# Patient Record
Sex: Male | Born: 1950
Health system: Southern US, Community
[De-identification: ages and names within clinical notes are randomized; demographics above are authoritative.]

## PROBLEM LIST (undated history)

## (undated) DIAGNOSIS — R0600 Dyspnea, unspecified: Secondary | ICD-10-CM

## (undated) DIAGNOSIS — Z8489 Family history of other specified conditions: Secondary | ICD-10-CM

## (undated) DIAGNOSIS — R05 Cough: Secondary | ICD-10-CM

## (undated) DIAGNOSIS — I700 Atherosclerosis of aorta: Secondary | ICD-10-CM

## (undated) DIAGNOSIS — I10 Essential (primary) hypertension: Secondary | ICD-10-CM

## (undated) DIAGNOSIS — E079 Disorder of thyroid, unspecified: Secondary | ICD-10-CM

## (undated) DIAGNOSIS — Z8719 Personal history of other diseases of the digestive system: Secondary | ICD-10-CM

## (undated) DIAGNOSIS — T7840XA Allergy, unspecified, initial encounter: Secondary | ICD-10-CM

## (undated) DIAGNOSIS — E785 Hyperlipidemia, unspecified: Secondary | ICD-10-CM

## (undated) DIAGNOSIS — F32A Depression, unspecified: Secondary | ICD-10-CM

## (undated) DIAGNOSIS — F329 Major depressive disorder, single episode, unspecified: Secondary | ICD-10-CM

## (undated) DIAGNOSIS — R059 Cough, unspecified: Secondary | ICD-10-CM

## (undated) DIAGNOSIS — I209 Angina pectoris, unspecified: Secondary | ICD-10-CM

## (undated) DIAGNOSIS — I872 Venous insufficiency (chronic) (peripheral): Secondary | ICD-10-CM

## (undated) DIAGNOSIS — Z974 Presence of external hearing-aid: Secondary | ICD-10-CM

## (undated) DIAGNOSIS — I739 Peripheral vascular disease, unspecified: Secondary | ICD-10-CM

## (undated) DIAGNOSIS — K219 Gastro-esophageal reflux disease without esophagitis: Secondary | ICD-10-CM

## (undated) DIAGNOSIS — J3089 Other allergic rhinitis: Secondary | ICD-10-CM

## (undated) DIAGNOSIS — K432 Incisional hernia without obstruction or gangrene: Secondary | ICD-10-CM

## (undated) DIAGNOSIS — H269 Unspecified cataract: Secondary | ICD-10-CM

## (undated) DIAGNOSIS — M1990 Unspecified osteoarthritis, unspecified site: Secondary | ICD-10-CM

## (undated) DIAGNOSIS — E039 Hypothyroidism, unspecified: Secondary | ICD-10-CM

## (undated) DIAGNOSIS — F419 Anxiety disorder, unspecified: Secondary | ICD-10-CM

## (undated) DIAGNOSIS — J449 Chronic obstructive pulmonary disease, unspecified: Secondary | ICD-10-CM

## (undated) DIAGNOSIS — R42 Dizziness and giddiness: Secondary | ICD-10-CM

## (undated) HISTORY — DX: Unspecified cataract: H26.9

## (undated) HISTORY — PX: HERNIA REPAIR: SHX51

## (undated) HISTORY — DX: Venous insufficiency (chronic) (peripheral): I87.2

## (undated) HISTORY — DX: Gastro-esophageal reflux disease without esophagitis: K21.9

## (undated) HISTORY — PX: CHOLECYSTECTOMY: SHX55

## (undated) HISTORY — DX: Anxiety disorder, unspecified: F41.9

## (undated) HISTORY — DX: Allergy, unspecified, initial encounter: T78.40XA

## (undated) HISTORY — DX: Hyperlipidemia, unspecified: E78.5

## (undated) HISTORY — DX: Essential (primary) hypertension: I10

## (undated) HISTORY — DX: Incisional hernia without obstruction or gangrene: K43.2

## (undated) HISTORY — DX: Atherosclerosis of aorta: I70.0

## (undated) HISTORY — DX: Depression, unspecified: F32.A

## (undated) HISTORY — PX: VEIN SURGERY: SHX48

## (undated) HISTORY — DX: Peripheral vascular disease, unspecified: I73.9

## (undated) HISTORY — DX: Disorder of thyroid, unspecified: E07.9

## (undated) HISTORY — DX: Major depressive disorder, single episode, unspecified: F32.9

## (undated) SURGERY — Surgical Case
Anesthesia: *Unknown

---

## 2004-01-28 ENCOUNTER — Ambulatory Visit: Payer: Self-pay | Admitting: Specialist

## 2004-04-08 ENCOUNTER — Ambulatory Visit: Payer: Self-pay | Admitting: *Deleted

## 2006-01-15 ENCOUNTER — Ambulatory Visit: Payer: Self-pay | Admitting: Family Medicine

## 2006-04-27 DIAGNOSIS — I2510 Atherosclerotic heart disease of native coronary artery without angina pectoris: Secondary | ICD-10-CM

## 2006-04-27 HISTORY — DX: Atherosclerotic heart disease of native coronary artery without angina pectoris: I25.10

## 2007-03-21 ENCOUNTER — Ambulatory Visit: Payer: Self-pay | Admitting: Vascular Surgery

## 2007-06-30 ENCOUNTER — Ambulatory Visit: Payer: Self-pay | Admitting: Gastroenterology

## 2008-06-19 ENCOUNTER — Ambulatory Visit: Payer: Self-pay | Admitting: Family Medicine

## 2008-09-05 ENCOUNTER — Ambulatory Visit: Payer: Self-pay | Admitting: Family Medicine

## 2008-10-30 ENCOUNTER — Ambulatory Visit: Payer: Self-pay | Admitting: Family Medicine

## 2008-11-14 ENCOUNTER — Ambulatory Visit: Payer: Self-pay | Admitting: Gastroenterology

## 2010-01-24 ENCOUNTER — Emergency Department: Payer: Self-pay | Admitting: Emergency Medicine

## 2010-07-24 ENCOUNTER — Ambulatory Visit: Payer: Self-pay | Admitting: Family Medicine

## 2012-02-28 ENCOUNTER — Emergency Department: Payer: Self-pay | Admitting: Internal Medicine

## 2013-04-27 HISTORY — PX: COLONOSCOPY: SHX5424

## 2013-08-12 ENCOUNTER — Ambulatory Visit: Payer: Self-pay | Admitting: Family Medicine

## 2015-01-16 ENCOUNTER — Ambulatory Visit (INDEPENDENT_AMBULATORY_CARE_PROVIDER_SITE_OTHER): Payer: BLUE CROSS/BLUE SHIELD | Admitting: Family Medicine

## 2015-01-16 ENCOUNTER — Encounter: Payer: Self-pay | Admitting: Family Medicine

## 2015-01-16 VITALS — BP 100/80 | HR 70 | Ht 68.0 in | Wt 153.0 lb

## 2015-01-16 DIAGNOSIS — J0100 Acute maxillary sinusitis, unspecified: Secondary | ICD-10-CM

## 2015-01-16 MED ORDER — AMOXICILLIN-POT CLAVULANATE 875-125 MG PO TABS: 1.0000 | ORAL_TABLET | Freq: Two times a day (BID) | ORAL | Status: DC

## 2015-01-16 NOTE — Progress Notes (Signed)
Name: Joel Flores   MRN: [id number]    DOB: 04-May-1950   Date:01/16/2015       Progress Note  Subjective  Chief Complaint  Chief Complaint  Patient presents with  . Sinusitis    drainage- cough productive- yellow    Sinusitis This is a chronic problem. The current episode started in the past 7 days. The problem has been gradually worsening since onset. There has been no fever. His pain is at a severity of 3/10. The pain is mild. Associated symptoms include congestion, coughing, ear pain, headaches, sinus pressure and swollen glands. Pertinent negatives include no chills, diaphoresis, neck pain, shortness of breath, sneezing or sore throat. Past treatments include acetaminophen. The treatment provided no relief.  Dizziness This is a new problem. The current episode started in the past 7 days. The problem occurs intermittently. Associated symptoms include congestion, coughing, headaches, swollen glands and vertigo. Pertinent negatives include no abdominal pain, chest pain, chills, diaphoresis, fever, myalgias, nausea, neck pain, rash or sore throat.    No problem-specific assessment & plan notes found for this encounter.   Past Medical History  Diagnosis Date  . Anxiety   . Depression   . Hyperlipidemia   . Hypertension   . Venous insufficiency   . GERD (gastroesophageal reflux disease)   . Thyroid disease     Past Surgical History  Procedure Laterality Date  . Vein surgery      2 stents in R) leg  . Colonoscopy  2015    Dr Allen Norris- cleared for 5 years     Family History  Problem Relation Age of Onset  . Diabetes Mother   . Cancer Father   . Heart disease Father   . Stroke Father     Social History   Social History  . Marital Status: Married    Spouse Name: N/A  . Number of Children: N/A  . Years of Education: N/A   Occupational History  . Not on file.   Social History Main Topics  . Smoking status: Current Every Day Smoker  . Smokeless tobacco: Not on file  .  Alcohol Use: 0.0 oz/week    0 Standard drinks or equivalent per week  . Drug Use: No  . Sexual Activity: No   Other Topics Concern  . Not on file   Social History Narrative  . No narrative on file    Allergies  Allergen Reactions  . Sulfa Antibiotics      Review of Systems  Constitutional: Negative for fever, chills, weight loss, malaise/fatigue and diaphoresis.  HENT: Positive for congestion, ear pain and sinus pressure. Negative for ear discharge, sneezing and sore throat.   Eyes: Negative for blurred vision.  Respiratory: Positive for cough. Negative for sputum production, shortness of breath and wheezing.   Cardiovascular: Negative for chest pain, palpitations and leg swelling.  Gastrointestinal: Negative for heartburn, nausea, abdominal pain, diarrhea, constipation, blood in stool and melena.  Genitourinary: Negative for dysuria, urgency, frequency and hematuria.  Musculoskeletal: Negative for myalgias, back pain, joint pain and neck pain.  Skin: Negative for rash.  Neurological: Positive for dizziness, vertigo and headaches. Negative for tingling, sensory change and focal weakness.  Endo/Heme/Allergies: Negative for environmental allergies and polydipsia. Does not bruise/bleed easily.  Psychiatric/Behavioral: Negative for depression and suicidal ideas. The patient is not nervous/anxious and does not have insomnia.      Objective  Filed Vitals:   01/16/15 1029  BP: 100/80  Pulse: 70  Height: 5\' 8"  (  1.727 m)  Weight: 153 lb (69.4 kg)    Physical Exam  Constitutional: He is oriented to person, place, and time and well-developed, well-nourished, and in no distress.  HENT:  Head: Normocephalic.  Right Ear: External ear normal.  Left Ear: External ear normal.  Nose: Nose normal.  Mouth/Throat: Oropharynx is clear and moist.  Eyes: Conjunctivae and EOM are normal. Pupils are equal, round, and reactive to light. Right eye exhibits no discharge. Left eye exhibits no  discharge. No scleral icterus.  Neck: Normal range of motion. Neck supple. No JVD present. No tracheal deviation present. No thyromegaly present.  Cardiovascular: Normal rate, regular rhythm, normal heart sounds and intact distal pulses.  Exam reveals no gallop and no friction rub.   No murmur heard. Pulmonary/Chest: Breath sounds normal. No respiratory distress. He has no wheezes. He has no rales.  Abdominal: Soft. Bowel sounds are normal. He exhibits no mass. There is no hepatosplenomegaly. There is no tenderness. There is no rebound, no guarding and no CVA tenderness.  Musculoskeletal: Normal range of motion. He exhibits no edema or tenderness.  Lymphadenopathy:    He has no cervical adenopathy.  Neurological: He is alert and oriented to person, place, and time. He has normal sensation, normal strength, normal reflexes and intact cranial nerves. No cranial nerve deficit.  Skin: Skin is warm. No rash noted.  Psychiatric: Mood and affect normal.  Nursing note and vitals reviewed.     Assessment & Plan  Problem List Items Addressed This Visit    None    Visit Diagnoses    Acute maxillary sinusitis, recurrence not specified    -  Primary    Relevant Medications    loratadine (CLARITIN) 10 MG tablet    amoxicillin-clavulanate (AUGMENTIN) 875-125 MG per tablet         Dr. [facility]  01/16/2015

## 2015-01-31 ENCOUNTER — Other Ambulatory Visit: Payer: Self-pay

## 2015-01-31 DIAGNOSIS — J40 Bronchitis, not specified as acute or chronic: Secondary | ICD-10-CM

## 2015-01-31 MED ORDER — LEVOFLOXACIN 500 MG PO TABS: 500.0000 mg | ORAL_TABLET | Freq: Every day | ORAL | Status: DC

## 2015-02-12 ENCOUNTER — Encounter: Payer: Self-pay | Admitting: Family Medicine

## 2015-02-12 ENCOUNTER — Ambulatory Visit (INDEPENDENT_AMBULATORY_CARE_PROVIDER_SITE_OTHER): Payer: BLUE CROSS/BLUE SHIELD | Admitting: Family Medicine

## 2015-02-12 VITALS — BP 120/84 | HR 86 | Ht 68.0 in | Wt 174.0 lb

## 2015-02-12 DIAGNOSIS — I10 Essential (primary) hypertension: Secondary | ICD-10-CM | POA: Diagnosis not present

## 2015-02-12 DIAGNOSIS — I700 Atherosclerosis of aorta: Secondary | ICD-10-CM

## 2015-02-12 DIAGNOSIS — K219 Gastro-esophageal reflux disease without esophagitis: Secondary | ICD-10-CM | POA: Diagnosis not present

## 2015-02-12 DIAGNOSIS — F329 Major depressive disorder, single episode, unspecified: Secondary | ICD-10-CM

## 2015-02-12 DIAGNOSIS — E785 Hyperlipidemia, unspecified: Secondary | ICD-10-CM

## 2015-02-12 DIAGNOSIS — E039 Hypothyroidism, unspecified: Secondary | ICD-10-CM | POA: Diagnosis not present

## 2015-02-12 DIAGNOSIS — F32A Depression, unspecified: Secondary | ICD-10-CM | POA: Insufficient documentation

## 2015-02-12 DIAGNOSIS — F419 Anxiety disorder, unspecified: Secondary | ICD-10-CM | POA: Insufficient documentation

## 2015-02-12 HISTORY — DX: Atherosclerosis of aorta: I70.0

## 2015-02-12 HISTORY — DX: Essential (primary) hypertension: I10

## 2015-02-12 MED ORDER — LEVOTHYROXINE SODIUM 100 MCG PO TABS: 100.0000 ug | ORAL_TABLET | Freq: Every day | ORAL | Status: DC

## 2015-02-12 MED ORDER — LOSARTAN POTASSIUM 50 MG PO TABS: 50.0000 mg | ORAL_TABLET | Freq: Every day | ORAL | Status: DC

## 2015-02-12 MED ORDER — FENOFIBRIC ACID 135 MG PO CPDR: 1.0000 | DELAYED_RELEASE_CAPSULE | Freq: Every day | ORAL | Status: DC

## 2015-02-12 MED ORDER — CLOPIDOGREL BISULFATE 75 MG PO TABS: 75.0000 mg | ORAL_TABLET | Freq: Every day | ORAL | Status: DC

## 2015-02-12 MED ORDER — SERTRALINE HCL 100 MG PO TABS: 100.0000 mg | ORAL_TABLET | Freq: Every day | ORAL | Status: DC

## 2015-02-12 MED ORDER — SIMVASTATIN 80 MG PO TABS: 80.0000 mg | ORAL_TABLET | Freq: Every day | ORAL | Status: DC

## 2015-02-12 MED ORDER — RABEPRAZOLE SODIUM 20 MG PO TBEC: 20.0000 mg | DELAYED_RELEASE_TABLET | Freq: Every day | ORAL | Status: DC

## 2015-02-12 MED ORDER — CLONAZEPAM 0.5 MG PO TABS: 0.5000 mg | ORAL_TABLET | Freq: Every day | ORAL | Status: DC

## 2015-02-12 NOTE — Progress Notes (Signed)
Name: Joel Flores   MRN: [id number]    DOB: 09-07-1950   Date:02/12/2015       Progress Note  Subjective  Chief Complaint  Chief Complaint  Patient presents with  . Depression  . Anxiety  . Hypertension  . Hyperlipidemia  . Gastroesophageal Reflux  . Allergic Rhinitis   . Hypothyroidism    Depression      The patient presents with depression.  This is a chronic problem.  The current episode started more than 1 year ago.   The onset quality is gradual.   The problem occurs daily.  The problem has been gradually improving since onset.  Associated symptoms include no decreased concentration, no fatigue, no helplessness, no hopelessness, does not have insomnia, not irritable, no restlessness, no decreased interest, no appetite change, no body aches, no myalgias, no headaches, no indigestion, not sad and no suicidal ideas.     The symptoms are aggravated by nothing.  Past treatments include SSRIs - Selective serotonin reuptake inhibitors.  Compliance with treatment is good.  Past medical history includes thyroid problem, anxiety and depression.     Pertinent negatives include no chronic fatigue syndrome and no suicide attempts. Anxiety Presents for follow-up visit. Symptoms include nervous/anxious behavior. Patient reports no chest pain, decreased concentration, depressed mood, dizziness, excessive worry, hyperventilation, insomnia, nausea, palpitations, restlessness, shortness of breath or suicidal ideas. Symptoms occur occasionally. The severity of symptoms is mild. Nothing aggravates the symptoms.   His past medical history is significant for anxiety/panic attacks and depression. There is no history of anemia, arrhythmia, asthma, bipolar disorder, CAD, CHF, chronic lung disease, fibromyalgia, hyperthyroidism or suicide attempts. Past treatments include benzodiazephines and SSRIs.  Hypertension This is a recurrent problem. The current episode started more than 1 year ago. The problem has been  waxing and waning since onset. The problem is controlled. Associated symptoms include anxiety. Pertinent negatives include no blurred vision, chest pain, headaches, malaise/fatigue, neck pain, orthopnea, palpitations, peripheral edema, PND, shortness of breath or sweats. There are no associated agents to hypertension. Past treatments include angiotensin blockers. The current treatment provides no improvement. There are no compliance problems.  Hypertensive end-organ damage includes a thyroid problem. There is no history of angina, kidney disease, CAD/MI, CVA, heart failure, left ventricular hypertrophy, PVD, renovascular disease or retinopathy. There is no history of chronic renal disease.  Hyperlipidemia This is a recurrent problem. The current episode started more than 1 month ago. The problem is controlled. Recent lipid tests were reviewed and are normal. He has no history of chronic renal disease or diabetes. Pertinent negatives include no chest pain, focal sensory loss, focal weakness, leg pain, myalgias or shortness of breath. Current antihyperlipidemic treatment includes statins. The current treatment provides mild improvement of lipids. There are no compliance problems.   Gastroesophageal Reflux He reports no abdominal pain, no belching, no chest pain, no choking, no coughing, no dysphagia, no heartburn, no hoarse voice, no nausea, no sore throat or no wheezing. This is a recurrent problem. The current episode started more than 1 year ago. The problem occurs frequently. The problem has been gradually improving. The symptoms are aggravated by certain foods. Pertinent negatives include no anemia, fatigue, melena, muscle weakness, orthopnea or weight loss. He has tried a PPI for the symptoms. The treatment provided moderate relief.  Thyroid Problem Presents for follow-up visit. Symptoms include anxiety. Patient reports no cold intolerance, constipation, depressed mood, diaphoresis, diarrhea, dry skin,  fatigue, hair loss, heat intolerance, hoarse voice, leg swelling,  menstrual problem, nail problem, palpitations, tremors, visual change, weight gain or weight loss. The symptoms have been stable. Past treatments include levothyroxine. The treatment provided mild relief. His past medical history is significant for hyperlipidemia and obesity. There is no history of atrial fibrillation, dementia, diabetes, heart failure or neuropathy.    No problem-specific assessment & plan notes found for this encounter.   Past Medical History  Diagnosis Date  . Anxiety   . Depression   . Hyperlipidemia   . Hypertension   . Venous insufficiency   . GERD (gastroesophageal reflux disease)   . Thyroid disease     Past Surgical History  Procedure Laterality Date  . Vein surgery      2 stents in R) leg  . Colonoscopy  2015    Dr Allen Norris- cleared for 5 years     Family History  Problem Relation Age of Onset  . Diabetes Mother   . Cancer Father   . Heart disease Father   . Stroke Father     Social History   Social History  . Marital Status: Married    Spouse Name: N/A  . Number of Children: N/A  . Years of Education: N/A   Occupational History  . Not on file.   Social History Main Topics  . Smoking status: Current Every Day Smoker  . Smokeless tobacco: Not on file  . Alcohol Use: 0.0 oz/week    0 Standard drinks or equivalent per week  . Drug Use: No  . Sexual Activity: No   Other Topics Concern  . Not on file   Social History Narrative    Allergies  Allergen Reactions  . Sulfa Antibiotics      Review of Systems  Constitutional: Negative for fever, chills, weight loss, weight gain, malaise/fatigue, diaphoresis, appetite change and fatigue.  HENT: Negative for ear discharge, ear pain, hoarse voice and sore throat.   Eyes: Negative for blurred vision.  Respiratory: Negative for cough, sputum production, choking, shortness of breath and wheezing.   Cardiovascular: Negative for  chest pain, palpitations, orthopnea, leg swelling and PND.  Gastrointestinal: Negative for heartburn, dysphagia, nausea, abdominal pain, diarrhea, constipation, blood in stool and melena.  Genitourinary: Negative for dysuria, urgency, frequency, hematuria and menstrual problem.  Musculoskeletal: Negative for myalgias, back pain, joint pain, muscle weakness and neck pain.  Skin: Negative for rash.  Neurological: Negative for dizziness, tingling, tremors, sensory change, focal weakness and headaches.  Endo/Heme/Allergies: Negative for environmental allergies, cold intolerance, heat intolerance and polydipsia. Does not bruise/bleed easily.  Psychiatric/Behavioral: Positive for depression. Negative for suicidal ideas and decreased concentration. The patient is nervous/anxious. The patient does not have insomnia.      Objective  Filed Vitals:   02/12/15 1346  BP: 120/84  Pulse: 86  Height: 5\' 8"  (1.727 m)  Weight: 174 lb (78.926 kg)    Physical Exam  Constitutional: He is oriented to person, place, and time and well-developed, well-nourished, and in no distress. He is not irritable.  HENT:  Head: Normocephalic.  Right Ear: External ear normal.  Left Ear: External ear normal.  Nose: Nose normal.  Mouth/Throat: Oropharynx is clear and moist.  Eyes: Conjunctivae and EOM are normal. Pupils are equal, round, and reactive to light. Right eye exhibits no discharge. Left eye exhibits no discharge. No scleral icterus.  Neck: Normal range of motion. Neck supple. No JVD present. No tracheal deviation present. No thyromegaly present.  Cardiovascular: Normal rate, regular rhythm, normal heart sounds and intact distal  pulses.  Exam reveals no gallop and no friction rub.   No murmur heard. Pulmonary/Chest: Breath sounds normal. No respiratory distress. He has no wheezes. He has no rales.  Abdominal: Soft. Bowel sounds are normal. He exhibits no mass. There is no hepatosplenomegaly. There is no  tenderness. There is no rebound, no guarding and no CVA tenderness.  Musculoskeletal: Normal range of motion. He exhibits no edema or tenderness.  Lymphadenopathy:    He has no cervical adenopathy.  Neurological: He is alert and oriented to person, place, and time. He has normal sensation, normal strength, normal reflexes and intact cranial nerves. No cranial nerve deficit.  Skin: Skin is warm. No rash noted.  Psychiatric: Mood and affect normal.      Assessment & Plan  Problem List Items Addressed This Visit      Cardiovascular and Mediastinum   Atherosclerosis of aorta (HCC)   Relevant Medications   clopidogrel (PLAVIX) 75 MG tablet   losartan (COZAAR) 50 MG tablet   simvastatin (ZOCOR) 80 MG tablet   Essential hypertension - Primary   Relevant Medications   losartan (COZAAR) 50 MG tablet   simvastatin (ZOCOR) 80 MG tablet   Other Relevant Orders   Renal Function Panel     Digestive   Esophageal reflux   Relevant Medications   RABEprazole (ACIPHEX) 20 MG tablet     Endocrine   Thyroid activity decreased   Relevant Medications   levothyroxine (SYNTHROID, LEVOTHROID) 100 MCG tablet   Other Relevant Orders   TSH     Other   Depression   Relevant Medications   sertraline (ZOLOFT) 100 MG tablet   Chronic anxiety   Relevant Medications   clonazePAM (KLONOPIN) 0.5 MG tablet   sertraline (ZOLOFT) 100 MG tablet   Hyperlipemia   Relevant Medications   losartan (COZAAR) 50 MG tablet   simvastatin (ZOCOR) 80 MG tablet   Other Relevant Orders   Lipid Profile   TSH        Dr. Deanna Jones [facility]  02/12/2015

## 2015-02-13 LAB — RENAL FUNCTION PANEL
ALBUMIN: 4.3 g/dL (ref 3.6–4.8)
BUN/Creatinine Ratio: 14 (ref 10–22)
BUN: 13 mg/dL (ref 8–27)
CALCIUM: 9.8 mg/dL (ref 8.6–10.2)
CHLORIDE: 103 mmol/L (ref 97–106)
CO2: 24 mmol/L (ref 18–29)
Creatinine, Ser: 0.96 mg/dL (ref 0.76–1.27)
GFR calc Af Amer: 96 mL/min/{1.73_m2} (ref >59)
GFR calc non Af Amer: 83 mL/min/{1.73_m2} (ref >59)
GLUCOSE: 78 mg/dL (ref 65–99)
PHOSPHORUS: 2.9 mg/dL (ref 2.5–4.5)
POTASSIUM: 4.9 mmol/L (ref 3.5–5.2)
Sodium: 141 mmol/L (ref 136–144)

## 2015-02-13 LAB — LIPID PANEL
CHOLESTEROL TOTAL: 160 mg/dL (ref 100–199)
Chol/HDL Ratio: 4.1 ratio units (ref 0.0–5.0)
HDL: 39 mg/dL — AB (ref >39)
LDL Calculated: 101 mg/dL — ABNORMAL HIGH (ref 0–99)
TRIGLYCERIDES: 98 mg/dL (ref 0–149)
VLDL CHOLESTEROL CAL: 20 mg/dL (ref 5–40)

## 2015-02-13 LAB — TSH: TSH: 3 u[IU]/mL (ref 0.450–4.500)

## 2015-02-19 ENCOUNTER — Other Ambulatory Visit: Payer: Self-pay

## 2015-02-19 DIAGNOSIS — F32A Depression, unspecified: Secondary | ICD-10-CM

## 2015-02-19 DIAGNOSIS — F329 Major depressive disorder, single episode, unspecified: Secondary | ICD-10-CM

## 2015-02-19 MED ORDER — SERTRALINE HCL 100 MG PO TABS: 100.0000 mg | ORAL_TABLET | Freq: Every day | ORAL | Status: DC

## 2015-05-16 ENCOUNTER — Other Ambulatory Visit: Payer: Self-pay

## 2015-05-16 DIAGNOSIS — F329 Major depressive disorder, single episode, unspecified: Secondary | ICD-10-CM

## 2015-05-16 DIAGNOSIS — K219 Gastro-esophageal reflux disease without esophagitis: Secondary | ICD-10-CM

## 2015-05-16 DIAGNOSIS — F32A Depression, unspecified: Secondary | ICD-10-CM

## 2015-05-16 MED ORDER — RABEPRAZOLE SODIUM 20 MG PO TBEC: 20.0000 mg | DELAYED_RELEASE_TABLET | Freq: Every day | ORAL | Status: DC

## 2015-05-16 MED ORDER — SERTRALINE HCL 100 MG PO TABS: 100.0000 mg | ORAL_TABLET | Freq: Every day | ORAL | Status: DC

## 2015-05-20 ENCOUNTER — Other Ambulatory Visit: Payer: Self-pay

## 2015-06-06 ENCOUNTER — Other Ambulatory Visit: Payer: Self-pay

## 2015-06-06 DIAGNOSIS — K219 Gastro-esophageal reflux disease without esophagitis: Secondary | ICD-10-CM

## 2015-06-06 MED ORDER — RABEPRAZOLE SODIUM 20 MG PO TBEC
20.0000 mg | DELAYED_RELEASE_TABLET | Freq: Every day | ORAL | Status: DC
Start: 2015-06-06 — End: 2015-06-10

## 2015-06-10 ENCOUNTER — Other Ambulatory Visit: Payer: Self-pay

## 2015-06-10 DIAGNOSIS — K219 Gastro-esophageal reflux disease without esophagitis: Secondary | ICD-10-CM

## 2015-06-10 MED ORDER — DEXLANSOPRAZOLE 60 MG PO CPDR: 60.0000 mg | DELAYED_RELEASE_CAPSULE | Freq: Every day | ORAL | Status: DC

## 2015-07-05 ENCOUNTER — Other Ambulatory Visit: Payer: Self-pay | Admitting: Family Medicine

## 2015-07-09 ENCOUNTER — Other Ambulatory Visit: Payer: Self-pay

## 2015-07-09 DIAGNOSIS — K219 Gastro-esophageal reflux disease without esophagitis: Secondary | ICD-10-CM

## 2015-07-09 MED ORDER — DEXLANSOPRAZOLE 60 MG PO CPDR: 60.0000 mg | DELAYED_RELEASE_CAPSULE | Freq: Every day | ORAL | Status: DC

## 2015-07-10 ENCOUNTER — Other Ambulatory Visit: Payer: Self-pay

## 2015-07-11 ENCOUNTER — Other Ambulatory Visit: Payer: Self-pay

## 2015-07-11 ENCOUNTER — Other Ambulatory Visit: Payer: Self-pay | Admitting: Family Medicine

## 2015-08-06 ENCOUNTER — Other Ambulatory Visit: Payer: Self-pay | Admitting: Family Medicine

## 2015-08-12 ENCOUNTER — Other Ambulatory Visit: Payer: Self-pay

## 2015-08-13 ENCOUNTER — Ambulatory Visit (INDEPENDENT_AMBULATORY_CARE_PROVIDER_SITE_OTHER): Payer: Medicare Other | Admitting: Family Medicine

## 2015-08-13 ENCOUNTER — Encounter: Payer: Self-pay | Admitting: Family Medicine

## 2015-08-13 VITALS — BP 120/80 | HR 82 | Ht 68.0 in | Wt 176.0 lb

## 2015-08-13 DIAGNOSIS — I10 Essential (primary) hypertension: Secondary | ICD-10-CM | POA: Diagnosis not present

## 2015-08-13 DIAGNOSIS — R351 Nocturia: Secondary | ICD-10-CM

## 2015-08-13 DIAGNOSIS — K219 Gastro-esophageal reflux disease without esophagitis: Secondary | ICD-10-CM

## 2015-08-13 DIAGNOSIS — Z1211 Encounter for screening for malignant neoplasm of colon: Secondary | ICD-10-CM | POA: Diagnosis not present

## 2015-08-13 DIAGNOSIS — F419 Anxiety disorder, unspecified: Secondary | ICD-10-CM

## 2015-08-13 DIAGNOSIS — E039 Hypothyroidism, unspecified: Secondary | ICD-10-CM | POA: Diagnosis not present

## 2015-08-13 DIAGNOSIS — F329 Major depressive disorder, single episode, unspecified: Secondary | ICD-10-CM

## 2015-08-13 DIAGNOSIS — I700 Atherosclerosis of aorta: Secondary | ICD-10-CM

## 2015-08-13 DIAGNOSIS — E785 Hyperlipidemia, unspecified: Secondary | ICD-10-CM

## 2015-08-13 DIAGNOSIS — J301 Allergic rhinitis due to pollen: Secondary | ICD-10-CM | POA: Insufficient documentation

## 2015-08-13 DIAGNOSIS — F32A Depression, unspecified: Secondary | ICD-10-CM

## 2015-08-13 LAB — HEMOCCULT GUIAC POC 1CARD (OFFICE): Fecal Occult Blood, POC: NEGATIVE

## 2015-08-13 MED ORDER — CLONAZEPAM 0.5 MG PO TABS: 0.5000 mg | ORAL_TABLET | Freq: Every day | ORAL | Status: DC

## 2015-08-13 MED ORDER — LEVOTHYROXINE SODIUM 100 MCG PO TABS: ORAL_TABLET | ORAL | Status: DC

## 2015-08-13 MED ORDER — DEXLANSOPRAZOLE 60 MG PO CPDR: DELAYED_RELEASE_CAPSULE | ORAL | Status: DC

## 2015-08-13 MED ORDER — SERTRALINE HCL 100 MG PO TABS
ORAL_TABLET | ORAL | Status: DC
Start: 2015-08-13 — End: 2016-01-07

## 2015-08-13 MED ORDER — SIMVASTATIN 80 MG PO TABS: ORAL_TABLET | ORAL | Status: DC

## 2015-08-13 MED ORDER — CLOPIDOGREL BISULFATE 75 MG PO TABS: ORAL_TABLET | ORAL | Status: DC

## 2015-08-13 MED ORDER — LOSARTAN POTASSIUM 50 MG PO TABS: ORAL_TABLET | ORAL | Status: DC

## 2015-08-13 MED ORDER — FENOFIBRIC ACID 135 MG PO CPDR: DELAYED_RELEASE_CAPSULE | ORAL | Status: DC

## 2015-08-13 MED ORDER — LORATADINE 10 MG PO TABS: 10.0000 mg | ORAL_TABLET | Freq: Every day | ORAL | Status: DC

## 2015-08-13 NOTE — Progress Notes (Signed)
Name: Joel Flores   MRN: [id number]    DOB: 12-10-1950   Date:08/13/2015       Progress Note  Subjective  Chief Complaint  Chief Complaint  Patient presents with  . Gastroesophageal Reflux  . Hyperlipidemia  . Anxiety  . Hypothyroidism  . Allergic Rhinitis   . Hypertension  . Depression    Gastroesophageal Reflux He reports no abdominal pain, no belching, no chest pain, no choking, no coughing, no dysphagia, no early satiety, no globus sensation, no heartburn, no hoarse voice, no nausea, no sore throat, no stridor, no tooth decay, no water brash or no wheezing. This is a new problem. The current episode started more than 1 year ago. The problem occurs frequently. The problem has been unchanged. The symptoms are aggravated by certain foods. Pertinent negatives include no anemia, fatigue, melena, muscle weakness, orthopnea or weight loss. There are no known risk factors. He has tried a PPI for the symptoms. The treatment provided mild relief.  Hyperlipidemia This is a chronic problem. The current episode started more than 1 year ago. The problem is controlled. Recent lipid tests were reviewed and are normal. He has no history of chronic renal disease, diabetes, hypothyroidism, liver disease, obesity or nephrotic syndrome. There are no known factors aggravating his hyperlipidemia. Pertinent negatives include no chest pain, focal sensory loss, focal weakness, leg pain, myalgias or shortness of breath. Current antihyperlipidemic treatment includes statins and fibric acid derivatives. The current treatment provides mild improvement of lipids. There are no compliance problems.  Risk factors for coronary artery disease include dyslipidemia, male sex and hypertension.  Anxiety Presents for follow-up visit. The problem has been gradually improving. Symptoms include panic. Patient reports no chest pain, compulsions, confusion, decreased concentration, depressed mood, dizziness, dry mouth, excessive worry,  feeling of choking, hyperventilation, impotence, insomnia, irritability, malaise, muscle tension, nausea, nervous/anxious behavior, obsessions, palpitations, restlessness, shortness of breath or suicidal ideas. Symptoms occur most days. The severity of symptoms is mild. Exacerbated by: people doing stupid things. The quality of sleep is good.   His past medical history is significant for anxiety/panic attacks. Past treatments include SSRIs and benzodiazephines. The treatment provided mild relief.  Hypertension This is a chronic problem. The problem has been gradually improving since onset. The problem is controlled. Associated symptoms include anxiety. Pertinent negatives include no blurred vision, chest pain, headaches, malaise/fatigue, neck pain, orthopnea, palpitations, peripheral edema, PND, shortness of breath or sweats. Past treatments include angiotensin blockers. The current treatment provides mild improvement. There are no compliance problems.  There is no history of angina, kidney disease, CAD/MI, CVA, heart failure, left ventricular hypertrophy, PVD, renovascular disease or retinopathy. There is no history of chronic renal disease or a hypertension causing med.  Depression        This is a chronic problem.  The current episode started more than 1 year ago.   The onset quality is gradual.   The problem has been gradually improving since onset.  Associated symptoms include irritable.  Associated symptoms include no decreased concentration, no fatigue, no helplessness, no hopelessness, does not have insomnia, no restlessness, no decreased interest, no appetite change, no body aches, no myalgias, no headaches, no indigestion, not sad and no suicidal ideas.  Past treatments include SSRIs - Selective serotonin reuptake inhibitors.  Past medical history includes anxiety.     Pertinent negatives include no hypothyroidism.   No problem-specific assessment & plan notes found for this encounter.   Past  Medical History  Diagnosis Date  .  Anxiety   . Depression   . Hyperlipidemia   . Hypertension   . Venous insufficiency   . GERD (gastroesophageal reflux disease)   . Thyroid disease     Past Surgical History  Procedure Laterality Date  . Vein surgery      2 stents in R) leg  . Colonoscopy  2015    Dr Allen Norris- cleared for 5 years     Family History  Problem Relation Age of Onset  . Diabetes Mother   . Cancer Father   . Heart disease Father   . Stroke Father     Social History   Social History  . Marital Status: Married    Spouse Name: N/A  . Number of Children: N/A  . Years of Education: N/A   Occupational History  . Not on file.   Social History Main Topics  . Smoking status: Current Every Day Smoker  . Smokeless tobacco: Not on file  . Alcohol Use: 0.0 oz/week    0 Standard drinks or equivalent per week  . Drug Use: No  . Sexual Activity: No   Other Topics Concern  . Not on file   Social History Narrative    Allergies  Allergen Reactions  . Sulfa Antibiotics      Review of Systems  Constitutional: Negative for fever, chills, weight loss, malaise/fatigue, appetite change, irritability and fatigue.  HENT: Negative for ear discharge, ear pain, hoarse voice and sore throat.   Eyes: Negative for blurred vision.  Respiratory: Negative for cough, sputum production, choking, shortness of breath and wheezing.   Cardiovascular: Negative for chest pain, palpitations, orthopnea, leg swelling and PND.  Gastrointestinal: Negative for heartburn, dysphagia, nausea, abdominal pain, diarrhea, constipation, blood in stool and melena.  Genitourinary: Negative for dysuria, urgency, frequency, hematuria and impotence.  Musculoskeletal: Negative for myalgias, back pain, joint pain, muscle weakness and neck pain.  Skin: Negative for rash.  Neurological: Negative for dizziness, tingling, sensory change, focal weakness and headaches.  Endo/Heme/Allergies: Negative for  environmental allergies and polydipsia. Does not bruise/bleed easily.  Psychiatric/Behavioral: Positive for depression. Negative for suicidal ideas, confusion and decreased concentration. The patient is not nervous/anxious and does not have insomnia.      Objective  Filed Vitals:   08/13/15 0843  BP: 120/80  Pulse: 82  Height: 5\' 8"  (1.727 m)  Weight: 176 lb (79.833 kg)    Physical Exam  Constitutional: He is oriented to person, place, and time and well-developed, well-nourished, and in no distress. He is irritable.  HENT:  Head: Normocephalic.  Right Ear: External ear normal.  Left Ear: External ear normal.  Nose: Nose normal.  Mouth/Throat: Oropharynx is clear and moist.  Eyes: Conjunctivae and EOM are normal. Pupils are equal, round, and reactive to light. Right eye exhibits no discharge. Left eye exhibits no discharge. No scleral icterus.  Neck: Normal range of motion. Neck supple. No JVD present. No tracheal deviation present. No thyromegaly present.  Cardiovascular: Normal rate, regular rhythm, normal heart sounds and intact distal pulses.  Exam reveals no gallop and no friction rub.   No murmur heard. Pulmonary/Chest: Breath sounds normal. No respiratory distress. He has no wheezes. He has no rales.  Abdominal: Soft. Bowel sounds are normal. He exhibits no mass. There is no hepatosplenomegaly. There is no tenderness. There is no rebound, no guarding and no CVA tenderness.  Genitourinary: Rectum normal and prostate normal.  Musculoskeletal: Normal range of motion. He exhibits no edema or tenderness.  Lymphadenopathy:  He has no cervical adenopathy.  Neurological: He is alert and oriented to person, place, and time. He has normal sensation, normal strength, normal reflexes and intact cranial nerves. No cranial nerve deficit.  Skin: Skin is warm. No rash noted.  Psychiatric: Mood and affect normal.  Nursing note and vitals reviewed.     Assessment & Plan  Problem List  Items Addressed This Visit      Cardiovascular and Mediastinum   Atherosclerosis of aorta (HCC)   Relevant Medications   Choline Fenofibrate (FENOFIBRIC ACID) 135 MG CPDR   clopidogrel (PLAVIX) 75 MG tablet   losartan (COZAAR) 50 MG tablet   simvastatin (ZOCOR) 80 MG tablet   Essential hypertension - Primary   Relevant Medications   Choline Fenofibrate (FENOFIBRIC ACID) 135 MG CPDR   losartan (COZAAR) 50 MG tablet   simvastatin (ZOCOR) 80 MG tablet   Other Relevant Orders   Renal function panel     Respiratory   Allergic rhinitis due to pollen   Relevant Medications   loratadine (CLARITIN) 10 MG tablet     Digestive   Esophageal reflux   Relevant Medications   dexlansoprazole (DEXILANT) 60 MG capsule     Endocrine   Thyroid activity decreased   Relevant Medications   levothyroxine (SYNTHROID, LEVOTHROID) 100 MCG tablet     Other   Depression   Relevant Medications   sertraline (ZOLOFT) 100 MG tablet   Chronic anxiety   Relevant Medications   sertraline (ZOLOFT) 100 MG tablet   clonazePAM (KLONOPIN) 0.5 MG tablet   Hyperlipemia   Relevant Medications   Choline Fenofibrate (FENOFIBRIC ACID) 135 MG CPDR   losartan (COZAAR) 50 MG tablet   simvastatin (ZOCOR) 80 MG tablet   Other Relevant Orders   Lipid Profile   Nocturia   Relevant Orders   PSA    Other Visit Diagnoses    Colon cancer screening        Relevant Orders    POCT Occult Blood Stool (Completed)         Dr. Sharleen Szczesny [facility]  08/13/2015

## 2015-08-14 LAB — RENAL FUNCTION PANEL
Albumin: 4.3 g/dL (ref 3.6–4.8)
BUN / CREAT RATIO: 23 (ref 10–24)
BUN: 22 mg/dL (ref 8–27)
CALCIUM: 9.2 mg/dL (ref 8.6–10.2)
CO2: 22 mmol/L (ref 18–29)
CREATININE: 0.97 mg/dL (ref 0.76–1.27)
Chloride: 102 mmol/L (ref 96–106)
GFR calc non Af Amer: 82 mL/min/{1.73_m2} (ref >59)
GFR, EST AFRICAN AMERICAN: 94 mL/min/{1.73_m2} (ref >59)
Glucose: 81 mg/dL (ref 65–99)
Phosphorus: 2.5 mg/dL (ref 2.5–4.5)
Potassium: 4.6 mmol/L (ref 3.5–5.2)
Sodium: 139 mmol/L (ref 134–144)

## 2015-08-14 LAB — LIPID PANEL
Chol/HDL Ratio: 3.9 ratio units (ref 0.0–5.0)
Cholesterol, Total: 145 mg/dL (ref 100–199)
HDL: 37 mg/dL — AB (ref >39)
LDL Calculated: 88 mg/dL (ref 0–99)
Triglycerides: 99 mg/dL (ref 0–149)
VLDL CHOLESTEROL CAL: 20 mg/dL (ref 5–40)

## 2015-08-14 LAB — PSA: Prostate Specific Ag, Serum: 0.6 ng/mL (ref 0.0–4.0)

## 2015-09-10 ENCOUNTER — Other Ambulatory Visit: Payer: Self-pay

## 2015-12-11 ENCOUNTER — Ambulatory Visit
Admission: RE | Admit: 2015-12-11 | Discharge: 2015-12-11 | Disposition: A | Discharge status: Home / Self Care | Payer: Medicare Other | Source: Ambulatory Visit | Attending: Family Medicine | Admitting: Family Medicine

## 2015-12-11 ENCOUNTER — Encounter: Payer: Self-pay | Admitting: Family Medicine

## 2015-12-11 ENCOUNTER — Ambulatory Visit (INDEPENDENT_AMBULATORY_CARE_PROVIDER_SITE_OTHER): Payer: Medicare Other | Admitting: Family Medicine

## 2015-12-11 VITALS — BP 120/64 | HR 82 | Ht 68.0 in | Wt 170.0 lb

## 2015-12-11 DIAGNOSIS — J4520 Mild intermittent asthma, uncomplicated: Secondary | ICD-10-CM

## 2015-12-11 DIAGNOSIS — J440 Chronic obstructive pulmonary disease with acute lower respiratory infection: Secondary | ICD-10-CM | POA: Insufficient documentation

## 2015-12-11 DIAGNOSIS — J4530 Mild persistent asthma, uncomplicated: Secondary | ICD-10-CM | POA: Diagnosis present

## 2015-12-11 DIAGNOSIS — J219 Acute bronchiolitis, unspecified: Secondary | ICD-10-CM

## 2015-12-11 MED ORDER — ALBUTEROL SULFATE HFA 108 (90 BASE) MCG/ACT IN AERS: 2.0000 | INHALATION_SPRAY | Freq: Four times a day (QID) | RESPIRATORY_TRACT | 0 refills | Status: DC | PRN

## 2015-12-11 MED ORDER — AMOXICILLIN-POT CLAVULANATE 875-125 MG PO TABS: 1.0000 | ORAL_TABLET | Freq: Two times a day (BID) | ORAL | 0 refills | Status: DC

## 2015-12-11 NOTE — Progress Notes (Signed)
**Note Joel-Identified via Obfuscation** Name: Joel Flores   MRN: [id number]    DOB: 1951/02/08   Date:12/11/2015       Progress Note  Subjective  Chief Complaint  Chief Complaint  Patient presents with  . Cough    x 3 weeks off and on- wakes up with head cong and will vomit from the thickness of the phlegm    Cough  This is a recurrent problem. The current episode started in the past 7 days. The problem has been waxing and waning. The cough is productive of sputum. Associated symptoms include shortness of breath and wheezing. Pertinent negatives include no chest pain, chills, ear congestion, ear pain, fever, headaches, heartburn, hemoptysis, myalgias, nasal congestion, postnasal drip, rash, rhinorrhea, sore throat, sweats or weight loss. Nothing ("change in weather") aggravates the symptoms. He has tried nothing for the symptoms. The treatment provided mild relief. His past medical history is significant for asthma and COPD. There is no history of bronchiectasis, bronchitis, emphysema, environmental allergies or pneumonia.    No problem-specific Assessment & Plan notes found for this encounter.   Past Medical History:  Diagnosis Date  . Anxiety   . Depression   . GERD (gastroesophageal reflux disease)   . Hyperlipidemia   . Hypertension   . Thyroid disease   . Venous insufficiency     Past Surgical History:  Procedure Laterality Date  . COLONOSCOPY  2015   Dr Allen Norris- cleared for 5 years   . VEIN SURGERY     2 stents in R) leg    Family History  Problem Relation Age of Onset  . Diabetes Mother   . Cancer Father   . Heart disease Father   . Stroke Father     Social History   Social History  . Marital status: Married    Spouse name: N/A  . Number of children: N/A  . Years of education: N/A   Occupational History  . Not on file.   Social History Main Topics  . Smoking status: Current Every Day Smoker  . Smokeless tobacco: Never Used  . Alcohol use 0.0 oz/week  . Drug use: No  . Sexual activity: No    Other Topics Concern  . Not on file   Social History Narrative  . No narrative on file    Allergies  Allergen Reactions  . Sulfa Antibiotics      Review of Systems  Constitutional: Negative for chills, fever, malaise/fatigue and weight loss.  HENT: Negative for ear discharge, ear pain, postnasal drip, rhinorrhea and sore throat.   Eyes: Negative for blurred vision.  Respiratory: Positive for cough, shortness of breath and wheezing. Negative for hemoptysis and sputum production.   Cardiovascular: Negative for chest pain, palpitations and leg swelling.  Gastrointestinal: Negative for abdominal pain, blood in stool, constipation, diarrhea, heartburn, melena and nausea.  Genitourinary: Negative for dysuria, frequency, hematuria and urgency.  Musculoskeletal: Negative for back pain, joint pain, myalgias and neck pain.  Skin: Negative for rash.  Neurological: Negative for dizziness, tingling, sensory change, focal weakness and headaches.  Endo/Heme/Allergies: Negative for environmental allergies and polydipsia. Does not bruise/bleed easily.  Psychiatric/Behavioral: Negative for depression and suicidal ideas. The patient is not nervous/anxious and does not have insomnia.      Objective  Vitals:   12/11/15 0808  BP: 120/64  Pulse: 82  Weight: 170 lb (77.1 kg)  Height: 5\' 8"  (1.727 m)    Physical Exam  Constitutional: He is oriented to person, place, and time and well-developed,  well-nourished, and in no distress.  HENT:  Head: Normocephalic.  Right Ear: External ear normal.  Left Ear: External ear normal.  Nose: Nose normal.  Mouth/Throat: Oropharynx is clear and moist.  Eyes: Conjunctivae and EOM are normal. Pupils are equal, round, and reactive to light. Right eye exhibits no discharge. Left eye exhibits no discharge. No scleral icterus.  Neck: Normal range of motion. Neck supple. No JVD present. No tracheal deviation present. No thyromegaly present.  Cardiovascular:  Normal rate, regular rhythm, normal heart sounds and intact distal pulses.  Exam reveals no gallop and no friction rub.   No murmur heard. Pulmonary/Chest: Breath sounds normal. No respiratory distress. He has no wheezes. He has no rales.  Abdominal: Soft. Bowel sounds are normal. He exhibits no mass. There is no hepatosplenomegaly. There is no tenderness. There is no rebound, no guarding and no CVA tenderness.  Musculoskeletal: Normal range of motion. He exhibits no edema or tenderness.  Lymphadenopathy:    He has no cervical adenopathy.  Neurological: He is alert and oriented to person, place, and time. He has normal sensation, normal strength, normal reflexes and intact cranial nerves. No cranial nerve deficit.  Skin: Skin is warm. No rash noted.  Psychiatric: Mood and affect normal.  Nursing note and vitals reviewed.     Assessment & Plan  Problem List Items Addressed This Visit    None    Visit Diagnoses    Bronchiolitis    -  Primary   Relevant Medications   amoxicillin-clavulanate (AUGMENTIN) 875-125 MG tablet   Other Relevant Orders   DG Chest 2 View (Completed)   Reactive airway disease, mild intermittent, uncomplicated       Relevant Medications   amoxicillin-clavulanate (AUGMENTIN) 875-125 MG tablet   albuterol (PROVENTIL HFA;VENTOLIN HFA) 108 (90 Base) MCG/ACT inhaler   Other Relevant Orders   DG Chest 2 View (Completed)        Dr. Deanna Jones [facility]  12/11/15

## 2016-01-01 ENCOUNTER — Other Ambulatory Visit: Payer: Self-pay

## 2016-01-07 ENCOUNTER — Other Ambulatory Visit: Payer: Self-pay | Admitting: Family Medicine

## 2016-01-07 DIAGNOSIS — K219 Gastro-esophageal reflux disease without esophagitis: Secondary | ICD-10-CM

## 2016-01-07 DIAGNOSIS — F329 Major depressive disorder, single episode, unspecified: Secondary | ICD-10-CM

## 2016-01-07 DIAGNOSIS — E039 Hypothyroidism, unspecified: Secondary | ICD-10-CM

## 2016-01-07 DIAGNOSIS — E785 Hyperlipidemia, unspecified: Secondary | ICD-10-CM

## 2016-01-07 DIAGNOSIS — F32A Depression, unspecified: Secondary | ICD-10-CM

## 2016-01-07 DIAGNOSIS — I10 Essential (primary) hypertension: Secondary | ICD-10-CM

## 2016-01-07 DIAGNOSIS — I700 Atherosclerosis of aorta: Secondary | ICD-10-CM

## 2016-01-21 ENCOUNTER — Other Ambulatory Visit: Payer: Self-pay

## 2016-01-21 DIAGNOSIS — J4520 Mild intermittent asthma, uncomplicated: Secondary | ICD-10-CM

## 2016-01-21 DIAGNOSIS — J219 Acute bronchiolitis, unspecified: Secondary | ICD-10-CM

## 2016-01-21 MED ORDER — AMOXICILLIN-POT CLAVULANATE 875-125 MG PO TABS: 1.0000 | ORAL_TABLET | Freq: Two times a day (BID) | ORAL | 0 refills | Status: DC

## 2016-02-04 ENCOUNTER — Other Ambulatory Visit: Payer: Self-pay

## 2016-02-04 MED ORDER — SERTRALINE HCL 100 MG PO TABS
100.0000 mg | ORAL_TABLET | Freq: Every day | ORAL | 0 refills | Status: DC
Start: 2016-02-04 — End: 2016-02-19

## 2016-02-19 ENCOUNTER — Ambulatory Visit (INDEPENDENT_AMBULATORY_CARE_PROVIDER_SITE_OTHER): Payer: Medicare Other | Admitting: Family Medicine

## 2016-02-19 ENCOUNTER — Encounter: Payer: Self-pay | Admitting: Family Medicine

## 2016-02-19 VITALS — BP 124/70 | HR 76 | Ht 68.0 in | Wt 172.0 lb

## 2016-02-19 DIAGNOSIS — R69 Illness, unspecified: Secondary | ICD-10-CM

## 2016-02-19 DIAGNOSIS — E063 Autoimmune thyroiditis: Secondary | ICD-10-CM | POA: Diagnosis not present

## 2016-02-19 DIAGNOSIS — E038 Other specified hypothyroidism: Secondary | ICD-10-CM

## 2016-02-19 DIAGNOSIS — I10 Essential (primary) hypertension: Secondary | ICD-10-CM | POA: Diagnosis not present

## 2016-02-19 DIAGNOSIS — F419 Anxiety disorder, unspecified: Secondary | ICD-10-CM | POA: Diagnosis not present

## 2016-02-19 DIAGNOSIS — J4520 Mild intermittent asthma, uncomplicated: Secondary | ICD-10-CM | POA: Diagnosis not present

## 2016-02-19 DIAGNOSIS — E782 Mixed hyperlipidemia: Secondary | ICD-10-CM | POA: Diagnosis not present

## 2016-02-19 DIAGNOSIS — K219 Gastro-esophageal reflux disease without esophagitis: Secondary | ICD-10-CM | POA: Diagnosis not present

## 2016-02-19 DIAGNOSIS — I700 Atherosclerosis of aorta: Secondary | ICD-10-CM

## 2016-02-19 MED ORDER — ALBUTEROL SULFATE HFA 108 (90 BASE) MCG/ACT IN AERS: 2.0000 | INHALATION_SPRAY | Freq: Four times a day (QID) | RESPIRATORY_TRACT | 1 refills | Status: DC | PRN

## 2016-02-19 MED ORDER — SIMVASTATIN 80 MG PO TABS: 80.0000 mg | ORAL_TABLET | Freq: Every day | ORAL | 1 refills | Status: DC

## 2016-02-19 MED ORDER — LOSARTAN POTASSIUM 50 MG PO TABS: 50.0000 mg | ORAL_TABLET | Freq: Every day | ORAL | 1 refills | Status: DC

## 2016-02-19 MED ORDER — SERTRALINE HCL 100 MG PO TABS: 100.0000 mg | ORAL_TABLET | Freq: Every day | ORAL | 1 refills | Status: DC

## 2016-02-19 MED ORDER — CLOPIDOGREL BISULFATE 75 MG PO TABS: 75.0000 mg | ORAL_TABLET | Freq: Every day | ORAL | 1 refills | Status: DC

## 2016-02-19 MED ORDER — CLONAZEPAM 0.5 MG PO TABS: 0.5000 mg | ORAL_TABLET | Freq: Every day | ORAL | 1 refills | Status: DC

## 2016-02-19 MED ORDER — FENOFIBRIC ACID 135 MG PO CPDR: 1.0000 | DELAYED_RELEASE_CAPSULE | Freq: Every day | ORAL | 1 refills | Status: DC

## 2016-02-19 MED ORDER — DEXLANSOPRAZOLE 60 MG PO CPDR: 1.0000 | DELAYED_RELEASE_CAPSULE | Freq: Every day | ORAL | 1 refills | Status: DC

## 2016-02-19 NOTE — Progress Notes (Signed)
Name: Joel Flores   MRN: [id number]    DOB: 08-12-1950   Date:02/19/2016       Progress Note  Subjective  Chief Complaint  Chief Complaint  Patient presents with  . COPD  . Anxiety  . Gastroesophageal Reflux  . Hypothyroidism  . Hypertension  . Hyperlipidemia  . Depression  . Allergic Rhinitis     Anxiety  Presents for follow-up visit. Symptoms include depressed mood, excessive worry, insomnia and nervous/anxious behavior. Patient reports no chest pain, compulsions, confusion, decreased concentration, dizziness, dry mouth, feeling of choking, hyperventilation, irritability, malaise, muscle tension, nausea, palpitations, panic, restlessness, shortness of breath or suicidal ideas. Symptoms occur most days. The severity of symptoms is mild. The quality of sleep is good. Nighttime awakenings: none.    Gastroesophageal Reflux  He reports no abdominal pain, no belching, no chest pain, no choking, no coughing, no dysphagia, no early satiety, no globus sensation, no heartburn, no hoarse voice, no nausea, no sore throat, no stridor, no tooth decay, no water brash or no wheezing. This is a recurrent problem. The current episode started more than 1 year ago. The problem occurs frequently. The problem has been waxing and waning. The symptoms are aggravated by certain foods. Pertinent negatives include no anemia, fatigue, melena, muscle weakness, orthopnea or weight loss. He has tried a PPI for the symptoms. The treatment provided moderate relief.  Hypertension  This is a recurrent problem. The current episode started more than 1 year ago. The problem has been gradually worsening since onset. The problem is controlled. Associated symptoms include anxiety. Pertinent negatives include no blurred vision, chest pain, headaches, malaise/fatigue, neck pain, orthopnea, palpitations or shortness of breath. There are no known risk factors for coronary artery disease. Past treatments include angiotensin blockers.  The current treatment provides moderate improvement. There are no compliance problems.  There is no history of angina, kidney disease, CAD/MI, CVA, heart failure, left ventricular hypertrophy, PVD, renovascular disease or retinopathy. There is no history of chronic renal disease or a hypertension causing med.  Hyperlipidemia  This is a chronic problem. The current episode started more than 1 year ago. The problem is controlled. Recent lipid tests were reviewed and are normal. He has no history of chronic renal disease, diabetes or liver disease. Pertinent negatives include no chest pain, focal sensory loss, focal weakness, leg pain, myalgias or shortness of breath. Current antihyperlipidemic treatment includes statins. The current treatment provides moderate improvement of lipids. There are no compliance problems.  Risk factors for coronary artery disease include hypertension, male sex and stress.  Depression         This is a chronic problem.  The onset quality is gradual.   The problem occurs daily.  Associated symptoms include insomnia.  Associated symptoms include no decreased concentration, no fatigue, no helplessness, no hopelessness, not irritable, no restlessness, no decreased interest, no appetite change, no body aches, no myalgias, no headaches, no indigestion, not sad and no suicidal ideas.  Past treatments include SSRIs - Selective serotonin reuptake inhibitors.  Past medical history includes anxiety.     No problem-specific Assessment & Plan notes found for this encounter.   Past Medical History:  Diagnosis Date  . Anxiety   . Depression   . GERD (gastroesophageal reflux disease)   . Hyperlipidemia   . Hypertension   . Thyroid disease   . Venous insufficiency     Past Surgical History:  Procedure Laterality Date  . COLONOSCOPY  2015   Dr Allen Norris-  cleared for 5 years   . VEIN SURGERY     2 stents in R) leg    Family History  Problem Relation Age of Onset  . Diabetes Mother    . Cancer Father   . Heart disease Father   . Stroke Father     Social History   Social History  . Marital status: Married    Spouse name: N/A  . Number of children: N/A  . Years of education: N/A   Occupational History  . Not on file.   Social History Main Topics  . Smoking status: Current Every Day Smoker  . Smokeless tobacco: Never Used  . Alcohol use 0.0 oz/week  . Drug use: No  . Sexual activity: No   Other Topics Concern  . Not on file   Social History Narrative  . No narrative on file    Allergies  Allergen Reactions  . Sulfa Antibiotics      Review of Systems  Constitutional: Negative for appetite change, chills, fatigue, fever, irritability, malaise/fatigue and weight loss.  HENT: Negative for ear discharge, ear pain, hoarse voice and sore throat.   Eyes: Negative for blurred vision.  Respiratory: Negative for cough, sputum production, choking, shortness of breath and wheezing.   Cardiovascular: Negative for chest pain, palpitations, orthopnea and leg swelling.  Gastrointestinal: Negative for abdominal pain, blood in stool, constipation, diarrhea, dysphagia, heartburn, melena and nausea.  Genitourinary: Negative for dysuria, frequency, hematuria and urgency.  Musculoskeletal: Negative for back pain, joint pain, myalgias, muscle weakness and neck pain.  Skin: Negative for rash.  Neurological: Negative for dizziness, tingling, sensory change, focal weakness and headaches.  Endo/Heme/Allergies: Negative for environmental allergies and polydipsia. Does not bruise/bleed easily.  Psychiatric/Behavioral: Positive for depression. Negative for confusion, decreased concentration and suicidal ideas. The patient is nervous/anxious and has insomnia.      Objective  Vitals:   02/19/16 0843  BP: 124/70  Pulse: 76  Weight: 172 lb (78 kg)  Height: 5\' 8"  (1.727 m)    Physical Exam  Constitutional: He is oriented to person, place, and time and well-developed,  well-nourished, and in no distress. He is not irritable.  HENT:  Head: Normocephalic.  Right Ear: Tympanic membrane, external ear and ear canal normal.  Left Ear: Tympanic membrane, external ear and ear canal normal.  Nose: Nose normal.  Mouth/Throat: Oropharynx is clear and moist.  Eyes: Conjunctivae and EOM are normal. Pupils are equal, round, and reactive to light. Right eye exhibits no discharge. Left eye exhibits no discharge. No scleral icterus.  Neck: Normal range of motion. Neck supple. No JVD present. No tracheal deviation present. No thyromegaly present.  Cardiovascular: Normal rate, regular rhythm, normal heart sounds and intact distal pulses.  Exam reveals no gallop and no friction rub.   No murmur heard. Pulmonary/Chest: Breath sounds normal. No respiratory distress. He has no wheezes. He has no rales.  Abdominal: Soft. Bowel sounds are normal. He exhibits no mass. There is no hepatosplenomegaly. There is no tenderness. There is no rebound, no guarding and no CVA tenderness.  Musculoskeletal: Normal range of motion. He exhibits no edema or tenderness.  Lymphadenopathy:    He has no cervical adenopathy.  Neurological: He is alert and oriented to person, place, and time. He has normal sensation, normal strength, normal reflexes and intact cranial nerves. No cranial nerve deficit.  Skin: Skin is warm. No rash noted.  Psychiatric: Mood and affect normal.  Nursing note and vitals reviewed.  Assessment & Plan  Problem List Items Addressed This Visit      Cardiovascular and Mediastinum   Atherosclerosis of aorta (HCC)   Relevant Medications   simvastatin (ZOCOR) 80 MG tablet   clopidogrel (PLAVIX) 75 MG tablet   losartan (COZAAR) 50 MG tablet   Choline Fenofibrate (FENOFIBRIC ACID) 135 MG CPDR   Other Relevant Orders   Lipid panel   Essential hypertension   Relevant Medications   simvastatin (ZOCOR) 80 MG tablet   losartan (COZAAR) 50 MG tablet   Choline Fenofibrate  (FENOFIBRIC ACID) 135 MG CPDR   Other Relevant Orders   Renal function panel     Digestive   Esophageal reflux   Relevant Medications   dexlansoprazole (DEXILANT) 60 MG capsule     Endocrine   Thyroid activity decreased   Relevant Orders   Thyroid Panel With TSH     Other   Chronic anxiety   Relevant Medications   clonazePAM (KLONOPIN) 0.5 MG tablet   sertraline (ZOLOFT) 100 MG tablet   Hyperlipemia   Relevant Medications   simvastatin (ZOCOR) 80 MG tablet   losartan (COZAAR) 50 MG tablet   Choline Fenofibrate (FENOFIBRIC ACID) 135 MG CPDR   Other Relevant Orders   Lipid panel    Other Visit Diagnoses    Taking medication for chronic disease    -  Primary   Relevant Orders   Hepatic function panel   Reactive airway disease, mild intermittent, uncomplicated       Relevant Medications   albuterol (PROVENTIL HFA;VENTOLIN HFA) 108 (90 Base) MCG/ACT inhaler        Dr. [facility]  02/19/16

## 2016-02-20 LAB — RENAL FUNCTION PANEL
Albumin: 4.4 g/dL (ref 3.6–4.8)
BUN / CREAT RATIO: 21 (ref 10–24)
BUN: 21 mg/dL (ref 8–27)
CALCIUM: 9.7 mg/dL (ref 8.6–10.2)
CHLORIDE: 104 mmol/L (ref 96–106)
CO2: 21 mmol/L (ref 18–29)
Creatinine, Ser: 1.01 mg/dL (ref 0.76–1.27)
GFR calc Af Amer: 90 mL/min/{1.73_m2} (ref >59)
GFR calc non Af Amer: 78 mL/min/{1.73_m2} (ref >59)
Glucose: 97 mg/dL (ref 65–99)
PHOSPHORUS: 2.7 mg/dL (ref 2.5–4.5)
Potassium: 4.9 mmol/L (ref 3.5–5.2)
SODIUM: 143 mmol/L (ref 134–144)

## 2016-02-20 LAB — THYROID PANEL WITH TSH
Free Thyroxine Index: 2.4 (ref 1.2–4.9)
T3 Uptake Ratio: 30 % (ref 24–39)
T4, Total: 7.9 ug/dL (ref 4.5–12.0)
TSH: 3.48 u[IU]/mL (ref 0.450–4.500)

## 2016-02-20 LAB — LIPID PANEL
CHOLESTEROL TOTAL: 159 mg/dL (ref 100–199)
Chol/HDL Ratio: 4.1 ratio units (ref 0.0–5.0)
HDL: 39 mg/dL — ABNORMAL LOW (ref >39)
LDL Calculated: 98 mg/dL (ref 0–99)
TRIGLYCERIDES: 109 mg/dL (ref 0–149)
VLDL Cholesterol Cal: 22 mg/dL (ref 5–40)

## 2016-02-20 LAB — HEPATIC FUNCTION PANEL
ALT: 14 IU/L (ref 0–44)
AST: 20 IU/L (ref 0–40)
Alkaline Phosphatase: 63 IU/L (ref 39–117)
BILIRUBIN TOTAL: 0.3 mg/dL (ref 0.0–1.2)
BILIRUBIN, DIRECT: 0.11 mg/dL (ref 0.00–0.40)
Total Protein: 6.7 g/dL (ref 6.0–8.5)

## 2016-02-21 ENCOUNTER — Other Ambulatory Visit: Payer: Self-pay

## 2016-02-21 MED ORDER — LEVOTHYROXINE SODIUM 100 MCG PO TABS: 100.0000 ug | ORAL_TABLET | Freq: Every day | ORAL | 1 refills | Status: DC

## 2016-08-12 ENCOUNTER — Ambulatory Visit (INDEPENDENT_AMBULATORY_CARE_PROVIDER_SITE_OTHER): Payer: Medicare Other | Admitting: Family Medicine

## 2016-08-12 ENCOUNTER — Encounter: Payer: Self-pay | Admitting: Family Medicine

## 2016-08-12 VITALS — BP 120/80 | HR 80 | Ht 68.0 in | Wt 177.0 lb

## 2016-08-12 DIAGNOSIS — D2239 Melanocytic nevi of other parts of face: Secondary | ICD-10-CM

## 2016-08-12 DIAGNOSIS — E782 Mixed hyperlipidemia: Secondary | ICD-10-CM

## 2016-08-12 DIAGNOSIS — I10 Essential (primary) hypertension: Secondary | ICD-10-CM

## 2016-08-12 DIAGNOSIS — F419 Anxiety disorder, unspecified: Secondary | ICD-10-CM | POA: Diagnosis not present

## 2016-08-12 DIAGNOSIS — J4520 Mild intermittent asthma, uncomplicated: Secondary | ICD-10-CM | POA: Diagnosis not present

## 2016-08-12 DIAGNOSIS — F3341 Major depressive disorder, recurrent, in partial remission: Secondary | ICD-10-CM | POA: Diagnosis not present

## 2016-08-12 DIAGNOSIS — E039 Hypothyroidism, unspecified: Secondary | ICD-10-CM | POA: Diagnosis not present

## 2016-08-12 DIAGNOSIS — I700 Atherosclerosis of aorta: Secondary | ICD-10-CM | POA: Diagnosis not present

## 2016-08-12 DIAGNOSIS — K219 Gastro-esophageal reflux disease without esophagitis: Secondary | ICD-10-CM | POA: Diagnosis not present

## 2016-08-12 DIAGNOSIS — R140 Abdominal distension (gaseous): Secondary | ICD-10-CM

## 2016-08-12 MED ORDER — LOSARTAN POTASSIUM 50 MG PO TABS: 50.0000 mg | ORAL_TABLET | Freq: Every day | ORAL | 1 refills | Status: DC

## 2016-08-12 MED ORDER — LEVOTHYROXINE SODIUM 100 MCG PO TABS: 100.0000 ug | ORAL_TABLET | Freq: Every day | ORAL | 1 refills | Status: DC

## 2016-08-12 MED ORDER — CLOPIDOGREL BISULFATE 75 MG PO TABS: 75.0000 mg | ORAL_TABLET | Freq: Every day | ORAL | 1 refills | Status: DC

## 2016-08-12 MED ORDER — DEXLANSOPRAZOLE 60 MG PO CPDR: 1.0000 | DELAYED_RELEASE_CAPSULE | Freq: Every day | ORAL | 1 refills | Status: DC

## 2016-08-12 MED ORDER — SIMVASTATIN 80 MG PO TABS: 80.0000 mg | ORAL_TABLET | Freq: Every day | ORAL | 1 refills | Status: DC

## 2016-08-12 MED ORDER — SERTRALINE HCL 100 MG PO TABS
100.0000 mg | ORAL_TABLET | Freq: Every day | ORAL | 1 refills | Status: DC
Start: 2016-08-12 — End: 2017-01-29

## 2016-08-12 MED ORDER — ALBUTEROL SULFATE HFA 108 (90 BASE) MCG/ACT IN AERS: 2.0000 | INHALATION_SPRAY | Freq: Four times a day (QID) | RESPIRATORY_TRACT | 1 refills | Status: DC | PRN

## 2016-08-12 MED ORDER — FENOFIBRIC ACID 135 MG PO CPDR: 1.0000 | DELAYED_RELEASE_CAPSULE | Freq: Every day | ORAL | 1 refills | Status: DC

## 2016-08-12 MED ORDER — CLONAZEPAM 0.5 MG PO TABS: 0.5000 mg | ORAL_TABLET | Freq: Every day | ORAL | 1 refills | Status: DC

## 2016-08-12 NOTE — Progress Notes (Signed)
Name: Joel Flores   MRN: [id number]    DOB: 10-06-1950   Date:08/12/2016       Progress Note  Subjective  Chief Complaint  Chief Complaint  Patient presents with  . Allergic Rhinitis   . Anxiety  . Hypothyroidism  . Gastroesophageal Reflux  . Hyperlipidemia  . Hypertension  . Depression    Anxiety  Presents for follow-up visit. Symptoms include excessive worry, irritability and nervous/anxious behavior. Patient reports no chest pain, compulsions, confusion, decreased concentration, depressed mood, dizziness, dry mouth, feeling of choking, hyperventilation, impotence, insomnia, malaise, muscle tension, nausea, obsessions, palpitations, panic, restlessness, shortness of breath or suicidal ideas. Symptoms occur occasionally. The quality of sleep is good. Nighttime awakenings: occasional.   His past medical history is significant for depression.  Gastroesophageal Reflux  He reports no abdominal pain, no belching, no chest pain, no choking, no coughing, no dysphagia, no early satiety, no globus sensation, no heartburn, no nausea, no sore throat, no stridor, no tooth decay or no wheezing. This is a chronic problem. The current episode started more than 1 year ago. The problem occurs frequently. The symptoms are aggravated by certain foods. Associated symptoms include fatigue. Pertinent negatives include no melena or weight loss. There are no known risk factors. The treatment provided mild relief.  Hyperlipidemia  This is a chronic problem. The current episode started more than 1 year ago. The problem is controlled. Recent lipid tests were reviewed and are normal. He has no history of chronic renal disease, diabetes, hypothyroidism, liver disease, obesity or nephrotic syndrome. There are no known factors aggravating his hyperlipidemia. Pertinent negatives include no chest pain, focal weakness, myalgias or shortness of breath. Current antihyperlipidemic treatment includes statins. The current  treatment provides moderate improvement of lipids. There are no compliance problems.  Risk factors for coronary artery disease include hypertension, male sex, dyslipidemia and obesity.  Hypertension  This is a chronic problem. The current episode started more than 1 year ago. The problem has been waxing and waning since onset. The problem is controlled. Associated symptoms include anxiety. Pertinent negatives include no blurred vision, chest pain, headaches, malaise/fatigue, neck pain, orthopnea, palpitations, peripheral edema, PND, shortness of breath or sweats. There are no associated agents to hypertension. Risk factors for coronary artery disease include obesity, dyslipidemia and post-menopausal state. Past treatments include angiotensin blockers. Identifiable causes of hypertension include a thyroid problem. There is no history of chronic renal disease.  Depression       The patient presents with depression.  This is a new problem.  The onset quality is sudden.   Associated symptoms include fatigue and decreased interest.  Associated symptoms include no decreased concentration, no helplessness, no hopelessness, does not have insomnia, not irritable, no restlessness, no appetite change, no body aches, no myalgias, no headaches, no indigestion, not sad and no suicidal ideas.  Past treatments include SSRIs - Selective serotonin reuptake inhibitors.  Compliance with treatment is good.  Previous treatment provided mild relief.  Past medical history includes thyroid problem, anxiety and depression.     Pertinent negatives include no chronic pain and no hypothyroidism. Thyroid Problem  Presents for follow-up visit. Symptoms include anxiety, dry skin and fatigue. Patient reports no cold intolerance, constipation, depressed mood, diarrhea, hair loss, heat intolerance, palpitations, weight gain or weight loss. The symptoms have been stable. His past medical history is significant for hyperlipidemia. There is no  history of diabetes.    No problem-specific Assessment & Plan notes found for this encounter.  Past Medical History:  Diagnosis Date  . Anxiety   . Depression   . GERD (gastroesophageal reflux disease)   . Hyperlipidemia   . Hypertension   . Thyroid disease   . Venous insufficiency     Past Surgical History:  Procedure Laterality Date  . COLONOSCOPY  2015   Dr Allen Norris- cleared for 5 years   . VEIN SURGERY     2 stents in R) leg    Family History  Problem Relation Age of Onset  . Diabetes Mother   . Cancer Father   . Heart disease Father   . Stroke Father     Social History   Social History  . Marital status: Married    Spouse name: N/A  . Number of children: N/A  . Years of education: N/A   Occupational History  . Not on file.   Social History Main Topics  . Smoking status: Current Every Day Smoker  . Smokeless tobacco: Never Used  . Alcohol use 0.0 oz/week  . Drug use: No  . Sexual activity: No   Other Topics Concern  . Not on file   Social History Narrative  . No narrative on file    Allergies  Allergen Reactions  . Sulfa Antibiotics     Outpatient Medications Prior to Visit  Medication Sig Dispense Refill  . cetirizine (ZYRTEC) 10 MG tablet Take 10 mg by mouth daily.    . Choline Fenofibrate (FENOFIBRIC ACID) 135 MG CPDR Take 1 capsule by mouth daily. 90 capsule 1  . clonazePAM (KLONOPIN) 0.5 MG tablet Take 1 tablet (0.5 mg total) by mouth daily. Take one half bid 90 tablet 1  . clopidogrel (PLAVIX) 75 MG tablet Take 1 tablet (75 mg total) by mouth daily. 90 tablet 1  . dexlansoprazole (DEXILANT) 60 MG capsule Take 1 capsule (60 mg total) by mouth daily. 90 capsule 1  . levothyroxine (SYNTHROID, LEVOTHROID) 100 MCG tablet Take 1 tablet (100 mcg total) by mouth daily before breakfast. 90 tablet 1  . losartan (COZAAR) 50 MG tablet Take 1 tablet (50 mg total) by mouth daily. 90 tablet 1  . sertraline (ZOLOFT) 100 MG tablet Take 1 tablet (100 mg  total) by mouth daily. 90 tablet 1  . simvastatin (ZOCOR) 80 MG tablet Take 1 tablet (80 mg total) by mouth daily. 90 tablet 1  . albuterol (PROVENTIL HFA;VENTOLIN HFA) 108 (90 Base) MCG/ACT inhaler Inhale 2 puffs into the lungs every 6 (six) hours as needed for wheezing or shortness of breath. (Patient not taking: Reported on 08/12/2016) 3 Inhaler 1   No facility-administered medications prior to visit.     Review of Systems  Constitutional: Positive for fatigue and irritability. Negative for appetite change, chills, fever, malaise/fatigue, weight gain and weight loss.  HENT: Negative for ear discharge, ear pain and sore throat.   Eyes: Negative for blurred vision.  Respiratory: Negative for cough, sputum production, choking, shortness of breath and wheezing.   Cardiovascular: Negative for chest pain, palpitations, orthopnea, leg swelling and PND.  Gastrointestinal: Negative for abdominal pain, blood in stool, constipation, diarrhea, dysphagia, heartburn, melena and nausea.  Genitourinary: Negative for dysuria, frequency, hematuria, impotence and urgency.  Musculoskeletal: Negative for back pain, joint pain, myalgias and neck pain.  Skin: Negative for rash.  Neurological: Negative for dizziness, tingling, sensory change, focal weakness and headaches.  Endo/Heme/Allergies: Negative for environmental allergies, cold intolerance, heat intolerance and polydipsia. Does not bruise/bleed easily.  Psychiatric/Behavioral: Positive for depression. Negative for confusion,  decreased concentration and suicidal ideas. The patient is nervous/anxious. The patient does not have insomnia.      Objective  Vitals:   08/12/16 0937  BP: 120/80  Pulse: 80  Weight: 184 lb (83.5 kg)  Height: 5\' 8"  (1.727 m)    Physical Exam  Constitutional: He is oriented to person, place, and time and well-developed, well-nourished, and in no distress. He is not irritable.  HENT:  Head: Normocephalic.  Right Ear:  External ear normal.  Left Ear: External ear normal.  Nose: Nose normal.  Mouth/Throat: Oropharynx is clear and moist.  Eyes: Conjunctivae and EOM are normal. Pupils are equal, round, and reactive to light. Right eye exhibits no discharge. Left eye exhibits no discharge. No scleral icterus.  Neck: Normal range of motion. Neck supple. No JVD present. No tracheal deviation present. No thyromegaly present.  Cardiovascular: Normal rate, regular rhythm, normal heart sounds and intact distal pulses.  Exam reveals no gallop and no friction rub.   No murmur heard. Pulmonary/Chest: Breath sounds normal. No respiratory distress. He has no wheezes. He has no rales.  Abdominal: Soft. Bowel sounds are normal. He exhibits distension. He exhibits no mass. There is no hepatosplenomegaly. There is no tenderness. There is no rebound, no guarding and no CVA tenderness.  Musculoskeletal: Normal range of motion. He exhibits no edema or tenderness.  Lymphadenopathy:    He has no cervical adenopathy.  Neurological: He is alert and oriented to person, place, and time. He has normal sensation, normal strength, normal reflexes and intact cranial nerves. No cranial nerve deficit.  Skin: Skin is warm. Rash noted. Rash is macular.     Pigmented nevus  Psychiatric: Memory, affect and judgment normal. His mood appears anxious. He exhibits a depressed mood.  Nursing note and vitals reviewed.     Assessment & Plan  Problem List Items Addressed This Visit      Cardiovascular and Mediastinum   Atherosclerosis of aorta (HCC)   Relevant Medications   losartan (COZAAR) 50 MG tablet   simvastatin (ZOCOR) 80 MG tablet   clopidogrel (PLAVIX) 75 MG tablet   Choline Fenofibrate (FENOFIBRIC ACID) 135 MG CPDR   Essential hypertension - Primary   Relevant Medications   losartan (COZAAR) 50 MG tablet   simvastatin (ZOCOR) 80 MG tablet   Choline Fenofibrate (FENOFIBRIC ACID) 135 MG CPDR   Other Relevant Orders   Renal  Function Panel     Respiratory   Reactive airway disease, mild intermittent, uncomplicated   Relevant Medications   albuterol (PROVENTIL HFA;VENTOLIN HFA) 108 (90 Base) MCG/ACT inhaler     Digestive   Esophageal reflux   Relevant Medications   dexlansoprazole (DEXILANT) 60 MG capsule     Endocrine   Adult hypothyroidism   Relevant Medications   levothyroxine (SYNTHROID, LEVOTHROID) 100 MCG tablet   Other Relevant Orders   TSH     Other   Depression   Relevant Medications   sertraline (ZOLOFT) 100 MG tablet   Chronic anxiety   Relevant Medications   clonazePAM (KLONOPIN) 0.5 MG tablet   sertraline (ZOLOFT) 100 MG tablet   Hyperlipemia   Relevant Medications   losartan (COZAAR) 50 MG tablet   simvastatin (ZOCOR) 80 MG tablet   Choline Fenofibrate (FENOFIBRIC ACID) 135 MG CPDR   Other Relevant Orders   Lipid Profile   Abdominal distension   Relevant Orders   US Abdomen Complete   Hepatic Function Panel (6)    Other Visit Diagnoses    Melanocytic  nevus of face, other location       Relevant Orders   Ambulatory referral to Dermatology      Meds ordered this encounter  Medications  . losartan (COZAAR) 50 MG tablet    Sig: Take 1 tablet (50 mg total) by mouth daily.    Dispense:  90 tablet    Refill:  1  . dexlansoprazole (DEXILANT) 60 MG capsule    Sig: Take 1 capsule (60 mg total) by mouth daily.    Dispense:  90 capsule    Refill:  1  . simvastatin (ZOCOR) 80 MG tablet    Sig: Take 1 tablet (80 mg total) by mouth daily.    Dispense:  90 tablet    Refill:  1  . albuterol (PROVENTIL HFA;VENTOLIN HFA) 108 (90 Base) MCG/ACT inhaler    Sig: Inhale 2 puffs into the lungs every 6 (six) hours as needed for wheezing or shortness of breath.    Dispense:  3 Inhaler    Refill:  1  . clonazePAM (KLONOPIN) 0.5 MG tablet    Sig: Take 1 tablet (0.5 mg total) by mouth daily. Take one half bid    Dispense:  90 tablet    Refill:  1  . clopidogrel (PLAVIX) 75 MG tablet     Sig: Take 1 tablet (75 mg total) by mouth daily.    Dispense:  90 tablet    Refill:  1  . sertraline (ZOLOFT) 100 MG tablet    Sig: Take 1 tablet (100 mg total) by mouth daily.    Dispense:  90 tablet    Refill:  1  . levothyroxine (SYNTHROID, LEVOTHROID) 100 MCG tablet    Sig: Take 1 tablet (100 mcg total) by mouth daily before breakfast.    Dispense:  90 tablet    Refill:  1  . Choline Fenofibrate (FENOFIBRIC ACID) 135 MG CPDR    Sig: Take 1 capsule by mouth daily.    Dispense:  90 capsule    Refill:  1      Dr. Otilio Mi[facility]  08/12/16

## 2016-08-13 ENCOUNTER — Other Ambulatory Visit: Payer: Self-pay | Admitting: Family Medicine

## 2016-08-13 DIAGNOSIS — I10 Essential (primary) hypertension: Secondary | ICD-10-CM

## 2016-08-13 DIAGNOSIS — E782 Mixed hyperlipidemia: Secondary | ICD-10-CM

## 2016-08-13 DIAGNOSIS — K219 Gastro-esophageal reflux disease without esophagitis: Secondary | ICD-10-CM

## 2016-08-13 LAB — HEPATIC FUNCTION PANEL (6)
ALK PHOS: 53 IU/L (ref 39–117)
ALT: 16 IU/L (ref 0–44)
AST: 21 IU/L (ref 0–40)
BILIRUBIN TOTAL: 0.3 mg/dL (ref 0.0–1.2)
BILIRUBIN, DIRECT: 0.11 mg/dL (ref 0.00–0.40)

## 2016-08-13 LAB — RENAL FUNCTION PANEL
Albumin: 4.3 g/dL (ref 3.6–4.8)
BUN / CREAT RATIO: 19 (ref 10–24)
BUN: 19 mg/dL (ref 8–27)
CALCIUM: 9.6 mg/dL (ref 8.6–10.2)
CHLORIDE: 104 mmol/L (ref 96–106)
CO2: 23 mmol/L (ref 18–29)
Creatinine, Ser: 0.98 mg/dL (ref 0.76–1.27)
GFR calc non Af Amer: 80 mL/min/{1.73_m2} (ref >59)
GFR, EST AFRICAN AMERICAN: 92 mL/min/{1.73_m2} (ref >59)
GLUCOSE: 76 mg/dL (ref 65–99)
POTASSIUM: 4.7 mmol/L (ref 3.5–5.2)
Phosphorus: 2.7 mg/dL (ref 2.5–4.5)
SODIUM: 140 mmol/L (ref 134–144)

## 2016-08-13 LAB — TSH: TSH: 2.25 u[IU]/mL (ref 0.450–4.500)

## 2016-08-13 LAB — LIPID PANEL
CHOLESTEROL TOTAL: 143 mg/dL (ref 100–199)
Chol/HDL Ratio: 4.1 ratio (ref 0.0–5.0)
HDL: 35 mg/dL — ABNORMAL LOW (ref >39)
LDL Calculated: 85 mg/dL (ref 0–99)
Triglycerides: 117 mg/dL (ref 0–149)
VLDL Cholesterol Cal: 23 mg/dL (ref 5–40)

## 2016-08-17 ENCOUNTER — Ambulatory Visit
Admission: RE | Admit: 2016-08-17 | Discharge: 2016-08-17 | Disposition: A | Discharge status: Home / Self Care | Payer: Medicare Other | Source: Ambulatory Visit | Attending: Family Medicine | Admitting: Family Medicine

## 2016-08-17 DIAGNOSIS — K8020 Calculus of gallbladder without cholecystitis without obstruction: Secondary | ICD-10-CM | POA: Insufficient documentation

## 2016-08-17 DIAGNOSIS — R140 Abdominal distension (gaseous): Secondary | ICD-10-CM

## 2016-08-17 DIAGNOSIS — R932 Abnormal findings on diagnostic imaging of liver and biliary tract: Secondary | ICD-10-CM | POA: Insufficient documentation

## 2016-10-30 ENCOUNTER — Encounter: Payer: Self-pay | Admitting: Emergency Medicine

## 2016-10-30 ENCOUNTER — Other Ambulatory Visit: Payer: Self-pay

## 2016-10-30 ENCOUNTER — Telehealth: Payer: Self-pay

## 2016-10-30 ENCOUNTER — Emergency Department
Admission: EM | Admit: 2016-10-30 | Discharge: 2016-10-30 | Disposition: A | Discharge status: Home / Self Care | Payer: Medicare Other | Attending: Emergency Medicine | Admitting: Emergency Medicine

## 2016-10-30 ENCOUNTER — Emergency Department: Payer: Medicare Other

## 2016-10-30 DIAGNOSIS — E039 Hypothyroidism, unspecified: Secondary | ICD-10-CM | POA: Diagnosis not present

## 2016-10-30 DIAGNOSIS — I10 Essential (primary) hypertension: Secondary | ICD-10-CM | POA: Diagnosis not present

## 2016-10-30 DIAGNOSIS — Z7902 Long term (current) use of antithrombotics/antiplatelets: Secondary | ICD-10-CM | POA: Insufficient documentation

## 2016-10-30 DIAGNOSIS — F17200 Nicotine dependence, unspecified, uncomplicated: Secondary | ICD-10-CM | POA: Insufficient documentation

## 2016-10-30 DIAGNOSIS — K2900 Acute gastritis without bleeding: Secondary | ICD-10-CM | POA: Insufficient documentation

## 2016-10-30 DIAGNOSIS — R1012 Left upper quadrant pain: Secondary | ICD-10-CM | POA: Insufficient documentation

## 2016-10-30 DIAGNOSIS — Z79899 Other long term (current) drug therapy: Secondary | ICD-10-CM | POA: Diagnosis not present

## 2016-10-30 LAB — CBC
HCT: 42 % (ref 40.0–52.0)
Hemoglobin: 14.4 g/dL (ref 13.0–18.0)
MCH: 31 pg (ref 26.0–34.0)
MCHC: 34.4 g/dL (ref 32.0–36.0)
MCV: 90 fL (ref 80.0–100.0)
PLATELETS: 349 10*3/uL (ref 150–440)
RBC: 4.66 MIL/uL (ref 4.40–5.90)
RDW: 14.2 % (ref 11.5–14.5)
WBC: 18.2 10*3/uL — ABNORMAL HIGH (ref 3.8–10.6)

## 2016-10-30 LAB — COMPREHENSIVE METABOLIC PANEL
ALT: 19 U/L (ref 17–63)
AST: 31 U/L (ref 15–41)
Albumin: 4.2 g/dL (ref 3.5–5.0)
Alkaline Phosphatase: 47 U/L (ref 38–126)
Anion gap: 8 (ref 5–15)
BUN: 20 mg/dL (ref 6–20)
CHLORIDE: 104 mmol/L (ref 101–111)
CO2: 25 mmol/L (ref 22–32)
CREATININE: 1.04 mg/dL (ref 0.61–1.24)
Calcium: 9.7 mg/dL (ref 8.9–10.3)
GFR calc Af Amer: >60 mL/min (ref >60)
Glucose, Bld: 130 mg/dL — ABNORMAL HIGH (ref 65–99)
Potassium: 3.7 mmol/L (ref 3.5–5.1)
SODIUM: 137 mmol/L (ref 135–145)
Total Bilirubin: 0.8 mg/dL (ref 0.3–1.2)
Total Protein: 7.6 g/dL (ref 6.5–8.1)

## 2016-10-30 LAB — LIPASE, BLOOD: LIPASE: 23 U/L (ref 11–51)

## 2016-10-30 MED ORDER — METOCLOPRAMIDE HCL 5 MG/ML IJ SOLN
10.0000 mg | Freq: Once | INTRAMUSCULAR | Status: AC
  Administered 2016-10-30: 10 mg via INTRAVENOUS
  Filled 2016-10-30: qty 2

## 2016-10-30 MED ORDER — FAMOTIDINE 20 MG PO TABS
40.0000 mg | ORAL_TABLET | Freq: Once | ORAL | Status: AC
  Administered 2016-10-30: 40 mg via ORAL
  Filled 2016-10-30: qty 2

## 2016-10-30 MED ORDER — GI COCKTAIL ~~LOC~~
30.0000 mL | ORAL | Status: AC
  Administered 2016-10-30: 30 mL via ORAL
  Filled 2016-10-30: qty 30

## 2016-10-30 MED ORDER — SUCRALFATE 1 G PO TABS: 1.0000 g | ORAL_TABLET | Freq: Four times a day (QID) | ORAL | 1 refills | Status: DC

## 2016-10-30 MED ORDER — FAMOTIDINE 20 MG PO TABS: 20.0000 mg | ORAL_TABLET | Freq: Two times a day (BID) | ORAL | 0 refills | Status: DC

## 2016-10-30 MED ORDER — METOCLOPRAMIDE HCL 10 MG PO TABS: 10.0000 mg | ORAL_TABLET | Freq: Four times a day (QID) | ORAL | 0 refills | Status: DC | PRN

## 2016-10-30 NOTE — ED Triage Notes (Signed)
Pt reports centralized epigastric pain since yesterday morning, reports having a BM or self induced vomiting temporarily relieves the pain. Pt reports improvement with pepto bismol. Pt denies nausea, vomiting, diarrhea or dark stools. Pt reports pain is worse when standing, relieved some when lying down. Pt reports some heavy lifting the night before it started.

## 2016-10-30 NOTE — ED Notes (Signed)
Patient transported to x-ray at this time.   

## 2016-10-30 NOTE — Discharge Instructions (Signed)
Results for orders placed or performed during the hospital encounter of 10/30/16  Lipase, blood  Result Value Ref Range   Lipase 23 11 - 51 U/L  Comprehensive metabolic panel  Result Value Ref Range   Sodium 137 135 - 145 mmol/L   Potassium 3.7 3.5 - 5.1 mmol/L   Chloride 104 101 - 111 mmol/L   CO2 25 22 - 32 mmol/L   Glucose, Bld 130 (H) 65 - 99 mg/dL   BUN 20 6 - 20 mg/dL   Creatinine, Ser 1.04 0.61 - 1.24 mg/dL   Calcium 9.7 8.9 - 10.3 mg/dL   Total Protein 7.6 6.5 - 8.1 g/dL   Albumin 4.2 3.5 - 5.0 g/dL   AST 31 15 - 41 U/L   ALT 19 17 - 63 U/L   Alkaline Phosphatase 47 38 - 126 U/L   Total Bilirubin 0.8 0.3 - 1.2 mg/dL   GFR calc non Af Amer >60 >60 mL/min   GFR calc Af Amer >60 >60 mL/min   Anion gap 8 5 - 15  CBC  Result Value Ref Range   WBC 18.2 (H) 3.8 - 10.6 K/uL   RBC 4.66 4.40 - 5.90 MIL/uL   Hemoglobin 14.4 13.0 - 18.0 g/dL   HCT 42.0 40.0 - 52.0 %   MCV 90.0 80.0 - 100.0 fL   MCH 31.0 26.0 - 34.0 pg   MCHC 34.4 32.0 - 36.0 g/dL   RDW 14.2 11.5 - 14.5 %   Platelets 349 150 - 440 K/uL   Dg Abdomen Acute W/chest  Result Date: 10/30/2016 CLINICAL DATA:  Upper abdomen and epigastric pain since Thursday. EXAM: DG ABDOMEN ACUTE W/ 1V CHEST COMPARISON:  None. FINDINGS: There is no evidence of dilated bowel loops or free intraperitoneal air. Gallstones are identified. There is suggestion of gastric bowel wall thickening. Heart size and mediastinal contours are within normal limits. Both lungs are clear. IMPRESSION: No bowel obstruction or free air. Suggestion of gastric bowel wall thickening. Gallstones are noted. No acute cardiopulmonary disease. Electronically Signed   By: Abelardo Diesel M.D.   On: 10/30/2016 10:14

## 2016-10-30 NOTE — ED Provider Notes (Signed)
[facility] Emergency Department Provider Note  ____________________________________________  Time seen: Approximately 10:09 AM  I have reviewed the triage vital signs and the nursing notes.   HISTORY  Chief Complaint Abdominal Pain    HPI Stevenson Windmiller Aument is a 65 y.o. male who complains of epigastric pain since about 3 AM yesterday morning. Pain was improved a little bit by having bowel movements, improved a lot by vomiting which she induced himself. He then took Pepto-Bismol and felt much much better. Pain was initially 10 out of 10, now about 2 out of 10. Nonradiating, sharp and burning. No aggravating or alleviating factors other than the Pepto-Bismol and vomiting. Compliant with his medications. No trauma. No fevers or chills chest pain or shortness of breath. Patient also reports an associated nonproductive cough.  Had an ultrasound of the abdomen done 1 month ago due to abdominal distention over the last 3 years. It did show gallstones and increased liver echogenicity. No ascites. No AAA.     Past Medical History:  Diagnosis Date  . Anxiety   . Depression   . GERD (gastroesophageal reflux disease)   . Hyperlipidemia   . Hypertension   . Thyroid disease   . Venous insufficiency      Patient Active Problem List   Diagnosis Date Noted  . Reactive airway disease, mild intermittent, uncomplicated 40/11/6759  . Abdominal distension 08/12/2016  . Allergic rhinitis due to pollen 08/13/2015  . Nocturia 08/13/2015  . Atherosclerosis of aorta ([place]) 02/12/2015  . Essential hypertension 02/12/2015  . Esophageal reflux 02/12/2015  . Depression 02/12/2015  . Adult hypothyroidism 02/12/2015  . Chronic anxiety 02/12/2015  . Hyperlipemia 02/12/2015     Past Surgical History:  Procedure Laterality Date  . COLONOSCOPY  2015   Dr Allen Norris- cleared for 5 years   . VEIN SURGERY     2 stents in R) leg     Prior to Admission medications   Medication Sig  Start Date End Date Taking? Authorizing Provider  cetirizine (ZYRTEC) 10 MG tablet Take 10 mg by mouth daily.   Yes [provider]  Choline Fenofibrate (FENOFIBRIC ACID) 135 MG CPDR TAKE 1 CAPSULE BY MOUTH DAILY. 08/13/16  Yes Juline Patch, MD  clonazePAM (KLONOPIN) 0.5 MG tablet Take 1 tablet (0.5 mg total) by mouth daily. Take one half bid 08/12/16  Yes Juline Patch, MD  clopidogrel (PLAVIX) 75 MG tablet Take 1 tablet (75 mg total) by mouth daily. 08/12/16  Yes Juline Patch, MD  dexlansoprazole (DEXILANT) 60 MG capsule Take 1 capsule (60 mg total) by mouth daily. 08/12/16  Yes Juline Patch, MD  levothyroxine (SYNTHROID, LEVOTHROID) 100 MCG tablet Take 1 tablet (100 mcg total) by mouth daily before breakfast. 08/12/16  Yes Juline Patch, MD  losartan (COZAAR) 50 MG tablet TAKE 1 TABLET (50 MG TOTAL) BY MOUTH DAILY. 08/13/16  Yes Juline Patch, MD  sertraline (ZOLOFT) 100 MG tablet Take 1 tablet (100 mg total) by mouth daily. 08/12/16  Yes Juline Patch, MD  simvastatin (ZOCOR) 80 MG tablet Take 1 tablet (80 mg total) by mouth daily. 08/12/16  Yes Juline Patch, MD  albuterol (PROVENTIL HFA;VENTOLIN HFA) 108 (90 Base) MCG/ACT inhaler Inhale 2 puffs into the lungs every 6 (six) hours as needed for wheezing or shortness of breath. 08/12/16   Juline Patch, MD  famotidine (PEPCID) 20 MG tablet Take 1 tablet (20 mg total) by mouth 2 (two) times daily. 10/30/16  Carrie Mew, MD  metoCLOPramide (REGLAN) 10 MG tablet Take 1 tablet (10 mg total) by mouth every 6 (six) hours as needed. 10/30/16   Carrie Mew, MD  sucralfate (CARAFATE) 1 g tablet Take 1 tablet (1 g total) by mouth 4 (four) times daily. 10/30/16   Carrie Mew, MD     Allergies Sulfa antibiotics   Family History  Problem Relation Age of Onset  . Diabetes Mother   . Cancer Father   . Heart disease Father   . Stroke Father     Social History Social History  Substance Use Topics  . Smoking status:  Current Every Day Smoker  . Smokeless tobacco: Never Used  . Alcohol use 0.0 oz/week    Review of Systems  Constitutional:   No fever or chills.  ENT:   Positive sore throat. No rhinorrhea. Cardiovascular:   No chest pain or syncope. Respiratory:   No dyspnea , positive cough. Gastrointestinal:   Positive epigastric pain as above without spontaneous vomiting or diarrhea.  Musculoskeletal:   Negative for focal pain or swelling All other systems reviewed and are negative except as documented above in ROS and HPI.  ____________________________________________   PHYSICAL EXAM:  VITAL SIGNS: ED Triage Vitals  Enc Vitals Group     BP 10/30/16 0930 125/75     Pulse Rate 10/30/16 0930 81     Resp 10/30/16 0930 18     Temp --      Temp src --      SpO2 10/30/16 0930 96 %     Weight 10/30/16 0852 170 lb (77.1 kg)     Height 10/30/16 0852 5\' 8"  (1.727 m)     Head Circumference --      Peak Flow --      Pain Score 10/30/16 0852 4     Pain Loc --      Pain Edu? --      Excl. in [place]? --     Vital signs reviewed, nursing assessments reviewed.   Constitutional:   Alert and oriented. Well appearing and in no distress. Eyes:   No scleral icterus.  EOMI. No nystagmus. No conjunctival pallor. PERRL.Arcus senilis present ENT   Head:   Normocephalic and atraumatic.   Nose:   No congestion/rhinnorhea.    Mouth/Throat:   MMM, no pharyngeal erythema. No peritonsillar mass.    Neck:   No meningismus. Full ROM Hematological/Lymphatic/Immunilogical:   No cervical lymphadenopathy. Cardiovascular:   RRR. Symmetric bilateral radial and DP pulses.  No murmurs.  Respiratory:   Normal respiratory effort without tachypnea/retractions. Breath sounds are clear and equal bilaterally. No wheezes/rales/rhonchi. Gastrointestinal:   Soft with mild epigastric tenderness. Diastasis recti present.. Moderately distended, unchanged from chronic baseline per patient. There is no CVA tenderness.  No  rebound, rigidity, or guarding. Genitourinary:   deferred Musculoskeletal:   Normal range of motion in all extremities. No joint effusions.  No lower extremity tenderness.  No edema. Neurologic:   Normal speech and language.  Motor grossly intact. No gross focal neurologic deficits are appreciated.  Skin:    Skin is warm, dry and intact. No rash noted.  No petechiae, purpura, or bullae.  ____________________________________________    LABS (pertinent positives/negatives) (all labs ordered are listed, but only abnormal results are displayed) Labs Reviewed  COMPREHENSIVE METABOLIC PANEL - Abnormal; Notable for the following:       Result Value   Glucose, Bld 130 (*)    All other components within normal limits  CBC - Abnormal; Notable for the following:    WBC 18.2 (*)    All other components within normal limits  LIPASE, BLOOD  URINALYSIS, COMPLETE (UACMP) WITH MICROSCOPIC   ____________________________________________   EKG  Interpreted by me Sinus tachycardia rate 102, normal axis and intervals. Normal QRS ST segments and T waves.  ____________________________________________    [id number]  Dg Abdomen Acute W/chest  Result Date: 10/30/2016 CLINICAL DATA:  Upper abdomen and epigastric pain since Thursday. EXAM: DG ABDOMEN ACUTE W/ 1V CHEST COMPARISON:  None. FINDINGS: There is no evidence of dilated bowel loops or free intraperitoneal air. Gallstones are identified. There is suggestion of gastric bowel wall thickening. Heart size and mediastinal contours are within normal limits. Both lungs are clear. IMPRESSION: No bowel obstruction or free air. Suggestion of gastric bowel wall thickening. Gallstones are noted. No acute cardiopulmonary disease. Electronically Signed   By: Abelardo Diesel M.D.   On: 10/30/2016 10:14    ____________________________________________   PROCEDURES Procedures  ____________________________________________   INITIAL IMPRESSION / ASSESSMENT AND  PLAN / ED COURSE  Pertinent labs & imaging results that were available during my care of the patient were reviewed by me and considered in my medical decision making (see chart for details).    Clinical Course as of Oct 31 1138  Fri Oct 30, 2016  0950 P/w epigastric pain x 2 days, much better now than initially, improved by Pepto. Low susp of AAA, mes. Ischemia, biliary disease, perf, GIB, ascites, or bowel obstruction. Check AXR series. Further antacids  [PS]    Clinical Course User Index [PS] Carrie Mew, MD     ----------------------------------------- 11:40 AM on 10/30/2016 -----------------------------------------  Abdominal x-ray reveals gastric wall thickening. X-rays otherwise unremarkable, labs unremarkable. Patient tolerating oral intake, feeling better after further antacids. I'll continue him on aggressive antiacid therapy, follow-up with primary care, counseled on diet recommendations for gastritis. Exam is completely inconsistent with biliary disease. No signs or symptoms of significant GI bleeding at this time.  ____________________________________________   FINAL CLINICAL IMPRESSION(S) / ED DIAGNOSES  Final diagnoses:  Left upper quadrant pain  Acute gastritis without hemorrhage, unspecified gastritis type      New Prescriptions   FAMOTIDINE (PEPCID) 20 MG TABLET    Take 1 tablet (20 mg total) by mouth 2 (two) times daily.   METOCLOPRAMIDE (REGLAN) 10 MG TABLET    Take 1 tablet (10 mg total) by mouth every 6 (six) hours as needed.   SUCRALFATE (CARAFATE) 1 G TABLET    Take 1 tablet (1 g total) by mouth 4 (four) times daily.     Portions of this note were generated with dragon dictation software. Dictation errors may occur despite best attempts at proofreading.    Carrie Mew, MD 10/30/16 1141

## 2016-10-30 NOTE — Telephone Encounter (Signed)
Pt came in this morning with upper mid abdominal pain that woke him up Thursday. Described as a sharp pain- he took 2 Tylenol and is on Dexilant 60mg  qday with no relief. Pt had an ultrasound for abdominal distention in April- found to have multiple gallstones and fatty liver. Liver enzymes at that time were within normal limits. Abdomen appears to be very bloated and patient's face is pale in comparison to what his color is normally. Spoke with Dr Ronnald Ramp on phone, as she is out of office today, she said to send to hospital for further eval. Possible GI Bleed/ question of liver disease. Pt states that the "only thing that relieves the pressure for a short period of time is making myself throw up"

## 2016-11-02 ENCOUNTER — Other Ambulatory Visit
Admission: RE | Admit: 2016-11-02 | Discharge: 2016-11-02 | Disposition: A | Discharge status: Home / Self Care | Payer: Medicare Other | Source: Ambulatory Visit | Attending: Family Medicine | Admitting: Family Medicine

## 2016-11-02 ENCOUNTER — Encounter: Payer: Self-pay | Admitting: Family Medicine

## 2016-11-02 ENCOUNTER — Telehealth: Payer: Self-pay

## 2016-11-02 ENCOUNTER — Ambulatory Visit (INDEPENDENT_AMBULATORY_CARE_PROVIDER_SITE_OTHER): Payer: Medicare Other | Admitting: Family Medicine

## 2016-11-02 VITALS — BP 120/64 | HR 100 | Temp 98.0°F | Ht 68.0 in | Wt 171.0 lb

## 2016-11-02 DIAGNOSIS — K8020 Calculus of gallbladder without cholecystitis without obstruction: Secondary | ICD-10-CM | POA: Diagnosis not present

## 2016-11-02 DIAGNOSIS — R634 Abnormal weight loss: Secondary | ICD-10-CM | POA: Insufficient documentation

## 2016-11-02 DIAGNOSIS — R6883 Chills (without fever): Secondary | ICD-10-CM | POA: Diagnosis not present

## 2016-11-02 DIAGNOSIS — R112 Nausea with vomiting, unspecified: Secondary | ICD-10-CM | POA: Diagnosis present

## 2016-11-02 DIAGNOSIS — Z23 Encounter for immunization: Secondary | ICD-10-CM | POA: Diagnosis not present

## 2016-11-02 DIAGNOSIS — R1013 Epigastric pain: Secondary | ICD-10-CM

## 2016-11-02 DIAGNOSIS — T148XXA Other injury of unspecified body region, initial encounter: Secondary | ICD-10-CM

## 2016-11-02 DIAGNOSIS — K760 Fatty (change of) liver, not elsewhere classified: Secondary | ICD-10-CM | POA: Diagnosis not present

## 2016-11-02 DIAGNOSIS — R109 Unspecified abdominal pain: Secondary | ICD-10-CM | POA: Diagnosis present

## 2016-11-02 DIAGNOSIS — K219 Gastro-esophageal reflux disease without esophagitis: Secondary | ICD-10-CM

## 2016-11-02 LAB — HEPATIC FUNCTION PANEL
ALT: 32 U/L (ref 17–63)
AST: 53 U/L — ABNORMAL HIGH (ref 15–41)
Albumin: 3.2 g/dL — ABNORMAL LOW (ref 3.5–5.0)
Alkaline Phosphatase: 54 U/L (ref 38–126)
BILIRUBIN DIRECT: 0.3 mg/dL (ref 0.1–0.5)
BILIRUBIN INDIRECT: 0.5 mg/dL (ref 0.3–0.9)
TOTAL PROTEIN: 7 g/dL (ref 6.5–8.1)
Total Bilirubin: 0.8 mg/dL (ref 0.3–1.2)

## 2016-11-02 LAB — POCT URINALYSIS DIPSTICK
BILIRUBIN UA: NEGATIVE
Glucose, UA: NEGATIVE
KETONES UA: NEGATIVE
Leukocytes, UA: NEGATIVE
NITRITE UA: NEGATIVE
PH UA: 5 (ref 5.0–8.0)
PROTEIN UA: NEGATIVE
Spec Grav, UA: 1.015 (ref 1.010–1.025)
Urobilinogen, UA: 0.2 E.U./dL

## 2016-11-02 LAB — CBC WITH DIFFERENTIAL/PLATELET
Basophils Absolute: 0.1 10*3/uL (ref 0–0.1)
Basophils Relative: 1 %
EOS ABS: 0.1 10*3/uL (ref 0–0.7)
EOS PCT: 1 %
HCT: 38.6 % — ABNORMAL LOW (ref 40.0–52.0)
Hemoglobin: 13.3 g/dL (ref 13.0–18.0)
LYMPHS PCT: 9 %
Lymphs Abs: 1.2 10*3/uL (ref 1.0–3.6)
MCH: 31.2 pg (ref 26.0–34.0)
MCHC: 34.4 g/dL (ref 32.0–36.0)
MCV: 90.5 fL (ref 80.0–100.0)
MONO ABS: 2.4 10*3/uL — AB (ref 0.2–1.0)
MONOS PCT: 17 %
Neutro Abs: 10.2 10*3/uL — ABNORMAL HIGH (ref 1.4–6.5)
Neutrophils Relative %: 72 %
PLATELETS: 326 10*3/uL (ref 150–440)
RBC: 4.26 MIL/uL — AB (ref 4.40–5.90)
RDW: 13.9 % (ref 11.5–14.5)
WBC: 14 10*3/uL — AB (ref 3.8–10.6)

## 2016-11-02 MED ORDER — METRONIDAZOLE 500 MG PO TABS: 500.0000 mg | ORAL_TABLET | Freq: Three times a day (TID) | ORAL | 0 refills | Status: DC

## 2016-11-02 MED ORDER — CEPHALEXIN 500 MG PO CAPS: 500.0000 mg | ORAL_CAPSULE | Freq: Four times a day (QID) | ORAL | 0 refills | Status: DC

## 2016-11-02 MED ORDER — DEXLANSOPRAZOLE 60 MG PO CPDR: 1.0000 | DELAYED_RELEASE_CAPSULE | Freq: Every day | ORAL | 1 refills | Status: DC

## 2016-11-02 NOTE — Progress Notes (Signed)
Name: Joel Flores   MRN: [id number]    DOB: 04-23-1951   Date:11/02/2016       Progress Note  Subjective  Chief Complaint  Chief Complaint  Patient presents with  . Follow-up    hospital visit- Dx gastritis- changed Dexilant to Reglan and Pepcid- wants to go back on Dexilant. Still having pain in upper mid section of abdomen "comes and goes"- last episode of vomitting was Thursday. Pressure is relieved by vomitting.    Abdominal Pain  This is a new problem. The current episode started in the past 7 days (thursday am). The onset quality is sudden. The problem occurs intermittently. The problem has been waxing and waning. The pain is located in the epigastric region. The pain is at a severity of 8/10. The pain is moderate. The quality of the pain is colicky and sharp. The abdominal pain does not radiate. Associated symptoms include weight loss. Pertinent negatives include no belching, constipation, diarrhea, dysuria, fever, flatus, frequency, headaches, hematochezia, hematuria, melena, myalgias or nausea. Associated symptoms comments: chills. The pain is relieved by nothing. He has tried antacids, proton pump inhibitors and H2 blockers (reglan) for the symptoms. The treatment provided mild relief. Prior diagnostic workup includes ultrasound. His past medical history is significant for gallstones and GERD.    No problem-specific Assessment & Plan notes found for this encounter.   Past Medical History:  Diagnosis Date  . Anxiety   . Depression   . GERD (gastroesophageal reflux disease)   . Hyperlipidemia   . Hypertension   . Thyroid disease   . Venous insufficiency     Past Surgical History:  Procedure Laterality Date  . COLONOSCOPY  2015   Dr Allen Norris- cleared for 5 years   . VEIN SURGERY     2 stents in R) leg    Family History  Problem Relation Age of Onset  . Diabetes Mother   . Cancer Father   . Heart disease Father   . Stroke Father     Social History   Social History  .  Marital status: Married    Spouse name: N/A  . Number of children: N/A  . Years of education: N/A   Occupational History  . Not on file.   Social History Main Topics  . Smoking status: Current Every Day Smoker  . Smokeless tobacco: Never Used  . Alcohol use 0.0 oz/week  . Drug use: No  . Sexual activity: No   Other Topics Concern  . Not on file   Social History Narrative  . No narrative on file    Allergies  Allergen Reactions  . Sulfa Antibiotics Rash    Outpatient Medications Prior to Visit  Medication Sig Dispense Refill  . albuterol (PROVENTIL HFA;VENTOLIN HFA) 108 (90 Base) MCG/ACT inhaler Inhale 2 puffs into the lungs every 6 (six) hours as needed for wheezing or shortness of breath. 3 Inhaler 1  . cetirizine (ZYRTEC) 10 MG tablet Take 10 mg by mouth daily.    . Choline Fenofibrate (FENOFIBRIC ACID) 135 MG CPDR TAKE 1 CAPSULE BY MOUTH DAILY. 90 capsule 1  . clonazePAM (KLONOPIN) 0.5 MG tablet Take 1 tablet (0.5 mg total) by mouth daily. Take one half bid 90 tablet 1  . clopidogrel (PLAVIX) 75 MG tablet Take 1 tablet (75 mg total) by mouth daily. 90 tablet 1  . famotidine (PEPCID) 20 MG tablet Take 1 tablet (20 mg total) by mouth 2 (two) times daily. 60 tablet 0  . levothyroxine (  SYNTHROID, LEVOTHROID) 100 MCG tablet Take 1 tablet (100 mcg total) by mouth daily before breakfast. 90 tablet 1  . losartan (COZAAR) 50 MG tablet TAKE 1 TABLET (50 MG TOTAL) BY MOUTH DAILY. 90 tablet 1  . metoCLOPramide (REGLAN) 10 MG tablet Take 1 tablet (10 mg total) by mouth every 6 (six) hours as needed. 30 tablet 0  . sertraline (ZOLOFT) 100 MG tablet Take 1 tablet (100 mg total) by mouth daily. 90 tablet 1  . simvastatin (ZOCOR) 80 MG tablet Take 1 tablet (80 mg total) by mouth daily. 90 tablet 1  . sucralfate (CARAFATE) 1 g tablet Take 1 tablet (1 g total) by mouth 4 (four) times daily. 120 tablet 1  . dexlansoprazole (DEXILANT) 60 MG capsule Take 1 capsule (60 mg total) by mouth daily.  (Patient not taking: Reported on 11/02/2016) 90 capsule 1   No facility-administered medications prior to visit.     Review of Systems  Constitutional: Positive for weight loss. Negative for chills, fever and malaise/fatigue.  HENT: Negative for ear discharge, ear pain and sore throat.   Eyes: Negative for blurred vision.  Respiratory: Negative for cough, sputum production, shortness of breath and wheezing.   Cardiovascular: Negative for chest pain, palpitations and leg swelling.  Gastrointestinal: Positive for abdominal pain. Negative for blood in stool, constipation, diarrhea, flatus, heartburn, hematochezia, melena and nausea.  Genitourinary: Negative for dysuria, frequency, hematuria and urgency.  Musculoskeletal: Negative for back pain, joint pain, myalgias and neck pain.  Skin: Negative for rash.  Neurological: Negative for dizziness, tingling, sensory change, focal weakness and headaches.  Endo/Heme/Allergies: Negative for environmental allergies and polydipsia. Does not bruise/bleed easily.  Psychiatric/Behavioral: Negative for depression and suicidal ideas. The patient is not nervous/anxious and does not have insomnia.      Objective  Vitals:   11/02/16 0842  BP: 120/64  Pulse: 100  Temp: 98 F (36.7 C)  TempSrc: Oral  Weight: 171 lb (77.6 kg)  Height: 5\' 8"  (1.727 m)    Physical Exam  Constitutional: He is oriented to person, place, and time and well-developed, well-nourished, and in no distress.  HENT:  Head: Normocephalic.  Right Ear: External ear normal.  Left Ear: External ear normal.  Nose: Nose normal.  Mouth/Throat: Oropharynx is clear and moist.  Eyes: Conjunctivae and EOM are normal. Pupils are equal, round, and reactive to light. Right eye exhibits no discharge. Left eye exhibits no discharge. No scleral icterus.  Neck: Normal range of motion. Neck supple. No JVD present. No tracheal deviation present. No thyromegaly present.  Cardiovascular: Normal rate,  regular rhythm, normal heart sounds and intact distal pulses.  Exam reveals no gallop and no friction rub.   No murmur heard. Pulmonary/Chest: Breath sounds normal. No respiratory distress. He has no wheezes. He has no rales.  Abdominal: Soft. Bowel sounds are normal. He exhibits distension. He exhibits no mass. There is no hepatosplenomegaly. There is tenderness in the epigastric area. There is no rebound, no guarding and no CVA tenderness.  Musculoskeletal: Normal range of motion. He exhibits no edema or tenderness.  Lymphadenopathy:    He has no cervical adenopathy.  Neurological: He is alert and oriented to person, place, and time. He has normal sensation, normal strength, normal reflexes and intact cranial nerves. No cranial nerve deficit.  Skin: Skin is warm. Bruising noted. No rash noted.  abrasion  Psychiatric: Mood and affect normal.  Nursing note and vitals reviewed.     Assessment & Plan  Problem List  Items Addressed This Visit      Digestive   Esophageal reflux   Relevant Medications   dexlansoprazole (DEXILANT) 60 MG capsule    Other Visit Diagnoses    Epigastric pain    -  Primary   cbc/hepatic panel   Relevant Medications   cephALEXin (KEFLEX) 500 MG capsule   metroNIDAZOLE (FLAGYL) 500 MG tablet   Other Relevant Orders   CT Abdomen Pelvis W Contrast   POCT urinalysis dipstick (Completed)   Abnormal weight loss       Relevant Orders   CT Abdomen Pelvis W Contrast   POCT urinalysis dipstick (Completed)   Steatosis of liver       Gall stones       Abrasion       left hand   Relevant Orders   Tdap vaccine greater than or equal to 7yo IM (Completed)   Need for diphtheria-tetanus-pertussis (Tdap) vaccine       Relevant Orders   Tdap vaccine greater than or equal to 7yo IM (Completed)   Need for vaccination against Streptococcus pneumoniae using pneumococcal conjugate vaccine 13       Relevant Orders   Pneumococcal conjugate vaccine 13-valent IM (Completed)       Meds ordered this encounter  Medications  . dexlansoprazole (DEXILANT) 60 MG capsule    Sig: Take 1 capsule (60 mg total) by mouth daily.    Dispense:  90 capsule    Refill:  1  . cephALEXin (KEFLEX) 500 MG capsule    Sig: Take 1 capsule (500 mg total) by mouth 4 (four) times daily.    Dispense:  40 capsule    Refill:  0  . metroNIDAZOLE (FLAGYL) 500 MG tablet    Sig: Take 1 tablet (500 mg total) by mouth 3 (three) times daily.    Dispense:  30 tablet    Refill:  0      Dr. Mirian Casco [facility]  11/02/16

## 2016-11-02 NOTE — Telephone Encounter (Signed)
Called and LM stating temp of 102. I called him back to advise ER per Ronnald Ramp and he had started his Abx and temp was lower to 100.3. I advised him to call us tomorrow to check in but if temp goes up again to go to ER due to WBC issues per Jones. He agreed.

## 2016-11-02 NOTE — Telephone Encounter (Deleted)
Left message- has 100.2 temp. He got a call from [facility] left no message and wants to know if you called in any meds. Also wants to know about labs.

## 2016-11-05 ENCOUNTER — Ambulatory Visit: Admission: RE | Admit: 2016-11-05 | Payer: Medicare Other | Source: Ambulatory Visit

## 2016-11-09 ENCOUNTER — Ambulatory Visit: Payer: Medicare Other

## 2016-11-13 ENCOUNTER — Encounter (INDEPENDENT_AMBULATORY_CARE_PROVIDER_SITE_OTHER): Payer: Self-pay

## 2016-11-13 ENCOUNTER — Ambulatory Visit (INDEPENDENT_AMBULATORY_CARE_PROVIDER_SITE_OTHER): Payer: Self-pay | Admitting: Vascular Surgery

## 2016-11-16 ENCOUNTER — Other Ambulatory Visit (INDEPENDENT_AMBULATORY_CARE_PROVIDER_SITE_OTHER): Payer: Self-pay | Admitting: Vascular Surgery

## 2016-11-16 DIAGNOSIS — I700 Atherosclerosis of aorta: Secondary | ICD-10-CM

## 2016-11-16 DIAGNOSIS — I739 Peripheral vascular disease, unspecified: Secondary | ICD-10-CM

## 2016-11-16 DIAGNOSIS — I708 Atherosclerosis of other arteries: Principal | ICD-10-CM

## 2016-11-17 ENCOUNTER — Ambulatory Visit (INDEPENDENT_AMBULATORY_CARE_PROVIDER_SITE_OTHER): Payer: Medicare Other

## 2016-11-17 ENCOUNTER — Encounter (INDEPENDENT_AMBULATORY_CARE_PROVIDER_SITE_OTHER): Payer: Self-pay

## 2016-11-17 ENCOUNTER — Encounter (INDEPENDENT_AMBULATORY_CARE_PROVIDER_SITE_OTHER): Payer: Self-pay | Admitting: Vascular Surgery

## 2016-11-17 ENCOUNTER — Ambulatory Visit (INDEPENDENT_AMBULATORY_CARE_PROVIDER_SITE_OTHER): Payer: Medicare Other | Admitting: Vascular Surgery

## 2016-11-17 VITALS — BP 109/75 | HR 100 | Resp 16 | Ht 68.0 in | Wt 162.0 lb

## 2016-11-17 DIAGNOSIS — I10 Essential (primary) hypertension: Secondary | ICD-10-CM | POA: Diagnosis not present

## 2016-11-17 DIAGNOSIS — I739 Peripheral vascular disease, unspecified: Secondary | ICD-10-CM

## 2016-11-17 DIAGNOSIS — F17210 Nicotine dependence, cigarettes, uncomplicated: Secondary | ICD-10-CM | POA: Diagnosis not present

## 2016-11-17 DIAGNOSIS — I700 Atherosclerosis of aorta: Secondary | ICD-10-CM

## 2016-11-17 DIAGNOSIS — F17200 Nicotine dependence, unspecified, uncomplicated: Secondary | ICD-10-CM

## 2016-11-17 HISTORY — DX: Peripheral vascular disease, unspecified: I73.9

## 2016-11-17 NOTE — Patient Instructions (Signed)

## 2016-11-17 NOTE — Progress Notes (Signed)
MRN : [id number]  Joel Flores is a 65 y.o. ([birth year]) male who presents with chief complaint of  Chief Complaint  Patient presents with  . Follow-up    ultrasound  .  History of Present Illness: Patient returns today in follow up of Iliac stenosis. He remains well and has no complaints today. He is about 10 years status post iliac intervention for claudication with no recurrent symptoms. He denies rotational infection. He continues to smoke. His noninvasive studies today demonstrate normal ABIs of approximately 1 bilaterally with good wave forms consistent efficiency.  Current Outpatient Prescriptions  Medication Sig Dispense Refill  . Choline Fenofibrate (FENOFIBRIC ACID) 135 MG CPDR TAKE 1 CAPSULE BY MOUTH DAILY. 90 capsule 1  . clonazePAM (KLONOPIN) 0.5 MG tablet Take 1 tablet (0.5 mg total) by mouth daily. Take one half bid 90 tablet 1  . clopidogrel (PLAVIX) 75 MG tablet Take 1 tablet (75 mg total) by mouth daily. 90 tablet 1  . dexlansoprazole (DEXILANT) 60 MG capsule Take 1 capsule (60 mg total) by mouth daily. 90 capsule 1  . levothyroxine (SYNTHROID, LEVOTHROID) 100 MCG tablet Take 1 tablet (100 mcg total) by mouth daily before breakfast. 90 tablet 1  . losartan (COZAAR) 50 MG tablet TAKE 1 TABLET (50 MG TOTAL) BY MOUTH DAILY. 90 tablet 1  . sertraline (ZOLOFT) 100 MG tablet Take 1 tablet (100 mg total) by mouth daily. 90 tablet 1  . simvastatin (ZOCOR) 80 MG tablet Take 1 tablet (80 mg total) by mouth daily. 90 tablet 1  . albuterol (PROVENTIL HFA;VENTOLIN HFA) 108 (90 Base) MCG/ACT inhaler Inhale 2 puffs into the lungs every 6 (six) hours as needed for wheezing or shortness of breath. (Patient not taking: Reported on 11/17/2016) 3 Inhaler 1  . cephALEXin (KEFLEX) 500 MG capsule Take 1 capsule (500 mg total) by mouth 4 (four) times daily. (Patient not taking: Reported on 11/17/2016) 40 capsule 0  . cetirizine (ZYRTEC) 10 MG tablet Take 10 mg by mouth daily.    . famotidine  (PEPCID) 20 MG tablet Take 1 tablet (20 mg total) by mouth 2 (two) times daily. (Patient not taking: Reported on 11/17/2016) 60 tablet 0  . metoCLOPramide (REGLAN) 10 MG tablet Take 1 tablet (10 mg total) by mouth every 6 (six) hours as needed. (Patient not taking: Reported on 11/17/2016) 30 tablet 0  . metroNIDAZOLE (FLAGYL) 500 MG tablet Take 1 tablet (500 mg total) by mouth 3 (three) times daily. (Patient not taking: Reported on 11/17/2016) 30 tablet 0  . sucralfate (CARAFATE) 1 g tablet Take 1 tablet (1 g total) by mouth 4 (four) times daily. (Patient not taking: Reported on 11/17/2016) 120 tablet 1   No current facility-administered medications for this visit.     Past Medical History:  Diagnosis Date  . Anxiety   . Depression   . GERD (gastroesophageal reflux disease)   . Hyperlipidemia   . Hypertension   . Thyroid disease   . Venous insufficiency     Past Surgical History:  Procedure Laterality Date  . COLONOSCOPY  2015   Dr Allen Norris- cleared for 5 years   . VEIN SURGERY     2 stents in R) leg    Social History Social History  Substance Use Topics  . Smoking status: Current Every Day Smoker  . Smokeless tobacco: Never Used  . Alcohol use 0.0 oz/week      Family History Family History  Problem Relation Age of Onset  . Diabetes  Mother   . Cancer Father   . Heart disease Father   . Stroke Father      Allergies  Allergen Reactions  . Sulfa Antibiotics Rash     REVIEW OF SYSTEMS (Negative unless checked)  Constitutional: '[]' Weight loss  '[]' Fever  '[]' Chills Cardiac: '[]' Chest pain   '[]' Chest pressure   '[]' Palpitations   '[]' Shortness of breath when laying flat   '[]' Shortness of breath at rest   '[]' Shortness of breath with exertion. Vascular:  '[]' Pain in legs with walking   '[]' Pain in legs at rest   '[]' Pain in legs when laying flat   '[]' Claudication   '[]' Pain in feet when walking  '[]' Pain in feet at rest  '[]' Pain in feet when laying flat   '[]' History of DVT   '[]' Phlebitis   '[]' Swelling in  legs   '[]' Varicose veins   '[]' Non-healing ulcers Pulmonary:   '[]' Uses home oxygen   '[]' Productive cough   '[]' Hemoptysis   '[]' Wheeze  '[]' COPD   '[]' Asthma Neurologic:  '[]' Dizziness  '[]' Blackouts   '[]' Seizures   '[]' History of stroke   '[]' History of TIA  '[]' Aphasia   '[]' Temporary blindness   '[]' Dysphagia   '[]' Weakness or numbness in arms   '[]' Weakness or numbness in legs Musculoskeletal:  '[]' Arthritis   '[]' Joint swelling   '[]' Joint pain   '[]' Low back pain Hematologic:  '[]' Easy bruising  '[]' Easy bleeding   '[]' Hypercoagulable state   '[]' Anemic   Gastrointestinal:  '[]' Blood in stool   '[]' Vomiting blood  '[]' Gastroesophageal reflux/heartburn   '[x]' Abdominal pain Genitourinary:  '[]' Chronic kidney disease   '[]' Difficult urination  '[]' Frequent urination  '[]' Burning with urination   '[]' Hematuria Skin:  '[]' Rashes   '[]' Ulcers   '[]' Wounds Psychological:  '[]' History of anxiety   '[]'  History of major depression.  Physical Examination  BP 109/75   Pulse 100   Resp 16   Ht '5\' 8"'  (1.727 m)   Wt 162 lb (73.5 kg)   BMI 24.63 kg/m  Gen:  WD/WN, NAD Head: [place]/AT, No temporalis wasting. Ear/Nose/Throat: Hearing grossly intact, nares w/o erythema or drainage, trachea midline Eyes: Conjunctiva clear. Sclera non-icteric Neck: Supple.  No JVD.  Pulmonary:  Good air movement, no use of accessory muscles.  Cardiac: RRR, normal S1, S2 Vascular:  Vessel Right Left  Radial Palpable Palpable                          PT Palpable Palpable  DP Palpable Palpable    Musculoskeletal: M/S 5/5 throughout.  No deformity or atrophy.  Neurologic: Sensation grossly intact in extremities.  Symmetrical.  Speech is fluent.  Psychiatric: Judgment intact, Mood & affect appropriate for pt's clinical situation. Dermatologic: No rashes or ulcers noted.  No cellulitis or open wounds.       Labs Recent Results (from the past 2160 hour(s))  Lipase, blood     Status: None   Collection Time: 10/30/16  9:04 AM  Result Value Ref Range   Lipase 23 11 - 51 U/L    Comprehensive metabolic panel     Status: Abnormal   Collection Time: 10/30/16  9:04 AM  Result Value Ref Range   Sodium 137 135 - 145 mmol/L   Potassium 3.7 3.5 - 5.1 mmol/L   Chloride 104 101 - 111 mmol/L   CO2 25 22 - 32 mmol/L   Glucose, Bld 130 (H) 65 - 99 mg/dL   BUN 20 6 - 20 mg/dL   Creatinine, Ser 1.04 0.61 - 1.24 mg/dL   Calcium  9.7 8.9 - 10.3 mg/dL   Total Protein 7.6 6.5 - 8.1 g/dL   Albumin 4.2 3.5 - 5.0 g/dL   AST 31 15 - 41 U/L   ALT 19 17 - 63 U/L   Alkaline Phosphatase 47 38 - 126 U/L   Total Bilirubin 0.8 0.3 - 1.2 mg/dL   GFR calc non Af Amer >60 >60 mL/min   GFR calc Af Amer >60 >60 mL/min    Comment: (NOTE) The eGFR has been calculated using the CKD EPI equation. This calculation has not been validated in all clinical situations. eGFR's persistently <60 mL/min signify possible Chronic Kidney Disease.    Anion gap 8 5 - 15  CBC     Status: Abnormal   Collection Time: 10/30/16  9:04 AM  Result Value Ref Range   WBC 18.2 (H) 3.8 - 10.6 K/uL   RBC 4.66 4.40 - 5.90 MIL/uL   Hemoglobin 14.4 13.0 - 18.0 g/dL   HCT 42.0 40.0 - 52.0 %   MCV 90.0 80.0 - 100.0 fL   MCH 31.0 26.0 - 34.0 pg   MCHC 34.4 32.0 - 36.0 g/dL   RDW 14.2 11.5 - 14.5 %   Platelets 349 150 - 440 K/uL  CBC with Differential/Platelet     Status: Abnormal   Collection Time: 11/02/16  9:50 AM  Result Value Ref Range   WBC 14.0 (H) 3.8 - 10.6 K/uL   RBC 4.26 (L) 4.40 - 5.90 MIL/uL   Hemoglobin 13.3 13.0 - 18.0 g/dL   HCT 38.6 (L) 40.0 - 52.0 %   MCV 90.5 80.0 - 100.0 fL   MCH 31.2 26.0 - 34.0 pg   MCHC 34.4 32.0 - 36.0 g/dL   RDW 13.9 11.5 - 14.5 %   Platelets 326 150 - 440 K/uL   Neutrophils Relative % 72 %   Neutro Abs 10.2 (H) 1.4 - 6.5 K/uL   Lymphocytes Relative 9 %   Lymphs Abs 1.2 1.0 - 3.6 K/uL   Monocytes Relative 17 %   Monocytes Absolute 2.4 (H) 0.2 - 1.0 K/uL   Eosinophils Relative 1 %   Eosinophils Absolute 0.1 0 - 0.7 K/uL   Basophils Relative 1 %   Basophils  Absolute 0.1 0 - 0.1 K/uL  Hepatic function panel     Status: Abnormal   Collection Time: 11/02/16  9:50 AM  Result Value Ref Range   Total Protein 7.0 6.5 - 8.1 g/dL   Albumin 3.2 (L) 3.5 - 5.0 g/dL   AST 53 (H) 15 - 41 U/L   ALT 32 17 - 63 U/L   Alkaline Phosphatase 54 38 - 126 U/L   Total Bilirubin 0.8 0.3 - 1.2 mg/dL   Bilirubin, Direct 0.3 0.1 - 0.5 mg/dL   Indirect Bilirubin 0.5 0.3 - 0.9 mg/dL  POCT urinalysis dipstick     Status: None   Collection Time: 11/02/16 11:13 AM  Result Value Ref Range   Color, UA yellow    Clarity, UA clear    Glucose, UA negative    Bilirubin, UA negative    Ketones, UA negative    Spec Grav, UA 1.015 1.010 - 1.025   Blood, UA hem trace    pH, UA 5.0 5.0 - 8.0   Protein, UA negative    Urobilinogen, UA 0.2 0.2 or 1.0 E.U./dL   Nitrite, UA negative    Leukocytes, UA Negative Negative    Radiology Dg Abdomen Acute W/chest  Result Date: 10/30/2016 CLINICAL DATA:  Upper abdomen and epigastric pain since Thursday. EXAM: DG ABDOMEN ACUTE W/ 1V CHEST COMPARISON:  None. FINDINGS: There is no evidence of dilated bowel loops or free intraperitoneal air. Gallstones are identified. There is suggestion of gastric bowel wall thickening. Heart size and mediastinal contours are within normal limits. Both lungs are clear. IMPRESSION: No bowel obstruction or free air. Suggestion of gastric bowel wall thickening. Gallstones are noted. No acute cardiopulmonary disease. Electronically Signed   By: Abelardo Diesel M.D.   On: 10/30/2016 10:14     Assessment/Plan  Essential hypertension blood pressure control important in reducing the progression of atherosclerotic disease. On appropriate oral medications.   Atherosclerosis of aorta (HCC) His noninvasive studies today demonstrate normal ABIs of approximately 1 bilaterally with good wave forms consistent efficiency.  PVD (peripheral vascular disease) ([place]) He is about 10 years status post right iliac stent  placement. His noninvasive studies today demonstrate normal ABIs of approximately 1 bilaterally with good wave forms consistent efficiency. Smoking cessation recommended. Recheck in 1 year  Tobacco use disorder We had a discussion for approximately 4 minutes regarding the absolute need for smoking cessation due to the deleterious nature of tobacco on the vascular system. We discussed the tobacco use would diminish patency of any intervention, and likely significantly worsen progressio of disease. We discussed multiple agents for quitting including replacement therapy or medications to reduce cravings such as Chantix. The patient voices their understanding of the importance of smoking cessation.     Leotis Pain, MD  11/17/2016 11:54 AM    This note was created with Dragon medical transcription system.  Any errors from dictation are purely unintentional

## 2016-11-17 NOTE — Assessment & Plan Note (Signed)
He is about 10 years status post right iliac stent placement. His noninvasive studies today demonstrate normal ABIs of approximately 1 bilaterally with good wave forms consistent efficiency. Smoking cessation recommended. Recheck in 1 year

## 2016-11-17 NOTE — Assessment & Plan Note (Signed)
His noninvasive studies today demonstrate normal ABIs of approximately 1 bilaterally with good wave forms consistent efficiency.

## 2016-11-17 NOTE — Assessment & Plan Note (Signed)
blood pressure control important in reducing the progression of atherosclerotic disease. On appropriate oral medications.  

## 2016-11-17 NOTE — Assessment & Plan Note (Signed)
We had a discussion for approximately 4 minutes regarding the absolute need for smoking cessation due to the deleterious nature of tobacco on the vascular system. We discussed the tobacco use would diminish patency of any intervention, and likely significantly worsen progressio of disease. We discussed multiple agents for quitting including replacement therapy or medications to reduce cravings such as Chantix. The patient voices their understanding of the importance of smoking cessation.  

## 2017-01-29 ENCOUNTER — Encounter: Payer: Self-pay | Admitting: Family Medicine

## 2017-01-29 ENCOUNTER — Ambulatory Visit (INDEPENDENT_AMBULATORY_CARE_PROVIDER_SITE_OTHER): Payer: Medicare Other | Admitting: Family Medicine

## 2017-01-29 VITALS — BP 120/70 | HR 84 | Ht 68.0 in | Wt 165.0 lb

## 2017-01-29 DIAGNOSIS — F419 Anxiety disorder, unspecified: Secondary | ICD-10-CM

## 2017-01-29 DIAGNOSIS — I10 Essential (primary) hypertension: Secondary | ICD-10-CM

## 2017-01-29 DIAGNOSIS — E782 Mixed hyperlipidemia: Secondary | ICD-10-CM

## 2017-01-29 DIAGNOSIS — Z23 Encounter for immunization: Secondary | ICD-10-CM

## 2017-01-29 DIAGNOSIS — I700 Atherosclerosis of aorta: Secondary | ICD-10-CM | POA: Diagnosis not present

## 2017-01-29 DIAGNOSIS — E039 Hypothyroidism, unspecified: Secondary | ICD-10-CM | POA: Diagnosis not present

## 2017-01-29 DIAGNOSIS — K219 Gastro-esophageal reflux disease without esophagitis: Secondary | ICD-10-CM | POA: Diagnosis not present

## 2017-01-29 DIAGNOSIS — F3341 Major depressive disorder, recurrent, in partial remission: Secondary | ICD-10-CM

## 2017-01-29 MED ORDER — LOSARTAN POTASSIUM 50 MG PO TABS: 50.0000 mg | ORAL_TABLET | Freq: Every day | ORAL | 1 refills | Status: DC

## 2017-01-29 MED ORDER — SIMVASTATIN 80 MG PO TABS: 80.0000 mg | ORAL_TABLET | Freq: Every day | ORAL | 1 refills | Status: DC

## 2017-01-29 MED ORDER — SERTRALINE HCL 100 MG PO TABS: 100.0000 mg | ORAL_TABLET | Freq: Every day | ORAL | 1 refills | Status: DC

## 2017-01-29 MED ORDER — LEVOTHYROXINE SODIUM 100 MCG PO TABS: 100.0000 ug | ORAL_TABLET | Freq: Every day | ORAL | 1 refills | Status: DC

## 2017-01-29 MED ORDER — CLONAZEPAM 0.5 MG PO TABS: 0.5000 mg | ORAL_TABLET | Freq: Every day | ORAL | 1 refills | Status: DC

## 2017-01-29 MED ORDER — CETIRIZINE HCL 10 MG PO TABS: 10.0000 mg | ORAL_TABLET | Freq: Every day | ORAL | 11 refills | Status: DC

## 2017-01-29 MED ORDER — FENOFIBRIC ACID 135 MG PO CPDR: 1.0000 | DELAYED_RELEASE_CAPSULE | Freq: Every day | ORAL | 1 refills | Status: DC

## 2017-01-29 MED ORDER — DEXLANSOPRAZOLE 60 MG PO CPDR: 1.0000 | DELAYED_RELEASE_CAPSULE | Freq: Every day | ORAL | 1 refills | Status: DC

## 2017-01-29 MED ORDER — CLOPIDOGREL BISULFATE 75 MG PO TABS: 75.0000 mg | ORAL_TABLET | Freq: Every day | ORAL | 1 refills | Status: DC

## 2017-01-29 NOTE — Progress Notes (Signed)
Name: Joel Flores   MRN: [id number]    DOB: 03/21/1951   Date:01/29/2017       Progress Note  Subjective  Chief Complaint  Chief Complaint  Patient presents with  . Hypertension  . Hyperlipidemia  . Anxiety  . Depression  . Hypothyroidism  . Gastroesophageal Reflux  . Venous Insufficiency    takes plavix    Hypertension  This is a chronic problem. The current episode started more than 1 year ago. The problem has been gradually worsening since onset. The problem is controlled. Associated symptoms include anxiety. Pertinent negatives include no blurred vision, chest pain, headaches, malaise/fatigue, neck pain, orthopnea, palpitations, peripheral edema, PND, shortness of breath or sweats. There are no associated agents to hypertension. There are no known risk factors for coronary artery disease. Past treatments include angiotensin blockers. The current treatment provides moderate improvement. There are no compliance problems.  There is no history of angina, kidney disease, CAD/MI, CVA, heart failure, left ventricular hypertrophy, PVD or retinopathy. Identifiable causes of hypertension include a thyroid problem. There is no history of chronic renal disease, a hypertension causing med or renovascular disease.  Hyperlipidemia  This is a chronic problem. The problem is controlled. Recent lipid tests were reviewed and are normal. Exacerbating diseases include obesity. He has no history of chronic renal disease. Factors aggravating his hyperlipidemia include thiazides. Pertinent negatives include no chest pain, focal weakness, myalgias or shortness of breath. Current antihyperlipidemic treatment includes statins and fibric acid derivatives. The current treatment provides mild improvement of lipids. There are no compliance problems.  Risk factors for coronary artery disease include dyslipidemia and hypertension.  Anxiety  Presents for follow-up visit. Symptoms include irritability and nervous/anxious  behavior. Patient reports no chest pain, confusion, decreased concentration, depressed mood, dizziness, excessive worry, insomnia, nausea, palpitations, panic, restlessness, shortness of breath or suicidal ideas. The severity of symptoms is mild. The quality of sleep is good.   There is no history of depression.  Depression         The patient presents with no depression.  This is a chronic problem.  The current episode started more than 1 year ago.   The onset quality is gradual.   The problem has been gradually improving since onset.  Associated symptoms include irritable.  Associated symptoms include no decreased concentration, no fatigue, no helplessness, no hopelessness, does not have insomnia, no restlessness, no decreased interest, no body aches, no myalgias, no headaches, not sad and no suicidal ideas.  Past treatments include SSRIs - Selective serotonin reuptake inhibitors (klonapin).  Compliance with treatment is good.  Previous treatment provided mild relief.  Past medical history includes thyroid problem and anxiety.     Pertinent negatives include no chronic fatigue syndrome and no depression. Gastroesophageal Reflux  He reports no abdominal pain, no belching, no chest pain, no choking, no coughing, no heartburn, no nausea, no sore throat, no stridor or no wheezing. This is a recurrent problem. The problem has been unchanged. The symptoms are aggravated by certain foods. Pertinent negatives include no fatigue, melena or weight loss. He has tried a PPI for the symptoms.  Thyroid Problem  Presents for follow-up visit. Symptoms include anxiety. Patient reports no cold intolerance, constipation, depressed mood, diaphoresis, diarrhea, dry skin, fatigue, hair loss, heat intolerance, palpitations, weight gain or weight loss. The symptoms have been stable. His past medical history is significant for hyperlipidemia. There is no history of heart failure.    No problem-specific Assessment & Plan notes  found for this encounter.   Past Medical History:  Diagnosis Date  . Anxiety   . Depression   . GERD (gastroesophageal reflux disease)   . Hyperlipidemia   . Hypertension   . Thyroid disease   . Venous insufficiency     Past Surgical History:  Procedure Laterality Date  . COLONOSCOPY  2015   Dr Allen Norris- cleared for 5 years   . VEIN SURGERY     2 stents in R) leg    Family History  Problem Relation Age of Onset  . Diabetes Mother   . Cancer Father   . Heart disease Father   . Stroke Father     Social History   Social History  . Marital status: Married    Spouse name: N/A  . Number of children: N/A  . Years of education: N/A   Occupational History  . Not on file.   Social History Main Topics  . Smoking status: Current Every Day Smoker  . Smokeless tobacco: Never Used  . Alcohol use 0.0 oz/week  . Drug use: No  . Sexual activity: No   Other Topics Concern  . Not on file   Social History Narrative  . No narrative on file    Allergies  Allergen Reactions  . Sulfa Antibiotics Rash    Outpatient Medications Prior to Visit  Medication Sig Dispense Refill  . cetirizine (ZYRTEC) 10 MG tablet Take 10 mg by mouth daily.    . Choline Fenofibrate (FENOFIBRIC ACID) 135 MG CPDR TAKE 1 CAPSULE BY MOUTH DAILY. 90 capsule 1  . clonazePAM (KLONOPIN) 0.5 MG tablet Take 1 tablet (0.5 mg total) by mouth daily. Take one half bid (Patient taking differently: Take 0.5 mg by mouth daily. ) 90 tablet 1  . clopidogrel (PLAVIX) 75 MG tablet Take 1 tablet (75 mg total) by mouth daily. 90 tablet 1  . dexlansoprazole (DEXILANT) 60 MG capsule Take 1 capsule (60 mg total) by mouth daily. 90 capsule 1  . levothyroxine (SYNTHROID, LEVOTHROID) 100 MCG tablet Take 1 tablet (100 mcg total) by mouth daily before breakfast. 90 tablet 1  . losartan (COZAAR) 50 MG tablet TAKE 1 TABLET (50 MG TOTAL) BY MOUTH DAILY. 90 tablet 1  . sertraline (ZOLOFT) 100 MG tablet Take 1 tablet (100 mg total) by  mouth daily. 90 tablet 1  . simvastatin (ZOCOR) 80 MG tablet Take 1 tablet (80 mg total) by mouth daily. 90 tablet 1  . albuterol (PROVENTIL HFA;VENTOLIN HFA) 108 (90 Base) MCG/ACT inhaler Inhale 2 puffs into the lungs every 6 (six) hours as needed for wheezing or shortness of breath. (Patient not taking: Reported on 11/17/2016) 3 Inhaler 1  . cephALEXin (KEFLEX) 500 MG capsule Take 1 capsule (500 mg total) by mouth 4 (four) times daily. (Patient not taking: Reported on 11/17/2016) 40 capsule 0  . famotidine (PEPCID) 20 MG tablet Take 1 tablet (20 mg total) by mouth 2 (two) times daily. (Patient not taking: Reported on 11/17/2016) 60 tablet 0  . metoCLOPramide (REGLAN) 10 MG tablet Take 1 tablet (10 mg total) by mouth every 6 (six) hours as needed. (Patient not taking: Reported on 11/17/2016) 30 tablet 0  . metroNIDAZOLE (FLAGYL) 500 MG tablet Take 1 tablet (500 mg total) by mouth 3 (three) times daily. (Patient not taking: Reported on 11/17/2016) 30 tablet 0  . sucralfate (CARAFATE) 1 g tablet Take 1 tablet (1 g total) by mouth 4 (four) times daily. (Patient not taking: Reported on 11/17/2016) 120 tablet  1   No facility-administered medications prior to visit.     Review of Systems  Constitutional: Positive for irritability. Negative for chills, diaphoresis, fatigue, fever, malaise/fatigue, weight gain and weight loss.  HENT: Negative for ear discharge, ear pain and sore throat.   Eyes: Negative for blurred vision.  Respiratory: Negative for cough, sputum production, choking, shortness of breath and wheezing.   Cardiovascular: Negative for chest pain, palpitations, orthopnea, leg swelling and PND.  Gastrointestinal: Negative for abdominal pain, blood in stool, constipation, diarrhea, heartburn, melena and nausea.  Genitourinary: Negative for dysuria, frequency, hematuria and urgency.  Musculoskeletal: Negative for back pain, joint pain, myalgias and neck pain.  Skin: Negative for rash.   Neurological: Negative for dizziness, tingling, sensory change, focal weakness and headaches.  Endo/Heme/Allergies: Negative for environmental allergies, cold intolerance, heat intolerance and polydipsia. Does not bruise/bleed easily.  Psychiatric/Behavioral: Positive for depression. Negative for confusion, decreased concentration and suicidal ideas. The patient is nervous/anxious. The patient does not have insomnia.      Objective  Vitals:   01/29/17 0832  BP: 120/70  Pulse: 84  Weight: 165 lb (74.8 kg)  Height: 5\' 8"  (1.727 m)    Physical Exam  Constitutional: He is oriented to person, place, and time and well-developed, well-nourished, and in no distress. He is irritable.  HENT:  Head: Normocephalic.  Right Ear: External ear normal.  Left Ear: External ear normal.  Nose: Nose normal.  Mouth/Throat: Oropharynx is clear and moist.  Eyes: Pupils are equal, round, and reactive to light. Conjunctivae and EOM are normal. Right eye exhibits no discharge. Left eye exhibits no discharge. No scleral icterus.  Neck: Normal range of motion. Neck supple. No JVD present. No tracheal deviation present. No thyromegaly present.  Cardiovascular: Normal rate, regular rhythm, normal heart sounds and intact distal pulses.  Exam reveals no gallop and no friction rub.   No murmur heard. Pulmonary/Chest: Breath sounds normal. No respiratory distress. He has no wheezes. He has no rales.  Abdominal: Soft. Bowel sounds are normal. He exhibits no mass. There is no hepatosplenomegaly. There is no tenderness. There is no rebound, no guarding and no CVA tenderness.  Musculoskeletal: Normal range of motion. He exhibits no edema or tenderness.  Lymphadenopathy:    He has no cervical adenopathy.  Neurological: He is alert and oriented to person, place, and time. He has normal sensation, normal strength and intact cranial nerves. No cranial nerve deficit.  Skin: Skin is warm. No rash noted.  Psychiatric: Mood  and affect normal.  Nursing note and vitals reviewed.     Assessment & Plan  Problem List Items Addressed This Visit      Cardiovascular and Mediastinum   Atherosclerosis of aorta (HCC)   Relevant Medications   clopidogrel (PLAVIX) 75 MG tablet   losartan (COZAAR) 50 MG tablet   simvastatin (ZOCOR) 80 MG tablet   Choline Fenofibrate (FENOFIBRIC ACID) 135 MG CPDR   Essential hypertension - Primary   Relevant Medications   losartan (COZAAR) 50 MG tablet   simvastatin (ZOCOR) 80 MG tablet   Choline Fenofibrate (FENOFIBRIC ACID) 135 MG CPDR   Other Relevant Orders   Renal Function Panel     Digestive   Esophageal reflux   Relevant Medications   dexlansoprazole (DEXILANT) 60 MG capsule     Endocrine   Adult hypothyroidism   Relevant Medications   levothyroxine (SYNTHROID, LEVOTHROID) 100 MCG tablet   Other Relevant Orders   TSH     Other  Depression   Relevant Medications   sertraline (ZOLOFT) 100 MG tablet   Chronic anxiety   Relevant Medications   clonazePAM (KLONOPIN) 0.5 MG tablet   sertraline (ZOLOFT) 100 MG tablet   Hyperlipemia   Relevant Medications   losartan (COZAAR) 50 MG tablet   simvastatin (ZOCOR) 80 MG tablet   Choline Fenofibrate (FENOFIBRIC ACID) 135 MG CPDR   Other Relevant Orders   Lipid Profile    Other Visit Diagnoses    Influenza vaccine needed       Relevant Orders   Flu vaccine HIGH DOSE PF (Completed)      Meds ordered this encounter  Medications  . levothyroxine (SYNTHROID, LEVOTHROID) 100 MCG tablet    Sig: Take 1 tablet (100 mcg total) by mouth daily before breakfast.    Dispense:  90 tablet    Refill:  1  . clopidogrel (PLAVIX) 75 MG tablet    Sig: Take 1 tablet (75 mg total) by mouth daily.    Dispense:  90 tablet    Refill:  1  . clonazePAM (KLONOPIN) 0.5 MG tablet    Sig: Take 1 tablet (0.5 mg total) by mouth daily. Take one half bid    Dispense:  90 tablet    Refill:  1  . sertraline (ZOLOFT) 100 MG tablet    Sig:  Take 1 tablet (100 mg total) by mouth daily.    Dispense:  90 tablet    Refill:  1  . losartan (COZAAR) 50 MG tablet    Sig: Take 1 tablet (50 mg total) by mouth daily.    Dispense:  90 tablet    Refill:  1  . dexlansoprazole (DEXILANT) 60 MG capsule    Sig: Take 1 capsule (60 mg total) by mouth daily.    Dispense:  90 capsule    Refill:  1  . simvastatin (ZOCOR) 80 MG tablet    Sig: Take 1 tablet (80 mg total) by mouth daily.    Dispense:  90 tablet    Refill:  1  . Choline Fenofibrate (FENOFIBRIC ACID) 135 MG CPDR    Sig: Take 1 capsule by mouth daily.    Dispense:  90 capsule    Refill:  1  . cetirizine (ZYRTEC) 10 MG tablet    Sig: Take 1 tablet (10 mg total) by mouth daily.    Dispense:  30 tablet    Refill:  11      Dr. Macon [facility]  01/29/17

## 2017-01-30 LAB — RENAL FUNCTION PANEL
Albumin: 4.3 g/dL (ref 3.6–4.8)
BUN / CREAT RATIO: 18 (ref 10–24)
BUN: 16 mg/dL (ref 8–27)
CHLORIDE: 101 mmol/L (ref 96–106)
CO2: 23 mmol/L (ref 20–29)
Calcium: 9.7 mg/dL (ref 8.6–10.2)
Creatinine, Ser: 0.88 mg/dL (ref 0.76–1.27)
GFR calc non Af Amer: 90 mL/min/{1.73_m2} (ref >59)
GFR, EST AFRICAN AMERICAN: 103 mL/min/{1.73_m2} (ref >59)
GLUCOSE: 92 mg/dL (ref 65–99)
POTASSIUM: 4.5 mmol/L (ref 3.5–5.2)
Phosphorus: 3.2 mg/dL (ref 2.5–4.5)
SODIUM: 140 mmol/L (ref 134–144)

## 2017-01-30 LAB — LIPID PANEL
CHOLESTEROL TOTAL: 132 mg/dL (ref 100–199)
Chol/HDL Ratio: 4.4 ratio (ref 0.0–5.0)
HDL: 30 mg/dL — ABNORMAL LOW (ref >39)
LDL Calculated: 79 mg/dL (ref 0–99)
TRIGLYCERIDES: 117 mg/dL (ref 0–149)
VLDL Cholesterol Cal: 23 mg/dL (ref 5–40)

## 2017-01-30 LAB — TSH: TSH: 3.21 u[IU]/mL (ref 0.450–4.500)

## 2017-02-04 ENCOUNTER — Other Ambulatory Visit: Payer: Self-pay | Admitting: Family Medicine

## 2017-02-04 DIAGNOSIS — E039 Hypothyroidism, unspecified: Secondary | ICD-10-CM

## 2017-07-21 ENCOUNTER — Ambulatory Visit: Payer: Medicare Other | Admitting: Family Medicine

## 2017-07-21 ENCOUNTER — Encounter: Payer: Self-pay | Admitting: Family Medicine

## 2017-07-21 VITALS — BP 130/80 | HR 88 | Ht 68.0 in | Wt 169.0 lb

## 2017-07-21 DIAGNOSIS — R079 Chest pain, unspecified: Secondary | ICD-10-CM

## 2017-07-21 DIAGNOSIS — R0789 Other chest pain: Secondary | ICD-10-CM

## 2017-07-21 DIAGNOSIS — K429 Umbilical hernia without obstruction or gangrene: Secondary | ICD-10-CM | POA: Diagnosis not present

## 2017-07-21 DIAGNOSIS — K219 Gastro-esophageal reflux disease without esophagitis: Secondary | ICD-10-CM | POA: Diagnosis not present

## 2017-07-21 DIAGNOSIS — K439 Ventral hernia without obstruction or gangrene: Secondary | ICD-10-CM | POA: Diagnosis not present

## 2017-07-21 NOTE — Progress Notes (Signed)
Name: Joel Flores   MRN: [id number]    DOB: 11-08-1950   Date:07/21/2017       Progress Note  Subjective  Chief Complaint  Chief Complaint  Patient presents with  . Gastroesophageal Reflux    had a pain in mid epigastric region yesterday- Rolaids helped. Is taking Dexilant daily  . Hernia    it isn't hurting him, but it is "bulging"     Hernia umbillical/laproscopic surgical area/present before surgery/ no change insize/ no pain during day  Gastroesophageal Reflux  He reports no abdominal pain, no belching, no chest pain, no choking, no coughing, no dysphagia, no early satiety, no globus sensation, no heartburn, no hoarse voice, no nausea, no sore throat, no stridor, no tooth decay, no water brash or no wheezing. This is a new problem. The current episode started yesterday. The problem occurs frequently. The problem has been waxing and waning. Exacerbated by: "I can eat anything. Pertinent negatives include no anemia, fatigue, melena, muscle weakness, orthopnea or weight loss. He has tried a PPI for the symptoms. The treatment provided moderate relief.  Abdominal Pain  This is a new problem. The current episode started yesterday. The onset quality is sudden. The problem occurs daily (several episodes). The pain is at a severity of 3/10. The pain is mild. Quality: pressure. The abdominal pain does not radiate. Pertinent negatives include no belching, constipation, diarrhea, dysuria, fever, frequency, headaches, hematuria, melena, myalgias, nausea, vomiting or weight loss. The pain is aggravated by movement (touching over epigastric). He has tried proton pump inhibitors for the symptoms. The treatment provided moderate relief. His past medical history is significant for GERD.  Chest Pain   This is a new problem. The current episode started yesterday. The onset quality is gradual. The problem occurs intermittently (every hour yesterday). The problem has been waxing and waning. The pain is present in  the epigastric region. The pain is at a severity of 3/10. The pain is moderate. The pain does not radiate. Pertinent negatives include no abdominal pain, back pain, claudication, cough, diaphoresis, dizziness, exertional chest pressure, fever, headaches, hemoptysis, irregular heartbeat, leg pain, lower extremity edema, malaise/fatigue, nausea, near-syncope, orthopnea, palpitations, PND, shortness of breath, sputum production, syncope or vomiting.  Pertinent negatives for past medical history include no muscle weakness.    No problem-specific Assessment & Plan notes found for this encounter.   Past Medical History:  Diagnosis Date  . Anxiety   . Depression   . GERD (gastroesophageal reflux disease)   . Hyperlipidemia   . Hypertension   . Thyroid disease   . Venous insufficiency     Past Surgical History:  Procedure Laterality Date  . COLONOSCOPY  2015   Dr Allen Norris- cleared for 5 years   . VEIN SURGERY     2 stents in R) leg    Family History  Problem Relation Age of Onset  . Diabetes Mother   . Cancer Father   . Heart disease Father   . Stroke Father     Social History   Socioeconomic History  . Marital status: Married    Spouse name: Not on file  . Number of children: Not on file  . Years of education: Not on file  . Highest education level: Not on file  Occupational History  . Not on file  Social Needs  . Financial resource strain: Not on file  . Food insecurity:    Worry: Not on file    Inability: Not on file  .  Transportation needs:    Medical: Not on file    Non-medical: Not on file  Tobacco Use  . Smoking status: Current Every Day Smoker  . Smokeless tobacco: Never Used  Substance and Sexual Activity  . Alcohol use: Yes    Alcohol/week: 0.0 oz  . Drug use: No  . Sexual activity: Never  Lifestyle  . Physical activity:    Days per week: Not on file    Minutes per session: Not on file  . Stress: Not on file  Relationships  . Social connections:     Talks on phone: Not on file    Gets together: Not on file    Attends religious service: Not on file    Active member of club or organization: Not on file    Attends meetings of clubs or organizations: Not on file    Relationship status: Not on file  . Intimate partner violence:    Fear of current or ex partner: Not on file    Emotionally abused: Not on file    Physically abused: Not on file    Forced sexual activity: Not on file  Other Topics Concern  . Not on file  Social History Narrative  . Not on file    Allergies  Allergen Reactions  . Sulfa Antibiotics Rash    Outpatient Medications Prior to Visit  Medication Sig Dispense Refill  . cetirizine (ZYRTEC) 10 MG tablet Take 1 tablet (10 mg total) by mouth daily. 30 tablet 11  . Choline Fenofibrate (FENOFIBRIC ACID) 135 MG CPDR Take 1 capsule by mouth daily. 90 capsule 1  . clonazePAM (KLONOPIN) 0.5 MG tablet Take 1 tablet (0.5 mg total) by mouth daily. Take one half bid (Patient taking differently: Take 0.25 mg by mouth daily. ) 90 tablet 1  . clopidogrel (PLAVIX) 75 MG tablet Take 1 tablet (75 mg total) by mouth daily. 90 tablet 1  . dexlansoprazole (DEXILANT) 60 MG capsule Take 1 capsule (60 mg total) by mouth daily. 90 capsule 1  . levothyroxine (SYNTHROID, LEVOTHROID) 100 MCG tablet Take 1 tablet (100 mcg total) by mouth daily before breakfast. 90 tablet 1  . losartan (COZAAR) 50 MG tablet Take 1 tablet (50 mg total) by mouth daily. 90 tablet 1  . sertraline (ZOLOFT) 100 MG tablet Take 1 tablet (100 mg total) by mouth daily. 90 tablet 1  . simvastatin (ZOCOR) 80 MG tablet Take 1 tablet (80 mg total) by mouth daily. 90 tablet 1  . levothyroxine (SYNTHROID, LEVOTHROID) 100 MCG tablet TAKE 1 TABLET (100 MCG TOTAL) BY MOUTH DAILY BEFORE BREAKFAST. 90 tablet 1   No facility-administered medications prior to visit.     Review of Systems  Constitutional: Negative for chills, diaphoresis, fatigue, fever, malaise/fatigue and weight  loss.  HENT: Negative for ear discharge, ear pain, hoarse voice and sore throat.   Eyes: Negative for blurred vision.  Respiratory: Negative for cough, hemoptysis, sputum production, choking, shortness of breath and wheezing.   Cardiovascular: Negative for chest pain, palpitations, orthopnea, claudication, leg swelling, syncope, PND and near-syncope.  Gastrointestinal: Negative for abdominal pain, blood in stool, constipation, diarrhea, dysphagia, heartburn, melena, nausea and vomiting.  Genitourinary: Negative for dysuria, frequency, hematuria and urgency.  Musculoskeletal: Negative for back pain, joint pain, myalgias, muscle weakness and neck pain.  Skin: Negative for rash.  Neurological: Negative for dizziness, tingling, sensory change, focal weakness and headaches.  Endo/Heme/Allergies: Negative for environmental allergies and polydipsia. Does not bruise/bleed easily.  Psychiatric/Behavioral: Negative for depression  and suicidal ideas. The patient is not nervous/anxious and does not have insomnia.      Objective  Vitals:   07/21/17 1045  BP: 130/80  Pulse: 88  Weight: 169 lb (76.7 kg)  Height: 5\' 8"  (1.727 m)    Physical Exam  Constitutional: He is oriented to person, place, and time and well-developed, well-nourished, and in no distress.  HENT:  Head: Normocephalic.  Right Ear: External ear normal.  Left Ear: External ear normal.  Nose: Nose normal.  Mouth/Throat: Oropharynx is clear and moist.  Eyes: Pupils are equal, round, and reactive to light. Conjunctivae and EOM are normal. Right eye exhibits no discharge. Left eye exhibits no discharge. No scleral icterus.  Neck: Normal range of motion. Neck supple. No JVD present. No tracheal deviation present. No thyromegaly present.  Cardiovascular: Normal rate, regular rhythm, normal heart sounds and intact distal pulses. Exam reveals no gallop and no friction rub.  No murmur heard. Pulmonary/Chest: Breath sounds normal. No  respiratory distress. He has no wheezes. He has no rales.  Abdominal: Soft. Bowel sounds are normal. He exhibits no mass. There is no hepatosplenomegaly. There is no tenderness. There is no rebound, no guarding and no CVA tenderness.  Musculoskeletal: Normal range of motion. He exhibits no edema or tenderness.  Lymphadenopathy:    He has no cervical adenopathy.  Neurological: He is alert and oriented to person, place, and time. He has normal sensation, normal strength, normal reflexes and intact cranial nerves. No cranial nerve deficit.  Skin: Skin is warm. No rash noted.  Psychiatric: Mood and affect normal.  Nursing note and vitals reviewed.     Assessment & Plan  Problem List Items Addressed This Visit      Digestive   Esophageal reflux    Other Visit Diagnoses    Chest pain, unspecified type    -  Primary   Relevant Orders   EKG 12-Lead (Completed)   Xiphoid pain       advil   Umbilical hernia without obstruction and without gangrene       Relevant Orders   Ambulatory referral to General Surgery   Ventral hernia without obstruction or gangrene       Relevant Orders   Ambulatory referral to General Surgery      No orders of the defined types were placed in this encounter.     Dr. [facility]  07/21/17

## 2017-07-21 NOTE — Patient Instructions (Signed)
Costochondritis Costochondritis is swelling and irritation (inflammation) of the tissue (cartilage) that connects your ribs to your breastbone (sternum). This causes pain in the front of your chest. The pain usually starts gradually and involves more than one rib. What are the causes? The exact cause of this condition is not always known. It results from stress on the cartilage where your ribs attach to your sternum. The cause of this stress could be:  Chest injury (trauma).  Exercise or activity, such as lifting.  Severe coughing.  What increases the risk? You may be at higher risk for this condition if you:  Are male.  Are 30?67 years old.  Recently started a new exercise or work activity.  Have low levels of vitamin D.  Have a condition that makes you cough frequently.  What are the signs or symptoms? The main symptom of this condition is chest pain. The pain:  Usually starts gradually and can be sharp or dull.  Gets worse with deep breathing, coughing, or exercise.  Gets better with rest.  May be worse when you press on the sternum-rib connection (tenderness).  How is this diagnosed? This condition is diagnosed based on your symptoms, medical history, and a physical exam. Your health care provider will check for tenderness when pressing on your sternum. This is the most important finding. You may also have tests to rule out other causes of chest pain. These may include:  A chest X-ray to check for lung problems.  An electrocardiogram (ECG) to see if you have a heart problem that could be causing the pain.  An imaging scan to rule out a chest or rib fracture.  How is this treated? This condition usually goes away on its own over time. Your health care provider may prescribe an NSAID to reduce pain and inflammation. Your health care provider may also suggest that you:  Rest and avoid activities that make pain worse.  Apply heat or cold to the area to reduce pain  and inflammation.  Do exercises to stretch your chest muscles.  If these treatments do not help, your health care provider may inject a numbing medicine at the sternum-rib connection to help relieve the pain. Follow these instructions at home:  Avoid activities that make pain worse. This includes any activities that use chest, abdominal, and side muscles.  If directed, put ice on the painful area: ? Put ice in a plastic bag. ? Place a towel between your skin and the bag. ? Leave the ice on for 20 minutes, 2-3 times a day.  If directed, apply heat to the affected area as often as told by your health care provider. Use the heat source that your health care provider recommends, such as a moist heat pack or a heating pad. ? Place a towel between your skin and the heat source. ? Leave the heat on for 20-30 minutes. ? Remove the heat if your skin turns bright red. This is especially important if you are unable to feel pain, heat, or cold. You may have a greater risk of getting burned.  Take over-the-counter and prescription medicines only as told by your health care provider.  Return to your normal activities as told by your health care provider. Ask your health care provider what activities are safe for you.  Keep all follow-up visits as told by your health care provider. This is important. Contact a health care provider if:  You have chills or a fever.  Your pain does not go   away or it gets worse.  You have a cough that does not go away (is persistent). Get help right away if:  You have shortness of breath. This information is not intended to replace advice given to you by your health care provider. Make sure you discuss any questions you have with your health care provider. Document Released: 01/21/2005 Document Revised: 11/01/2015 Document Reviewed: 08/07/2015 Elsevier Interactive Patient Education  2018 [employer]   

## 2017-07-22 ENCOUNTER — Other Ambulatory Visit: Payer: Self-pay

## 2017-07-26 ENCOUNTER — Encounter: Payer: Self-pay | Admitting: Surgery

## 2017-07-26 ENCOUNTER — Ambulatory Visit: Payer: Medicare Other | Admitting: Surgery

## 2017-07-26 ENCOUNTER — Telehealth: Payer: Self-pay

## 2017-07-26 VITALS — BP 130/81 | HR 84 | Temp 97.8°F | Ht 68.0 in | Wt 171.8 lb

## 2017-07-26 DIAGNOSIS — K429 Umbilical hernia without obstruction or gangrene: Secondary | ICD-10-CM | POA: Diagnosis not present

## 2017-07-26 NOTE — Progress Notes (Signed)
Surgical Clinic History and Physical  Referring provider:  Juline Patch, MD [address] [place], [place] [address]  HISTORY OF PRESENT ILLNESS (HPI):  67 y.o. male presents for evaluation of epigastric and umbilical "bulges". Patient reports he first noticed a small asymptomatic umbilical hernia 6 months prior to having underwent laparoscopic cholecystectomy at [facility] (10/2016), at which time patient says he was told the surgeon placed the camera through his umbilical hernia, but was not able to be repair the hernia at time of cholecystectomy. Since then, he says the hernia has become larger and more symptomatic. He also says he's noticed a bulge along his mid-upper abdomen. The this latter does not cause him pain, he reports it causes him anxiety. Patient otherwise denies constipation, frequent cough (no hemoptysis), obstructive symptoms, or urinary straining.  Of note, patient still smokes, but he reports that he is able to ascend/descend a flight of steps without stopping for CP or SOB. He, however, does not walk far, due to Right hip pain without Right calf pain with/without walking, now 10 years ago s/p placement of Right iliac artery stent for which he has more recently been seeing Dr. Lucky Cowboy for follow-up (not who placed stent). He has also since placement of Right iliac artery stent been taking once daily Plavix without aspirin. ABI's recently checked by Dr. Lucky Cowboy were WNL, and patient is scheduled for annual follow-up with Dr. Lucky Cowboy this coming July.  PAST MEDICAL HISTORY (PMH):  Past Medical History:  Diagnosis Date  . Anxiety   . Atherosclerosis of aorta ([place]) 02/12/2015  . Depression   . Essential hypertension 02/12/2015  . GERD (gastroesophageal reflux disease)   . Hyperlipidemia   . Hypertension   . PVD (peripheral vascular disease) ([place]) 11/17/2016  . Thyroid disease   . Venous insufficiency      PAST SURGICAL HISTORY [facility]):  Past Surgical History:  Procedure  Laterality Date  . CHOLECYSTECTOMY    . COLONOSCOPY  2015   Dr Allen Norris- cleared for 5 years   . VEIN SURGERY     2 stents in R) leg     MEDICATIONS:  Prior to Admission medications   Medication Sig Start Date End Date Taking? Authorizing Provider  cetirizine (ZYRTEC) 10 MG tablet Take 1 tablet (10 mg total) by mouth daily. 01/29/17  Yes Juline Patch, MD  Choline Fenofibrate (FENOFIBRIC ACID) 135 MG CPDR Take 1 capsule by mouth daily. 01/29/17  Yes Juline Patch, MD  clonazePAM (KLONOPIN) 0.5 MG tablet Take 1 tablet (0.5 mg total) by mouth daily. Take one half bid Patient taking differently: Take 0.25 mg by mouth daily.  01/29/17  Yes Juline Patch, MD  clopidogrel (PLAVIX) 75 MG tablet Take 1 tablet (75 mg total) by mouth daily. 01/29/17  Yes Juline Patch, MD  dexlansoprazole (DEXILANT) 60 MG capsule Take 1 capsule (60 mg total) by mouth daily. 01/29/17  Yes Juline Patch, MD  levothyroxine (SYNTHROID, LEVOTHROID) 100 MCG tablet Take 1 tablet (100 mcg total) by mouth daily before breakfast. 01/29/17  Yes Juline Patch, MD  losartan (COZAAR) 50 MG tablet Take 1 tablet (50 mg total) by mouth daily. 01/29/17  Yes Juline Patch, MD  sertraline (ZOLOFT) 100 MG tablet Take 1 tablet (100 mg total) by mouth daily. 01/29/17  Yes Juline Patch, MD  simvastatin (ZOCOR) 80 MG tablet Take 1 tablet (80 mg total) by mouth daily. 01/29/17  Yes Juline Patch, MD  ALLERGIES:  Allergies  Allergen Reactions  . Sulfa Antibiotics Rash     SOCIAL HISTORY:  Social History   Socioeconomic History  . Marital status: Married    Spouse name: Not on file  . Number of children: Not on file  . Years of education: Not on file  . Highest education level: Not on file  Occupational History  . Not on file  Social Needs  . Financial resource strain: Not on file  . Food insecurity:    Worry: Not on file    Inability: Not on file  . Transportation needs:    Medical: Not on file    Non-medical: Not  on file  Tobacco Use  . Smoking status: Current Every Day Smoker  . Smokeless tobacco: Never Used  Substance and Sexual Activity  . Alcohol use: Yes    Alcohol/week: 0.0 oz  . Drug use: No  . Sexual activity: Never  Lifestyle  . Physical activity:    Days per week: Not on file    Minutes per session: Not on file  . Stress: Not on file  Relationships  . Social connections:    Talks on phone: Not on file    Gets together: Not on file    Attends religious service: Not on file    Active member of club or organization: Not on file    Attends meetings of clubs or organizations: Not on file    Relationship status: Not on file  . Intimate partner violence:    Fear of current or ex partner: Not on file    Emotionally abused: Not on file    Physically abused: Not on file    Forced sexual activity: Not on file  Other Topics Concern  . Not on file  Social History Narrative  . Not on file    The patient currently resides (home / rehab facility / nursing home): Home The patient normally is (ambulatory / bedbound): Ambulatory  FAMILY HISTORY:  Family History  Problem Relation Age of Onset  . Diabetes Mother   . Cancer Father   . Heart disease Father   . Stroke Father     Otherwise negative/non-contributory.  REVIEW OF SYSTEMS:  Constitutional: denies any other weight loss, fever, chills, or sweats  Eyes: denies any other vision changes, history of eye injury  ENT: denies sore throat, hearing problems  Respiratory: denies shortness of breath, wheezing  Cardiovascular: denies chest pain, palpitations  Gastrointestinal: abdominal pain, N/V, and bowel function as per HPI Musculoskeletal: denies any other joint pains or cramps  Skin: Denies any other rashes or skin discolorations Neurological: denies any other headache, dizziness, weakness  Psychiatric: Denies any other depression, anxiety   All other review of systems were otherwise negative   VITAL SIGNS:  BP 130/81   Pulse  84   Temp 97.8 F (36.6 C) (Oral)   Ht 5\' 8"  (1.727 m)   Wt 171 lb 12.8 oz (77.9 kg)   BMI 26.12 kg/m   PHYSICAL EXAM:  Constitutional:  -- Normal body habitus  -- Awake, alert, and oriented x3  Eyes:  -- Pupils equally round and reactive to light  -- No scleral icterus  Ear, nose, throat:  -- No jugular venous distension -- No nasal drainage, bleeding Pulmonary:  -- No crackles  -- Equal breath sounds bilaterally -- Breathing non-labored at rest Cardiovascular:  -- S1, S2 present  -- No pericardial rubs  Gastrointestinal:  -- Abdomen soft, nontender, non-distended, no guarding/rebound --  Easily reducible and mildly tender to palpation umbilical hernia -- No abdominal masses appreciated, pulsatile or otherwise  Musculoskeletal and Integumentary:  -- Wounds or skin discoloration: None appreciated except well-healed lap chole port site incisions -- Extremities: B/L UE and LE FROM, hands and feet warm, no edema  Neurologic:  -- Motor function: Intact and symmetric -- Sensation: Intact and symmetric  Labs:  CBC Latest Ref Rng & Units 11/02/2016 10/30/2016  WBC 3.8 - 10.6 K/uL 14.0(H) 18.2(H)  Hemoglobin 13.0 - 18.0 g/dL 13.3 14.4  Hematocrit 40.0 - 52.0 % 38.6(L) 42.0  Platelets 150 - 440 K/uL 326 349   CMP Latest Ref Rng & Units 01/29/2017 11/02/2016 10/30/2016  Glucose 65 - 99 mg/dL 92 - 130(H)  BUN 8 - 27 mg/dL 16 - 20  Creatinine 0.76 - 1.27 mg/dL 0.88 - 1.04  Sodium 134 - 144 mmol/L 140 - 137  Potassium 3.5 - 5.2 mmol/L 4.5 - 3.7  Chloride 96 - 106 mmol/L 101 - 104  CO2 20 - 29 mmol/L 23 - 25  Calcium 8.6 - 10.2 mg/dL 9.7 - 9.7  Total Protein 6.5 - 8.1 g/dL - 7.0 7.6  Total Bilirubin 0.3 - 1.2 mg/dL - 0.8 0.8  Alkaline Phos 38 - 126 U/L - 54 47  AST 15 - 41 U/L - 53(H) 31  ALT 17 - 63 U/L - 32 19    Imaging studies: No new pertinent imaging studies available for review   Assessment/Plan:  67 y.o. male with an increasingly symptomatic anxiety-provoking reducible  umbilical hernia, complicated by chronic single-agent Plavix antiplatelet therapy 10 years s/p placement of Right iliac arterial disease for what sounds like lifestyle-limiting claudication and by co-morbidities including HTN, HLD, PAD, chronic ongoing tobacco (smoking) abuse, GERD, thyroid disease (not otherwise specified), generalized anxiety disorder, and major depression disorder.   - signs/symptoms of hernia incarceration and bowel obstruction discussed  - minimize heavy lifting and strenuous activities, abdominal binder for symptomatic relief  - all risks, benefits, and alternatives to elective repair of umbilical hernia with mesh were discussed with the patient, specifically including but not limited to placement of mesh, all of his questions were answered to his expressed satisfaction, patient expresses he wishes to proceed, and informed consent was obtained.  - considering patient's history of PAD with ongoing tobacco abuse, will require medical risk stratification and optimization prior to scheduling elective surgery  - considering Right iliac arterial stent placement 10 years ago, it's unclear why patient remains on Plavix and has not been prescribed low-dose aspirin for overall cardiovascular risk reduction  - will need to confirm with Dr. Lucky Cowboy that it is okay (as would be expected) to hold Plavix for surgery (though would feel comfortable in continuing low dose aspirin throughout patient's peri-operative period)  - long-term and peri-operative risks of ongoing tobacco abuse reviewed with patient, and smoking cessation was strongly encouraged  - patient has said he will provide list of dates on which his wife is available to drive to/from surgery  - will schedule for elective open repair of umbilical hernia with mesh pending the above   - anticipate return to clinic 2 weeks following above procedure  - instructed to call if any questions or concerns  All of the above recommendations were  discussed with the patient, and all of patient's questions were answered to his expressed satisfaction.  Thank you for the opportunity to participate in this patient's care.  -- Marilynne Drivers Rosana Hoes, MD, [place]: [facility]  General Surgery - Partnering for exceptional care. Office: [phone]

## 2017-07-26 NOTE — Telephone Encounter (Signed)
Medical Clearance faxed to Dr.Deanna Jones at this time.  Anti-coagulant clearance faxed to Dr.Dew at this time.

## 2017-07-26 NOTE — Patient Instructions (Addendum)
We will get medical and cardiac clearance prior to scheduling your surgery.  We will call you once we obtain it so we could schedule your surgery.  Open Hernia Repair, Adult, Care After These instructions give you information about caring for yourself after your procedure. Your doctor may also give you more specific instructions. If you have problems or questions, contact your doctor. Follow these instructions at home: Surgical cut (incision) care   Follow instructions from your doctor about how to take care of your surgical cut area. Make sure you: ? Wash your hands with soap and water before you change your bandage (dressing). If you cannot use soap and water, use hand sanitizer. ? Change your bandage as told by your doctor. ? Leave stitches (sutures), skin glue, or skin tape (adhesive) strips in place. They may need to stay in place for 2 weeks or longer. If tape strips get loose and curl up, you may trim the loose edges. Do not remove tape strips completely unless your doctor says it is okay.  Check your surgical cut every day for signs of infection. Check for: ? More redness, swelling, or pain. ? More fluid or blood. ? Warmth. ? Pus or a bad smell. Activity  Do not drive or use heavy machinery while taking prescription pain medicine. Do not drive until your doctor says it is okay.  Until your doctor says it is okay: ? Do not lift anything that is heavier than 10 lb (4.5 kg). ? Do not play contact sports.  Return to your normal activities as told by your doctor. Ask your doctor what activities are safe. General instructions  To prevent or treat having a hard time pooping (constipation) while you are taking prescription pain medicine, your doctor may recommend that you: ? Drink enough fluid to keep your pee (urine) clear or pale yellow. ? Take over-the-counter or prescription medicines. ? Eat foods that are high in fiber, such as fresh fruits and vegetables, whole grains, and  beans. ? Limit foods that are high in fat and processed sugars, such as fried and sweet foods.  Take over-the-counter and prescription medicines only as told by your doctor.  Do not take baths, swim, or use a hot tub until your doctor says it is okay.  Keep all follow-up visits as told by your doctor. This is important. Contact a doctor if:  You develop a rash.  You have more redness, swelling, or pain around your surgical cut.  You have more fluid or blood coming from your surgical cut.  Your surgical cut feels warm to the touch.  You have pus or a bad smell coming from your surgical cut.  You have a fever or chills.  You have blood in your poop (stool).  You have not pooped in 2-3 days.  Medicine does not help your pain. Get help right away if:  You have chest pain or you are short of breath.  You feel light-headed.  You feel weak and dizzy (feel faint).  You have very bad pain.  You throw up (vomit) and your pain is worse. This information is not intended to replace advice given to you by your health care provider. Make sure you discuss any questions you have with your health care provider. Document Released: 05/04/2014 Document Revised: 11/01/2015 Document Reviewed: 09/25/2015 Elsevier Interactive Patient Education  [employer].

## 2017-07-27 ENCOUNTER — Encounter: Payer: Self-pay | Admitting: Family Medicine

## 2017-07-27 ENCOUNTER — Telehealth: Payer: Self-pay

## 2017-07-27 ENCOUNTER — Ambulatory Visit: Payer: Medicare Other | Admitting: Family Medicine

## 2017-07-27 VITALS — BP 120/82 | HR 88 | Ht 68.0 in | Wt 172.0 lb

## 2017-07-27 DIAGNOSIS — Z1159 Encounter for screening for other viral diseases: Secondary | ICD-10-CM | POA: Diagnosis not present

## 2017-07-27 DIAGNOSIS — Z01818 Encounter for other preprocedural examination: Secondary | ICD-10-CM

## 2017-07-27 DIAGNOSIS — E782 Mixed hyperlipidemia: Secondary | ICD-10-CM

## 2017-07-27 DIAGNOSIS — I10 Essential (primary) hypertension: Secondary | ICD-10-CM

## 2017-07-27 DIAGNOSIS — I700 Atherosclerosis of aorta: Secondary | ICD-10-CM | POA: Diagnosis not present

## 2017-07-27 DIAGNOSIS — R351 Nocturia: Secondary | ICD-10-CM

## 2017-07-27 DIAGNOSIS — F3341 Major depressive disorder, recurrent, in partial remission: Secondary | ICD-10-CM | POA: Diagnosis not present

## 2017-07-27 DIAGNOSIS — K219 Gastro-esophageal reflux disease without esophagitis: Secondary | ICD-10-CM

## 2017-07-27 DIAGNOSIS — F419 Anxiety disorder, unspecified: Secondary | ICD-10-CM | POA: Diagnosis not present

## 2017-07-27 DIAGNOSIS — E039 Hypothyroidism, unspecified: Secondary | ICD-10-CM

## 2017-07-27 MED ORDER — FENOFIBRIC ACID 135 MG PO CPDR: 1.0000 | DELAYED_RELEASE_CAPSULE | Freq: Every day | ORAL | 1 refills | Status: DC

## 2017-07-27 MED ORDER — SIMVASTATIN 80 MG PO TABS: 80.0000 mg | ORAL_TABLET | Freq: Every day | ORAL | 1 refills | Status: DC

## 2017-07-27 MED ORDER — LOSARTAN POTASSIUM 50 MG PO TABS: 50.0000 mg | ORAL_TABLET | Freq: Every day | ORAL | 1 refills | Status: DC

## 2017-07-27 MED ORDER — LEVOTHYROXINE SODIUM 100 MCG PO TABS: 100.0000 ug | ORAL_TABLET | Freq: Every day | ORAL | 1 refills | Status: DC

## 2017-07-27 MED ORDER — DEXLANSOPRAZOLE 60 MG PO CPDR: 1.0000 | DELAYED_RELEASE_CAPSULE | Freq: Every day | ORAL | 1 refills | Status: DC

## 2017-07-27 MED ORDER — CLONAZEPAM 0.5 MG PO TABS: 0.5000 mg | ORAL_TABLET | Freq: Every day | ORAL | 1 refills | Status: DC

## 2017-07-27 MED ORDER — SERTRALINE HCL 100 MG PO TABS: 100.0000 mg | ORAL_TABLET | Freq: Every day | ORAL | 1 refills | Status: DC

## 2017-07-27 MED ORDER — CLOPIDOGREL BISULFATE 75 MG PO TABS: 75.0000 mg | ORAL_TABLET | Freq: Every day | ORAL | 1 refills | Status: DC

## 2017-07-27 NOTE — Progress Notes (Signed)
Name: Joel Flores   MRN: [id number]    DOB: Sep 04, 1950   Date:07/27/2017       Progress Note  Subjective  Chief Complaint  Chief Complaint  Patient presents with  . Hyperlipidemia  . Hypertension  . Allergic Rhinitis   . Anxiety  . Gastroesophageal Reflux  . Depression  . Hypothyroidism    Hyperlipidemia  This is a chronic problem. The current episode started more than 1 year ago. The problem is controlled. Recent lipid tests were reviewed and are normal. He has no history of chronic renal disease, diabetes, hypothyroidism, liver disease, obesity or nephrotic syndrome. Factors aggravating his hyperlipidemia include thiazides. Pertinent negatives include no chest pain, focal sensory loss, focal weakness, leg pain, myalgias or shortness of breath. Current antihyperlipidemic treatment includes statins and fibric acid derivatives. The current treatment provides moderate improvement of lipids. There are no compliance problems.  Risk factors for coronary artery disease include dyslipidemia, hypertension and male sex.  Hypertension  This is a chronic problem. The current episode started more than 1 year ago. The problem is unchanged. The problem is controlled. Associated symptoms include anxiety. Pertinent negatives include no blurred vision, chest pain, headaches, malaise/fatigue, neck pain, orthopnea, palpitations, peripheral edema, PND, shortness of breath or sweats. There are no associated agents to hypertension. There are no known risk factors for coronary artery disease. Past treatments include angiotensin blockers. The current treatment provides moderate improvement. There are no compliance problems.  Hypertensive end-organ damage includes PVD. There is no history of angina, kidney disease, CAD/MI, CVA, heart failure, left ventricular hypertrophy or retinopathy. There is no history of chronic renal disease, a hypertension causing med, renovascular disease or a thyroid problem.  Anxiety  Presents  for follow-up visit. Symptoms include excessive worry, irritability and nervous/anxious behavior. Patient reports no chest pain, compulsions, confusion, decreased concentration, depressed mood, dizziness, dry mouth, feeling of choking, hyperventilation, impotence, insomnia, malaise, muscle tension, nausea, obsessions, palpitations, panic, restlessness, shortness of breath or suicidal ideas. Symptoms occur occasionally. The severity of symptoms is mild.   There is no history of depression.  Gastroesophageal Reflux  He reports no abdominal pain, no belching, no chest pain, no choking, no coughing, no dysphagia, no early satiety, no globus sensation, no heartburn, no hoarse voice, no nausea, no sore throat, no stridor, no tooth decay or no wheezing. This is a chronic problem. The current episode started more than 1 year ago. The problem has been waxing and waning. The symptoms are aggravated by certain foods. Pertinent negatives include no anemia, fatigue, melena, muscle weakness, orthopnea or weight loss. The treatment provided moderate relief.  Depression         The patient presents with no depression.  This is a chronic problem.  The current episode started more than 1 year ago.   The problem occurs intermittently.  The problem has been gradually improving since onset.  Associated symptoms include irritable.  Associated symptoms include no decreased concentration, no fatigue, no helplessness, no hopelessness, does not have insomnia, no restlessness, no decreased interest, no appetite change, no body aches, no myalgias, no headaches, no indigestion, not sad and no suicidal ideas.  Past treatments include SSRIs - Selective serotonin reuptake inhibitors.  Compliance with treatment is good.  Previous treatment provided moderate relief.  Past medical history includes anxiety.     Pertinent negatives include no fibromyalgia, no hypothyroidism, no thyroid problem and no depression. Thyroid Problem  Presents for  follow-up visit. Symptoms include anxiety. Patient reports no cold  intolerance, constipation, depressed mood, diaphoresis, diarrhea, dry skin, fatigue, hair loss, heat intolerance, hoarse voice, leg swelling, nail problem, palpitations, tremors, visual change, weight gain or weight loss. The symptoms have been stable. His past medical history is significant for hyperlipidemia. There is no history of diabetes or heart failure.    No problem-specific Assessment & Plan notes found for this encounter.   Past Medical History:  Diagnosis Date  . Anxiety   . Atherosclerosis of aorta ([place]) 02/12/2015  . Depression   . Essential hypertension 02/12/2015  . GERD (gastroesophageal reflux disease)   . Hyperlipidemia   . Hypertension   . PVD (peripheral vascular disease) ([place]) 11/17/2016  . Thyroid disease   . Venous insufficiency     Past Surgical History:  Procedure Laterality Date  . CHOLECYSTECTOMY    . COLONOSCOPY  2015   Dr Allen Norris- cleared for 5 years   . VEIN SURGERY     2 stents in R) leg    Family History  Problem Relation Age of Onset  . Diabetes Mother   . Cancer Father   . Heart disease Father   . Stroke Father     Social History   Socioeconomic History  . Marital status: Married    Spouse name: Not on file  . Number of children: Not on file  . Years of education: Not on file  . Highest education level: Not on file  Occupational History  . Not on file  Social Needs  . Financial resource strain: Not on file  . Food insecurity:    Worry: Not on file    Inability: Not on file  . Transportation needs:    Medical: Not on file    Non-medical: Not on file  Tobacco Use  . Smoking status: Current Every Day Smoker  . Smokeless tobacco: Never Used  Substance and Sexual Activity  . Alcohol use: Yes    Alcohol/week: 0.0 oz  . Drug use: No  . Sexual activity: Never  Lifestyle  . Physical activity:    Days per week: Not on file    Minutes per session: Not on file  .  Stress: Not on file  Relationships  . Social connections:    Talks on phone: Not on file    Gets together: Not on file    Attends religious service: Not on file    Active member of club or organization: Not on file    Attends meetings of clubs or organizations: Not on file    Relationship status: Not on file  . Intimate partner violence:    Fear of current or ex partner: Not on file    Emotionally abused: Not on file    Physically abused: Not on file    Forced sexual activity: Not on file  Other Topics Concern  . Not on file  Social History Narrative  . Not on file    Allergies  Allergen Reactions  . Sulfa Antibiotics Rash    Outpatient Medications Prior to Visit  Medication Sig Dispense Refill  . cetirizine (ZYRTEC) 10 MG tablet Take 1 tablet (10 mg total) by mouth daily. 30 tablet 11  . Choline Fenofibrate (FENOFIBRIC ACID) 135 MG CPDR Take 1 capsule by mouth daily. 90 capsule 1  . clonazePAM (KLONOPIN) 0.5 MG tablet Take 1 tablet (0.5 mg total) by mouth daily. Take one half bid (Patient taking differently: Take 0.25 mg by mouth daily. ) 90 tablet 1  . clopidogrel (PLAVIX) 75 MG tablet Take  1 tablet (75 mg total) by mouth daily. 90 tablet 1  . dexlansoprazole (DEXILANT) 60 MG capsule Take 1 capsule (60 mg total) by mouth daily. 90 capsule 1  . levothyroxine (SYNTHROID, LEVOTHROID) 100 MCG tablet Take 1 tablet (100 mcg total) by mouth daily before breakfast. 90 tablet 1  . losartan (COZAAR) 50 MG tablet Take 1 tablet (50 mg total) by mouth daily. 90 tablet 1  . sertraline (ZOLOFT) 100 MG tablet Take 1 tablet (100 mg total) by mouth daily. 90 tablet 1  . simvastatin (ZOCOR) 80 MG tablet Take 1 tablet (80 mg total) by mouth daily. 90 tablet 1   No facility-administered medications prior to visit.     Review of Systems  Constitutional: Positive for irritability. Negative for appetite change, chills, diaphoresis, fatigue, fever, malaise/fatigue, weight gain and weight loss.   HENT: Negative for ear discharge, ear pain, hoarse voice and sore throat.   Eyes: Negative for blurred vision.  Respiratory: Negative for cough, sputum production, choking, shortness of breath and wheezing.   Cardiovascular: Negative for chest pain, palpitations, orthopnea, leg swelling and PND.  Gastrointestinal: Negative for abdominal pain, blood in stool, constipation, diarrhea, dysphagia, heartburn, melena and nausea.  Genitourinary: Negative for dysuria, frequency, hematuria, impotence and urgency.  Musculoskeletal: Negative for back pain, joint pain, myalgias, muscle weakness and neck pain.  Skin: Negative for rash.  Neurological: Negative for dizziness, tingling, tremors, sensory change, focal weakness and headaches.  Endo/Heme/Allergies: Negative for environmental allergies, cold intolerance, heat intolerance and polydipsia. Does not bruise/bleed easily.  Psychiatric/Behavioral: Positive for depression. Negative for confusion, decreased concentration and suicidal ideas. The patient is nervous/anxious. The patient does not have insomnia.      Objective  Vitals:   07/27/17 0846  BP: 120/82  Pulse: 88  Weight: 172 lb (78 kg)  Height: 5\' 8"  (1.727 m)    Physical Exam  Constitutional: He is oriented to person, place, and time and well-developed, well-nourished, and in no distress. He is irritable.  HENT:  Head: Normocephalic.  Right Ear: External ear normal.  Left Ear: External ear normal.  Nose: Nose normal.  Mouth/Throat: Oropharynx is clear and moist.  Eyes: Pupils are equal, round, and reactive to light. Conjunctivae and EOM are normal. Right eye exhibits no discharge. Left eye exhibits no discharge. No scleral icterus.  Neck: Normal range of motion. Neck supple. No JVD present. No tracheal deviation present. No thyromegaly present.  Cardiovascular: Normal rate, regular rhythm, normal heart sounds and intact distal pulses. Exam reveals no gallop and no friction rub.  No  murmur heard. Pulmonary/Chest: Breath sounds normal. No respiratory distress. He has no wheezes. He has no rales. He exhibits no tenderness.  Abdominal: Soft. Bowel sounds are normal. He exhibits no mass. There is no hepatosplenomegaly. There is no tenderness. There is no rebound, no guarding and no CVA tenderness.  Genitourinary: Rectum normal and prostate normal. Rectal exam shows guaiac negative stool.  Musculoskeletal: Normal range of motion. He exhibits no edema or tenderness.  Lymphadenopathy:    He has no cervical adenopathy.  Neurological: He is alert and oriented to person, place, and time. He has normal sensation, normal strength, normal reflexes and intact cranial nerves. No cranial nerve deficit.  Skin: Skin is warm. No rash noted.  Psychiatric: Mood and affect normal.  Nursing note and vitals reviewed.     Assessment & Plan  Problem List Items Addressed This Visit      Cardiovascular and Mediastinum   Atherosclerosis of aorta (  [place])   Relevant Medications   clopidogrel (PLAVIX) 75 MG tablet   losartan (COZAAR) 50 MG tablet   simvastatin (ZOCOR) 80 MG tablet   Choline Fenofibrate (FENOFIBRIC ACID) 135 MG CPDR   Essential hypertension   Relevant Medications   losartan (COZAAR) 50 MG tablet   simvastatin (ZOCOR) 80 MG tablet   Choline Fenofibrate (FENOFIBRIC ACID) 135 MG CPDR   Other Relevant Orders   Comprehensive metabolic panel     Digestive   Esophageal reflux   Relevant Medications   dexlansoprazole (DEXILANT) 60 MG capsule     Endocrine   Adult hypothyroidism   Relevant Medications   levothyroxine (SYNTHROID, LEVOTHROID) 100 MCG tablet   Other Relevant Orders   TSH     Other   Depression   Relevant Medications   sertraline (ZOLOFT) 100 MG tablet   Chronic anxiety   Relevant Medications   clonazePAM (KLONOPIN) 0.5 MG tablet   sertraline (ZOLOFT) 100 MG tablet   Hyperlipemia   Relevant Medications   losartan (COZAAR) 50 MG tablet   simvastatin  (ZOCOR) 80 MG tablet   Choline Fenofibrate (FENOFIBRIC ACID) 135 MG CPDR   Other Relevant Orders   Lipid Panel With LDL/HDL Ratio   Nocturia   Relevant Orders   PSA    Other Visit Diagnoses    Need for hepatitis C screening test    -  Primary   Relevant Orders   Hepatitis C antibody   Preop examination       Relevant Orders   Comprehensive metabolic panel   CBC with Differential/Platelet      Meds ordered this encounter  Medications  . levothyroxine (SYNTHROID, LEVOTHROID) 100 MCG tablet    Sig: Take 1 tablet (100 mcg total) by mouth daily before breakfast.    Dispense:  90 tablet    Refill:  1  . clopidogrel (PLAVIX) 75 MG tablet    Sig: Take 1 tablet (75 mg total) by mouth daily.    Dispense:  90 tablet    Refill:  1  . clonazePAM (KLONOPIN) 0.5 MG tablet    Sig: Take 1 tablet (0.5 mg total) by mouth daily. Take one half bid    Dispense:  90 tablet    Refill:  1  . sertraline (ZOLOFT) 100 MG tablet    Sig: Take 1 tablet (100 mg total) by mouth daily.    Dispense:  90 tablet    Refill:  1  . losartan (COZAAR) 50 MG tablet    Sig: Take 1 tablet (50 mg total) by mouth daily.    Dispense:  90 tablet    Refill:  1  . dexlansoprazole (DEXILANT) 60 MG capsule    Sig: Take 1 capsule (60 mg total) by mouth daily.    Dispense:  90 capsule    Refill:  1  . simvastatin (ZOCOR) 80 MG tablet    Sig: Take 1 tablet (80 mg total) by mouth daily.    Dispense:  90 tablet    Refill:  1  . Choline Fenofibrate (FENOFIBRIC ACID) 135 MG CPDR    Sig: Take 1 capsule by mouth daily.    Dispense:  90 capsule    Refill:  1  The 10-year ASCVD risk score Mikey Bussing DC Jr., et al., 2013) is: 20.1%   Values used to calculate the score:     Age: 67 years     Sex: Male     Is Non-Hispanic African American: No  Diabetic: No     Tobacco smoker: Yes     Systolic Blood Pressure: 509 mmHg     Is BP treated: Yes     HDL Cholesterol: 30 mg/dL     Total Cholesterol: 132 mg/dL Patient has  clearance for upcoming cardiac history.  Joel Flores is a 67 y.o. male who presents today for his Complete Annual Exam. He feels well. He reports exercising . He reports he is sleeping well.  Immunizations are reviewed and recommendations provided.   Age appropriate screening tests are discussed. Counseling given for risk factor reduction interventions.Health risks of being over weight were discussed and patient was counseled on weight loss options and exercise.Patient has been advised of the health risks of smoking and counseled concerning cessation of tobacco products. I spent over 3 minutes for discussion and to answer questions.although risk elevated all measures areas that are treatable are stable. Dr. [facility]  07/27/17

## 2017-07-27 NOTE — Telephone Encounter (Signed)
No phone message needed

## 2017-07-28 ENCOUNTER — Telehealth: Payer: Self-pay | Admitting: Surgery

## 2017-07-28 ENCOUNTER — Other Ambulatory Visit: Payer: Self-pay

## 2017-07-28 DIAGNOSIS — Z01818 Encounter for other preprocedural examination: Secondary | ICD-10-CM

## 2017-07-28 LAB — CBC WITH DIFFERENTIAL/PLATELET
BASOS ABS: 0.1 10*3/uL (ref 0.0–0.2)
Basos: 1 %
EOS (ABSOLUTE): 0.4 10*3/uL (ref 0.0–0.4)
Eos: 5 %
HEMOGLOBIN: 13.7 g/dL (ref 13.0–17.7)
Hematocrit: 41.1 % (ref 37.5–51.0)
Immature Grans (Abs): 0 10*3/uL (ref 0.0–0.1)
Immature Granulocytes: 0 %
LYMPHS ABS: 2.1 10*3/uL (ref 0.7–3.1)
Lymphs: 25 %
MCH: 30 pg (ref 26.6–33.0)
MCHC: 33.3 g/dL (ref 31.5–35.7)
MCV: 90 fL (ref 79–97)
Monocytes Absolute: 0.9 10*3/uL (ref 0.1–0.9)
Monocytes: 11 %
NEUTROS ABS: 4.8 10*3/uL (ref 1.4–7.0)
Neutrophils: 58 %
PLATELETS: 413 10*3/uL — AB (ref 150–379)
RBC: 4.57 x10E6/uL (ref 4.14–5.80)
RDW: 14.6 % (ref 12.3–15.4)
WBC: 8.3 10*3/uL (ref 3.4–10.8)

## 2017-07-28 LAB — HEPATITIS C ANTIBODY

## 2017-07-28 LAB — COMPREHENSIVE METABOLIC PANEL
A/G RATIO: 1.4 (ref 1.2–2.2)
ALBUMIN: 4.1 g/dL (ref 3.6–4.8)
ALT: 13 IU/L (ref 0–44)
AST: 28 IU/L (ref 0–40)
Alkaline Phosphatase: 62 IU/L (ref 39–117)
BUN / CREAT RATIO: 17 (ref 10–24)
BUN: 16 mg/dL (ref 8–27)
Bilirubin Total: 0.3 mg/dL (ref 0.0–1.2)
CHLORIDE: 104 mmol/L (ref 96–106)
CO2: 19 mmol/L — ABNORMAL LOW (ref 20–29)
Calcium: 9.7 mg/dL (ref 8.6–10.2)
Creatinine, Ser: 0.93 mg/dL (ref 0.76–1.27)
GFR calc non Af Amer: 85 mL/min/{1.73_m2} (ref >59)
GFR, EST AFRICAN AMERICAN: 98 mL/min/{1.73_m2} (ref >59)
GLOBULIN, TOTAL: 3 g/dL (ref 1.5–4.5)
Glucose: 92 mg/dL (ref 65–99)
POTASSIUM: 4.9 mmol/L (ref 3.5–5.2)
Sodium: 142 mmol/L (ref 134–144)
TOTAL PROTEIN: 7.1 g/dL (ref 6.0–8.5)

## 2017-07-28 LAB — LIPID PANEL WITH LDL/HDL RATIO
CHOLESTEROL TOTAL: 133 mg/dL (ref 100–199)
HDL: 34 mg/dL — ABNORMAL LOW (ref >39)
LDL Calculated: 81 mg/dL (ref 0–99)
LDl/HDL Ratio: 2.4 ratio (ref 0.0–3.6)
Triglycerides: 91 mg/dL (ref 0–149)
VLDL Cholesterol Cal: 18 mg/dL (ref 5–40)

## 2017-07-28 LAB — TSH: TSH: 3 u[IU]/mL (ref 0.450–4.500)

## 2017-07-28 LAB — PSA: PROSTATE SPECIFIC AG, SERUM: 0.6 ng/mL (ref 0.0–4.0)

## 2017-07-28 NOTE — Telephone Encounter (Signed)
We have received patients medical clearance, medical clearance has been placed in patients chart. °

## 2017-07-30 DIAGNOSIS — R0602 Shortness of breath: Secondary | ICD-10-CM | POA: Insufficient documentation

## 2017-08-11 ENCOUNTER — Telehealth: Payer: Self-pay | Admitting: Surgery

## 2017-08-11 NOTE — Telephone Encounter (Signed)
Pt advised of pre op date/time and sx date. Sx: 08/31/17 with Dr Marcy Siren umbilical hernia repair with  Mesh.  Pre op: 08/24/17 at 10:00pm--office interview.   Patient made aware to call [phone], between 1-3:00pm the day before surgery, to find out what time to arrive.

## 2017-08-12 ENCOUNTER — Inpatient Hospital Stay: Admission: RE | Admit: 2017-08-12 | Payer: Medicare Other | Source: Ambulatory Visit

## 2017-08-24 ENCOUNTER — Telehealth: Payer: Self-pay

## 2017-08-24 ENCOUNTER — Other Ambulatory Visit: Payer: Self-pay

## 2017-08-24 ENCOUNTER — Encounter
Admission: RE | Admit: 2017-08-24 | Discharge: 2017-08-24 | Disposition: A | Discharge status: Home / Self Care | Payer: Medicare Other | Source: Ambulatory Visit | Attending: Surgery | Admitting: Surgery

## 2017-08-24 DIAGNOSIS — Z01818 Encounter for other preprocedural examination: Secondary | ICD-10-CM | POA: Diagnosis not present

## 2017-08-24 HISTORY — DX: Hypothyroidism, unspecified: E03.9

## 2017-08-24 HISTORY — DX: Other allergic rhinitis: J30.89

## 2017-08-24 HISTORY — DX: Cough, unspecified: R05.9

## 2017-08-24 HISTORY — DX: Cough: R05

## 2017-08-24 MED ORDER — FLUTICASONE PROPIONATE 50 MCG/ACT NA SUSP: 2.0000 | Freq: Every day | NASAL | 6 refills | Status: DC

## 2017-08-24 MED ORDER — CHLORHEXIDINE GLUCONATE CLOTH 2 % EX PADS
6.0000 | MEDICATED_PAD | Freq: Once | CUTANEOUS | Status: DC
  Filled 2017-08-24: qty 6

## 2017-08-24 NOTE — Patient Instructions (Signed)
Your procedure is scheduled on: 08/31/17 Tues Report to Same Day Surgery 2nd floor medical mall [facility] Entrance-take elevator on left to 2nd floor.  Check in with surgery information desk.) To find out your arrival time please call [phone] between 1PM - 3PM on 08/30/17 Mon  Remember: Instructions that are not followed completely may result in serious medical risk, up to and including death, or upon the discretion of your surgeon and anesthesiologist your surgery may need to be rescheduled.    _x___ 1. Do not eat food after midnight the night before your procedure. You may drink clear liquids up to 2 hours before you are scheduled to arrive at the hospital for your procedure.  Do not drink clear liquids within 2 hours of your scheduled arrival to the hospital.  Clear liquids include  --Water or Apple juice without pulp  --Clear carbohydrate beverage such as ClearFast or Gatorade  --Black Coffee or Clear Tea (No milk, no creamers, do not add anything to                  the coffee or Tea Type 1 and type 2 diabetics should only drink water.  No gum chewing or hard candies.     __x__ 2. No Alcohol for 24 hours before or after surgery.   __x__3. No Smoking or e-cigarettes for 24 prior to surgery.  Do not use any chewable tobacco products for at least 6 hour prior to surgery   ____  4. Bring all medications with you on the day of surgery if instructed.    __x__ 5. Notify your doctor if there is any change in your medical condition     (cold, fever, infections).    x___6. On the morning of surgery brush your teeth with toothpaste and water.  You may rinse your mouth with mouth wash if you wish.  Do not swallow any toothpaste or mouthwash.   Do not wear jewelry, make-up, hairpins, clips or nail polish.  Do not wear lotions, powders, or perfumes. You may wear deodorant.  Do not shave 48 hours prior to surgery. Men may shave face and neck.  Do not bring valuables to the hospital.     [facility] is not responsible for any belongings or valuables.               Contacts, dentures or bridgework may not be worn into surgery.  Leave your suitcase in the car. After surgery it may be brought to your room.  For patients admitted to the hospital, discharge time is determined by your                       treatment team.  _  Patients discharged the day of surgery will not be allowed to drive home.  You will need someone to drive you home and stay with you the night of your procedure.    Please read over the following fact sheets that you were given:   [facility] Preparing for Surgery and or MRSA Information   _x___ Take anti-hypertensive listed below, cardiac, seizure, asthma,     anti-reflux and psychiatric medicines. These include:  1. clonazePAM (KLONOPIN) 0.5 MG tablet  2.dexlansoprazole (DEXILANT) 60 MG capsule  3.levothyroxine (SYNTHROID, LEVOTHROID) 100 MCG tablet  4.Oxymetazoline HCl (MUCINEX NASAL SPRAY MOISTURE NA  5.rosuvastatin (CRESTOR) 20 MG tablet  6.  ____Fleets enema or Magnesium Citrate as directed.   _x___ Use CHG Soap or sage wipes  as directed on instruction sheet   ____ Use inhalers on the day of surgery and bring to hospital day of surgery  ____ Stop Metformin and Janumet 2 days prior to surgery.    ____ Take 1/2 of usual insulin dose the night before surgery and none on the morning     surgery.   _x___ Follow recommendations from Cardiologist, Pulmonologist or PCP regarding          stopping Aspirin, Coumadin, Plavix ,Eliquis, Effient, or Pradaxa, and Pletal.  Stopped Plavix yesterday. 08/23/17  X____Stop Anti-inflammatories such as Advil, Aleve, Ibuprofen, Motrin, Naproxen, Naprosyn, Goodies powders or aspirin products. OK to take Tylenol and                          Celebrex.   _x___ Stop supplements until after surgery.  But may continue Vitamin D, Vitamin B,       and multivitamin.   ____ Bring C-Pap to the hospital.

## 2017-08-24 NOTE — Telephone Encounter (Signed)
Something for drainage/ cough- sent in flonase nasal spray to [facility]

## 2017-08-24 NOTE — Pre-Procedure Instructions (Signed)
Congested cough, nasal congestion. " Clear to yellowish production." To call Dr Coralie Common regarding above mentioned.

## 2017-08-24 NOTE — Pre-Procedure Instructions (Addendum)
Cardiac Clearance Dr Nehemiah Massed  Plan   -Proceed to surgery and/or invasive procedure without restriction to pre or post operative and/or procedural care. The patient is at lowest risk possible for cardiovascular complications with surgical intervention and/or invasive procedure. Currently has no evidence active and/or significant angina and/or congestive heart failure. The patient may discontinue plavix 7 days prior to procedure and restart at a safe period thereafter -Patient has had council today on the significance of all forms of tobacco use and the related hazards. We have expressed the need to abstain from the use of tobacco products for prevention of cardiovascular disease, peripheral vascular disease, and of cardiovascular events. -There has been a discussion of the current guidelines for hypertension control. We will continue current medical regimen for hypertension control which will also help in risk factor modification of cardiovascular disease. The patient understands and agrees with the current plan. We will be watching for possible future side effects of these medications. Additional home blood pressure monitoring is recommended if able. -We have discussed risk reduction in the cardiovascular disease process by a continuation of lipid management with the current medication management for lipid reduction. The goals continue to be 30-50% lowering of LDL cholesterol in addition to lifestyle measures. This will include diet and improved activity level on a regular basis.The patient has an understanding of this discussion at this time and we will continue the appropriate strategy. Would recommend changing simvastatin to Crestor for better high intensity cholesterol therapy -Discontinuation of fenofibrate due to no evidence of high triglycerides and no evidence of risk reduction with the use of this medication -No restrictions to rehabilitation    Echo complete  Result Value Ref Range  LV  Ejection Fraction (%) 55  Aortic Valve Regurgitation Grade none  Aortic Valve Stenosis Grade none  Aortic Valve Max Velocity (m/s) 1.1 m/sec  Aortic Valve Stenosis Mean Gradient (mmHg) 2.7 mmHg  Mitral Valve Regurgitation Grade mild  Mitral Valve Stenosis Grade none  Tricuspid Valve Regurgitation Grade trivial  Tricuspid Valve Regurgitation Max Velocity (m/s) 1.8 m/sec  Right Ventricle Systolic Pressure (mmHg) 18.2 mmHg  LV End Diastolic Diameter (cm) 5 cm  LV End Systolic Diameter (cm) 3.5 cm  LV Septum Wall Thickness (cm) 1.1 cm  LV Posterior Wall Thickness (cm) 0.86 cm  Left Atrium Diameter (cm) 3.3 cm  Narrative  CARDIOLOGY DEPARTMENT [facility] [id number] [facility] Acct #: [id number] [address] [place], [place], [place] [address] Date: 08/06/2017 10: 90 AM Adult Male Age: 67 yrs ECHOCARDIOGRAM REPORT Outpatient KC^^KCWC STUDY:CHEST WALL TAPE:0000: 00: 0: 00: 00 MD1: Nehemiah Massed, MD Bruce ECHO:Yes DOPPLER:Yes FILE:0000-000-000 BP: 100/80 mmHg COLOR:Yes CONTRAST:No MACHINE:Philips RV BIOPSY:No 3D:No SOUND QLTY:Moderate Height: 68 in MEDIUM:None Weight: 170 lb BSA: 1.9 m2 _________________________________________________________________________________________ HISTORY: DOE REASON: Assess, LV function INDICATION: R06.02 Shortness of breath _________________________________________________________________________________________ ECHOCARDIOGRAPHIC MEASUREMENTS 2D DIMENSIONS AORTA Values Normal Range MAIN PA Values Normal Range Annulus: nm* [2.3-2.9] PA Main: nm* [1.5-2.1] Aorta Sin: nm* [3.1-3.7] RIGHT VENTRICLE ST Junction: nm* [2.6-3.2] RV Base: 3.3 cm [<4.2] Asc.Aorta: nm* [2.6-3.4] RV Mid: nm* [<3.5] LEFT VENTRICLE RV Length: nm* [<8.6] LVIDd: 5.0 cm [4.2-5.9] INFERIOR VENA CAVA LVIDs: 3.5 cm Max. IVC: nm* [<=2.1] FS: 29.8 % [>25] Min. IVC: nm* SWT: 1.1 cm [0.6-1.0] ------------------ PWT: 0.86 cm [0.6-1.0] nm* - not measured LEFT ATRIUM LA  Diam: 3.3 cm [3.0-4.0] LA A4C Area: nm* [<20] LA Volume: nm* [18-58] _________________________________________________________________________________________ ECHOCARDIOGRAPHIC DESCRIPTIONS AORTIC ROOT Size: Normal Dissection: INDETERM FOR DISSECTION AORTIC VALVE Leaflets:  Tricuspid Morphology: Normal Mobility: Fully mobile LEFT VENTRICLE Size: Normal Anterior: Normal Contraction: Normal Lateral: Normal Closest EF: >55% (Estimated) Septal: Normal LV Masses: No Masses Apical: Normal LVH: None Inferior: Normal Posterior: Normal Dias.FxClass: (Grade 1) relaxation abnormal, E/A reversal MITRAL VALVE Leaflets: Normal Mobility: Fully mobile Morphology: Normal LEFT ATRIUM Size: Normal LA Masses: No masses IA Septum: Normal IAS MAIN PA Size: Normal PULMONIC VALVE Morphology: Normal Mobility: Fully mobile RIGHT VENTRICLE RV Masses: No Masses Size: Normal Free Wall: Normal Contraction: Normal TRICUSPID VALVE Leaflets: Normal Mobility: Fully mobile Morphology: Normal RIGHT ATRIUM Size: Normal RA Other: None RA Mass: No masses PERICARDIUM Fluid: No effusion INFERIOR VENACAVA Size: Normal Normal respiratory collapse _________________________________________________________________________________________  DOPPLER ECHO and OTHER SPECIAL PROCEDURES Aortic: No AR No AS 114.7 cm/sec peak vel 5.3 mmHg peak grad 2.7 mmHg mean grad 3.1 cm^2 by DOPPLER Mitral: MILD MR No MS MV Inflow E Vel = 55.1 cm/sec MV Annulus E'Vel = 7.1 cm/sec E/E'Ratio = 7.8 Tricuspid: TRIVIAL TR No TS 182.4 cm/sec peak TR vel 18.3 mmHg peak RV pressure Pulmonary: TRIVIAL PR No PS _________________________________________________________________________________________ INTERPRETATION NORMAL LEFT VENTRICULAR SYSTOLIC FUNCTION WITH AN ESTIAMTED EF = 55 % NORMAL RIGHT VENTRICULAR SYSTOLIC FUNCTION MILD MITRAL VALVE INSUFFICIENCY TRACE TRICUSPID VALVE INSUFFICIENCY NO VALVULAR  STENOSIS _________________________________________________________________________________________ Electronically signed by MD Serafina Royals on 08/09/2017 11: 23 AM Performed By: Cephus Shelling, RVT Ordering Physician: Nehemiah Massed, MD Bruce _________________________________________________________________________________________

## 2017-08-30 MED ORDER — CEFAZOLIN SODIUM-DEXTROSE 2-4 GM/100ML-% IV SOLN
2.0000 g | INTRAVENOUS | Status: AC
  Administered 2017-08-31: 2 g via INTRAVENOUS

## 2017-08-31 ENCOUNTER — Ambulatory Visit: Payer: Medicare Other | Admitting: Certified Registered"

## 2017-08-31 ENCOUNTER — Ambulatory Visit
Admission: RE | Admit: 2017-08-31 | Discharge: 2017-08-31 | Disposition: A | Discharge status: Home / Self Care | Payer: Medicare Other | Source: Ambulatory Visit | Attending: Surgery | Admitting: Surgery

## 2017-08-31 ENCOUNTER — Encounter
Admission: RE | Disposition: A | Discharge status: Home / Self Care | Payer: Self-pay | Source: Ambulatory Visit | Attending: Surgery

## 2017-08-31 ENCOUNTER — Other Ambulatory Visit: Payer: Self-pay

## 2017-08-31 ENCOUNTER — Encounter: Payer: Self-pay | Admitting: *Deleted

## 2017-08-31 DIAGNOSIS — Z79899 Other long term (current) drug therapy: Secondary | ICD-10-CM | POA: Insufficient documentation

## 2017-08-31 DIAGNOSIS — Z882 Allergy status to sulfonamides status: Secondary | ICD-10-CM | POA: Diagnosis not present

## 2017-08-31 DIAGNOSIS — K432 Incisional hernia without obstruction or gangrene: Secondary | ICD-10-CM | POA: Diagnosis present

## 2017-08-31 DIAGNOSIS — Z809 Family history of malignant neoplasm, unspecified: Secondary | ICD-10-CM | POA: Diagnosis not present

## 2017-08-31 DIAGNOSIS — F418 Other specified anxiety disorders: Secondary | ICD-10-CM | POA: Diagnosis not present

## 2017-08-31 DIAGNOSIS — F329 Major depressive disorder, single episode, unspecified: Secondary | ICD-10-CM | POA: Diagnosis not present

## 2017-08-31 DIAGNOSIS — F17200 Nicotine dependence, unspecified, uncomplicated: Secondary | ICD-10-CM | POA: Diagnosis not present

## 2017-08-31 DIAGNOSIS — E039 Hypothyroidism, unspecified: Secondary | ICD-10-CM | POA: Insufficient documentation

## 2017-08-31 DIAGNOSIS — I700 Atherosclerosis of aorta: Secondary | ICD-10-CM | POA: Insufficient documentation

## 2017-08-31 DIAGNOSIS — Z833 Family history of diabetes mellitus: Secondary | ICD-10-CM | POA: Diagnosis not present

## 2017-08-31 DIAGNOSIS — I872 Venous insufficiency (chronic) (peripheral): Secondary | ICD-10-CM | POA: Diagnosis not present

## 2017-08-31 DIAGNOSIS — J45909 Unspecified asthma, uncomplicated: Secondary | ICD-10-CM | POA: Diagnosis not present

## 2017-08-31 DIAGNOSIS — Z8249 Family history of ischemic heart disease and other diseases of the circulatory system: Secondary | ICD-10-CM | POA: Insufficient documentation

## 2017-08-31 DIAGNOSIS — I739 Peripheral vascular disease, unspecified: Secondary | ICD-10-CM | POA: Diagnosis not present

## 2017-08-31 DIAGNOSIS — K219 Gastro-esophageal reflux disease without esophagitis: Secondary | ICD-10-CM | POA: Diagnosis not present

## 2017-08-31 DIAGNOSIS — E785 Hyperlipidemia, unspecified: Secondary | ICD-10-CM | POA: Diagnosis not present

## 2017-08-31 DIAGNOSIS — Z823 Family history of stroke: Secondary | ICD-10-CM | POA: Insufficient documentation

## 2017-08-31 DIAGNOSIS — Z9049 Acquired absence of other specified parts of digestive tract: Secondary | ICD-10-CM | POA: Diagnosis not present

## 2017-08-31 DIAGNOSIS — I10 Essential (primary) hypertension: Secondary | ICD-10-CM | POA: Diagnosis not present

## 2017-08-31 HISTORY — PX: INSERTION OF MESH: SHX5868

## 2017-08-31 HISTORY — PX: UMBILICAL HERNIA REPAIR: SHX196

## 2017-08-31 SURGERY — REPAIR, HERNIA, UMBILICAL, ADULT
Anesthesia: General | Wound class: Clean

## 2017-08-31 MED ORDER — FENTANYL CITRATE (PF) 100 MCG/2ML IJ SOLN: 25.0000 ug | INTRAMUSCULAR | Status: DC | PRN

## 2017-08-31 MED ORDER — LIDOCAINE HCL (PF) 1 % IJ SOLN
INTRAMUSCULAR | Status: AC
  Filled 2017-08-31: qty 30

## 2017-08-31 MED ORDER — EPHEDRINE SULFATE 50 MG/ML IJ SOLN
INTRAMUSCULAR | Status: DC | PRN
  Administered 2017-08-31 (×2): 10 mg via INTRAVENOUS

## 2017-08-31 MED ORDER — GABAPENTIN 300 MG PO CAPS
300.0000 mg | ORAL_CAPSULE | ORAL | Status: AC
  Administered 2017-08-31: 300 mg via ORAL

## 2017-08-31 MED ORDER — PROPOFOL 10 MG/ML IV BOLUS
INTRAVENOUS | Status: DC | PRN
  Administered 2017-08-31: 150 mg via INTRAVENOUS

## 2017-08-31 MED ORDER — SUGAMMADEX SODIUM 200 MG/2ML IV SOLN
INTRAVENOUS | Status: DC | PRN
  Administered 2017-08-31: 150 mg via INTRAVENOUS

## 2017-08-31 MED ORDER — LACTATED RINGERS IV SOLN
INTRAVENOUS | Status: DC
  Administered 2017-08-31 (×2): via INTRAVENOUS

## 2017-08-31 MED ORDER — FENTANYL CITRATE (PF) 100 MCG/2ML IJ SOLN
INTRAMUSCULAR | Status: DC | PRN
  Administered 2017-08-31: 50 ug via INTRAVENOUS

## 2017-08-31 MED ORDER — SEVOFLURANE IN SOLN
RESPIRATORY_TRACT | Status: AC
  Filled 2017-08-31: qty 250

## 2017-08-31 MED ORDER — OXYCODONE-ACETAMINOPHEN 5-325 MG PO TABS: 1.0000 | ORAL_TABLET | ORAL | 0 refills | Status: DC | PRN

## 2017-08-31 MED ORDER — ONDANSETRON HCL 4 MG/2ML IJ SOLN: 4.0000 mg | Freq: Once | INTRAMUSCULAR | Status: DC | PRN

## 2017-08-31 MED ORDER — LIDOCAINE HCL (CARDIAC) PF 100 MG/5ML IV SOSY
PREFILLED_SYRINGE | INTRAVENOUS | Status: DC | PRN
  Administered 2017-08-31: 100 mg via INTRAVENOUS

## 2017-08-31 MED ORDER — MIDAZOLAM HCL 2 MG/2ML IJ SOLN
INTRAMUSCULAR | Status: DC | PRN
  Administered 2017-08-31: 2 mg via INTRAVENOUS

## 2017-08-31 MED ORDER — GABAPENTIN 300 MG PO CAPS
ORAL_CAPSULE | ORAL | Status: AC
  Filled 2017-08-31: qty 1

## 2017-08-31 MED ORDER — SUCCINYLCHOLINE CHLORIDE 20 MG/ML IJ SOLN
INTRAMUSCULAR | Status: DC | PRN
  Administered 2017-08-31: 100 mg via INTRAVENOUS

## 2017-08-31 MED ORDER — PHENYLEPHRINE HCL 10 MG/ML IJ SOLN
INTRAMUSCULAR | Status: DC | PRN
  Administered 2017-08-31 (×3): 200 ug via INTRAVENOUS

## 2017-08-31 MED ORDER — ONDANSETRON HCL 4 MG/2ML IJ SOLN
INTRAMUSCULAR | Status: DC | PRN
  Administered 2017-08-31: 4 mg via INTRAVENOUS

## 2017-08-31 MED ORDER — FENTANYL CITRATE (PF) 100 MCG/2ML IJ SOLN
INTRAMUSCULAR | Status: AC
  Filled 2017-08-31: qty 2

## 2017-08-31 MED ORDER — ACETAMINOPHEN 500 MG PO TABS
1000.0000 mg | ORAL_TABLET | ORAL | Status: AC
  Administered 2017-08-31: 1000 mg via ORAL

## 2017-08-31 MED ORDER — ROCURONIUM BROMIDE 100 MG/10ML IV SOLN
INTRAVENOUS | Status: DC | PRN
  Administered 2017-08-31: 20 mg via INTRAVENOUS

## 2017-08-31 MED ORDER — DEXAMETHASONE SODIUM PHOSPHATE 10 MG/ML IJ SOLN
INTRAMUSCULAR | Status: DC | PRN
  Administered 2017-08-31: 6 mg via INTRAVENOUS

## 2017-08-31 MED ORDER — ACETAMINOPHEN 500 MG PO TABS
ORAL_TABLET | ORAL | Status: AC
  Filled 2017-08-31: qty 2

## 2017-08-31 MED ORDER — BUPIVACAINE HCL (PF) 0.5 % IJ SOLN
INTRAMUSCULAR | Status: AC
  Filled 2017-08-31: qty 30

## 2017-08-31 MED ORDER — MIDAZOLAM HCL 2 MG/2ML IJ SOLN
INTRAMUSCULAR | Status: AC
  Filled 2017-08-31: qty 2

## 2017-08-31 MED ORDER — PROPOFOL 10 MG/ML IV BOLUS
INTRAVENOUS | Status: AC
  Filled 2017-08-31: qty 20

## 2017-08-31 SURGICAL SUPPLY — 22 items
CHLORAPREP W/TINT 26ML (MISCELLANEOUS) ×3 IMPLANT
DECANTER SPIKE VIAL GLASS SM (MISCELLANEOUS) ×6 IMPLANT
DERMABOND ADVANCED (GAUZE/BANDAGES/DRESSINGS) ×2
DERMABOND ADVANCED .7 DNX12 (GAUZE/BANDAGES/DRESSINGS) ×1 IMPLANT
DRAPE LAPAROTOMY 77X122 PED (DRAPES) ×3 IMPLANT
ELECT REM PT RETURN 9FT ADLT (ELECTROSURGICAL) ×3
ELECTRODE REM PT RTRN 9FT ADLT (ELECTROSURGICAL) ×1 IMPLANT
GLOVE BIO SURGEON STRL SZ7 (GLOVE) ×3 IMPLANT
GLOVE BIOGEL PI IND STRL 7.5 (GLOVE) ×1 IMPLANT
GLOVE BIOGEL PI INDICATOR 7.5 (GLOVE) ×2
GOWN STRL REUS W/ TWL LRG LVL3 (GOWN DISPOSABLE) ×1 IMPLANT
GOWN STRL REUS W/TWL LRG LVL3 (GOWN DISPOSABLE) ×2
KIT TURNOVER KIT A (KITS) ×3 IMPLANT
MESH VENTRALEX ST 2.5 CRC MED (Mesh General) ×3 IMPLANT
NEEDLE HYPO 22GX1.5 SAFETY (NEEDLE) ×3 IMPLANT
NS IRRIG 1000ML POUR BTL (IV SOLUTION) ×3 IMPLANT
PACK BASIN MINOR ARMC (MISCELLANEOUS) ×3 IMPLANT
SUT ETHIBOND 0 MO6 C/R (SUTURE) ×3 IMPLANT
SUT MNCRL AB 4-0 PS2 18 (SUTURE) ×3 IMPLANT
SUT VIC AB 3-0 SH 27 (SUTURE) ×2
SUT VIC AB 3-0 SH 27X BRD (SUTURE) ×1 IMPLANT
SYR 10ML LL (SYRINGE) ×3 IMPLANT

## 2017-08-31 NOTE — Anesthesia Postprocedure Evaluation (Signed)
Anesthesia Post Note  Patient: Joel Flores  Procedure(s) Performed: HERNIA REPAIR UMBILICAL ADULT (N/A ) INSERTION OF MESH (N/A )  Patient location during evaluation: PACU Anesthesia Type: General Level of consciousness: awake and alert Pain management: pain level controlled Vital Signs Assessment: post-procedure vital signs reviewed and stable Respiratory status: spontaneous breathing, nonlabored ventilation, respiratory function stable and patient connected to nasal cannula oxygen Cardiovascular status: blood pressure returned to baseline and stable Postop Assessment: no apparent nausea or vomiting Anesthetic complications: no     Last Vitals:  Vitals:   08/31/17 1550 08/31/17 1613  BP:  123/71  Pulse: 78 67  Resp: (!) 21 18  Temp: 36.7 C (!) 36.2 C  SpO2: 93% 94%    Last Pain:  Vitals:   08/31/17 1613  TempSrc: Temporal  PainSc: 4                  Precious Haws Frankie Scipio

## 2017-08-31 NOTE — Anesthesia Procedure Notes (Signed)
Procedure Name: Intubation Date/Time: 08/31/2017 1:42 PM Performed by: Philbert Riser, CRNA Pre-anesthesia Checklist: Patient identified, Emergency Drugs available, Suction available, Patient being monitored and Timeout performed Patient Re-evaluated:Patient Re-evaluated prior to induction Oxygen Delivery Method: Circle system utilized and Simple face mask Preoxygenation: Pre-oxygenation with 100% oxygen Induction Type: IV induction Ventilation: Two handed mask ventilation required and Mask ventilation with difficulty Laryngoscope Size: Mac and 3 Grade View: Grade II Tube size: 7.5 mm Number of attempts: 1 Airway Equipment and Method: Stylet Placement Confirmation: ETT inserted through vocal cords under direct vision,  positive ETCO2 and breath sounds checked- equal and bilateral Secured at: 22 cm Tube secured with: Tape Dental Injury: Teeth and Oropharynx as per pre-operative assessment

## 2017-08-31 NOTE — Transfer of Care (Signed)
Immediate Anesthesia Transfer of Care Note  Patient: Joel Flores  Procedure(s) Performed: HERNIA REPAIR UMBILICAL ADULT (N/A ) INSERTION OF MESH (N/A )  Patient Location: PACU  Anesthesia Type:General  Level of Consciousness: awake, alert  and oriented  Airway & Oxygen Therapy: Patient Spontanous Breathing and Patient connected to face mask oxygen  Post-op Assessment: Report given to RN and Post -op Vital signs reviewed and stable  Post vital signs: Reviewed and stable  Last Vitals:  Vitals Value Taken Time  BP 117/77 08/31/2017  3:14 PM  Temp 36.8 C 08/31/2017  3:14 PM  Pulse 86 08/31/2017  3:14 PM  Resp 21 08/31/2017  3:14 PM  SpO2 99 % 08/31/2017  3:14 PM  Vitals shown include unvalidated device data.  Last Pain:  Vitals:   08/31/17 1217  TempSrc: Tympanic  PainSc: 0-No pain         Complications: No apparent anesthesia complications

## 2017-08-31 NOTE — Anesthesia Preprocedure Evaluation (Signed)
Anesthesia Evaluation  Patient identified by MRN, date of birth, ID band Patient awake    Reviewed: Allergy & Precautions, NPO status , Patient's Chart, lab work & pertinent test results  Airway Mallampati: II  TM Distance: >3 FB     Dental  (+) Teeth Intact   Pulmonary asthma , Current Smoker,    Pulmonary exam normal        Cardiovascular hypertension, Pt. on medications + Peripheral Vascular Disease  Normal cardiovascular exam     Neuro/Psych PSYCHIATRIC DISORDERS Anxiety Depression    GI/Hepatic Neg liver ROS, GERD  ,  Endo/Other  Hypothyroidism   Renal/GU negative Renal ROS  negative genitourinary   Musculoskeletal   Abdominal Normal abdominal exam  (+)   Peds negative pediatric ROS (+)  Hematology negative hematology ROS (+)   Anesthesia Other Findings   Reproductive/Obstetrics                             Anesthesia Physical Anesthesia Plan  ASA: III  Anesthesia Plan: General   Post-op Pain Management:    Induction: Intravenous  PONV Risk Score and Plan:   Airway Management Planned: Oral ETT  Additional Equipment:   Intra-op Plan:   Post-operative Plan: Extubation in OR  Informed Consent: I have reviewed the patients History and Physical, chart, labs and discussed the procedure including the risks, benefits and alternatives for the proposed anesthesia with the patient or authorized representative who has indicated his/her understanding and acceptance.   Dental advisory given  Plan Discussed with: CRNA and Surgeon  Anesthesia Plan Comments:         Anesthesia Quick Evaluation

## 2017-08-31 NOTE — Op Note (Signed)
SURGICAL OPERATIVE REPORT  DATE OF PROCEDURE: 08/31/2017  ATTENDING Surgeon(s): Vickie Epley, MD  ANESTHESIA: GETA  PRE-OPERATIVE DIAGNOSIS: Symptomatic (painful) reducible infraumbilical incisional hernia (icd-10: K43.2)  POST-OPERATIVE DIAGNOSIS: Symptomatic (painful) reducible infraumbilical incisional hernia (icd-10: K43.2)  PROCEDURE(S):  1.) Open repair of symptomatic (painful) peri-umbilical incisional hernia with mesh (cpt: 75102) 2.) Implantation of mesh for open repair of incisional abdominal wall hernia (cpt: 58527)  INTRAOPERATIVE FINDINGS: 2 cm infraumbilical hernia with adherent loop of small intestine and hernia sac  INTRAVENOUS FLUIDS: 1200 mL crystalloid   ESTIMATED BLOOD LOSS: Minimal (<20 mL)   URINE OUTPUT: No Foley catheter  SPECIMENS: None  IMPLANTS: 6.4 cm Bard Ventralex ST ventral hernia repair mesh  DRAINS: None  COMPLICATIONS: None apparent  CONDITION AT END OF PROCEDURE: Hemodynamically stable and extubated  DISPOSITION OF PATIENT: PACU  INDICATIONS FOR PROCEDURE:  Patient is a 67 y.o. male who presented upon referral from his PMD for evaluation of patient's umbilical hernia. Patient reports he first noticed a small asymptomatic umbilical hernia 6 months prior to having underwent laparoscopic cholecystectomy at [facility] (10/2016), at which time patient says he was told the surgeon placed the camera through his umbilical hernia, but was not able to be repair the hernia at time of cholecystectomy. Since then, he says the hernia has become larger and more symptomatic. He denies abdominal distention, change in bowel function, or N/V and likewise deniesconstipation, straining with urination, or coughing and expressed that he'd like to have it repaired. All risks, benefits, and alternatives to above elective procedure were discussed with the patient, all of patient's questions were answered to his expressed satisfaction, and informed consent was obtained and  documented accordingly.  DETAILS OF PROCEDURE: Patient was brought to the operating suite and appropriately identified. General anesthesia was administered along with appropriate pre-operative antibiotics, and endotracheal intubation was performed by anesthetist. In supine position, operative site was prepped and draped in the usual sterile fashion, and following a brief time out, local anesthetic was injected inferior to the umbilicus, and a 3 cm transverse linear incision was made using a #15 blade scalpel over patient's prior infraumbilical incision and extended deep through subcutaneous tissues using blunt dissection and electrocautery. The hernia sac was then dissected from the undersurface of the umbilical skin and stalk, and the fascial defect was carefully cleared of a single loop of adherent small intestine, which was then closely re-inspected. Though minimal trauma to 1 - 2 cm of small bowel serosa was appreciated, 3-0 Vicryl suture was used to approximate serosa and peri-intestinal fat over the minimal superficial defect, which was felt to be appropriately intact for mesh repair of patient's infra-umbilical port site hernia. A 6.4 cm Bard Ventralex ST mesh patch was selected, inserted, confirmed to approximate well to fascial edges without intervening viscera or omentum, and patch was secured without tension using Ethibond braided non-absorbable suture. The umbilicus was then invaginated using 2-0 Vicryl suture from the dermis to fascia, and the wound was irrigated using sterile saline. Overlying tissues were re-approximated in layers using buried interrupted 3-0 Vicryl suture for dermis and running subcuticular 4-0 Monocryl suture to re-approximate skin, which was cleaned and dried and sterile Dermabond skin glue was applied and allowed to dry.  Patient was then safely able to be extubated, awakened, and transferred to PACU for post-operative monitoring and care.  I was present for all aspects of  the above procedure, and no operative complications were apparent.

## 2017-08-31 NOTE — OR Nursing (Signed)
Some abd distentetion noted

## 2017-08-31 NOTE — H&P (Addendum)
Surgical Clinic History and Physical  Referring provider:  Juline Patch, MD  [address]  [place], [place] [address]   HISTORY OF PRESENT ILLNESS (HPI):  67 y.o. male presents for evaluation of epigastric and umbilical "bulges". Patient reports he first noticed a small asymptomatic umbilical hernia 6 months prior to having underwent laparoscopic cholecystectomy at [facility] (10/2016), at which time patient says he was told the surgeon placed the camera through his umbilical hernia, but was not able to be repair the hernia at time of cholecystectomy. Since then, he says the hernia has become larger and more symptomatic. He also says he's noticed a bulge along his mid-upper abdomen. The this latter does not cause him pain, he reports it causes him anxiety. Patient otherwise denies constipation, frequent cough (no hemoptysis), obstructive symptoms, or urinary straining.   Of note, patient still smokes, but he reports that he is able to ascend/descend a flight of steps without stopping for CP or SOB. He, however, does not walk far, due to Right hip pain without Right calf pain with/without walking, now 10 years ago s/p placement of Right iliac artery stent for which he has more recently been seeing Dr. Lucky Cowboy for follow-up (not who placed stent). He has also since placement of Right iliac artery stent been taking once daily Plavix without aspirin. ABI's recently checked by Dr. Lucky Cowboy were WNL, and patient is scheduled for annual follow-up with Dr. Lucky Cowboy this coming July.  PAST MEDICAL HISTORY (PMH):      Past Medical History:  Diagnosis Date  . Anxiety   . Atherosclerosis of aorta ([place]) 02/12/2015  . Depression   . Essential hypertension 02/12/2015  . GERD (gastroesophageal reflux disease)   . Hyperlipidemia   . Hypertension   . PVD (peripheral vascular disease) ([place]) 11/17/2016  . Thyroid disease   . Venous insufficiency    PAST SURGICAL HISTORY [facility]):       Past Surgical History:   Procedure Laterality Date  . CHOLECYSTECTOMY    . COLONOSCOPY  2015   Dr Allen Norris- cleared for 5 years   . VEIN SURGERY     2 stents in R) leg   MEDICATIONS:         Prior to Admission medications   Medication Sig Start Date End Date Taking? Authorizing Provider  cetirizine (ZYRTEC) 10 MG tablet Take 1 tablet (10 mg total) by mouth daily. 01/29/17  Yes Juline Patch, MD  Choline Fenofibrate (FENOFIBRIC ACID) 135 MG CPDR Take 1 capsule by mouth daily. 01/29/17  Yes Juline Patch, MD  clonazePAM (KLONOPIN) 0.5 MG tablet Take 1 tablet (0.5 mg total) by mouth daily. Take one half bid  Patient taking differently: Take 0.25 mg by mouth daily.  01/29/17  Yes Juline Patch, MD  clopidogrel (PLAVIX) 75 MG tablet Take 1 tablet (75 mg total) by mouth daily. 01/29/17  Yes Juline Patch, MD  dexlansoprazole (DEXILANT) 60 MG capsule Take 1 capsule (60 mg total) by mouth daily. 01/29/17  Yes Juline Patch, MD  levothyroxine (SYNTHROID, LEVOTHROID) 100 MCG tablet Take 1 tablet (100 mcg total) by mouth daily before breakfast. 01/29/17  Yes Juline Patch, MD  losartan (COZAAR) 50 MG tablet Take 1 tablet (50 mg total) by mouth daily. 01/29/17  Yes Juline Patch, MD  sertraline (ZOLOFT) 100 MG tablet Take 1 tablet (100 mg total) by mouth daily. 01/29/17  Yes Juline Patch, MD  simvastatin (ZOCOR) 80 MG tablet Take 1  tablet (80 mg total) by mouth daily. 01/29/17  Yes Juline Patch, MD  ALLERGIES:      Allergies  Allergen Reactions  . Sulfa Antibiotics Rash   SOCIAL HISTORY:  Social History        Socioeconomic History  . Marital status: Married    Spouse name: Not on file  . Number of children: Not on file  . Years of education: Not on file  . Highest education level: Not on file  Occupational History  . Not on file  Social Needs  . Financial resource strain: Not on file  . Food insecurity:    Worry: Not on file    Inability: Not on file  . Transportation needs:    Medical: Not on file     Non-medical: Not on file  Tobacco Use  . Smoking status: Current Every Day Smoker  . Smokeless tobacco: Never Used  Substance and Sexual Activity  . Alcohol use: Yes    Alcohol/week: 0.0 oz  . Drug use: No  . Sexual activity: Never  Lifestyle  . Physical activity:    Days per week: Not on file    Minutes per session: Not on file  . Stress: Not on file  Relationships  . Social connections:    Talks on phone: Not on file    Gets together: Not on file    Attends religious service: Not on file    Active member of club or organization: Not on file    Attends meetings of clubs or organizations: Not on file    Relationship status: Not on file  . Intimate partner violence:    Fear of current or ex partner: Not on file    Emotionally abused: Not on file    Physically abused: Not on file    Forced sexual activity: Not on file  Other Topics Concern  . Not on file  Social History Narrative  . Not on file   The patient currently resides (home / rehab facility / nursing home): Home  The patient normally is (ambulatory / bedbound): Ambulatory  FAMILY HISTORY:       Family History  Problem Relation Age of Onset  . Diabetes Mother   . Cancer Father   . Heart disease Father   . Stroke Father    Otherwise negative/non-contributory.   REVIEW OF SYSTEMS:  Constitutional: denies any other weight loss, fever, chills, or sweats  Eyes: denies any other vision changes, history of eye injury  ENT: denies sore throat, hearing problems  Respiratory: denies shortness of breath, wheezing  Cardiovascular: denies chest pain, palpitations  Gastrointestinal: abdominal pain, N/V, and bowel function as per HPI  Musculoskeletal: denies any other joint pains or cramps  Skin: Denies any other rashes or skin discolorations  Neurological: denies any other headache, dizziness, weakness  Psychiatric: Denies any other depression, anxiety  All other review of systems were otherwise negative   VITAL  SIGNS:  BP 130/81   Pulse 88   Temp 98 F (36.7 C) (Tympanic)   Resp 18   Ht 5\' 8"  (1.727 m)   Wt 167 lb (75.8 kg)   SpO2 97%   BMI 25.39 kg/m   PHYSICAL EXAM:  Constitutional:  -- Normal body habitus  -- Awake, alert, and oriented x3  Eyes:  -- Pupils equally round and reactive to light  -- No scleral icterus  Ear, nose, throat:  -- No jugular venous distension  -- No nasal drainage, bleeding  Pulmonary:  --  No crackles  -- Equal breath sounds bilaterally  -- Breathing non-labored at rest  Cardiovascular:  -- S1, S2 present  -- No pericardial rubs  Gastrointestinal:  -- Abdomen soft, nontender, non-distended, no guarding/rebound  -- Easily reducible and mildly tender to palpation umbilical hernia  -- No abdominal masses appreciated, pulsatile or otherwise  Musculoskeletal and Integumentary:  -- Wounds or skin discoloration: None appreciated except well-healed lap chole port site incisions  -- Extremities: B/L UE and LE FROM, hands and feet warm, no edema  Neurologic:  -- Motor function: Intact and symmetric  -- Sensation: Intact and symmetric   Labs:  CBC Latest Ref Rng & Units 11/02/2016 10/30/2016  WBC 3.8 - 10.6 K/uL 14.0(H) 18.2(H)  Hemoglobin 13.0 - 18.0 g/dL 13.3 14.4  Hematocrit 40.0 - 52.0 % 38.6(L) 42.0  Platelets 150 - 440 K/uL 326 349   CMP Latest Ref Rng & Units 01/29/2017 11/02/2016 10/30/2016  Glucose 65 - 99 mg/dL 92 - 130(H)  BUN 8 - 27 mg/dL 16 - 20  Creatinine 0.76 - 1.27 mg/dL 0.88 - 1.04  Sodium 134 - 144 mmol/L 140 - 137  Potassium 3.5 - 5.2 mmol/L 4.5 - 3.7  Chloride 96 - 106 mmol/L 101 - 104  CO2 20 - 29 mmol/L 23 - 25  Calcium 8.6 - 10.2 mg/dL 9.7 - 9.7  Total Protein 6.5 - 8.1 g/dL - 7.0 7.6  Total Bilirubin 0.3 - 1.2 mg/dL - 0.8 0.8  Alkaline Phos 38 - 126 U/L - 54 47  AST 15 - 41 U/L - 53(H) 31  ALT 17 - 63 U/L - 32 19   Imaging studies: No new pertinent imaging studies available for review   Assessment/Plan:  67 y.o. male with an  increasingly symptomatic anxiety-provoking reducible umbilical hernia, complicated by chronic single-agent Plavix antiplatelet therapy 10 years s/p placement of Right iliac arterial disease for what sounds like lifestyle-limiting claudication and by co-morbidities including HTN, HLD, PAD, chronic ongoing tobacco (smoking) abuse, GERD, thyroid disease (not otherwise specified), generalized anxiety disorder, and major depression disorder.    - patient confirms Plavix held since last Monday, 4/29  - all risks, benefits, and alternatives to elective repair of umbilical hernia with mesh were discussed with the patient, specifically including but not limited to placement of mesh, all of his questions were answered to his expressed satisfaction, patient expresses he wishes to proceed, and informed consent was obtained.   - long-term and peri-operative risks of ongoing tobacco abuse reviewed with patient, and smoking cessation was strongly encouraged   - will proceed with elective open repair of umbilical hernia with mesh as scheduled today  - anticipate return to clinic 2 weeks following above procedure   All of the above recommendations were discussed with the patient, and all of patient's questions were answered to his expressed satisfaction.   --  Marilynne Drivers Rosana Hoes, MD, [place]: [facility] - Partnering for exceptional care.  Office: [phone]

## 2017-08-31 NOTE — Anesthesia Post-op Follow-up Note (Signed)
Anesthesia QCDR form completed.        

## 2017-08-31 NOTE — Discharge Instructions (Addendum)
In addition to included general post-operative instructions for Open Repair of Umbilical Hernia with mesh,  Diet: Resume home heart healthy diet.   Activity: No heavy lifting >15 pounds (children, pets, laundry, garbage) or strenuous activity until follow-up, but light activity and walking are encouraged. Do not drive or drink alcohol if taking narcotic pain medications.  Wound care: 2 days after surgery (Thursday, 5/9), you may shower/get incision wet with soapy water and pat dry (do not rub incisions), but no baths or submerging incision underwater until follow-up.   Medications: Resume all home medications. For mild to moderate pain: acetaminophen (Tylenol) or ibuprofen/naproxen (if no kidney disease). Combining Tylenol with alcohol can substantially increase your risk of causing liver disease. Narcotic pain medications, if prescribed, can be used for severe pain, though may cause nausea, constipation, and drowsiness. Do not combine Tylenol and Percocet (or similar) within a 6 hour period as Percocet (and similar) contain(s) Tylenol. If you do not need the narcotic pain medication, you do not need to fill the prescription.  Call office [phone]) at any time if any questions, worsening pain, fevers/chills, bleeding, drainage from incision site, or other concerns.    AMBULATORY SURGERY  DISCHARGE INSTRUCTIONS   1) The drugs that you were given will stay in your system until tomorrow so for the next 24 hours you should not:  A) Drive an automobile B) Make any legal decisions C) Drink any alcoholic beverage   2) You may resume regular meals tomorrow.  Today it is better to start with liquids and gradually work up to solid foods.  You may eat anything you prefer, but it is better to start with liquids, then soup and crackers, and gradually work up to solid foods.   3) Please notify your doctor immediately if you have any unusual bleeding, trouble breathing, redness and pain at the  surgery site, drainage, fever, or pain not relieved by medication.    4) Additional Instructions:        Please contact your physician with any problems or Same Day Surgery at [phone], Monday through Friday 6 am to 4 pm, or [place] at [facility] number at [phone].

## 2017-09-01 ENCOUNTER — Encounter: Payer: Self-pay | Admitting: Surgery

## 2017-09-02 ENCOUNTER — Other Ambulatory Visit: Payer: Self-pay

## 2017-09-07 ENCOUNTER — Telehealth: Payer: Self-pay | Admitting: Surgery

## 2017-09-07 NOTE — Telephone Encounter (Signed)
Patient is calling asking if he is able to go mow his lawn after having his surgery done on 08/31/17. Please call patient and advise.

## 2017-09-07 NOTE — Telephone Encounter (Signed)
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 Patient was not too happy with my response but I told him that because of him having surgery a week ago. I told him to hold on one more week before he could restart his regular activities. Patient stated that he understood but he just felt bored of doing nothing. I told him that I understood but we want him to make sure that he has enough time to heal properly and not cause another hernia. Patient then had no further questions.

## 2017-09-14 DIAGNOSIS — K432 Incisional hernia without obstruction or gangrene: Secondary | ICD-10-CM

## 2017-09-21 ENCOUNTER — Ambulatory Visit (INDEPENDENT_AMBULATORY_CARE_PROVIDER_SITE_OTHER): Payer: Medicare Other | Admitting: Surgery

## 2017-09-21 ENCOUNTER — Encounter: Payer: Self-pay | Admitting: Surgery

## 2017-09-21 VITALS — BP 130/87 | HR 100 | Temp 97.8°F | Wt 169.0 lb

## 2017-09-21 DIAGNOSIS — K432 Incisional hernia without obstruction or gangrene: Secondary | ICD-10-CM

## 2017-09-21 DIAGNOSIS — Z4889 Encounter for other specified surgical aftercare: Secondary | ICD-10-CM

## 2017-09-21 NOTE — Progress Notes (Signed)
Surgical Clinic Progress/Follow-up Note   HPI:  67 y.o. Male presents to clinic for post-op follow-up just under 3 weeks s/p open repair of peri-umbilical incisional hernia with mesh Rosana Hoes, 08/31/2017). Patient reports complete resolution of pre-operative pain and has been tolerating regular diet with +flatus and normal BM's, denies N/V, fever/chills, CP, or SOB.  Review of Systems:  Constitutional: denies fever/chills  Respiratory: denies shortness of breath, wheezing  Cardiovascular: denies chest pain, palpitations  Gastrointestinal: abdominal pain, N/V, and bowel function as per interval history Skin: Denies any other rashes or skin discolorations except post-surgical wounds  Vital Signs:  BP 130/87   Pulse 100   Temp 97.8 F (36.6 C) (Oral)   Wt 169 lb (76.7 kg)   BMI 25.70 kg/m    Physical Exam:  Constitutional:  -- Normal body habitus  -- Awake, alert, and oriented x3  Pulmonary:  -- No crackles -- Equal breath sounds bilaterally -- Breathing non-labored at rest Cardiovascular:  -- S1, S2 present  -- No pericardial rubs  Gastrointestinal:  -- Soft and non-distended, non-tender, no guarding/rebound tenderness -- Post-surgical incisions all well-approximated without any peri-incisional erythema or drainage -- No abdominal masses appreciated, pulsatile or otherwise  Musculoskeletal / Integumentary:  -- Wounds or skin discoloration: None appreciated except post-surgical incisions as described above (GI) -- Extremities: B/L UE and LE FROM, hands and feet warm, no edema   Assessment:  67 y.o. yo Male with a problem list including...  Patient Active Problem List   Diagnosis Date Noted  . Incisional hernia, without obstruction or gangrene   . SOBOE (shortness of breath on exertion) 07/30/2017  . PVD (peripheral vascular disease) ([place]) 11/17/2016  . Tobacco use disorder 11/17/2016  . Reactive airway disease, mild intermittent, uncomplicated 62/70/3500  . Abdominal  distension 08/12/2016  . Allergic rhinitis due to pollen 08/13/2015  . Nocturia 08/13/2015  . Atherosclerosis of aorta ([place]) 02/12/2015  . Essential hypertension 02/12/2015  . Esophageal reflux 02/12/2015  . Depression 02/12/2015  . Adult hypothyroidism 02/12/2015  . Chronic anxiety 02/12/2015  . Hyperlipemia 02/12/2015    presents to clinic for post-op follow-up evaluation, doing well just under 3 weeks s/p open repair of peri-umbilical incisional hernia with mesh Rosana Hoes, 08/31/2017).  Plan:              - advance diet as tolerated              - okay to submerge incisions under water (baths, swimming) prn             - gradually resume all activities except no heavy lifting >40 lbs x 1 month more             - apply sunblock particularly to incisions with sun exposure to reduce pigmentation of scars             - return to clinic as needed, instructed to call office if any questions or concerns  All of the above recommendations were discussed with the patient, and all of patient's questions were answered to his expressed satisfaction.  -- Marilynne Drivers Rosana Hoes, MD, [place]: [facility] - Partnering for exceptional care. Office: [phone]

## 2017-09-21 NOTE — Patient Instructions (Addendum)

## 2017-09-22 ENCOUNTER — Encounter: Payer: Medicare Other | Admitting: Surgery

## 2017-10-26 IMAGING — CR DG CHEST 2V
2 series · 2 of 2 positions shown · non-contrast
Comparison: PA and lateral chest x-ray January 24, 2010

CLINICAL DATA: Two weeks of productive cough with nasal congestion
and drainage ; history of COPD, bronchiolitis, reactive airway
disease.

EXAM:
CHEST  2 VIEW

[chest pa]
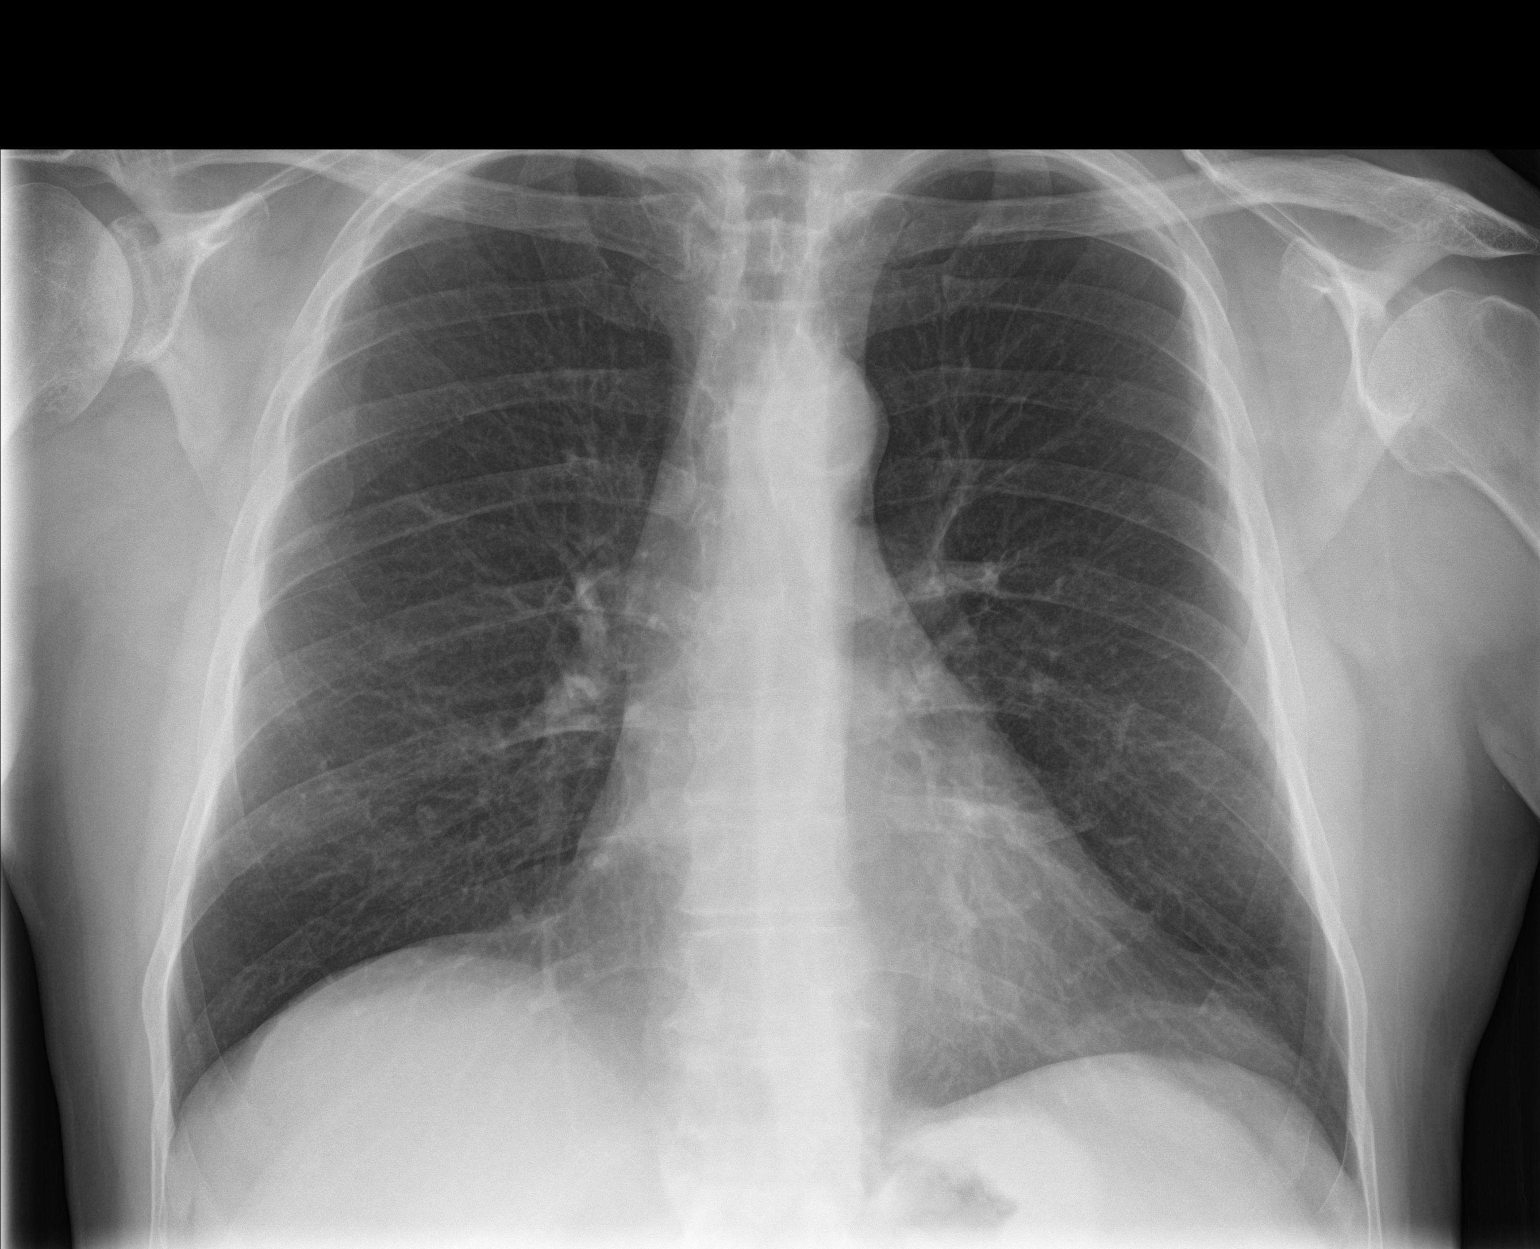

[chest lat]
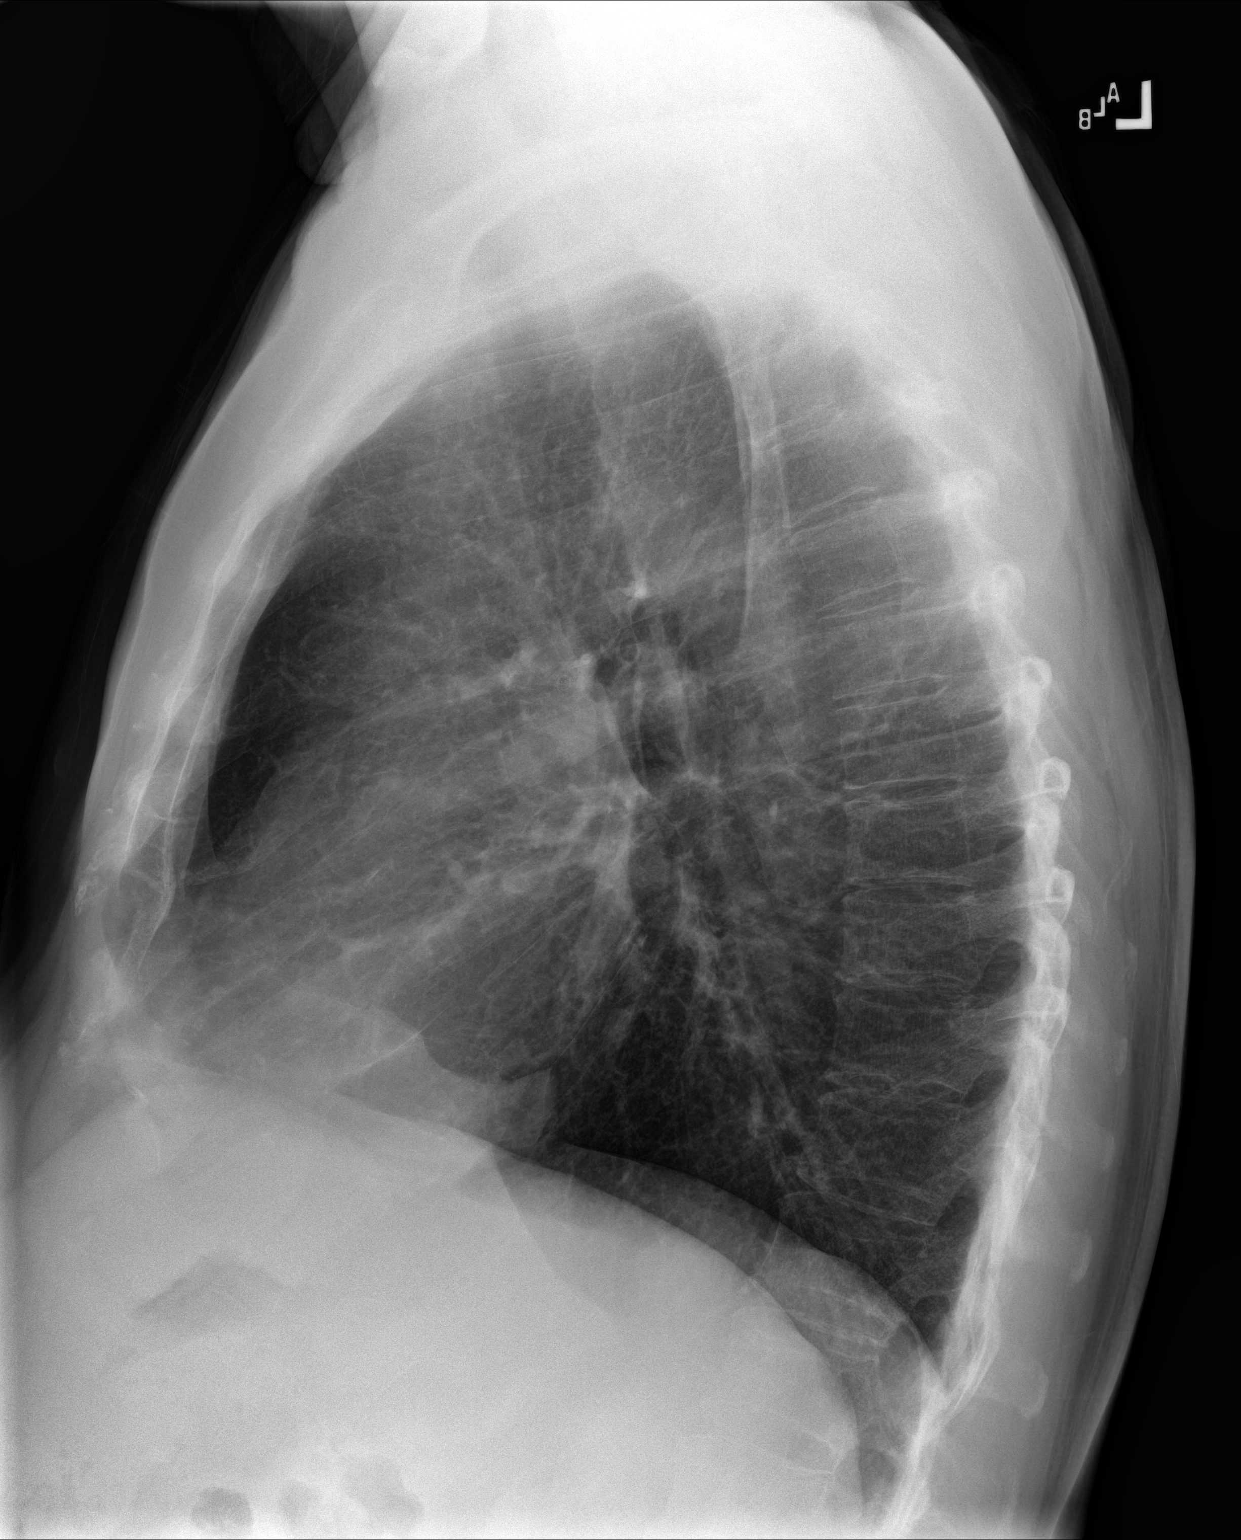

[2 of 2 positions shown; findings below may reference images not displayed]

FINDINGS: The lungs are mildly hyperinflated with hemidiaphragm flattening.
There is no focal infiltrate. There is no pleural effusion. The
heart and pulmonary vascularity are normal. The mediastinum is
normal in width. There is calcification in the wall of the aortic
arch. The bony thorax is unremarkable.
IMPRESSION: COPD-reactive airway disease.  There is no pneumonia nor CHF.  New

## 2017-11-19 ENCOUNTER — Ambulatory Visit (INDEPENDENT_AMBULATORY_CARE_PROVIDER_SITE_OTHER): Payer: Medicare Other

## 2017-11-19 ENCOUNTER — Ambulatory Visit (INDEPENDENT_AMBULATORY_CARE_PROVIDER_SITE_OTHER): Payer: Medicare Other | Admitting: Vascular Surgery

## 2017-11-19 ENCOUNTER — Encounter (INDEPENDENT_AMBULATORY_CARE_PROVIDER_SITE_OTHER): Payer: Self-pay | Admitting: Vascular Surgery

## 2017-11-19 VITALS — BP 126/86 | HR 89 | Resp 13 | Ht 67.0 in | Wt 167.0 lb

## 2017-11-19 DIAGNOSIS — I739 Peripheral vascular disease, unspecified: Secondary | ICD-10-CM | POA: Diagnosis not present

## 2017-11-19 DIAGNOSIS — I10 Essential (primary) hypertension: Secondary | ICD-10-CM | POA: Diagnosis not present

## 2017-11-19 DIAGNOSIS — I700 Atherosclerosis of aorta: Secondary | ICD-10-CM

## 2017-11-19 DIAGNOSIS — E785 Hyperlipidemia, unspecified: Secondary | ICD-10-CM | POA: Diagnosis not present

## 2017-11-19 NOTE — Assessment & Plan Note (Signed)
lipid control important in reducing the progression of atherosclerotic disease. Continue statin therapy  

## 2017-11-19 NOTE — Progress Notes (Signed)
MRN : [id number]  Joel Flores is a 67 y.o. (01-27-1951) male who presents with chief complaint of  Chief Complaint  Patient presents with  . Follow-up    1 year Aortic Iliac f/u  .  History of Present Illness: Patient returns today in follow up of PAD.  He is a little over a decade status post right iliac stent placement for short distance claudication.  He is doing well with no current claudication, ischemic rest pain, or tissue loss.  His ABIs are normal at 1.1 bilaterally with normal waveforms and digital pressures.  He has been having some memory issues and dizziness.  He has not had his carotids checked recently that he is aware of.  Current Outpatient Medications  Medication Sig Dispense Refill  . acetaminophen (TYLENOL) 325 MG tablet Take 325 mg by mouth daily as needed for moderate pain or headache.    . cetirizine (ZYRTEC) 10 MG tablet Take 1 tablet (10 mg total) by mouth daily. 30 tablet 11  . clonazePAM (KLONOPIN) 0.5 MG tablet Take 1 tablet (0.5 mg total) by mouth daily. Take one half bid (Patient taking differently: Take 0.25 mg by mouth daily. ) 90 tablet 1  . clopidogrel (PLAVIX) 75 MG tablet Take 1 tablet (75 mg total) by mouth daily. 90 tablet 1  . dexlansoprazole (DEXILANT) 60 MG capsule Take 1 capsule (60 mg total) by mouth daily. 90 capsule 1  . fluticasone (FLONASE) 50 MCG/ACT nasal spray Place 2 sprays into both nostrils daily. 16 g 6  . ibuprofen (ADVIL,MOTRIN) 100 MG tablet Take 100 mg by mouth every 6 (six) hours as needed for fever.    . levothyroxine (SYNTHROID, LEVOTHROID) 100 MCG tablet Take 1 tablet (100 mcg total) by mouth daily before breakfast. 90 tablet 1  . losartan (COZAAR) 50 MG tablet Take 1 tablet (50 mg total) by mouth daily. 90 tablet 1  . Oxymetazoline HCl (MUCINEX NASAL SPRAY MOISTURE NA) Place 1 spray into the nose daily as needed (congestion).    . rosuvastatin (CRESTOR) 20 MG tablet Take 20 mg by mouth daily.    . sertraline (ZOLOFT) 100 MG  tablet Take 100 mg by mouth daily.     No current facility-administered medications for this visit.     Past Medical History:  Diagnosis Date  . Anxiety   . Atherosclerosis of aorta ([place]) 02/12/2015  . Cough   . Depression   . Environmental and seasonal allergies   . Essential hypertension 02/12/2015  . GERD (gastroesophageal reflux disease)   . Hyperlipidemia   . Hypertension   . Hypothyroidism   . Incisional hernia, without obstruction or gangrene   . PVD (peripheral vascular disease) ([place]) 11/17/2016  . Thyroid disease   . Venous insufficiency     Past Surgical History:  Procedure Laterality Date  . CHOLECYSTECTOMY    . COLONOSCOPY  2015   Dr Allen Norris- cleared for 5 years   . INSERTION OF MESH N/A 08/31/2017   Procedure: INSERTION OF MESH;  Surgeon: Vickie Epley, MD;  Location: [facility] ORS;  Service: General;  Laterality: N/A;  . UMBILICAL HERNIA REPAIR N/A 08/31/2017   Procedure: HERNIA REPAIR UMBILICAL ADULT;  Surgeon: Vickie Epley, MD;  Location: [facility] ORS;  Service: General;  Laterality: N/A;  . VEIN SURGERY     2 stents in R) leg    Social History  Substance Use Topics  . Smoking status: Current Every Day Smoker  . Smokeless tobacco: Never Used  .  Alcohol use 0.0 oz/week      Family History      Family History  Problem Relation Age of Onset  . Diabetes Mother   . Cancer Father   . Heart disease Father   . Stroke Father          Allergies  Allergen Reactions  . Sulfa Antibiotics Rash     REVIEW OF SYSTEMS (Negative unless checked)  Constitutional: [] Weight loss  [] Fever  [] Chills Cardiac: [] Chest pain   [] Chest pressure   [] Palpitations   [] Shortness of breath when laying flat   [] Shortness of breath at rest   [] Shortness of breath with exertion. Vascular:  [] Pain in legs with walking   [] Pain in legs at rest   [] Pain in legs when laying flat   [] Claudication   [] Pain in feet when walking  [] Pain in feet at rest  [] Pain in feet when  laying flat   [] History of DVT   [] Phlebitis   [] Swelling in legs   [] Varicose veins   [] Non-healing ulcers Pulmonary:   [] Uses home oxygen   [] Productive cough   [] Hemoptysis   [] Wheeze  [] COPD   [] Asthma Neurologic:  [] Dizziness  [] Blackouts   [] Seizures   [] History of stroke   [] History of TIA  [] Aphasia   [] Temporary blindness   [] Dysphagia   [] Weakness or numbness in arms   [] Weakness or numbness in legs Musculoskeletal:  [] Arthritis   [] Joint swelling   [] Joint pain   [] Low back pain Hematologic:  [] Easy bruising  [] Easy bleeding   [] Hypercoagulable state   [] Anemic   Gastrointestinal:  [] Blood in stool   [] Vomiting blood  [] Gastroesophageal reflux/heartburn   [x] Abdominal pain Genitourinary:  [] Chronic kidney disease   [] Difficult urination  [] Frequent urination  [] Burning with urination   [] Hematuria Skin:  [] Rashes   [] Ulcers   [] Wounds Psychological:  [] History of anxiety   []  History of major depression.      Physical Examination  BP 126/86 (BP Location: Right Arm, Patient Position: Sitting)   Pulse 89   Resp 13   Ht 5\' 7"  (1.702 m)   Wt 167 lb (75.8 kg)   BMI 26.16 kg/m  Gen:  WD/WN, NAD Head: [place]/AT, No temporalis wasting. Ear/Nose/Throat: Hearing grossly intact, nares w/o erythema or drainage Eyes: Conjunctiva clear. Sclera non-icteric Neck: Supple.  Trachea midline Pulmonary:  Good air movement, no use of accessory muscles.  Cardiac: RRR, no JVD Vascular:  Vessel Right Left  Radial Palpable Palpable                          PT Palpable Palpable  DP Palpable Palpable    Musculoskeletal: M/S 5/5 throughout.  No deformity or atrophy. No edema. Neurologic: Sensation grossly intact in extremities.  Symmetrical.  Speech is fluent.  Psychiatric: Judgment intact, Mood & affect appropriate for pt's clinical situation. Dermatologic: No rashes or ulcers noted.  No cellulitis or open wounds.       Labs No results found for this or any previous visit (from the  past 2160 hour(s)).  Radiology No results found.  Assessment/Plan Essential hypertension blood pressure control important in reducing the progression of atherosclerotic disease. On appropriate oral medications.   Atherosclerosis of aorta (HCC) His noninvasive studies today demonstrate normal ABIs of approximately 1.1 bilaterally with good wave forms consistent efficiency.   Hyperlipemia lipid control important in reducing the progression of atherosclerotic disease. Continue statin therapy   PVD (peripheral vascular disease) ([place])  His ABIs are normal at 1.1 bilaterally with normal waveforms and digital pressures.  I am going to stretch out his follow-up to every other year at this point.  Given some cognitive issues and his multiple atherosclerotic risk factors, when he returns we are also going to check a carotid duplex.    Leotis Pain, MD  11/19/2017 9:46 AM    This note was created with Dragon medical transcription system.  Any errors from dictation are purely unintentional

## 2017-11-19 NOTE — Assessment & Plan Note (Signed)
His ABIs are normal at 1.1 bilaterally with normal waveforms and digital pressures.  I am going to stretch out his follow-up to every other year at this point.  Given some cognitive issues and his multiple atherosclerotic risk factors, when he returns we are also going to check a carotid duplex.

## 2017-12-04 ENCOUNTER — Other Ambulatory Visit: Payer: Self-pay

## 2017-12-04 ENCOUNTER — Encounter: Payer: Self-pay | Admitting: Emergency Medicine

## 2017-12-04 ENCOUNTER — Emergency Department
Admission: EM | Admit: 2017-12-04 | Discharge: 2017-12-04 | Disposition: A | Discharge status: Home / Self Care | Payer: Medicare Other | Attending: Emergency Medicine | Admitting: Emergency Medicine

## 2017-12-04 DIAGNOSIS — Z79899 Other long term (current) drug therapy: Secondary | ICD-10-CM | POA: Insufficient documentation

## 2017-12-04 DIAGNOSIS — I10 Essential (primary) hypertension: Secondary | ICD-10-CM | POA: Diagnosis not present

## 2017-12-04 DIAGNOSIS — L720 Epidermal cyst: Secondary | ICD-10-CM | POA: Diagnosis not present

## 2017-12-04 DIAGNOSIS — E039 Hypothyroidism, unspecified: Secondary | ICD-10-CM | POA: Diagnosis not present

## 2017-12-04 DIAGNOSIS — F17210 Nicotine dependence, cigarettes, uncomplicated: Secondary | ICD-10-CM | POA: Diagnosis not present

## 2017-12-04 DIAGNOSIS — J45909 Unspecified asthma, uncomplicated: Secondary | ICD-10-CM | POA: Insufficient documentation

## 2017-12-04 DIAGNOSIS — R220 Localized swelling, mass and lump, head: Secondary | ICD-10-CM | POA: Diagnosis present

## 2017-12-04 MED ORDER — LIDOCAINE HCL (PF) 1 % IJ SOLN
5.0000 mL | Freq: Once | INTRAMUSCULAR | Status: AC
  Administered 2017-12-04: 5 mL via INTRADERMAL

## 2017-12-04 MED ORDER — BACITRACIN-NEOMYCIN-POLYMYXIN 400-5-5000 EX OINT: 1.0000 "application " | TOPICAL_OINTMENT | Freq: Two times a day (BID) | CUTANEOUS | 0 refills | Status: DC

## 2017-12-04 MED ORDER — LIDOCAINE HCL (PF) 1 % IJ SOLN
INTRAMUSCULAR | Status: AC
  Administered 2017-12-04: 5 mL via INTRADERMAL
  Filled 2017-12-04: qty 5

## 2017-12-04 NOTE — ED Provider Notes (Signed)
[name] Emergency Department Provider Note  ____________________________________________  Time seen: Approximately 8:39 AM  I have reviewed the triage vital signs and the nursing notes.   HISTORY  Chief Complaint Abscess    HPI Joel Flores is a 67 y.o. male that presents to the emergency department for evaluation of increasingly painful mass to left side of face.  Patient went to dermatology when it started a couple of weeks ago and they told him that as long as it was not painful, it was not a problem.  He cannot remember what they said it was.  Lump has grown in size significantly in the last week.  It is mildly tender to touch.  He tried to get into see dermatology again but they were not able to schedule him an appointment until September.  He called his primary care provider and was told that they do not work on the face.  He would like area drained.  No fever, chills, difficulty opening or closing mouth.  Past Medical History:  Diagnosis Date  . Anxiety   . Atherosclerosis of aorta ([place]) 02/12/2015  . Cough   . Depression   . Environmental and seasonal allergies   . Essential hypertension 02/12/2015  . GERD (gastroesophageal reflux disease)   . Hyperlipidemia   . Hypertension   . Hypothyroidism   . Incisional hernia, without obstruction or gangrene   . PVD (peripheral vascular disease) ([place]) 11/17/2016  . Thyroid disease   . Venous insufficiency     Patient Active Problem List   Diagnosis Date Noted  . SOBOE (shortness of breath on exertion) 07/30/2017  . PVD (peripheral vascular disease) ([place]) 11/17/2016  . Tobacco use disorder 11/17/2016  . Reactive airway disease, mild intermittent, uncomplicated 33/82/5053  . Allergic rhinitis due to pollen 08/13/2015  . Nocturia 08/13/2015  . Atherosclerosis of aorta ([place]) 02/12/2015  . Essential hypertension 02/12/2015  . Esophageal reflux 02/12/2015  . Depression 02/12/2015  . Adult  hypothyroidism 02/12/2015  . Chronic anxiety 02/12/2015  . Hyperlipemia 02/12/2015    Past Surgical History:  Procedure Laterality Date  . CHOLECYSTECTOMY    . COLONOSCOPY  2015   Dr Allen Norris- cleared for 5 years   . INSERTION OF MESH N/A 08/31/2017   Procedure: INSERTION OF MESH;  Surgeon: Vickie Epley, MD;  Location: [facility] ORS;  Service: General;  Laterality: N/A;  . UMBILICAL HERNIA REPAIR N/A 08/31/2017   Procedure: HERNIA REPAIR UMBILICAL ADULT;  Surgeon: Vickie Epley, MD;  Location: [facility] ORS;  Service: General;  Laterality: N/A;  . VEIN SURGERY     2 stents in R) leg    Prior to Admission medications   Medication Sig Start Date End Date Taking? Authorizing Provider  acetaminophen (TYLENOL) 325 MG tablet Take 325 mg by mouth daily as needed for moderate pain or headache.    [provider]  cetirizine (ZYRTEC) 10 MG tablet Take 1 tablet (10 mg total) by mouth daily. 01/29/17   Juline Patch, MD  clonazePAM (KLONOPIN) 0.5 MG tablet Take 1 tablet (0.5 mg total) by mouth daily. Take one half bid Patient taking differently: Take 0.25 mg by mouth daily.  07/27/17   Juline Patch, MD  clopidogrel (PLAVIX) 75 MG tablet Take 1 tablet (75 mg total) by mouth daily. 07/27/17   Juline Patch, MD  dexlansoprazole (DEXILANT) 60 MG capsule Take 1 capsule (60 mg total) by mouth daily. 07/27/17   Juline Patch, MD  fluticasone Asencion Islam)  50 MCG/ACT nasal spray Place 2 sprays into both nostrils daily. 08/24/17   Juline Patch, MD  ibuprofen (ADVIL,MOTRIN) 100 MG tablet Take 100 mg by mouth every 6 (six) hours as needed for fever.    [provider]  levothyroxine (SYNTHROID, LEVOTHROID) 100 MCG tablet Take 1 tablet (100 mcg total) by mouth daily before breakfast. 07/27/17   Juline Patch, MD  losartan (COZAAR) 50 MG tablet Take 1 tablet (50 mg total) by mouth daily. 07/27/17   Juline Patch, MD  neomycin-bacitracin-polymyxin (NEOSPORIN) ointment Apply 1 application topically  every 12 (twelve) hours. 12/04/17   Laban Emperor, PA-C  Oxymetazoline HCl (MUCINEX NASAL SPRAY MOISTURE NA) Place 1 spray into the nose daily as needed (congestion).    [provider]  rosuvastatin (CRESTOR) 20 MG tablet Take 20 mg by mouth daily.    [provider]  sertraline (ZOLOFT) 100 MG tablet Take 100 mg by mouth daily.    [provider]    Allergies Sulfa antibiotics  Family History  Problem Relation Age of Onset  . Diabetes Mother   . Cancer Father   . Heart disease Father   . Stroke Father     Social History Social History   Tobacco Use  . Smoking status: Current Every Day Smoker    Packs/day: 1.50    Years: 50.00    Pack years: 75.00  . Smokeless tobacco: Never Used  Substance Use Topics  . Alcohol use: Yes    Alcohol/week: 1.0 standard drinks    Types: 1 Cans of beer per week  . Drug use: No     Review of Systems  Constitutional: No fever/chills Gastrointestinal: No nausea, no vomiting.  Musculoskeletal: Negative for musculoskeletal pain. Skin: Negative for rash, abrasions, lacerations, ecchymosis. Neurological: Negative for headaches   ____________________________________________   PHYSICAL EXAM:  VITAL SIGNS: ED Triage Vitals  Enc Vitals Group     BP 12/04/17 0812 128/79     Pulse Rate 12/04/17 0812 94     Resp 12/04/17 0812 16     Temp 12/04/17 0812 98.2 F (36.8 C)     Temp Source 12/04/17 0812 Oral     SpO2 12/04/17 0812 97 %     Weight 12/04/17 0813 172 lb (78 kg)     Height 12/04/17 0813 5\' 7"  (1.702 m)     Head Circumference --      Peak Flow --      Pain Score 12/04/17 0813 3     Pain Loc --      Pain Edu? --      Excl. in [place]? --      Constitutional: Alert and oriented. Well appearing and in no acute distress. Eyes: Conjunctivae are normal. PERRL. EOMI. Head: Atraumatic. ENT:      Ears:      Nose: No congestion/rhinnorhea.      Mouth/Throat: Mucous membranes are moist.  Neck: No stridor.   Cardiovascular: Normal rate, regular rhythm.  Good peripheral circulation. Respiratory: Normal respiratory effort without tachypnea or retractions. Lungs CTAB. Good air entry to the bases with no decreased or absent breath sounds. Musculoskeletal: Full range of motion to all extremities. No gross deformities appreciated. Neurologic:  Normal speech and language. No gross focal neurologic deficits are appreciated.  Skin:  Skin is warm, dry and intact. No rash noted.  1 cm x 1 cm fluctuant mass to left cheek.  Minimally tender to palpation. Psychiatric: Mood and affect are normal. Speech and  behavior are normal. Patient exhibits appropriate insight and judgement.   ____________________________________________   LABS (all labs ordered are listed, but only abnormal results are displayed)  Labs Reviewed - No data to display ____________________________________________  EKG   ____________________________________________  RADIOLOGY  No results found.  ____________________________________________    PROCEDURES  Procedure(s) performed:    Procedures  INCISION AND DRAINAGE Performed by: Laban Emperor Consent: Verbal consent obtained. Risks and benefits: risks, benefits and alternatives were discussed Type: cyst  Body area: left cheek  Anesthesia: local infiltration  Incision was made with a scalpel.  Local anesthetic: lidocaine 1 % without epinephrine  Anesthetic total: 1 ml  Drainage: thick white  Drainage amount: 1cc  Packing material: none  Patient tolerance: Patient tolerated the procedure well with no immediate complications.    Medications  lidocaine (PF) (XYLOCAINE) 1 % injection 5 mL (5 mLs Intradermal Given by Other 12/04/17 0902)     ____________________________________________   INITIAL IMPRESSION / ASSESSMENT AND PLAN / ED COURSE  Pertinent labs & imaging results that were available during my care of the patient were reviewed by me and  considered in my medical decision making (see chart for details).  Review of the [place] CSRS was performed in accordance of the [place] prior to dispensing any controlled drugs.   Patient's diagnosis is consistent with epidermoid cyst.  Vital signs and exam are reassuring.  We discussed that this does not look infected and I am willing to drain the cyst but it will possibly return.  Risks and benefits of procedure with patient were discussed.  Patient states that he wants it drained and that is why he came.  He will make an appointment on Monday to see dermatology in case cyst returns.  Patient is to follow up with dermatology as directed. Patient is given ED precautions to return to the ED for any worsening or new symptoms.     ____________________________________________  FINAL CLINICAL IMPRESSION(S) / ED DIAGNOSES  Final diagnoses:  Epidermal cyst      NEW MEDICATIONS STARTED DURING THIS VISIT:  ED Discharge Orders         Ordered    neomycin-bacitracin-polymyxin (NEOSPORIN) ointment  Every 12 hours     12/04/17 0852              This chart was dictated using voice recognition software/Dragon. Despite best efforts to proofread, errors can occur which can change the meaning. Any change was purely unintentional.    Laban Emperor, PA-C 12/04/17 1531    Nena Polio, MD 12/04/17 1630

## 2017-12-04 NOTE — ED Triage Notes (Signed)
Presents with possible abscess area to left side of face  States area is more tender and having pain into left ear

## 2017-12-20 ENCOUNTER — Ambulatory Visit: Payer: Medicare Other | Admitting: Family Medicine

## 2017-12-20 ENCOUNTER — Encounter: Payer: Self-pay | Admitting: Family Medicine

## 2017-12-20 VITALS — BP 128/64 | HR 76 | Ht 67.0 in | Wt 167.0 lb

## 2017-12-20 DIAGNOSIS — R1011 Right upper quadrant pain: Secondary | ICD-10-CM | POA: Diagnosis not present

## 2017-12-20 DIAGNOSIS — R1031 Right lower quadrant pain: Secondary | ICD-10-CM

## 2017-12-20 DIAGNOSIS — Z23 Encounter for immunization: Secondary | ICD-10-CM | POA: Diagnosis not present

## 2017-12-20 DIAGNOSIS — K760 Fatty (change of) liver, not elsewhere classified: Secondary | ICD-10-CM

## 2017-12-20 DIAGNOSIS — R109 Unspecified abdominal pain: Secondary | ICD-10-CM

## 2017-12-20 DIAGNOSIS — F17200 Nicotine dependence, unspecified, uncomplicated: Secondary | ICD-10-CM

## 2017-12-20 NOTE — Patient Instructions (Signed)
Fatty Liver Fatty liver, also called hepatic steatosis or steatohepatitis, is a condition in which too much fat has built up in your liver cells. The liver removes harmful substances from your bloodstream. It produces fluids your body needs. It also helps your body use and store energy from the food you eat. In many cases, fatty liver does not cause symptoms or problems. It is often diagnosed when tests are being done for other reasons. However, over time, fatty liver can cause inflammation that may lead to more serious liver problems, such as scarring of the liver (cirrhosis). What are the causes? Causes of fatty liver may include:  Drinking too much alcohol.  Poor nutrition.  Obesity.  Cushing syndrome.  Diabetes.  Hyperlipidemia.  Pregnancy.  Certain drugs.  Poisons.  Some viral infections.  What increases the risk? You may be more likely to develop fatty liver if you:  Abuse alcohol.  Are pregnant.  Are overweight.  Have diabetes.  Have hepatitis.  Have a high triglyceride level.  What are the signs or symptoms? Fatty liver often does not cause any symptoms. In cases where symptoms develop, they can include:  Fatigue.  Weakness.  Weight loss.  Confusion.  Abdominal pain.  Yellowing of your skin and the white parts of your eyes (jaundice).  Nausea and vomiting.  How is this diagnosed? Fatty liver may be diagnosed by:  Physical exam and medical history.  Blood tests.  Imaging tests, such as an ultrasound, CT scan, or MRI.  Liver biopsy. A small sample of liver tissue is removed using a needle. The sample is then looked at under a microscope.  How is this treated? Fatty liver is often caused by other health conditions. Treatment for fatty liver may involve medicines and lifestyle changes to manage conditions such as:  Alcoholism.  High cholesterol.  Diabetes.  Being overweight or obese.  Follow these instructions at home:  Eat a  healthy diet as directed by your health care provider.  Exercise regularly. This can help you lose weight and control your cholesterol and diabetes. Talk to your health care provider about an exercise plan and which activities are best for you.  Do not drink alcohol.  Take medicines only as directed by your health care provider. Contact a health care provider if: You have difficulty controlling your:  Blood sugar.  Cholesterol.  Alcohol consumption.  Get help right away if:  You have abdominal pain.  You have jaundice.  You have nausea and vomiting. This information is not intended to replace advice given to you by your health care provider. Make sure you discuss any questions you have with your health care provider. Document Released: 05/29/2005 Document Revised: 09/19/2015 Document Reviewed: 08/23/2013 Elsevier Interactive Patient Education  2018 Elsevier Inc.  

## 2017-12-20 NOTE — Progress Notes (Signed)
Name: Joel Flores   MRN: [id number]    DOB: 10/06/1950   Date:12/20/2017       Progress Note  Subjective  Chief Complaint  Chief Complaint  Patient presents with  . Consult    discuss liver?    Abdominal Pain  This is a recurrent problem. The current episode started 1 to 4 weeks ago (off and on less thab a minute). The onset quality is gradual. The problem occurs daily. The problem has been unchanged. The pain is located in the RUQ (and right flank). The pain is at a severity of 3/10. The pain is mild. The quality of the pain is aching and a sensation of fullness. The abdominal pain radiates to the right flank and back. Pertinent negatives include no belching, constipation, diarrhea, dysuria, fever, frequency, hematochezia, hematuria, melena, nausea, vomiting or weight loss. The pain is aggravated by certain positions (lying supine and on right side.). The pain is relieved by nothing. He has tried proton pump inhibitors (dexilant) for the symptoms. The treatment provided moderate relief. Prior diagnostic workup includes surgery. His past medical history is significant for gallstones.    No problem-specific Assessment & Plan notes found for this encounter.   Past Medical History:  Diagnosis Date  . Anxiety   . Atherosclerosis of aorta ([place]) 02/12/2015  . Cough   . Depression   . Environmental and seasonal allergies   . Essential hypertension 02/12/2015  . GERD (gastroesophageal reflux disease)   . Hyperlipidemia   . Hypertension   . Hypothyroidism   . Incisional hernia, without obstruction or gangrene   . PVD (peripheral vascular disease) ([place]) 11/17/2016  . Thyroid disease   . Venous insufficiency     Past Surgical History:  Procedure Laterality Date  . CHOLECYSTECTOMY    . COLONOSCOPY  2015   Dr Allen Norris- cleared for 5 years   . INSERTION OF MESH N/A 08/31/2017   Procedure: INSERTION OF MESH;  Surgeon: Vickie Epley, MD;  Location: [facility] ORS;  Service: General;  Laterality:  N/A;  . UMBILICAL HERNIA REPAIR N/A 08/31/2017   Procedure: HERNIA REPAIR UMBILICAL ADULT;  Surgeon: Vickie Epley, MD;  Location: [facility] ORS;  Service: General;  Laterality: N/A;  . VEIN SURGERY     2 stents in R) leg    Family History  Problem Relation Age of Onset  . Diabetes Mother   . Cancer Father   . Heart disease Father   . Stroke Father     Social History   Socioeconomic History  . Marital status: Married    Spouse name: Not on file  . Number of children: Not on file  . Years of education: Not on file  . Highest education level: Not on file  Occupational History  . Not on file  Social Needs  . Financial resource strain: Not on file  . Food insecurity:    Worry: Not on file    Inability: Not on file  . Transportation needs:    Medical: Not on file    Non-medical: Not on file  Tobacco Use  . Smoking status: Current Every Day Smoker    Packs/day: 1.50    Years: 50.00    Pack years: 75.00  . Smokeless tobacco: Never Used  Substance and Sexual Activity  . Alcohol use: Yes    Alcohol/week: 1.0 standard drinks    Types: 1 Cans of beer per week  . Drug use: No  . Sexual activity: Never  Lifestyle  .  Physical activity:    Days per week: Not on file    Minutes per session: Not on file  . Stress: Not on file  Relationships  . Social connections:    Talks on phone: Not on file    Gets together: Not on file    Attends religious service: Not on file    Active member of club or organization: Not on file    Attends meetings of clubs or organizations: Not on file    Relationship status: Not on file  . Intimate partner violence:    Fear of current or ex partner: Not on file    Emotionally abused: Not on file    Physically abused: Not on file    Forced sexual activity: Not on file  Other Topics Concern  . Not on file  Social History Narrative  . Not on file    Allergies  Allergen Reactions  . Sulfa Antibiotics Rash    Outpatient Medications Prior to  Visit  Medication Sig Dispense Refill  . acetaminophen (TYLENOL) 325 MG tablet Take 325 mg by mouth daily as needed for moderate pain or headache.    . cetirizine (ZYRTEC) 10 MG tablet Take 1 tablet (10 mg total) by mouth daily. 30 tablet 11  . clonazePAM (KLONOPIN) 0.5 MG tablet Take 1 tablet (0.5 mg total) by mouth daily. Take one half bid (Patient taking differently: Take 0.25 mg by mouth daily. ) 90 tablet 1  . clopidogrel (PLAVIX) 75 MG tablet Take 1 tablet (75 mg total) by mouth daily. 90 tablet 1  . dexlansoprazole (DEXILANT) 60 MG capsule Take 1 capsule (60 mg total) by mouth daily. 90 capsule 1  . fluticasone (FLONASE) 50 MCG/ACT nasal spray Place 2 sprays into both nostrils daily. 16 g 6  . ibuprofen (ADVIL,MOTRIN) 100 MG tablet Take 100 mg by mouth every 6 (six) hours as needed for fever.    . levothyroxine (SYNTHROID, LEVOTHROID) 100 MCG tablet Take 1 tablet (100 mcg total) by mouth daily before breakfast. 90 tablet 1  . losartan (COZAAR) 50 MG tablet Take 1 tablet (50 mg total) by mouth daily. 90 tablet 1  . neomycin-bacitracin-polymyxin (NEOSPORIN) ointment Apply 1 application topically every 12 (twelve) hours. 15 g 0  . rosuvastatin (CRESTOR) 20 MG tablet Take 20 mg by mouth daily.    . sertraline (ZOLOFT) 100 MG tablet Take 100 mg by mouth daily.    . Oxymetazoline HCl (MUCINEX NASAL SPRAY MOISTURE NA) Place 1 spray into the nose daily as needed (congestion).     No facility-administered medications prior to visit.     Review of Systems  Constitutional: Negative for fever and weight loss.  Gastrointestinal: Positive for abdominal pain. Negative for constipation, diarrhea, hematochezia, melena, nausea and vomiting.  Genitourinary: Negative for dysuria, frequency and hematuria.     Objective  Vitals:   12/20/17 1049  BP: 128/64  Pulse: 76  Weight: 167 lb (75.8 kg)  Height: 5\' 7"  (1.702 m)    Physical Exam  Constitutional: He is oriented to person, place, and time.   HENT:  Head: Normocephalic.  Right Ear: External ear normal.  Left Ear: External ear normal.  Nose: Nose normal.  Mouth/Throat: Oropharynx is clear and moist.  Eyes: Pupils are equal, round, and reactive to light. Conjunctivae and EOM are normal. Right eye exhibits no discharge. Left eye exhibits no discharge. No scleral icterus.  Neck: Normal range of motion. Neck supple. No JVD present. No tracheal deviation present. No thyromegaly  present.  Cardiovascular: Normal rate, regular rhythm, normal heart sounds and intact distal pulses. Exam reveals no gallop and no friction rub.  No murmur heard. Pulmonary/Chest: Breath sounds normal. No respiratory distress. He has no wheezes. He has no rales.  Abdominal: Soft. Bowel sounds are normal. He exhibits no pulsatile midline mass and no mass. There is no hepatosplenomegaly. There is tenderness in the right upper quadrant and right lower quadrant. There is no rebound, no guarding and no CVA tenderness.  Genitourinary: Rectum normal and prostate normal. Rectal exam shows no mass and guaiac negative stool.  Musculoskeletal: Normal range of motion. He exhibits no edema or tenderness.  Lymphadenopathy:    He has no cervical adenopathy.  Neurological: He is alert and oriented to person, place, and time. He has normal strength and normal reflexes. No cranial nerve deficit.  Skin: Skin is warm. No rash noted.  Nursing note and vitals reviewed.     Assessment & Plan  Problem List Items Addressed This Visit    None    Visit Diagnoses    Right lower quadrant abdominal pain    -  Primary   Recurrence of primarily right sided pain/worse past 3 weeks. check lipase/hepatic panel. check abd/pelvicultrasound.Referral toGI for colonoscopy/poss. endoscop   Relevant Orders   Lipase   Hepatic Function Panel (6)   US Abdomen Complete   US Pelvis Complete   Ambulatory referral to Gastroenterology   Tobacco dependence       discussed smoking cessation    Right upper quadrant abdominal pain       See above.   Relevant Orders   Lipase   Hepatic Function Panel (6)   US Abdomen Complete   US Pelvis Complete   Ambulatory referral to Gastroenterology   Right flank pain       Pain recurrent to right posterior flank. will obtain pelvic ulrasound.   Relevant Orders   Ambulatory referral to Gastroenterology   Steatosis of liver       Chronic persistent will check hepatic panel and lipase.   Need for pneumococcal vaccination       pneum 23 given   Relevant Orders   Pneumococcal polysaccharide vaccine 23-valent greater than or equal to 2yo subcutaneous/IM (Completed)      No orders of the defined types were placed in this encounter.     Dr. [facility]  12/20/17

## 2017-12-21 LAB — HEPATIC FUNCTION PANEL (6)
ALK PHOS: 110 IU/L (ref 39–117)
ALT: 12 IU/L (ref 0–44)
AST: 13 IU/L (ref 0–40)
Albumin: 4.2 g/dL (ref 3.6–4.8)
BILIRUBIN, DIRECT: 0.1 mg/dL (ref 0.00–0.40)
Bilirubin Total: 0.3 mg/dL (ref 0.0–1.2)

## 2017-12-21 LAB — LIPASE: Lipase: 29 U/L (ref 13–78)

## 2017-12-23 ENCOUNTER — Ambulatory Visit
Admission: RE | Admit: 2017-12-23 | Discharge: 2017-12-23 | Disposition: A | Discharge status: Home / Self Care | Payer: Medicare Other | Source: Ambulatory Visit | Attending: Family Medicine | Admitting: Family Medicine

## 2017-12-23 DIAGNOSIS — R932 Abnormal findings on diagnostic imaging of liver and biliary tract: Secondary | ICD-10-CM | POA: Insufficient documentation

## 2017-12-23 DIAGNOSIS — Z9049 Acquired absence of other specified parts of digestive tract: Secondary | ICD-10-CM | POA: Diagnosis not present

## 2017-12-23 DIAGNOSIS — R1031 Right lower quadrant pain: Secondary | ICD-10-CM | POA: Insufficient documentation

## 2017-12-23 DIAGNOSIS — R1011 Right upper quadrant pain: Secondary | ICD-10-CM | POA: Diagnosis not present

## 2017-12-24 ENCOUNTER — Telehealth: Payer: Self-pay

## 2017-12-24 NOTE — Telephone Encounter (Signed)
Called pt. And notified of appt being moved up to [place]. Sept. 4th @ 9:45 in [place]. Told to arrive at 9:30. I faxed last office note, labs and [place] results to [phone]. Have confirmation they went through.

## 2017-12-28 ENCOUNTER — Other Ambulatory Visit: Payer: Self-pay

## 2017-12-28 ENCOUNTER — Ambulatory Visit: Payer: Medicare Other | Admitting: Gastroenterology

## 2017-12-28 ENCOUNTER — Encounter: Payer: Self-pay | Admitting: Gastroenterology

## 2017-12-28 VITALS — BP 134/82 | HR 91 | Ht 67.0 in | Wt 164.8 lb

## 2017-12-28 DIAGNOSIS — G8929 Other chronic pain: Secondary | ICD-10-CM

## 2017-12-28 DIAGNOSIS — K760 Fatty (change of) liver, not elsewhere classified: Secondary | ICD-10-CM | POA: Diagnosis not present

## 2017-12-28 DIAGNOSIS — R1011 Right upper quadrant pain: Secondary | ICD-10-CM

## 2017-12-28 NOTE — Progress Notes (Signed)
Gastroenterology Consultation  Referring Provider:     Juline Patch, MD Primary Care Physician:  Juline Patch, MD Primary Gastroenterologist:  Dr. Allen Norris     Reason for Consultation:     Right-sided abdominal pain        HPI:   Joel Flores is a 67 y.o. y/o male referred for consultation & management of right-sided abdominal pain by Dr. Ronnald Ramp, Iven Finn, MD.  This patient comes in today with a history of right-sided abdominal pain.  The patient states that it happened shortly after he was stretching to reach for something and carrying something heavy.  The patient states that the pain is mostly over the incision where he had his gallbladder taken out.  The patient denies any nausea vomiting fevers or chills.  The patient did have a right upper quadrant ultrasound that showed fatty liver/hepatocellular disease.  The patient googled hepatocellular and came up with hepatocellular carcinoma and is concerned that he may have liver cancer.  The patient has normal liver enzymes with a normal white cell count and normal hemoglobin.  The patient did have a colonoscopy by me 10 years ago and was found to have adenomas and has not had a repeat colonoscopy.  The patient states he has not had a repeat colonoscopy because he has been hesitant to undergo any endoscopic procedures.  He reports that the abdominal pain is not present all the time and the patient has had his gallbladder removed in the past.  There is no report of any black stools or bloody stools.  He also denies any fevers chills nausea or vomiting.  Past Medical History:  Diagnosis Date  . Anxiety   . Atherosclerosis of aorta ([place]) 02/12/2015  . Cough   . Depression   . Environmental and seasonal allergies   . Essential hypertension 02/12/2015  . GERD (gastroesophageal reflux disease)   . Hyperlipidemia   . Hypertension   . Hypothyroidism   . Incisional hernia, without obstruction or gangrene   . PVD (peripheral vascular disease)  ([place]) 11/17/2016  . Thyroid disease   . Venous insufficiency     Past Surgical History:  Procedure Laterality Date  . CHOLECYSTECTOMY    . COLONOSCOPY  2015   Dr Allen Norris- cleared for 5 years   . INSERTION OF MESH N/A 08/31/2017   Procedure: INSERTION OF MESH;  Surgeon: Vickie Epley, MD;  Location: [facility] ORS;  Service: General;  Laterality: N/A;  . UMBILICAL HERNIA REPAIR N/A 08/31/2017   Procedure: HERNIA REPAIR UMBILICAL ADULT;  Surgeon: Vickie Epley, MD;  Location: [facility] ORS;  Service: General;  Laterality: N/A;  . VEIN SURGERY     2 stents in R) leg    Prior to Admission medications   Medication Sig Start Date End Date Taking? Authorizing Provider  cetirizine (ZYRTEC) 10 MG tablet Take 1 tablet (10 mg total) by mouth daily. 01/29/17  Yes Juline Patch, MD  clonazePAM (KLONOPIN) 0.5 MG tablet Take 1 tablet (0.5 mg total) by mouth daily. Take one half bid Patient taking differently: Take 0.25 mg by mouth daily.  07/27/17  Yes Juline Patch, MD  clopidogrel (PLAVIX) 75 MG tablet Take 1 tablet (75 mg total) by mouth daily. 07/27/17  Yes Juline Patch, MD  dexlansoprazole (DEXILANT) 60 MG capsule Take 1 capsule (60 mg total) by mouth daily. 07/27/17  Yes Juline Patch, MD  fluticasone (FLONASE) 50 MCG/ACT nasal spray Place 2 sprays into both nostrils  daily. 08/24/17  Yes Juline Patch, MD  ibuprofen (ADVIL,MOTRIN) 100 MG tablet Take 100 mg by mouth every 6 (six) hours as needed for fever.   Yes [provider]  levothyroxine (SYNTHROID, LEVOTHROID) 100 MCG tablet Take 1 tablet (100 mcg total) by mouth daily before breakfast. 07/27/17  Yes Juline Patch, MD  losartan (COZAAR) 50 MG tablet Take 1 tablet (50 mg total) by mouth daily. 07/27/17  Yes Juline Patch, MD  neomycin-bacitracin-polymyxin (NEOSPORIN) ointment Apply 1 application topically every 12 (twelve) hours. 12/04/17  Yes Laban Emperor, PA-C  rosuvastatin (CRESTOR) 20 MG tablet Take 20 mg by mouth daily.   Yes  [provider]  sertraline (ZOLOFT) 100 MG tablet Take 100 mg by mouth daily.   Yes [provider]  acetaminophen (TYLENOL) 325 MG tablet Take 325 mg by mouth daily as needed for moderate pain or headache.    [provider]    Family History  Problem Relation Age of Onset  . Diabetes Mother   . Cancer Father   . Heart disease Father   . Stroke Father      Social History   Tobacco Use  . Smoking status: Current Every Day Smoker    Packs/day: 1.50    Years: 50.00    Pack years: 75.00  . Smokeless tobacco: Never Used  Substance Use Topics  . Alcohol use: Yes    Alcohol/week: 1.0 standard drinks    Types: 1 Cans of beer per week  . Drug use: No    Allergies as of 12/28/2017 - Review Complete 12/28/2017  Allergen Reaction Noted  . Sulfa antibiotics Rash 01/16/2015    Review of Systems:    All systems reviewed and negative except where noted in HPI.   Physical Exam:  BP 134/82   Pulse 91   Ht 5\' 7"  (1.702 m)   Wt 164 lb 12.8 oz (74.8 kg)   BMI 25.81 kg/m  No LMP for male patient. General:   Alert,  Well-developed, well-nourished, pleasant and cooperative in NAD Head:  Normocephalic and atraumatic. Eyes:  Sclera clear, no icterus.   Conjunctiva pink. Ears:  Normal auditory acuity. Nose:  No deformity, discharge, or lesions. Mouth:  No deformity or lesions,oropharynx pink & moist. Neck:  Supple; no masses or thyromegaly. Lungs:  Respirations even and unlabored.  Clear throughout to auscultation.   No wheezes, crackles, or rhonchi. No acute distress. Heart:  Regular rate and rhythm; no murmurs, clicks, rubs, or gallops. Abdomen:  Normal bowel sounds.  No bruits.  Soft, non-tender and non-distended without masses, hepatosplenomegaly or hernias noted.  No guarding or rebound tenderness.  Negative Carnett sign.   Rectal:  Deferred.  Msk:  Symmetrical without gross deformities.  Good, equal movement & strength bilaterally. Pulses:  Normal  pulses noted. Extremities:  No clubbing or edema.  No cyanosis. Neurologic:  Alert and oriented x3;  grossly normal neurologically. Skin:  Intact without significant lesions or rashes.  No jaundice. Lymph Nodes:  No significant cervical adenopathy. Psych:  Alert and cooperative. Normal mood and affect.  Imaging Studies: US Abdomen Complete  Result Date: 12/23/2017 CLINICAL DATA:  Right upper quadrant pain.  Prior cholecystectomy. EXAM: ABDOMEN ULTRASOUND COMPLETE COMPARISON:  Ultrasound 08/17/2016. FINDINGS: Gallbladder: Cholecystectomy. Common bile duct: Diameter: 4.1 mm Liver: Heterogeneous hepatic parenchymal pattern consistent with fatty infiltration or hepatocellular disease. Portal vein is patent on color Doppler imaging with normal direction of blood flow towards the liver. IVC: No abnormality visualized. Pancreas:  Visualized portion unremarkable. Spleen: Size and appearance within normal limits. Right Kidney: Length: 11.1 cm. Echogenicity within normal limits. No mass or hydronephrosis visualized. Left Kidney: Length: 12.0 cm. Echogenicity within normal limits. Dromedary hump left kidney. No mass or hydronephrosis visualized. Abdominal aorta: No aneurysm visualized lower abdomen difficult to visualize due to overlying bowel gas. With a. Other findings: None. IMPRESSION: 1.  Cholecystectomy.  No biliary distention. 2. Heterogeneous hepatic parenchymal pattern consistent fatty infiltration and/or hepatocellular disease. No focal hepatic abnormality identified. Lower abdomen poorly visualized due to overlying bowel gas. Electronically Signed   By: Marcello Moores  Register   On: 12/23/2017 14:32   US Pelvis Complete  Result Date: 12/23/2017 CLINICAL DATA:  Right upper quadrant pain.  Prior cholecystectomy. EXAM: ABDOMEN ULTRASOUND COMPLETE COMPARISON:  Ultrasound 08/17/2016. FINDINGS: Gallbladder: Cholecystectomy. Common bile duct: Diameter: 4.1 mm Liver: Heterogeneous hepatic parenchymal pattern  consistent with fatty infiltration or hepatocellular disease. Portal vein is patent on color Doppler imaging with normal direction of blood flow towards the liver. IVC: No abnormality visualized. Pancreas: Visualized portion unremarkable. Spleen: Size and appearance within normal limits. Right Kidney: Length: 11.1 cm. Echogenicity within normal limits. No mass or hydronephrosis visualized. Left Kidney: Length: 12.0 cm. Echogenicity within normal limits. Dromedary hump left kidney. No mass or hydronephrosis visualized. Abdominal aorta: No aneurysm visualized lower abdomen difficult to visualize due to overlying bowel gas. With a. Other findings: None. IMPRESSION: 1.  Cholecystectomy.  No biliary distention. 2. Heterogeneous hepatic parenchymal pattern consistent fatty infiltration and/or hepatocellular disease. No focal hepatic abnormality identified. Lower abdomen poorly visualized due to overlying bowel gas. Electronically Signed   By: Marcello Moores  Register   On: 12/23/2017 14:32    Assessment and Plan:   Christophere Hillhouse Garms is a 67 y.o. y/o male who comes in today with a history of right upper quadrant pain that is likely muscle skeletal pain.  The pain was only slightly reproducible with flexion of the abdominal wall muscles.  There is no discomfort at the present time.  The patient has been assured that his fatty liver is not causing any abnormalities such as cirrhosis or abnormal liver enzymes.  Is also been told that the imaging did not show any sign of liver cancer since he had googled hepatocellular carcinoma and thought that is what he had.  The patient will be set up for a colonoscopy due to his history of adenomas and not having a repeat colonoscopy 10 years.  The patient has been explained the plan and agrees with it.  Lucilla Lame, MD. Mar[facility] Regal    Note: This dictation was prepared with Dragon dictation along with smaller phrase technology. Any transcriptional errors that result from this process are  unintentional.

## 2017-12-29 ENCOUNTER — Ambulatory Visit: Payer: Medicare Other | Admitting: Gastroenterology

## 2017-12-29 ENCOUNTER — Other Ambulatory Visit: Payer: Self-pay

## 2017-12-29 DIAGNOSIS — Z1211 Encounter for screening for malignant neoplasm of colon: Secondary | ICD-10-CM

## 2018-01-11 ENCOUNTER — Other Ambulatory Visit: Payer: Self-pay

## 2018-01-11 ENCOUNTER — Encounter: Payer: Self-pay | Admitting: *Deleted

## 2018-01-13 NOTE — Discharge Instructions (Signed)
General Anesthesia, Adult, Care After °These instructions provide you with information about caring for yourself after your procedure. Your health care provider may also give you more specific instructions. Your treatment has been planned according to current medical practices, but problems sometimes occur. Call your health care provider if you have any problems or questions after your procedure. °What can I expect after the procedure? °After the procedure, it is common to have: °· Vomiting. °· A sore throat. °· Mental slowness. ° °It is common to feel: °· Nauseous. °· Cold or shivery. °· Sleepy. °· Tired. °· Sore or achy, even in parts of your body where you did not have surgery. ° °Follow these instructions at home: °For at least 24 hours after the procedure: °· Do not: °? Participate in activities where you could fall or become injured. °? Drive. °? Use heavy machinery. °? Drink alcohol. °? Take sleeping pills or medicines that cause drowsiness. °? Make important decisions or sign legal documents. °? Take care of children on your own. °· Rest. °Eating and drinking °· If you vomit, drink water, juice, or soup when you can drink without vomiting. °· Drink enough fluid to keep your urine clear or pale yellow. °· Make sure you have little or no nausea before eating solid foods. °· Follow the diet recommended by your health care provider. °General instructions °· Have a responsible adult stay with you until you are awake and alert. °· Return to your normal activities as told by your health care provider. Ask your health care provider what activities are safe for you. °· Take over-the-counter and prescription medicines only as told by your health care provider. °· If you smoke, do not smoke without supervision. °· Keep all follow-up visits as told by your health care provider. This is important. °Contact a health care provider if: °· You continue to have nausea or vomiting at home, and medicines are not helpful. °· You  cannot drink fluids or start eating again. °· You cannot urinate after 8-12 hours. °· You develop a skin rash. °· You have fever. °· You have increasing redness at the site of your procedure. °Get help right away if: °· You have difficulty breathing. °· You have chest pain. °· You have unexpected bleeding. °· You feel that you are having a life-threatening or urgent problem. °This information is not intended to replace advice given to you by your health care provider. Make sure you discuss any questions you have with your health care provider. °Document Released: 07/20/2000 Document Revised: 09/16/2015 Document Reviewed: 03/28/2015 °Elsevier Interactive Patient Education © 2018 [employer] Inc[employer] ° °

## 2018-01-17 ENCOUNTER — Ambulatory Visit: Payer: Medicare Other | Admitting: Anesthesiology

## 2018-01-17 ENCOUNTER — Encounter
Admission: RE | Disposition: A | Discharge status: Home / Self Care | Payer: Self-pay | Source: Ambulatory Visit | Attending: Gastroenterology

## 2018-01-17 ENCOUNTER — Ambulatory Visit
Admission: RE | Admit: 2018-01-17 | Discharge: 2018-01-17 | Disposition: A | Discharge status: Home / Self Care | Payer: Medicare Other | Source: Ambulatory Visit | Attending: Gastroenterology | Admitting: Gastroenterology

## 2018-01-17 DIAGNOSIS — I10 Essential (primary) hypertension: Secondary | ICD-10-CM | POA: Insufficient documentation

## 2018-01-17 DIAGNOSIS — F17210 Nicotine dependence, cigarettes, uncomplicated: Secondary | ICD-10-CM | POA: Diagnosis not present

## 2018-01-17 DIAGNOSIS — E785 Hyperlipidemia, unspecified: Secondary | ICD-10-CM | POA: Diagnosis not present

## 2018-01-17 DIAGNOSIS — Z1211 Encounter for screening for malignant neoplasm of colon: Secondary | ICD-10-CM | POA: Diagnosis present

## 2018-01-17 DIAGNOSIS — I739 Peripheral vascular disease, unspecified: Secondary | ICD-10-CM | POA: Diagnosis not present

## 2018-01-17 DIAGNOSIS — M19011 Primary osteoarthritis, right shoulder: Secondary | ICD-10-CM | POA: Insufficient documentation

## 2018-01-17 DIAGNOSIS — Z8249 Family history of ischemic heart disease and other diseases of the circulatory system: Secondary | ICD-10-CM | POA: Insufficient documentation

## 2018-01-17 DIAGNOSIS — K5730 Diverticulosis of large intestine without perforation or abscess without bleeding: Secondary | ICD-10-CM | POA: Insufficient documentation

## 2018-01-17 DIAGNOSIS — F329 Major depressive disorder, single episode, unspecified: Secondary | ICD-10-CM | POA: Diagnosis not present

## 2018-01-17 DIAGNOSIS — Z7902 Long term (current) use of antithrombotics/antiplatelets: Secondary | ICD-10-CM | POA: Insufficient documentation

## 2018-01-17 DIAGNOSIS — I872 Venous insufficiency (chronic) (peripheral): Secondary | ICD-10-CM | POA: Insufficient documentation

## 2018-01-17 DIAGNOSIS — K640 First degree hemorrhoids: Secondary | ICD-10-CM | POA: Insufficient documentation

## 2018-01-17 DIAGNOSIS — D125 Benign neoplasm of sigmoid colon: Secondary | ICD-10-CM

## 2018-01-17 DIAGNOSIS — E039 Hypothyroidism, unspecified: Secondary | ICD-10-CM | POA: Insufficient documentation

## 2018-01-17 DIAGNOSIS — F419 Anxiety disorder, unspecified: Secondary | ICD-10-CM | POA: Diagnosis not present

## 2018-01-17 DIAGNOSIS — K635 Polyp of colon: Secondary | ICD-10-CM

## 2018-01-17 DIAGNOSIS — Z79899 Other long term (current) drug therapy: Secondary | ICD-10-CM | POA: Diagnosis not present

## 2018-01-17 HISTORY — DX: Dizziness and giddiness: R42

## 2018-01-17 HISTORY — DX: Presence of external hearing-aid: Z97.4

## 2018-01-17 HISTORY — DX: Family history of other specified conditions: Z84.89

## 2018-01-17 HISTORY — PX: COLONOSCOPY WITH PROPOFOL: SHX5780

## 2018-01-17 HISTORY — DX: Unspecified osteoarthritis, unspecified site: M19.90

## 2018-01-17 HISTORY — PX: POLYPECTOMY: SHX5525

## 2018-01-17 SURGERY — COLONOSCOPY WITH PROPOFOL
Anesthesia: General | Site: Rectum

## 2018-01-17 MED ORDER — LIDOCAINE HCL (CARDIAC) PF 100 MG/5ML IV SOSY
PREFILLED_SYRINGE | INTRAVENOUS | Status: DC | PRN
  Administered 2018-01-17: 20 mg via INTRAVENOUS

## 2018-01-17 MED ORDER — SODIUM CHLORIDE 0.9 % IV SOLN: INTRAVENOUS | Status: DC

## 2018-01-17 MED ORDER — PROPOFOL 10 MG/ML IV BOLUS
INTRAVENOUS | Status: DC | PRN
  Administered 2018-01-17: 100 mg via INTRAVENOUS
  Administered 2018-01-17 (×3): 40 mg via INTRAVENOUS

## 2018-01-17 MED ORDER — LACTATED RINGERS IV SOLN
1000.0000 mL | INTRAVENOUS | Status: DC
  Administered 2018-01-17: 1000 mL via INTRAVENOUS

## 2018-01-17 MED ORDER — STERILE WATER FOR IRRIGATION IR SOLN
Status: DC | PRN
  Administered 2018-01-17: .5 mL

## 2018-01-17 MED ORDER — ONDANSETRON HCL 4 MG/2ML IJ SOLN: 4.0000 mg | Freq: Once | INTRAMUSCULAR | Status: DC | PRN

## 2018-01-17 SURGICAL SUPPLY — 14 items
CANISTER SUCT 1200ML W/VALVE (MISCELLANEOUS) ×4 IMPLANT
CLIP HMST 235XBRD CATH ROT (MISCELLANEOUS) IMPLANT
CLIP RESOLUTION 360 11X235 (MISCELLANEOUS)
ELECT REM PT RETURN 9FT ADLT (ELECTROSURGICAL)
ELECTRODE REM PT RTRN 9FT ADLT (ELECTROSURGICAL) IMPLANT
FORCEPS BIOP RAD 4 LRG CAP 4 (CUTTING FORCEPS) IMPLANT
GOWN CVR UNV OPN BCK APRN NK (MISCELLANEOUS) ×4 IMPLANT
GOWN ISOL THUMB LOOP REG UNIV (MISCELLANEOUS) ×4
KIT ENDO PROCEDURE OLY (KITS) ×4 IMPLANT
RETRIEVER NET ROTH 2.5X230 LF (MISCELLANEOUS) IMPLANT
SNARE SHORT THROW 13M SML OVAL (MISCELLANEOUS) ×4 IMPLANT
SNARE SNG USE RND 15MM (INSTRUMENTS) IMPLANT
TRAP ETRAP POLY (MISCELLANEOUS) ×4 IMPLANT
WATER STERILE IRR 250ML POUR (IV SOLUTION) ×4 IMPLANT

## 2018-01-17 NOTE — Transfer of Care (Signed)
Immediate Anesthesia Transfer of Care Note  Patient: Joel Flores  Procedure(s) Performed: COLONOSCOPY WITH PROPOFOL (N/A Rectum) POLYPECTOMY (Rectum)  Patient Location: PACU  Anesthesia Type: General  Level of Consciousness: awake, alert  and patient cooperative  Airway and Oxygen Therapy: Patient Spontanous Breathing and Patient connected to supplemental oxygen  Post-op Assessment: Post-op Vital signs reviewed, Patient's Cardiovascular Status Stable, Respiratory Function Stable, Patent Airway and No signs of Nausea or vomiting  Post-op Vital Signs: Reviewed and stable  Complications: No apparent anesthesia complications

## 2018-01-17 NOTE — Op Note (Signed)
[facility] Patient Name: Joel Flores Procedure Date: 01/17/2018 7:12 AM MRN: [id number] Account #: [id number] Date of Birth: 1950-09-18 Admit Type: Outpatient Age: 67 Room: [facility] OR ROOM 01 Gender: Male Note Status: Finalized Procedure:            Colonoscopy Indications:          Screening for colorectal malignant neoplasm Providers:            Lucilla Lame MD, MD Referring MD:         Juline Patch, MD (Referring MD) Medicines:            Propofol per Anesthesia Complications:        No immediate complications. Procedure:            Pre-Anesthesia Assessment:                       - Prior to the procedure, a History and Physical was                        performed, and patient medications and allergies were                        reviewed. The patient's tolerance of previous                        anesthesia was also reviewed. The risks and benefits of                        the procedure and the sedation options and risks were                        discussed with the patient. All questions were                        answered, and informed consent was obtained. Prior                        Anticoagulants: The patient has taken no previous                        anticoagulant or antiplatelet agents. ASA Grade                        Assessment: II - A patient with mild systemic disease.                        After reviewing the risks and benefits, the patient was                        deemed in satisfactory condition to undergo the                        procedure.                       After obtaining informed consent, the colonoscope was                        passed under direct vision. Throughout the procedure,  the patient's blood pressure, pulse, and oxygen                        saturations were monitored continuously. The                        Colonoscope was introduced through the anus and    advanced to the the cecum, identified by appendiceal                        orifice and ileocecal valve. The colonoscopy was                        performed without difficulty. The patient tolerated the                        procedure well. The quality of the bowel preparation                        was excellent. Findings:      The perianal and digital rectal examinations were normal.      Two sessile polyps were found in the sigmoid colon. The polyps were 2 to       5 mm in size. These polyps were removed with a cold snare. Resection and       retrieval were complete.      Multiple small-mouthed diverticula were found in the sigmoid colon.      Non-bleeding internal hemorrhoids were found during retroflexion. The       hemorrhoids were Grade I (internal hemorrhoids that do not prolapse). Impression:           - Two 2 to 5 mm polyps in the sigmoid colon, removed                        with a cold snare. Resected and retrieved.                       - Diverticulosis in the sigmoid colon.                       - Non-bleeding internal hemorrhoids. Recommendation:       - Discharge patient to home.                       - Resume previous diet.                       - Continue present medications.                       - Await pathology results.                       - Repeat colonoscopy in 5 years for surveillance. Procedure Code(s):    --- Professional ---                       [phone], Colonoscopy, flexible; with removal of tumor(s),                        polyp(s), or other lesion(s) by snare technique Diagnosis Code(s):    --- Professional ---  Z12.11, Encounter for screening for malignant neoplasm                        of colon                       D12.5, Benign neoplasm of sigmoid colon CPT copyright 2017 [employer]. All rights reserved. The codes documented in this report are preliminary and upon coder review may  be revised to meet current  compliance requirements. Lucilla Lame MD, MD 01/17/2018 8:24:54 AM This report has been signed electronically. Number of Addenda: 0 Note Initiated On: 01/17/2018 7:12 AM Scope Withdrawal Time: 0 hours 10 minutes 12 seconds  Total Procedure Duration: 0 hours 15 minutes 13 seconds       [facility]

## 2018-01-17 NOTE — Anesthesia Postprocedure Evaluation (Signed)
Anesthesia Post Note  Patient: Elmore Hyslop Imparato  Procedure(s) Performed: COLONOSCOPY WITH PROPOFOL (N/A Rectum) POLYPECTOMY (Rectum)  Patient location during evaluation: PACU Anesthesia Type: General Level of consciousness: awake Pain management: pain level controlled Vital Signs Assessment: post-procedure vital signs reviewed and stable Respiratory status: respiratory function stable Cardiovascular status: stable Postop Assessment: no signs of nausea or vomiting Anesthetic complications: no    Veda Canning

## 2018-01-17 NOTE — Anesthesia Procedure Notes (Signed)
Date/Time: 01/17/2018 8:05 AM Performed by: Cameron Ali, CRNA Pre-anesthesia Checklist: Patient identified, Emergency Drugs available, Suction available, Timeout performed and Patient being monitored Patient Re-evaluated:Patient Re-evaluated prior to induction Oxygen Delivery Method: Nasal cannula Placement Confirmation: positive ETCO2

## 2018-01-17 NOTE — Anesthesia Preprocedure Evaluation (Signed)
Anesthesia Evaluation  Patient identified by MRN, date of birth, ID band Patient awake    Reviewed: Allergy & Precautions, NPO status , Patient's Chart, lab work & pertinent test results  Airway Mallampati: II  TM Distance: >3 FB     Dental  (+) Teeth Intact   Pulmonary asthma , Current Smoker,    breath sounds clear to auscultation       Cardiovascular hypertension, Pt. on medications + Peripheral Vascular Disease   Rhythm:Regular Rate:Normal     Neuro/Psych PSYCHIATRIC DISORDERS Anxiety Depression    GI/Hepatic Neg liver ROS, GERD  ,  Endo/Other  Hypothyroidism   Renal/GU negative Renal ROS  negative genitourinary   Musculoskeletal  (+) Arthritis ,   Abdominal   Peds negative pediatric ROS (+)  Hematology negative hematology ROS (+)   Anesthesia Other Findings   Reproductive/Obstetrics                             Anesthesia Physical  Anesthesia Plan  ASA: III  Anesthesia Plan: General   Post-op Pain Management:    Induction: Intravenous  PONV Risk Score and Plan:   Airway Management Planned: Simple Face Mask and Natural Airway  Additional Equipment:   Intra-op Plan:   Post-operative Plan:   Informed Consent: I have reviewed the patients History and Physical, chart, labs and discussed the procedure including the risks, benefits and alternatives for the proposed anesthesia with the patient or authorized representative who has indicated his/her understanding and acceptance.   Dental advisory given  Plan Discussed with: CRNA  Anesthesia Plan Comments:         Anesthesia Quick Evaluation

## 2018-01-17 NOTE — H&P (Signed)
Lucilla Lame, MD [address] [place], [place] [address] [phone] Fax : [phone]  Primary Care Physician:  Juline Patch, MD Primary Gastroenterologist:  Dr. Allen Norris  Pre-Procedure History & Physical: HPI:  Joel Flores is a 67 y.o. male is here for an colonoscopy.   Past Medical History:  Diagnosis Date  . Anxiety   . Arthritis    right shoulder  . Atherosclerosis of aorta ([place]) 02/12/2015  . Cough   . Depression   . Environmental and seasonal allergies   . Essential hypertension 02/12/2015  . Family history of adverse reaction to anesthesia    Father - 94 - MI, then stroke after Prostate surgery  . GERD (gastroesophageal reflux disease)   . Hyperlipidemia   . Hypertension   . Hypothyroidism   . Incisional hernia, without obstruction or gangrene   . PVD (peripheral vascular disease) ([place]) 11/17/2016  . Thyroid disease   . Venous insufficiency   . Vertigo    can happen daily  . Wears hearing aid in both ears    has, does not wear    Past Surgical History:  Procedure Laterality Date  . CHOLECYSTECTOMY    . COLONOSCOPY  2015   Dr Allen Norris- cleared for 5 years   . INSERTION OF MESH N/A 08/31/2017   Procedure: INSERTION OF MESH;  Surgeon: Vickie Epley, MD;  Location: [facility] ORS;  Service: General;  Laterality: N/A;  . UMBILICAL HERNIA REPAIR N/A 08/31/2017   Procedure: HERNIA REPAIR UMBILICAL ADULT;  Surgeon: Vickie Epley, MD;  Location: [facility] ORS;  Service: General;  Laterality: N/A;  . VEIN SURGERY     2 stents in R) leg    Prior to Admission medications   Medication Sig Start Date End Date Taking? Authorizing Provider  acetaminophen (TYLENOL) 325 MG tablet Take 325 mg by mouth daily as needed for moderate pain or headache.   Yes [provider]  cetirizine (ZYRTEC) 10 MG tablet Take 1 tablet (10 mg total) by mouth daily. 01/29/17  Yes Juline Patch, MD  clonazePAM (KLONOPIN) 0.5 MG tablet Take 1 tablet (0.5 mg total) by  mouth daily. Take one half bid Patient taking differently: Take 0.25 mg by mouth daily.  07/27/17  Yes Juline Patch, MD  clopidogrel (PLAVIX) 75 MG tablet Take 1 tablet (75 mg total) by mouth daily. 07/27/17  Yes Juline Patch, MD  dexlansoprazole (DEXILANT) 60 MG capsule Take 1 capsule (60 mg total) by mouth daily. 07/27/17  Yes Juline Patch, MD  fluticasone (FLONASE) 50 MCG/ACT nasal spray Place 2 sprays into both nostrils daily. 08/24/17  Yes Juline Patch, MD  ibuprofen (ADVIL,MOTRIN) 100 MG tablet Take 100 mg by mouth every 6 (six) hours as needed for fever.   Yes [provider]  levothyroxine (SYNTHROID, LEVOTHROID) 100 MCG tablet Take 1 tablet (100 mcg total) by mouth daily before breakfast. 07/27/17  Yes Juline Patch, MD  losartan (COZAAR) 50 MG tablet Take 1 tablet (50 mg total) by mouth daily. 07/27/17  Yes Juline Patch, MD  neomycin-bacitracin-polymyxin (NEOSPORIN) ointment Apply 1 application topically every 12 (twelve) hours. 12/04/17  Yes Laban Emperor, PA-C  rosuvastatin (CRESTOR) 20 MG tablet Take 20 mg by mouth daily.   Yes [provider]  sertraline (ZOLOFT) 100 MG tablet Take 100 mg by mouth daily.   Yes [provider]    Allergies as of 12/29/2017 - Review Complete 12/28/2017  Allergen Reaction Noted  .  Sulfa antibiotics Rash 01/16/2015    Family History  Problem Relation Age of Onset  . Diabetes Mother   . Cancer Father   . Heart disease Father   . Stroke Father     Social History   Socioeconomic History  . Marital status: Married    Spouse name: Not on file  . Number of children: Not on file  . Years of education: Not on file  . Highest education level: Not on file  Occupational History  . Not on file  Social Needs  . Financial resource strain: Not on file  . Food insecurity:    Worry: Not on file    Inability: Not on file  . Transportation needs:    Medical: Not on file    Non-medical: Not on file  Tobacco Use  .  Smoking status: Current Every Day Smoker    Packs/day: 1.50    Years: 50.00    Pack years: 75.00  . Smokeless tobacco: Never Used  Substance and Sexual Activity  . Alcohol use: Yes    Alcohol/week: 1.0 standard drinks    Types: 1 Cans of beer per week  . Drug use: No  . Sexual activity: Never  Lifestyle  . Physical activity:    Days per week: Not on file    Minutes per session: Not on file  . Stress: Not on file  Relationships  . Social connections:    Talks on phone: Not on file    Gets together: Not on file    Attends religious service: Not on file    Active member of club or organization: Not on file    Attends meetings of clubs or organizations: Not on file    Relationship status: Not on file  . Intimate partner violence:    Fear of current or ex partner: Not on file    Emotionally abused: Not on file    Physically abused: Not on file    Forced sexual activity: Not on file  Other Topics Concern  . Not on file  Social History Narrative  . Not on file    Review of Systems: See HPI, otherwise negative ROS  Physical Exam: BP 117/87   Pulse 94   Temp 97.7 F (36.5 C) (Temporal)   Resp 16   Ht 5\' 7"  (1.702 m)   Wt 72.6 kg   SpO2 98%   BMI 25.06 kg/m  General:   Alert,  pleasant and cooperative in NAD Head:  Normocephalic and atraumatic. Neck:  Supple; no masses or thyromegaly. Lungs:  Clear throughout to auscultation.    Heart:  Regular rate and rhythm. Abdomen:  Soft, nontender and nondistended. Normal bowel sounds, without guarding, and without rebound.   Neurologic:  Alert and  oriented x4;  grossly normal neurologically.  Impression/Plan: Joel Flores is here for an colonoscopy to be performed for screening colonosocpy  Risks, benefits, limitations, and alternatives regarding  colonoscopy have been reviewed with the patient.  Questions have been answered.  All parties agreeable.   Lucilla Lame, MD  01/17/2018, 7:58 AM

## 2018-01-18 ENCOUNTER — Encounter: Payer: Self-pay | Admitting: Gastroenterology

## 2018-02-02 ENCOUNTER — Ambulatory Visit: Payer: Medicare Other | Admitting: Gastroenterology

## 2018-02-04 ENCOUNTER — Encounter: Payer: Self-pay | Admitting: Gastroenterology

## 2018-02-06 ENCOUNTER — Encounter: Payer: Self-pay | Admitting: Gastroenterology

## 2018-02-09 ENCOUNTER — Encounter: Payer: Self-pay | Admitting: Family Medicine

## 2018-02-09 ENCOUNTER — Ambulatory Visit: Payer: Medicare Other | Admitting: Family Medicine

## 2018-02-09 VITALS — BP 120/80 | HR 60 | Ht 67.0 in | Wt 168.0 lb

## 2018-02-09 DIAGNOSIS — Z23 Encounter for immunization: Secondary | ICD-10-CM | POA: Diagnosis not present

## 2018-02-09 DIAGNOSIS — K219 Gastro-esophageal reflux disease without esophagitis: Secondary | ICD-10-CM

## 2018-02-09 DIAGNOSIS — F3342 Major depressive disorder, recurrent, in full remission: Secondary | ICD-10-CM | POA: Diagnosis not present

## 2018-02-09 DIAGNOSIS — E039 Hypothyroidism, unspecified: Secondary | ICD-10-CM

## 2018-02-09 DIAGNOSIS — I700 Atherosclerosis of aorta: Secondary | ICD-10-CM

## 2018-02-09 MED ORDER — ROSUVASTATIN CALCIUM 20 MG PO TABS: 20.0000 mg | ORAL_TABLET | Freq: Every day | ORAL | 1 refills | Status: DC

## 2018-02-09 MED ORDER — CLOPIDOGREL BISULFATE 75 MG PO TABS: 75.0000 mg | ORAL_TABLET | Freq: Every day | ORAL | 1 refills | Status: DC

## 2018-02-09 MED ORDER — SERTRALINE HCL 100 MG PO TABS: 100.0000 mg | ORAL_TABLET | Freq: Every day | ORAL | 1 refills | Status: DC

## 2018-02-09 MED ORDER — LEVOTHYROXINE SODIUM 100 MCG PO TABS: 100.0000 ug | ORAL_TABLET | Freq: Every day | ORAL | 1 refills | Status: DC

## 2018-02-09 MED ORDER — DEXLANSOPRAZOLE 60 MG PO CPDR: 1.0000 | DELAYED_RELEASE_CAPSULE | Freq: Every day | ORAL | 5 refills | Status: DC

## 2018-02-09 NOTE — Addendum Note (Signed)
Addended by: Berton Mount L on: 02/09/2018 11:01 AM   Modules accepted: Orders

## 2018-02-09 NOTE — Progress Notes (Signed)
Date:  02/09/2018   Name:  Joel Flores   DOB:  15-Jun-1950   MRN:  [id number]   Chief Complaint: Hypothyroidism (needs thyroid checked); Hyperlipidemia; Hypertension; Gastroesophageal Reflux; Anxiety; Depression (PHQ=4, on treatment); and Flu Vaccine Hyperlipidemia  This is a chronic problem. The current episode started more than 1 year ago. The problem is controlled. Recent lipid tests were reviewed and are normal. He has no history of chronic renal disease, diabetes, hypothyroidism, liver disease, obesity or nephrotic syndrome. There are no known factors aggravating his hyperlipidemia. Pertinent negatives include no chest pain, focal sensory loss, focal weakness, leg pain, myalgias or shortness of breath. Current antihyperlipidemic treatment includes statins. The current treatment provides moderate improvement of lipids. There are no compliance problems.  Risk factors for coronary artery disease include dyslipidemia, hypertension and male sex.  Hypertension  This is a chronic problem. The current episode started more than 1 year ago. The problem is unchanged. The problem is controlled. Associated symptoms include anxiety. Pertinent negatives include no blurred vision, chest pain, headaches, malaise/fatigue, neck pain, orthopnea, palpitations, peripheral edema, PND or shortness of breath. Risk factors for coronary artery disease include dyslipidemia, post-menopausal state, male gender and smoking/tobacco exposure. Past treatments include angiotensin blockers. The current treatment provides moderate improvement. There are no compliance problems.  There is no history of angina, kidney disease, CAD/MI, CVA, heart failure, left ventricular hypertrophy, PVD or retinopathy. Identifiable causes of hypertension include a thyroid problem. There is no history of chronic renal disease, a hypertension causing med or renovascular disease.  Gastroesophageal Reflux  He reports no abdominal pain, no belching, no  chest pain, no choking, no coughing, no dysphagia, no early satiety, no globus sensation, no heartburn, no hoarse voice, no nausea, no sore throat, no stridor or no wheezing. This is a recurrent problem. The current episode started more than 1 month ago. The problem has been gradually worsening. The symptoms are aggravated by certain foods. Pertinent negatives include no anemia, fatigue, melena, muscle weakness, orthopnea or weight loss. There are no known risk factors. He has tried a PPI for the symptoms. The treatment provided moderate relief.  Anxiety  Presents for follow-up visit. Symptoms include excessive worry and nervous/anxious behavior. Patient reports no chest pain, compulsions, confusion, decreased concentration, depressed mood, dizziness, dry mouth, feeling of choking, hyperventilation, impotence, insomnia, irritability, malaise, muscle tension, nausea, obsessions, palpitations, panic, restlessness, shortness of breath or suicidal ideas. The severity of symptoms is mild. The quality of sleep is good.    Depression         This is a chronic problem.  The current episode started more than 1 year ago.   The onset quality is gradual.   The problem occurs intermittently.  The problem has been gradually improving since onset.  Associated symptoms include no decreased concentration, no fatigue, no helplessness, no hopelessness, does not have insomnia, not irritable, no restlessness, no decreased interest, no appetite change, no body aches, no myalgias, no headaches, no indigestion, not sad and no suicidal ideas.  Past treatments include SSRIs - Selective serotonin reuptake inhibitors.  Past medical history includes thyroid problem and anxiety.     Pertinent negatives include no hypothyroidism. Thyroid Problem  Presents for follow-up visit. Symptoms include anxiety. Patient reports no cold intolerance, constipation, depressed mood, diaphoresis, diarrhea, dry skin, fatigue, hair loss, heat intolerance,  hoarse voice, leg swelling, menstrual problem, nail problem, palpitations, tremors, visual change, weight gain or weight loss. The symptoms have been stable. His past medical  history is significant for hyperlipidemia. There is no history of diabetes or heart failure.     Review of Systems  Constitutional: Negative for appetite change, chills, diaphoresis, fatigue, fever, irritability, malaise/fatigue, weight gain and weight loss.  HENT: Negative for drooling, ear discharge, ear pain, hoarse voice and sore throat.   Eyes: Negative for blurred vision.  Respiratory: Negative for cough, choking, shortness of breath and wheezing.   Cardiovascular: Negative for chest pain, palpitations, orthopnea, leg swelling and PND.  Gastrointestinal: Negative for abdominal pain, blood in stool, constipation, diarrhea, dysphagia, heartburn, melena and nausea.  Endocrine: Negative for cold intolerance, heat intolerance and polydipsia.  Genitourinary: Negative for dysuria, frequency, hematuria, impotence, menstrual problem and urgency.  Musculoskeletal: Negative for back pain, myalgias, muscle weakness and neck pain.  Skin: Negative for rash.  Allergic/Immunologic: Negative for environmental allergies.  Neurological: Negative for dizziness, tremors, focal weakness and headaches.  Hematological: Does not bruise/bleed easily.  Psychiatric/Behavioral: Positive for depression. Negative for confusion, decreased concentration and suicidal ideas. The patient is nervous/anxious. The patient does not have insomnia.     Patient Active Problem List   Diagnosis Date Noted  . Colon cancer screening   . Polyp of sigmoid colon   . SOBOE (shortness of breath on exertion) 07/30/2017  . PVD (peripheral vascular disease) ([place]) 11/17/2016  . Tobacco use disorder 11/17/2016  . Reactive airway disease, mild intermittent, uncomplicated 42/35/3614  . Allergic rhinitis due to pollen 08/13/2015  . Nocturia 08/13/2015  .  Atherosclerosis of aorta ([place]) 02/12/2015  . Essential hypertension 02/12/2015  . Esophageal reflux 02/12/2015  . Depression 02/12/2015  . Adult hypothyroidism 02/12/2015  . Chronic anxiety 02/12/2015  . Hyperlipemia 02/12/2015    Allergies  Allergen Reactions  . Sulfa Antibiotics Rash    Past Surgical History:  Procedure Laterality Date  . CHOLECYSTECTOMY    . COLONOSCOPY  2015   Dr Allen Norris- cleared for 5 years   . COLONOSCOPY WITH PROPOFOL N/A 01/17/2018   Procedure: COLONOSCOPY WITH PROPOFOL;  Surgeon: Lucilla Lame, MD;  Location: [facility];  Service: Endoscopy;  Laterality: N/A;  requests early  . INSERTION OF MESH N/A 08/31/2017   Procedure: INSERTION OF MESH;  Surgeon: Vickie Epley, MD;  Location: [facility];  Service: General;  Laterality: N/A;  . POLYPECTOMY  01/17/2018   Procedure: POLYPECTOMY;  Surgeon: Lucilla Lame, MD;  Location: [facility];  Service: Endoscopy;;  . UMBILICAL HERNIA REPAIR N/A 08/31/2017   Procedure: HERNIA REPAIR UMBILICAL ADULT;  Surgeon: Vickie Epley, MD;  Location: [facility];  Service: General;  Laterality: N/A;  . VEIN SURGERY     2 stents in R) leg    Social History   Tobacco Use  . Smoking status: Current Every Day Smoker    Packs/day: 1.50    Years: 50.00    Pack years: 75.00  . Smokeless tobacco: Never Used  Substance Use Topics  . Alcohol use: Yes    Alcohol/week: 1.0 standard drinks    Types: 1 Cans of beer per week  . Drug use: No     Medication list has been reviewed and updated.  Current Meds  Medication Sig  . acetaminophen (TYLENOL) 325 MG tablet Take 325 mg by mouth daily as needed for moderate pain or headache.  . cetirizine (ZYRTEC) 10 MG tablet Take 1 tablet (10 mg total) by mouth daily.  . clonazePAM (KLONOPIN) 0.5 MG tablet Take 1 tablet (0.5 mg total) by mouth daily. Take one half bid (Patient  taking differently: Take 0.25 mg by mouth daily. Take one half bid)  . clopidogrel (PLAVIX) 75 MG  tablet Take 1 tablet (75 mg total) by mouth daily.  Marland Kitchen dexlansoprazole (DEXILANT) 60 MG capsule Take 1 capsule (60 mg total) by mouth daily.  . fluticasone (FLONASE) 50 MCG/ACT nasal spray Place 2 sprays into both nostrils daily.  Marland Kitchen ibuprofen (ADVIL,MOTRIN) 100 MG tablet Take 100 mg by mouth every 6 (six) hours as needed for fever.  . levothyroxine (SYNTHROID, LEVOTHROID) 100 MCG tablet Take 1 tablet (100 mcg total) by mouth daily before breakfast.  . losartan (COZAAR) 50 MG tablet Take 1 tablet (50 mg total) by mouth daily.  . rosuvastatin (CRESTOR) 20 MG tablet Take 1 tablet (20 mg total) by mouth daily.  . sertraline (ZOLOFT) 100 MG tablet Take 1 tablet (100 mg total) by mouth daily.  . [DISCONTINUED] clopidogrel (PLAVIX) 75 MG tablet Take 1 tablet (75 mg total) by mouth daily.  . [DISCONTINUED] dexlansoprazole (DEXILANT) 60 MG capsule Take 1 capsule (60 mg total) by mouth daily.  . [DISCONTINUED] levothyroxine (SYNTHROID, LEVOTHROID) 100 MCG tablet Take 1 tablet (100 mcg total) by mouth daily before breakfast.  . [DISCONTINUED] rosuvastatin (CRESTOR) 20 MG tablet Take 20 mg by mouth daily.  . [DISCONTINUED] sertraline (ZOLOFT) 100 MG tablet Take 100 mg by mouth daily.    PHQ 2/9 Scores 02/09/2018 12/20/2017 07/21/2017 02/19/2016  PHQ - 2 Score 2 0 0 2  PHQ- 9 Score 4 0 - 3    Physical Exam  Constitutional: He is oriented to person, place, and time. He is not irritable.  HENT:  Head: Normocephalic.  Right Ear: External ear normal.  Left Ear: External ear normal.  Nose: Nose normal.  Mouth/Throat: Oropharynx is clear and moist.  Eyes: Pupils are equal, round, and reactive to light. Conjunctivae and EOM are normal. Right eye exhibits no discharge. Left eye exhibits no discharge. No scleral icterus.  Neck: Normal range of motion. Neck supple. No JVD present. No tracheal deviation present. No thyromegaly present.  Cardiovascular: Normal rate, regular rhythm, normal heart sounds and intact  distal pulses. Exam reveals no gallop and no friction rub.  No murmur heard. Pulmonary/Chest: Breath sounds normal. No respiratory distress. He has no wheezes. He has no rales.  Abdominal: Soft. Bowel sounds are normal. He exhibits no mass. There is no hepatosplenomegaly. There is no tenderness. There is no rebound, no guarding and no CVA tenderness.  Musculoskeletal: Normal range of motion. He exhibits no edema or tenderness.  Lymphadenopathy:    He has no cervical adenopathy.  Neurological: He is alert and oriented to person, place, and time. He has normal strength and normal reflexes. No cranial nerve deficit.  Skin: Skin is warm. No rash noted.  Nursing note and vitals reviewed.   BP 120/80   Pulse 60   Ht 5\' 7"  (1.702 m)   Wt 168 lb (76.2 kg)   BMI 26.31 kg/m   Assessment and Plan:  1. Atherosclerosis of aorta (HCC) Stable on meds- refill clopidogrel and crestor - clopidogrel (PLAVIX) 75 MG tablet; Take 1 tablet (75 mg total) by mouth daily.  Dispense: 90 tablet; Refill: 1 - rosuvastatin (CRESTOR) 20 MG tablet; Take 1 tablet (20 mg total) by mouth daily.  Dispense: 90 tablet; Refill: 1  2. Gastroesophageal reflux disease, esophagitis presence not specified Stable on med- continue dexilant as prescribed - dexlansoprazole (DEXILANT) 60 MG capsule; Take 1 capsule (60 mg total) by mouth daily.  Dispense: 30 capsule;  Refill: 5  3. Adult hypothyroidism Stable on meds- continue levothyroxine/ draw tsh - levothyroxine (SYNTHROID, LEVOTHROID) 100 MCG tablet; Take 1 tablet (100 mcg total) by mouth daily before breakfast.  Dispense: 90 tablet; Refill: 1 - TSH  4. Recurrent major depressive disorder, in full remission ([place]) Stable on med- continue Sertraline as prescribed - sertraline (ZOLOFT) 100 MG tablet; Take 1 tablet (100 mg total) by mouth daily.  Dispense: 90 tablet; Refill: 1    Dr. [facility]  02/09/2018

## 2018-02-10 LAB — TSH: TSH: 3.02 u[IU]/mL (ref 0.450–4.500)

## 2018-02-19 ENCOUNTER — Other Ambulatory Visit: Payer: Self-pay | Admitting: Family Medicine

## 2018-02-19 DIAGNOSIS — I10 Essential (primary) hypertension: Secondary | ICD-10-CM

## 2018-03-08 ENCOUNTER — Encounter: Payer: Self-pay | Admitting: *Deleted

## 2018-03-09 ENCOUNTER — Other Ambulatory Visit: Payer: Self-pay | Admitting: Family Medicine

## 2018-03-09 DIAGNOSIS — F419 Anxiety disorder, unspecified: Secondary | ICD-10-CM

## 2018-03-09 DIAGNOSIS — F3341 Major depressive disorder, recurrent, in partial remission: Secondary | ICD-10-CM

## 2018-03-16 ENCOUNTER — Ambulatory Visit: Payer: Medicare Other | Admitting: Anesthesiology

## 2018-03-16 ENCOUNTER — Ambulatory Visit
Admission: RE | Admit: 2018-03-16 | Discharge: 2018-03-16 | Disposition: A | Discharge status: Home / Self Care | Payer: Medicare Other | Source: Ambulatory Visit | Attending: Ophthalmology | Admitting: Ophthalmology

## 2018-03-16 ENCOUNTER — Encounter
Admission: RE | Disposition: A | Discharge status: Home / Self Care | Payer: Self-pay | Source: Ambulatory Visit | Attending: Ophthalmology

## 2018-03-16 ENCOUNTER — Other Ambulatory Visit: Payer: Self-pay

## 2018-03-16 DIAGNOSIS — I10 Essential (primary) hypertension: Secondary | ICD-10-CM | POA: Diagnosis not present

## 2018-03-16 DIAGNOSIS — F17200 Nicotine dependence, unspecified, uncomplicated: Secondary | ICD-10-CM | POA: Insufficient documentation

## 2018-03-16 DIAGNOSIS — F419 Anxiety disorder, unspecified: Secondary | ICD-10-CM | POA: Insufficient documentation

## 2018-03-16 DIAGNOSIS — H25041 Posterior subcapsular polar age-related cataract, right eye: Secondary | ICD-10-CM | POA: Insufficient documentation

## 2018-03-16 DIAGNOSIS — K219 Gastro-esophageal reflux disease without esophagitis: Secondary | ICD-10-CM | POA: Insufficient documentation

## 2018-03-16 DIAGNOSIS — I739 Peripheral vascular disease, unspecified: Secondary | ICD-10-CM | POA: Insufficient documentation

## 2018-03-16 DIAGNOSIS — H2511 Age-related nuclear cataract, right eye: Secondary | ICD-10-CM | POA: Diagnosis not present

## 2018-03-16 DIAGNOSIS — E039 Hypothyroidism, unspecified: Secondary | ICD-10-CM | POA: Diagnosis not present

## 2018-03-16 DIAGNOSIS — E7800 Pure hypercholesterolemia, unspecified: Secondary | ICD-10-CM | POA: Insufficient documentation

## 2018-03-16 DIAGNOSIS — Z79899 Other long term (current) drug therapy: Secondary | ICD-10-CM | POA: Diagnosis not present

## 2018-03-16 DIAGNOSIS — J449 Chronic obstructive pulmonary disease, unspecified: Secondary | ICD-10-CM | POA: Insufficient documentation

## 2018-03-16 HISTORY — PX: CATARACT EXTRACTION W/PHACO: SHX586

## 2018-03-16 HISTORY — DX: Chronic obstructive pulmonary disease, unspecified: J44.9

## 2018-03-16 SURGERY — PHACOEMULSIFICATION, CATARACT, WITH IOL INSERTION
Anesthesia: Monitor Anesthesia Care | Site: Eye | Laterality: Right

## 2018-03-16 MED ORDER — LIDOCAINE HCL (PF) 4 % IJ SOLN
INTRAMUSCULAR | Status: AC
  Filled 2018-03-16: qty 5

## 2018-03-16 MED ORDER — ARMC OPHTHALMIC DILATING DROPS
OPHTHALMIC | Status: AC
  Administered 2018-03-16: 1 via OPHTHALMIC
  Filled 2018-03-16: qty 0.5

## 2018-03-16 MED ORDER — TETRACAINE HCL 0.5 % OP SOLN
1.0000 [drp] | OPHTHALMIC | Status: AC | PRN
  Administered 2018-03-16 (×3): 1 [drp] via OPHTHALMIC

## 2018-03-16 MED ORDER — NA HYALUR & NA CHOND-NA HYALUR 0.55-0.5 ML IO KIT
PACK | INTRAOCULAR | Status: AC
  Filled 2018-03-16: qty 1.05

## 2018-03-16 MED ORDER — NA HYALUR & NA CHOND-NA HYALUR 0.55-0.5 ML IO KIT
PACK | INTRAOCULAR | Status: DC | PRN
  Administered 2018-03-16: 1 via OPHTHALMIC

## 2018-03-16 MED ORDER — POVIDONE-IODINE 5 % OP SOLN
OPHTHALMIC | Status: DC | PRN
  Administered 2018-03-16: 1 via OPHTHALMIC

## 2018-03-16 MED ORDER — SODIUM CHLORIDE 0.9 % IV SOLN
INTRAVENOUS | Status: DC
  Administered 2018-03-16: 07:00:00 via INTRAVENOUS

## 2018-03-16 MED ORDER — TETRACAINE HCL 0.5 % OP SOLN
OPHTHALMIC | Status: AC
  Administered 2018-03-16: 1 [drp] via OPHTHALMIC
  Filled 2018-03-16: qty 4

## 2018-03-16 MED ORDER — EPINEPHRINE PF 1 MG/ML IJ SOLN
INTRAOCULAR | Status: DC | PRN
  Administered 2018-03-16: 1 mL via OPHTHALMIC

## 2018-03-16 MED ORDER — MOXIFLOXACIN HCL 0.5 % OP SOLN
OPHTHALMIC | Status: AC
  Filled 2018-03-16: qty 3

## 2018-03-16 MED ORDER — ARMC OPHTHALMIC DILATING DROPS
1.0000 "application " | OPHTHALMIC | Status: AC
  Administered 2018-03-16 (×3): 1 via OPHTHALMIC

## 2018-03-16 MED ORDER — LIDOCAINE HCL (PF) 4 % IJ SOLN
INTRAOCULAR | Status: DC | PRN
  Administered 2018-03-16: 2 mL via OPHTHALMIC

## 2018-03-16 MED ORDER — TRYPAN BLUE 0.06 % OP SOLN
OPHTHALMIC | Status: DC | PRN
  Administered 2018-03-16: .5 mL via INTRAOCULAR

## 2018-03-16 MED ORDER — MIDAZOLAM HCL 2 MG/2ML IJ SOLN
INTRAMUSCULAR | Status: DC | PRN
  Administered 2018-03-16 (×2): 1 mg via INTRAVENOUS

## 2018-03-16 MED ORDER — TRYPAN BLUE 0.06 % OP SOLN
OPHTHALMIC | Status: AC
  Filled 2018-03-16: qty 0.5

## 2018-03-16 MED ORDER — DEXMEDETOMIDINE HCL 200 MCG/2ML IV SOLN
INTRAVENOUS | Status: DC | PRN
  Administered 2018-03-16: 8 ug via INTRAVENOUS

## 2018-03-16 MED ORDER — MIDAZOLAM HCL 2 MG/2ML IJ SOLN
INTRAMUSCULAR | Status: AC
  Filled 2018-03-16: qty 2

## 2018-03-16 MED ORDER — MOXIFLOXACIN HCL 0.5 % OP SOLN: 1.0000 [drp] | OPHTHALMIC | Status: DC | PRN

## 2018-03-16 MED ORDER — EPINEPHRINE PF 1 MG/ML IJ SOLN
INTRAMUSCULAR | Status: AC
  Filled 2018-03-16: qty 1

## 2018-03-16 MED ORDER — POVIDONE-IODINE 5 % OP SOLN
OPHTHALMIC | Status: AC
  Filled 2018-03-16: qty 30

## 2018-03-16 MED ORDER — MOXIFLOXACIN HCL 0.5 % OP SOLN
OPHTHALMIC | Status: DC | PRN
  Administered 2018-03-16: .2 mL via OPHTHALMIC

## 2018-03-16 SURGICAL SUPPLY — 18 items
DISSECTOR HYDRO NUCLEUS 50X22 (MISCELLANEOUS) ×12 IMPLANT
GLOVE BIOGEL M 6.5 STRL (GLOVE) ×3 IMPLANT
GOWN STRL REUS W/ TWL LRG LVL3 (GOWN DISPOSABLE) ×1 IMPLANT
GOWN STRL REUS W/ TWL XL LVL3 (GOWN DISPOSABLE) ×1 IMPLANT
GOWN STRL REUS W/TWL LRG LVL3 (GOWN DISPOSABLE) ×2
GOWN STRL REUS W/TWL XL LVL3 (GOWN DISPOSABLE) ×2
KNIFE 45D UP 2.3 (MISCELLANEOUS) ×3 IMPLANT
LABEL CATARACT MEDS ST (LABEL) ×3 IMPLANT
LENS IOL ACRSF IQ ULTRA 23.0 (Intraocular Lens) IMPLANT
LENS IOL ACRYSOF IQ 23.0 (Intraocular Lens) ×3 IMPLANT
PACK CATARACT (MISCELLANEOUS) ×3 IMPLANT
PACK CATARACT KING (MISCELLANEOUS) ×3 IMPLANT
PACK EYE AFTER SURG (MISCELLANEOUS) ×3 IMPLANT
SOL BSS BAG (MISCELLANEOUS) ×3
SOLUTION BSS BAG (MISCELLANEOUS) ×1 IMPLANT
SYR 5ML LL (SYRINGE) ×3 IMPLANT
WATER STERILE IRR 250ML POUR (IV SOLUTION) ×3 IMPLANT
WIPE NON LINTING 3.25X3.25 (MISCELLANEOUS) ×3 IMPLANT

## 2018-03-16 NOTE — Transfer of Care (Signed)
Immediate Anesthesia Transfer of Care Note  Patient: Joel Flores  Procedure(s) Performed: CATARACT EXTRACTION PHACO AND INTRAOCULAR LENS PLACEMENT (IOC) (Right Eye)  Patient Location: Short Stay  Anesthesia Type:MAC  Level of Consciousness: awake, alert , oriented and patient cooperative  Airway & Oxygen Therapy: Patient Spontanous Breathing  Post-op Assessment: Report given to RN and Post -op Vital signs reviewed and stable  Post vital signs: Reviewed and stable  Last Vitals:  Vitals Value Taken Time  BP    Temp    Pulse    Resp    SpO2      Last Pain:  Vitals:   03/16/18 0715  TempSrc: Temporal         Complications: No apparent anesthesia complications

## 2018-03-16 NOTE — H&P (Signed)
   I have reviewed the patient's H&P and agree with its findings. There have been no interval changes.  Emmerson Taddei MD Ophthalmology 

## 2018-03-16 NOTE — OR Nursing (Signed)
Discharge instructions discussed with pt and wife. Both voice understanding. 

## 2018-03-16 NOTE — Anesthesia Post-op Follow-up Note (Signed)
Anesthesia QCDR form completed.        

## 2018-03-16 NOTE — Discharge Instructions (Addendum)
Eye Surgery Discharge Instructions    Expect mild scratchy sensation or mild soreness. DO NOT RUB YOUR EYE!  The day of surgery:  Minimal physical activity, but bed rest is not required  No reading, computer work, or close hand work  No bending, lifting, or straining.  May watch TV  For 24 hours:  No driving, legal decisions, or alcoholic beverages  Safety precautions  Eat anything you prefer: It is better to start with liquids, then soup then solid foods.  Solar shield eyeglasses should be worn for comfort in the sunlight/patch while sleeping  Resume all regular medications including aspirin or Coumadin if these were discontinued prior to surgery. You may shower, bathe, shave, or wash your hair. Tylenol may be taken for mild discomfort. Follow eye drop instruction sheet as reviewed.  Call your doctor if you experience significant pain, nausea, or vomiting, fever > 101 or other signs of infection. [phone] or [phone] Specific instructions:  Follow-up Information    Marchia Meiers, MD Follow up on 03/17/2018.   Specialty:  Ophthalmology Why:  appointment time @ 1:30 PM Contact information: [place] [address] [phone]

## 2018-03-16 NOTE — Anesthesia Postprocedure Evaluation (Signed)
Anesthesia Post Note  Patient: Joel Flores  Procedure(s) Performed: CATARACT EXTRACTION PHACO AND INTRAOCULAR LENS PLACEMENT (IOC) (Right Eye)  Patient location during evaluation: Short Stay Anesthesia Type: MAC Level of consciousness: awake and alert, oriented and patient cooperative Pain management: satisfactory to patient Vital Signs Assessment: post-procedure vital signs reviewed and stable Respiratory status: spontaneous breathing, respiratory function stable and nonlabored ventilation Cardiovascular status: blood pressure returned to baseline and stable Postop Assessment: no headache and no apparent nausea or vomiting Anesthetic complications: no     Last Vitals:  Vitals:   03/16/18 0715 03/16/18 1000  BP: 118/84 104/63  Pulse: 100 75  Resp: 16 16  Temp: (!) 36 C (!) 36.2 C  SpO2: 98% 97%    Last Pain:  Vitals:   03/16/18 1000  TempSrc:   PainSc: 0-No pain                 Eben Burow

## 2018-03-16 NOTE — Anesthesia Preprocedure Evaluation (Signed)
Anesthesia Evaluation  Patient identified by MRN, date of birth, ID band Patient awake    Reviewed: Allergy & Precautions, NPO status , Patient's Chart, lab work & pertinent test results  History of Anesthesia Complications Negative for: history of anesthetic complications  Airway Mallampati: II  TM Distance: >3 FB Neck ROM: Full    Dental  (+) Poor Dentition   Pulmonary asthma , COPD, Current Smoker,    breath sounds clear to auscultation- rhonchi (-) wheezing      Cardiovascular hypertension, Pt. on medications + Peripheral Vascular Disease  (-) CAD, (-) Past MI, (-) Cardiac Stents and (-) CABG  Rhythm:Regular Rate:Normal - Systolic murmurs and - Diastolic murmurs    Neuro/Psych PSYCHIATRIC DISORDERS Anxiety Depression negative neurological ROS     GI/Hepatic Neg liver ROS, GERD  ,  Endo/Other  neg diabetesHypothyroidism   Renal/GU negative Renal ROS     Musculoskeletal  (+) Arthritis ,   Abdominal (+) - obese,   Peds  Hematology negative hematology ROS (+)   Anesthesia Other Findings Past Medical History: No date: Anxiety     Comment:  PANIC ATTACKS No date: Arthritis     Comment:  right shoulder 02/12/2015: Atherosclerosis of aorta (HCC) No date: COPD (chronic obstructive pulmonary disease) (HCC) No date: Cough No date: Depression No date: Environmental and seasonal allergies 02/12/2015: Essential hypertension No date: Family history of adverse reaction to anesthesia     Comment:  Father - 80 - MI, then stroke after Prostate surgery No date: GERD (gastroesophageal reflux disease) No date: Hyperlipidemia No date: Hypertension No date: Hypothyroidism No date: Incisional hernia, without obstruction or gangrene 11/17/2016: PVD (peripheral vascular disease) ([place]) No date: Thyroid disease No date: Venous insufficiency No date: Vertigo     Comment:  can happen daily No date: Wears hearing aid in both  ears     Comment:  has, does not wear   Reproductive/Obstetrics                             Anesthesia Physical Anesthesia Plan  ASA: III  Anesthesia Plan: MAC   Post-op Pain Management:    Induction: Intravenous  PONV Risk Score and Plan: 1 and Midazolam  Airway Management Planned: Natural Airway  Additional Equipment:   Intra-op Plan:   Post-operative Plan:   Informed Consent: I have reviewed the patients History and Physical, chart, labs and discussed the procedure including the risks, benefits and alternatives for the proposed anesthesia with the patient or authorized representative who has indicated his/her understanding and acceptance.     Plan Discussed with: CRNA and Anesthesiologist  Anesthesia Plan Comments:         Anesthesia Quick Evaluation

## 2018-03-16 NOTE — Op Note (Signed)
PREOPERATIVE DIAGNOSIS:  Nuclear sclerotic and posterior subcapsular cataract of the right eye.   POSTOPERATIVE DIAGNOSIS:  Nuclear sclerotic and posterior subcapsular cataract of the right eye.   OPERATIVE PROCEDURE: Cataract surgery   SURGEON:  Marchia Meiers, MD.   ANESTHESIA:  Anesthesiologist: Emmie Niemann, MD CRNA: Eben Burow, CRNA  1.      Managed anesthesia care. 2.     0.22ml of Shugarcaine was instilled following the paracentesis   COMPLICATIONS:  None.   TECHNIQUE:   Divide and conquer   DESCRIPTION OF PROCEDURE:  The patient was examined and consented in the preoperative holding area where the aforementioned topical anesthesia was applied to the right eye and then brought back to the Operating Room where the right eye was prepped and draped in the usual sterile ophthalmic fashion and a lid speculum was placed. A paracentesis was created with the side port blade, the anterior chamber was washed out with trypan blue to stain the anterior capsule, and the anterior chamber was filled with viscoelastic. A near clear corneal incision was performed with the steel keratome. A continuous curvilinear capsulorrhexis was performed with a cystotome followed by the capsulorrhexis forceps. Hydrodissection and hydrodelineation were carried out with BSS on a blunt cannula. The lens was removed in a divide and conquer  technique and the remaining cortical material was removed with the irrigation-aspiration handpiece. The capsular bag was inflated with viscoelastic and the lens was placed in the capsular bag without complication. The remaining viscoelastic was removed from the eye with the irrigation-aspiration handpiece. The wounds were hydrated. The anterior chamber was flushed and the eye was inflated to physiologic pressure. 0.47ml Vigamox was placed in the anterior chamber. The wounds were found to be water tight. The eye was dressed with Vigamox. The patient was given protective glasses to wear  throughout the day and a shield with which to sleep tonight. The patient was also given drops with which to begin a drop regimen today and will follow-up with me in one day. Implant Name Type Inv. Item Serial No. Manufacturer Lot No. LRB No. Used  LENS IOL ACRYSOF IQ 23.0 - [id number] Intraocular Lens LENS IOL ACRYSOF IQ 23.0 [id number] [employer]  Right 1    Procedure(s) with comments: CATARACT EXTRACTION PHACO AND INTRAOCULAR LENS PLACEMENT (IOC) (Right) - [place]  01:05 CDE 11.85 Fluid pack lot # [id number]  Electronically signed: Eriana Suliman 03/16/2018 10:00 AM

## 2018-03-17 ENCOUNTER — Encounter: Payer: Self-pay | Admitting: Ophthalmology

## 2018-03-28 ENCOUNTER — Other Ambulatory Visit: Payer: Self-pay | Admitting: Family Medicine

## 2018-03-28 DIAGNOSIS — I700 Atherosclerosis of aorta: Secondary | ICD-10-CM

## 2018-05-15 ENCOUNTER — Other Ambulatory Visit: Payer: Self-pay | Admitting: Family Medicine

## 2018-05-15 DIAGNOSIS — E039 Hypothyroidism, unspecified: Secondary | ICD-10-CM

## 2018-05-30 ENCOUNTER — Other Ambulatory Visit: Payer: Self-pay | Admitting: Family Medicine

## 2018-05-30 DIAGNOSIS — I10 Essential (primary) hypertension: Secondary | ICD-10-CM

## 2018-06-01 ENCOUNTER — Ambulatory Visit (INDEPENDENT_AMBULATORY_CARE_PROVIDER_SITE_OTHER): Payer: Medicare Other

## 2018-06-01 ENCOUNTER — Telehealth: Payer: Self-pay | Admitting: *Deleted

## 2018-06-01 VITALS — BP 120/80 | HR 96 | Temp 97.6°F | Resp 16 | Ht 67.0 in | Wt 170.2 lb

## 2018-06-01 DIAGNOSIS — Z122 Encounter for screening for malignant neoplasm of respiratory organs: Secondary | ICD-10-CM

## 2018-06-01 DIAGNOSIS — Z0000 Encounter for general adult medical examination without abnormal findings: Secondary | ICD-10-CM

## 2018-06-01 DIAGNOSIS — Z87891 Personal history of nicotine dependence: Secondary | ICD-10-CM

## 2018-06-01 NOTE — Telephone Encounter (Signed)
Received referral for initial lung cancer screening scan. Contacted patient and obtained smoking history,(current, 75pack year) as well as answering questions related to screening process. Patient denies signs of lung cancer such as weight loss or hemoptysis. Patient denies comorbidity that would prevent curative treatment if lung cancer were found. Patient is scheduled for shared decision making visit and CT scan on 06/16/18 at 130.

## 2018-06-01 NOTE — Patient Instructions (Signed)
[name] , Thank you for taking time to come for your Medicare Wellness Visit. I appreciate your ongoing commitment to your health goals. Please review the following plan we discussed and let me know if I can assist you in the future.   Screening recommendations/referrals: Colonoscopy: done 01/17/18 repeat in 2024 Recommended yearly ophthalmology/optometry visit for glaucoma screening and checkup Recommended yearly dental visit for hygiene and checkup  Vaccinations: Influenza vaccine: done 02/09/18 Pneumococcal vaccine: done 12/19/17 Tdap vaccine: done 11/02/16 Shingles vaccine: Shingrix discussed. Please contact your pharmacy for coverage information.     Advanced directives: Advance directive discussed with you today. I have provided a copy for you to complete at home and have notarized. Once this is complete please bring a copy in to our office so we can scan it into your chart.  Conditions/risks identified: If you wish to quit smoking, help is available. For free tobacco cessation program offerings call the [facility] at 561-433-5818 or Live Well Line at (670)784-9540. You may also visit [email or web address] or email [email or web address] for more information on other programs.   Next appointment: Please follow up in one year for your Medicare Annual Wellness visit.    Preventive Care 59 Years and Older, Male Preventive care refers to lifestyle choices and visits with your health care provider that can promote health and wellness. /What does preventive care include?  A yearly physical exam. This is also called an annual well check.  Dental exams once or twice a year.  Routine eye exams. Ask your health care provider how often you should have your eyes checked.  Personal lifestyle choices, including:  Daily care of your teeth and gums.  Regular physical activity.  Eating a healthy diet.  Avoiding tobacco and drug use.  Limiting alcohol use.  Practicing  safe sex.  Taking low doses of aspirin every day.  Taking vitamin and mineral supplements as recommended by your health care provider. What happens during an annual well check? The services and screenings done by your health care provider during your annual well check will depend on your age, overall health, lifestyle risk factors, and family history of disease. Counseling  Your health care provider may ask you questions about your:  Alcohol use.  Tobacco use.  Drug use.  Emotional well-being.  Home and relationship well-being.  Sexual activity.  Eating habits.  History of falls.  Memory and ability to understand (cognition).  Work and work Statistician. Screening  You may have the following tests or measurements:  Height, weight, and BMI.  Blood pressure.  Lipid and cholesterol levels. These may be checked every 5 years, or more frequently if you are over 62 years old.  Skin check.  Lung cancer screening. You may have this screening every year starting at age 58 if you have a 30-pack-year history of smoking and currently smoke or have quit within the past 15 years.  Fecal occult blood test (FOBT) of the stool. You may have this test every year starting at age 42.  Flexible sigmoidoscopy or colonoscopy. You may have a sigmoidoscopy every 5 years or a colonoscopy every 10 years starting at age 54.  Prostate cancer screening. Recommendations will vary depending on your family history and other risks.  Hepatitis C blood test.  Hepatitis B blood test.  Sexually transmitted disease (STD) testing.  Diabetes screening. This is done by checking your blood sugar (glucose) after you have not eaten for a while (fasting). You may have this done  every 1-3 years.  Abdominal aortic aneurysm (AAA) screening. You may need this if you are a current or former smoker.  Osteoporosis. You may be screened starting at age 26 if you are at high risk. Talk with your health care  provider about your test results, treatment options, and if necessary, the need for more tests. Vaccines  Your health care provider may recommend certain vaccines, such as:  Influenza vaccine. This is recommended every year.  Tetanus, diphtheria, and acellular pertussis (Tdap, Td) vaccine. You may need a Td booster every 10 years.  Zoster vaccine. You may need this after age 38.  Pneumococcal 13-valent conjugate (PCV13) vaccine. One dose is recommended after age 67.  Pneumococcal polysaccharide (PPSV23) vaccine. One dose is recommended after age 38. Talk to your health care provider about which screenings and vaccines you need and how often you need them. This information is not intended to replace advice given to you by your health care provider. Make sure you discuss any questions you have with your health care provider. Document Released: 05/10/2015 Document Revised: 01/01/2016 Document Reviewed: 02/12/2015 Elsevier Interactive Patient Education  2017 [place] Prevention in the Home Falls can cause injuries. They can happen to people of all ages. There are many things you can do to make your home safe and to help prevent falls. What can I do on the outside of my home?  Regularly fix the edges of walkways and driveways and fix any cracks.  Remove anything that might make you trip as you walk through a door, such as a raised step or threshold.  Trim any bushes or trees on the path to your home.  Use bright outdoor lighting.  Clear any walking paths of anything that might make someone trip, such as rocks or tools.  Regularly check to see if handrails are loose or broken. Make sure that both sides of any steps have handrails.  Any raised decks and porches should have guardrails on the edges.  Have any leaves, snow, or ice cleared regularly.  Use sand or salt on walking paths during winter.  Clean up any spills in your garage right away. This includes oil or grease  spills. What can I do in the bathroom?  Use night lights.  Install grab bars by the toilet and in the tub and shower. Do not use towel bars as grab bars.  Use non-skid mats or decals in the tub or shower.  If you need to sit down in the shower, use a plastic, non-slip stool.  Keep the floor dry. Clean up any water that spills on the floor as soon as it happens.  Remove soap buildup in the tub or shower regularly.  Attach bath mats securely with double-sided non-slip rug tape.  Do not have throw rugs and other things on the floor that can make you trip. What can I do in the bedroom?  Use night lights.  Make sure that you have a light by your bed that is easy to reach.  Do not use any sheets or blankets that are too big for your bed. They should not hang down onto the floor.  Have a firm chair that has side arms. You can use this for support while you get dressed.  Do not have throw rugs and other things on the floor that can make you trip. What can I do in the kitchen?  Clean up any spills right away.  Avoid walking on wet floors.  Keep  items that you use a lot in easy-to-reach places.  If you need to reach something above you, use a strong step stool that has a grab bar.  Keep electrical cords out of the way.  Do not use floor polish or wax that makes floors slippery. If you must use wax, use non-skid floor wax.  Do not have throw rugs and other things on the floor that can make you trip. What can I do with my stairs?  Do not leave any items on the stairs.  Make sure that there are handrails on both sides of the stairs and use them. Fix handrails that are broken or loose. Make sure that handrails are as long as the stairways.  Check any carpeting to make sure that it is firmly attached to the stairs. Fix any carpet that is loose or worn.  Avoid having throw rugs at the top or bottom of the stairs. If you do have throw rugs, attach them to the floor with carpet  tape.  Make sure that you have a light switch at the top of the stairs and the bottom of the stairs. If you do not have them, ask someone to add them for you. What else can I do to help prevent falls?  Wear shoes that:  Do not have high heels.  Have rubber bottoms.  Are comfortable and fit you well.  Are closed at the toe. Do not wear sandals.  If you use a stepladder:  Make sure that it is fully opened. Do not climb a closed stepladder.  Make sure that both sides of the stepladder are locked into place.  Ask someone to hold it for you, if possible.  Clearly mark and make sure that you can see:  Any grab bars or handrails.  First and last steps.  Where the edge of each step is.  Use tools that help you move around (mobility aids) if they are needed. These include:  Canes.  Walkers.  Scooters.  Crutches.  Turn on the lights when you go into a dark area. Replace any light bulbs as soon as they burn out.  Set up your furniture so you have a clear path. Avoid moving your furniture around.  If any of your floors are uneven, fix them.  If there are any pets around you, be aware of where they are.  Review your medicines with your doctor. Some medicines can make you feel dizzy. This can increase your chance of falling. Ask your doctor what other things that you can do to help prevent falls. This information is not intended to replace advice given to you by your health care provider. Make sure you discuss any questions you have with your health care provider. Document Released: 02/07/2009 Document Revised: 09/19/2015 Document Reviewed: 05/18/2014 Elsevier Interactive Patient Education  2017 [employer].

## 2018-06-01 NOTE — Progress Notes (Signed)
Subjective:   Joel Flores is a 68 y.o. male who presents for an Initial Medicare Annual Wellness Visit.  Review of Systems   Cardiac Risk Factors include: advanced age (>52men, >77 women);smoking/ tobacco exposure;hypertension;male gender;dyslipidemia    Objective:    Today's Vitals   06/01/18 0841  BP: 120/80  Pulse: 96  Resp: 16  Temp: 97.6 F (36.4 C)  TempSrc: Oral  SpO2: 93%  Weight: 170 lb 3.2 oz (77.2 kg)  Height: 5\' 7"  (1.702 m)   Body mass index is 26.66 kg/m.  Advanced Directives 06/01/2018 03/16/2018 01/17/2018 12/04/2017 08/31/2017 08/24/2017 10/30/2016  Does Patient Have a Medical Advance Directive? No No No No Yes Yes No  Type of Advance Directive - - - - Clinical cytogeneticist of [place];Living will -  Does patient want to make changes to medical advance directive? - - - - No - Patient declined - -  Copy of [place] in Chart? - - - - No - copy requested - -  Would patient like information on creating a medical advance directive? Yes (MAU/Ambulatory/Procedural Areas - Information given) No - Patient declined Yes (MAU/Ambulatory/Procedural Areas - Information given) No - Patient declined - - No - Patient declined    Current Medications (verified) Outpatient Encounter Medications as of 06/01/2018  Medication Sig  . acetaminophen (TYLENOL) 325 MG tablet Take 325 mg by mouth daily as needed for moderate pain or headache.  . cetirizine (ZYRTEC) 10 MG tablet TAKE 1 TABLET BY MOUTH EVERY DAY  . clonazePAM (KLONOPIN) 0.5 MG tablet Take 1 tablet (0.5 mg total) by mouth daily. Take one half bid (Patient taking differently: Take 0.25 mg by mouth daily. )  . dexlansoprazole (DEXILANT) 60 MG capsule Take 1 capsule (60 mg total) by mouth daily.  Marland Kitchen ibuprofen (ADVIL,MOTRIN) 200 MG tablet Take 400 mg by mouth every 8 (eight) hours as needed (for pain).  Marland Kitchen levothyroxine (SYNTHROID, LEVOTHROID) 100 MCG tablet TAKE 1 TABLET BY MOUTH DAILY  BEFORE BREAKFAST.  Marland Kitchen losartan (COZAAR) 25 MG tablet Take 2 tablets (50 mg total) by mouth daily.  . rosuvastatin (CRESTOR) 20 MG tablet Take 1 tablet (20 mg total) by mouth daily.  . sertraline (ZOLOFT) 100 MG tablet Take 1 tablet (100 mg total) by mouth daily.  . clopidogrel (PLAVIX) 75 MG tablet Take 1 tablet (75 mg total) by mouth daily.  . fluticasone (FLONASE) 50 MCG/ACT nasal spray Place 2 sprays into both nostrils daily. (Patient taking differently: Place 2 sprays into both nostrils daily as needed for allergies. )  . [DISCONTINUED] clopidogrel (PLAVIX) 75 MG tablet TAKE 1 TABLET BY MOUTH EVERY DAY   No facility-administered encounter medications on file as of 06/01/2018.     Allergies (verified) Sulfa antibiotics   History: Past Medical History:  Diagnosis Date  . Anxiety    PANIC ATTACKS  . Arthritis    right shoulder  . Atherosclerosis of aorta ([place]) 02/12/2015  . COPD (chronic obstructive pulmonary disease) ([place])   . Cough   . Depression   . Environmental and seasonal allergies   . Essential hypertension 02/12/2015  . Family history of adverse reaction to anesthesia    Father - 37 - MI, then stroke after Prostate surgery  . GERD (gastroesophageal reflux disease)   . Hyperlipidemia   . Hypertension   . Hypothyroidism   . Incisional hernia, without obstruction or gangrene   . PVD (peripheral vascular disease) ([place]) 11/17/2016  . Thyroid disease   .  Venous insufficiency   . Vertigo    can happen daily  . Wears hearing aid in both ears    has, does not wear   Past Surgical History:  Procedure Laterality Date  . CATARACT EXTRACTION W/PHACO Right 03/16/2018   Procedure: CATARACT EXTRACTION PHACO AND INTRAOCULAR LENS PLACEMENT (IOC);  Surgeon: Marchia Meiers, MD;  Location: [facility];  Service: Ophthalmology;  Laterality: Right;  [place]  01:05 CDE 11.85 Fluid pack lot # [id number]  . CHOLECYSTECTOMY    . COLONOSCOPY  2015   Dr Allen Norris- cleared for 5 years   . COLONOSCOPY  WITH PROPOFOL N/A 01/17/2018   Procedure: COLONOSCOPY WITH PROPOFOL;  Surgeon: Lucilla Lame, MD;  Location: [place];  Service: Endoscopy;  Laterality: N/A;  requests early  . INSERTION OF MESH N/A 08/31/2017   Procedure: INSERTION OF MESH;  Surgeon: Vickie Epley, MD;  Location: [facility];  Service: General;  Laterality: N/A;  . POLYPECTOMY  01/17/2018   Procedure: POLYPECTOMY;  Surgeon: Lucilla Lame, MD;  Location: [place];  Service: Endoscopy;;  . UMBILICAL HERNIA REPAIR N/A 08/31/2017   Procedure: HERNIA REPAIR UMBILICAL ADULT;  Surgeon: Vickie Epley, MD;  Location: [facility];  Service: General;  Laterality: N/A;  . VEIN SURGERY     2 stents in R) leg   Family History  Problem Relation Age of Onset  . Diabetes Mother   . Cancer Father   . Heart disease Father   . Stroke Father    Social History   Socioeconomic History  . Marital status: Married    Spouse name: Not on file  . Number of children: 3  . Years of education: Not on file  . Highest education level: 12th grade  Occupational History  . Occupation: retired  Scientific laboratory technician  . Financial resource strain: Not hard at all  . Food insecurity:    Worry: Never true    Inability: Never true  . Transportation needs:    Medical: No    Non-medical: No  Tobacco Use  . Smoking status: Current Every Day Smoker    Packs/day: 1.50    Years: 50.00    Pack years: 75.00  . Smokeless tobacco: Never Used  . Tobacco comment: pt wants to try patches again but states they are too costly  Substance and Sexual Activity  . Alcohol use: Yes    Alcohol/week: 1.0 standard drinks    Types: 1 Cans of beer per week  . Drug use: No  . Sexual activity: Not Currently  Lifestyle  . Physical activity:    Days per week: 0 days    Minutes per session: 0 min  . Stress: Not at all  Relationships  . Social connections:    Talks on phone: Three times a week    Gets together: Once a week    Attends religious service:  Patient refused    Active member of club or organization: Patient refused    Attends meetings of clubs or organizations: Patient refused    Relationship status: Patient refused  Other Topics Concern  . Not on file  Social History Narrative  . Not on file   Tobacco Counseling Ready to quit: Yes Counseling given: Yes Comment: pt wants to try patches again but states they are too costly   Clinical Intake:  Pre-visit preparation completed: Yes  Pain : No/denies pain     BMI - recorded: 26.66 Nutritional Status: BMI 25 -29 Overweight Nutritional Risks: None Diabetes: No  How often do you need to have someone help you when you read instructions, pamphlets, or other written materials from your doctor or pharmacy?: 1 - Never What is the last grade level you completed in school?: 12th grade   Interpreter Needed?: No  Information entered by :: Clemetine Marker LPN  Activities of Daily Living In your present state of health, do you have any difficulty performing the following activities: 06/01/2018 01/17/2018  Hearing? Y N  Comment wears hearing aids -  Vision? N N  Comment wears glasses -  Difficulty concentrating or making decisions? N N  Walking or climbing stairs? N N  Dressing or bathing? N N  Doing errands, shopping? N -  Preparing Food and eating ? N -  Using the Toilet? N -  In the past six months, have you accidently leaked urine? N -  Do you have problems with loss of bowel control? N -  Managing your Medications? N -  Managing your Finances? N -  Housekeeping or managing your Housekeeping? N -  Some recent data might be hidden     Immunizations and Health Maintenance Immunization History  Administered Date(s) Administered  . Influenza, High Dose Seasonal PF 01/29/2017, 02/09/2018  . Pneumococcal Conjugate-13 11/02/2016  . Pneumococcal Polysaccharide-23 12/20/2017  . Tdap 11/02/2016   There are no preventive care reminders to display for this patient.  Patient  Care Team: Juline Patch, MD as PCP - General (Family Medicine)  Indicate any recent Medical Services you may have received from other than [facility] providers in the past year (date may be approximate).    Assessment:   This is a routine wellness examination for [employer].  Hearing/Vision screen Hearing Screening Comments: Hearing aids maintained by [facility] Vision Screening Comments: Annual vision screenings done [facility]  Dietary issues and exercise activities discussed: Current Exercise Habits: The patient does not participate in regular exercise at present, Exercise limited by: cardiac condition(s)  Goals    . DIET - REDUCE SODIUM INTAKE     Reduce sodium intake by avoiding adding salt to food.       Depression Screen PHQ 2/9 Scores 06/01/2018 02/09/2018 12/20/2017 07/21/2017  PHQ - 2 Score 0 2 0 0  PHQ- 9 Score - 4 0 -    Fall Risk Fall Risk  06/01/2018 07/21/2017 02/19/2016 01/16/2015  Falls in the past year? 0 No No No  Number falls in past yr: 0 - - -  Injury with Fall? 0 - - -  Follow up Falls prevention discussed - - -   FALL RISK PREVENTION PERTAINING TO THE HOME:  Any stairs in or around the home WITH handrails? Yes  Home free of loose throw rugs in walkways, pet beds, electrical cords, etc? Yes  Adequate lighting in your home to reduce risk of falls? Yes   ASSISTIVE DEVICES UTILIZED TO PREVENT FALLS:  Life alert? No  Use of a cane, walker or w/c? No  Grab bars in the bathroom? No  Shower chair or bench in shower? No  Elevated toilet seat or a handicapped toilet? Yes   DME ORDERS:  DME order needed?  No   TIMED UP AND GO:  Was the test performed? Yes .  Length of time to ambulate 10 feet: 5 sec.   GAIT:  Appearance of gait: Gait stead-fast and without the use of an assistive device.  Education: Fall risk prevention has been discussed.  Intervention(s) required? No   Cognitive Function:  6CIT Screen 06/01/2018  What Year? 0 points  What  month? 0 points  What time? 0 points  Count back from 20 0 points  Months in reverse 2 points  Repeat phrase 4 points  Total Score 6    Screening Tests Health Maintenance  Topic Date Due  . COLONOSCOPY  01/18/2023  . TETANUS/TDAP  11/03/2026  . INFLUENZA VACCINE  Completed  . Hepatitis C Screening  Completed  . PNA vac Low Risk Adult  Completed    Qualifies for Shingles Vaccine? Yes Due for Shingrix. Education has been provided regarding the importance of this vaccine. Pt has been advised to call insurance company to determine out of pocket expense. Advised may also receive vaccine at local pharmacy or Health Dept. Verbalized acceptance and understanding.  Tdap: Up to date   Flu Vaccine: Up to date  Pneumococcal Vaccine: Up to date   Cancer Screenings:  Colorectal Screening: Completed 01/17/18. Repeat every 5 years.   Lung Cancer Screening: (Low Dose CT Chest recommended if Age 44-80 years, 30 pack-year currently smoking OR have quit w/in 15years.) DOES qualify.   Lung Cancer Screening Referral: An Epic message has been sent to Burgess Estelle, RN (Oncology Nurse Navigator) regarding the possible need for this exam. Raquel Sarna will review the patient's chart to determine if the patient truly qualifies for the exam. If the patient qualifies, Raquel Sarna will order the Low Dose CT of the chest to facilitate the scheduling of this exam.  Additional Screening:  Hepatitis C Screening: does qualify; Completed 07/27/17  Vision Screening: Recommended annual ophthalmology exams for early detection of glaucoma and other disorders of the eye. Is the patient up to date with their annual eye exam?  Yes  Who is the provider or what is the name of the office in which the pt attends annual eye exams? [place] Screening: Recommended annual dental exams for proper oral hygiene  Community Resource Referral:  CRR required this visit?  No       Plan:    I have personally reviewed  and addressed the Medicare Annual Wellness questionnaire and have noted the following in the patient's chart:  A. Medical and social history B. Use of alcohol, tobacco or illicit drugs  C. Current medications and supplements D. Functional ability and status E.  Nutritional status F.  Physical activity G. Advance directives H. List of other physicians I.  Hospitalizations, surgeries, and ER visits in previous 12 months J.  [place] such as hearing and vision if needed, cognitive and depression L. Referrals and appointments   In addition, I have reviewed and discussed with patient certain preventive protocols, quality metrics, and best practice recommendations. A written personalized care plan for preventive services as well as general preventive health recommendations were provided to patient.   Signed,  Clemetine Marker, LPN Nurse Health Advisor   Nurse Notes: none

## 2018-06-06 ENCOUNTER — Ambulatory Visit: Payer: Medicare Other

## 2018-06-13 ENCOUNTER — Ambulatory Visit: Payer: Medicare Other | Admitting: Family Medicine

## 2018-06-15 ENCOUNTER — Telehealth: Payer: Self-pay | Admitting: *Deleted

## 2018-06-15 NOTE — Telephone Encounter (Signed)
Called pt to remind him of his appt for ldct screening on 06-16-2018@1330 , voiced understanding.

## 2018-06-16 ENCOUNTER — Inpatient Hospital Stay: Payer: Medicare Other | Attending: Nurse Practitioner | Admitting: Nurse Practitioner

## 2018-06-16 ENCOUNTER — Ambulatory Visit
Admission: RE | Admit: 2018-06-16 | Discharge: 2018-06-16 | Disposition: A | Discharge status: Home / Self Care | Payer: Medicare Other | Source: Ambulatory Visit | Attending: Nurse Practitioner | Admitting: Nurse Practitioner

## 2018-06-16 DIAGNOSIS — Z87891 Personal history of nicotine dependence: Secondary | ICD-10-CM

## 2018-06-16 DIAGNOSIS — Z122 Encounter for screening for malignant neoplasm of respiratory organs: Secondary | ICD-10-CM | POA: Diagnosis not present

## 2018-06-16 NOTE — Progress Notes (Signed)
In accordance with CMS guidelines, patient has met eligibility criteria including age, absence of signs or symptoms of lung cancer.  Social History   Tobacco Use  . Smoking status: Current Every Day Smoker    Packs/day: 1.50    Years: 50.00    Pack years: 75.00  . Smokeless tobacco: Never Used  . Tobacco comment: pt wants to try patches again but states they are too costly  Substance Use Topics  . Alcohol use: Yes    Alcohol/week: 1.0 standard drinks    Types: 1 Cans of beer per week  . Drug use: No      A shared decision-making session was conducted prior to the performance of CT scan. This includes one or more decision aids, includes benefits and harms of screening, follow-up diagnostic testing, over-diagnosis, false positive rate, and total radiation exposure.   Counseling on the importance of adherence to annual lung cancer LDCT screening, impact of co-morbidities, and ability or willingness to undergo diagnosis and treatment is imperative for compliance of the program.   Counseling on the importance of continued smoking cessation for former smokers; the importance of smoking cessation for current smokers, and information about tobacco cessation interventions have been given to patient including [place] and [facility] quit [place] programs.   Written order for lung cancer screening with LDCT has been given to the patient and any and all questions have been answered to the best of my abilities.    Yearly follow up will be coordinated by Burgess Estelle, Thoracic Navigator.  Beckey Rutter, DNP, AGNP-C [place] [facility] [phone] (work cell) [phone] (office) 06/16/18 2:25 PM

## 2018-06-17 ENCOUNTER — Encounter: Payer: Self-pay | Admitting: Family Medicine

## 2018-06-17 ENCOUNTER — Ambulatory Visit: Payer: Medicare Other | Admitting: Family Medicine

## 2018-06-17 ENCOUNTER — Telehealth: Payer: Self-pay | Admitting: *Deleted

## 2018-06-17 VITALS — BP 122/64 | HR 64 | Ht 67.0 in | Wt 170.0 lb

## 2018-06-17 DIAGNOSIS — I700 Atherosclerosis of aorta: Secondary | ICD-10-CM

## 2018-06-17 DIAGNOSIS — K219 Gastro-esophageal reflux disease without esophagitis: Secondary | ICD-10-CM

## 2018-06-17 DIAGNOSIS — F3342 Major depressive disorder, recurrent, in full remission: Secondary | ICD-10-CM

## 2018-06-17 DIAGNOSIS — I10 Essential (primary) hypertension: Secondary | ICD-10-CM | POA: Diagnosis not present

## 2018-06-17 DIAGNOSIS — E039 Hypothyroidism, unspecified: Secondary | ICD-10-CM | POA: Diagnosis not present

## 2018-06-17 DIAGNOSIS — E785 Hyperlipidemia, unspecified: Secondary | ICD-10-CM

## 2018-06-17 DIAGNOSIS — I2510 Atherosclerotic heart disease of native coronary artery without angina pectoris: Secondary | ICD-10-CM

## 2018-06-17 MED ORDER — LOSARTAN POTASSIUM 25 MG PO TABS: 50.0000 mg | ORAL_TABLET | Freq: Every day | ORAL | 1 refills | Status: DC

## 2018-06-17 MED ORDER — SERTRALINE HCL 100 MG PO TABS: 100.0000 mg | ORAL_TABLET | Freq: Every day | ORAL | 1 refills | Status: DC

## 2018-06-17 MED ORDER — ROSUVASTATIN CALCIUM 20 MG PO TABS: 20.0000 mg | ORAL_TABLET | Freq: Every day | ORAL | 1 refills | Status: DC

## 2018-06-17 MED ORDER — CLOPIDOGREL BISULFATE 75 MG PO TABS: 75.0000 mg | ORAL_TABLET | Freq: Every day | ORAL | 1 refills | Status: DC

## 2018-06-17 MED ORDER — LEVOTHYROXINE SODIUM 100 MCG PO TABS: 100.0000 ug | ORAL_TABLET | Freq: Every day | ORAL | 1 refills | Status: DC

## 2018-06-17 MED ORDER — DEXLANSOPRAZOLE 60 MG PO CPDR: 1.0000 | DELAYED_RELEASE_CAPSULE | Freq: Every day | ORAL | 1 refills | Status: DC

## 2018-06-17 NOTE — Telephone Encounter (Signed)
Notified patient of LDCT lung cancer screening program results with recommendation for 12 month follow up imaging. Also notified of incidental findings noted below and is encouraged to discuss further with PCP who will receive a copy of this note and/or the CT report. Patient verbalizes understanding.   IMPRESSION: 1. Lung-RADS 2, benign appearance or behavior. Continue annual screening with low-dose chest CT without contrast in 12 months. 2. Two-vessel coronary atherosclerosis.  Aortic Atherosclerosis (ICD10-I70.0) and Emphysema (ICD10-J43.9).  

## 2018-06-17 NOTE — Progress Notes (Signed)
Date:  06/17/2018   Name:  Joel Flores   DOB:  Dec 11, 1950   MRN:  [id number]   Chief Complaint: Depression; Gastroesophageal Reflux; Hypertension; Hypothyroidism; and Anxiety  Depression         This is a chronic problem.  The current episode started more than 1 year ago.   The onset quality is gradual.   The problem occurs intermittently.  The problem has been gradually worsening since onset.  Associated symptoms include no decreased concentration, no fatigue, no helplessness, no hopelessness, does not have insomnia, not irritable, no restlessness, no decreased interest, no appetite change, no body aches, no myalgias, no headaches, no indigestion, not sad and no suicidal ideas.( Baseline [place])     The symptoms are aggravated by medication.  Past treatments include SSRIs - Selective serotonin reuptake inhibitors.  Compliance with treatment is good.  Past compliance problems include difficulty with treatment plan.  Previous treatment provided mild relief.  Past medical history includes thyroid problem and anxiety.   Gastroesophageal Reflux  He reports no abdominal pain, no belching, no chest pain, no choking, no coughing, no dysphagia, no early satiety, no globus sensation, no heartburn, no hoarse voice, no nausea, no sore throat, no stridor, no tooth decay, no water brash or no wheezing. This is a chronic problem. The problem has been unchanged. The symptoms are aggravated by certain foods. Pertinent negatives include no anemia, fatigue, melena, muscle weakness, orthopnea or weight loss. There are no known risk factors. He has tried a PPI for the symptoms. The treatment provided moderate relief.  Hypertension  This is a chronic problem. The current episode started more than 1 year ago. The problem is controlled. Associated symptoms include anxiety. Pertinent negatives include no blurred vision, chest pain, headaches, malaise/fatigue, neck pain, orthopnea, palpitations, peripheral edema, PND,  shortness of breath or sweats. There are no associated agents to hypertension. Risk factors for coronary artery disease include dyslipidemia. Past treatments include angiotensin blockers. The current treatment provides moderate improvement. There are no compliance problems.  There is no history of angina, kidney disease, CAD/MI, CVA, heart failure, left ventricular hypertrophy, PVD or retinopathy. Identifiable causes of hypertension include a thyroid problem. There is no history of chronic renal disease, a hypertension causing med or renovascular disease.  Anxiety  Presents for follow-up visit. Symptoms include excessive worry and nervous/anxious behavior. Patient reports no chest pain, compulsions, confusion, decreased concentration, depressed mood, dizziness, dry mouth, feeling of choking, hyperventilation, impotence, insomnia, irritability, malaise, muscle tension, nausea, obsessions, palpitations, panic, restlessness, shortness of breath or suicidal ideas. The severity of symptoms is mild.    Thyroid Problem  Presents for follow-up visit. Symptoms include anxiety and weight gain. Patient reports no cold intolerance, constipation, depressed mood, diaphoresis, diarrhea, dry skin, fatigue, hair loss, heat intolerance, hoarse voice, leg swelling, nail problem, palpitations, visual change or weight loss. The symptoms have been stable. There is no history of heart failure.    Review of Systems  Constitutional: Positive for weight gain. Negative for appetite change, chills, diaphoresis, fatigue, fever, irritability, malaise/fatigue and weight loss.  HENT: Negative for drooling, ear discharge, ear pain, hoarse voice, postnasal drip, rhinorrhea and sore throat.   Eyes: Negative for blurred vision and pain.  Respiratory: Negative for cough, choking, shortness of breath and wheezing.   Cardiovascular: Negative for chest pain, palpitations, orthopnea, leg swelling and PND.  Gastrointestinal: Negative for  abdominal pain, blood in stool, constipation, diarrhea, dysphagia, heartburn, melena and nausea.  Endocrine: Negative for  cold intolerance, heat intolerance and polydipsia.  Genitourinary: Negative for dysuria, frequency, hematuria, impotence and urgency.  Musculoskeletal: Negative for back pain, myalgias, muscle weakness and neck pain.  Skin: Negative for rash.  Allergic/Immunologic: Negative for environmental allergies.  Neurological: Negative for dizziness and headaches.  Hematological: Does not bruise/bleed easily.  Psychiatric/Behavioral: Positive for depression. Negative for confusion, decreased concentration and suicidal ideas. The patient is nervous/anxious. The patient does not have insomnia.     Patient Active Problem List   Diagnosis Date Noted  . Colon cancer screening   . Polyp of sigmoid colon   . SOBOE (shortness of breath on exertion) 07/30/2017  . PVD (peripheral vascular disease) ([place]) 11/17/2016  . Tobacco use disorder 11/17/2016  . Reactive airway disease, mild intermittent, uncomplicated 94/70/9628  . Allergic rhinitis due to pollen 08/13/2015  . Nocturia 08/13/2015  . Atherosclerosis of aorta ([place]) 02/12/2015  . Essential hypertension 02/12/2015  . Esophageal reflux 02/12/2015  . Depression 02/12/2015  . Adult hypothyroidism 02/12/2015  . Chronic anxiety 02/12/2015  . Hyperlipemia 02/12/2015    Allergies  Allergen Reactions  . Sulfa Antibiotics Rash    Past Surgical History:  Procedure Laterality Date  . CATARACT EXTRACTION W/PHACO Right 03/16/2018   Procedure: CATARACT EXTRACTION PHACO AND INTRAOCULAR LENS PLACEMENT (IOC);  Surgeon: Marchia Meiers, MD;  Location: [facility];  Service: Ophthalmology;  Laterality: Right;  [place]  01:05 CDE 11.85 Fluid pack lot # [id number]  . CHOLECYSTECTOMY    . COLONOSCOPY  2015   Dr Allen Norris- cleared for 5 years   . COLONOSCOPY WITH PROPOFOL N/A 01/17/2018   Procedure: COLONOSCOPY WITH PROPOFOL;  Surgeon: Lucilla Lame, MD;   Location: [place];  Service: Endoscopy;  Laterality: N/A;  requests early  . INSERTION OF MESH N/A 08/31/2017   Procedure: INSERTION OF MESH;  Surgeon: Vickie Epley, MD;  Location: [facility];  Service: General;  Laterality: N/A;  . POLYPECTOMY  01/17/2018   Procedure: POLYPECTOMY;  Surgeon: Lucilla Lame, MD;  Location: [place];  Service: Endoscopy;;  . UMBILICAL HERNIA REPAIR N/A 08/31/2017   Procedure: HERNIA REPAIR UMBILICAL ADULT;  Surgeon: Vickie Epley, MD;  Location: [facility];  Service: General;  Laterality: N/A;  . VEIN SURGERY     2 stents in R) leg    Social History   Tobacco Use  . Smoking status: Current Every Day Smoker    Packs/day: 1.50    Years: 50.00    Pack years: 75.00  . Smokeless tobacco: Never Used  . Tobacco comment: pt wants to try patches again but states they are too costly  Substance Use Topics  . Alcohol use: Yes    Alcohol/week: 1.0 standard drinks    Types: 1 Cans of beer per week  . Drug use: No     Medication list has been reviewed and updated.  Current Meds  Medication Sig  . acetaminophen (TYLENOL) 325 MG tablet Take 325 mg by mouth daily as needed for moderate pain or headache.  . cetirizine (ZYRTEC) 10 MG tablet TAKE 1 TABLET BY MOUTH EVERY DAY  . clonazePAM (KLONOPIN) 0.5 MG tablet Take 1 tablet (0.5 mg total) by mouth daily. Take one half bid (Patient taking differently: Take 0.25 mg by mouth daily. )  . clopidogrel (PLAVIX) 75 MG tablet Take 1 tablet (75 mg total) by mouth daily.  Marland Kitchen dexlansoprazole (DEXILANT) 60 MG capsule Take 1 capsule (60 mg total) by mouth daily.  . fluticasone (FLONASE) 50 MCG/ACT nasal spray Place  2 sprays into both nostrils daily. (Patient taking differently: Place 2 sprays into both nostrils daily as needed for allergies. )  . ibuprofen (ADVIL,MOTRIN) 200 MG tablet Take 400 mg by mouth every 8 (eight) hours as needed (for pain).  Marland Kitchen levothyroxine (SYNTHROID, LEVOTHROID) 100 MCG tablet TAKE  1 TABLET BY MOUTH DAILY BEFORE BREAKFAST.  Marland Kitchen losartan (COZAAR) 25 MG tablet Take 2 tablets (50 mg total) by mouth daily.  . rosuvastatin (CRESTOR) 20 MG tablet Take 1 tablet (20 mg total) by mouth daily.  . sertraline (ZOLOFT) 100 MG tablet Take 1 tablet (100 mg total) by mouth daily.    PHQ 2/9 Scores 06/17/2018 06/01/2018 02/09/2018 12/20/2017  PHQ - 2 Score 0 0 2 0  PHQ- 9 Score 0 - 4 0    Physical Exam Vitals signs and nursing note reviewed.  Constitutional:      General: He is not irritable. HENT:     Head: Normocephalic.     Right Ear: Tympanic membrane, ear canal and external ear normal.     Left Ear: Tympanic membrane, ear canal and external ear normal.     Nose: Nose normal.  Eyes:     General: No scleral icterus.       Right eye: No discharge.        Left eye: No discharge.     Conjunctiva/sclera: Conjunctivae normal.     Pupils: Pupils are equal, round, and reactive to light.  Neck:     Musculoskeletal: Normal range of motion and neck supple.     Thyroid: No thyromegaly.     Vascular: No JVD.     Trachea: No tracheal deviation.  Cardiovascular:     Rate and Rhythm: Normal rate and regular rhythm.     Heart sounds: Normal heart sounds. No murmur. No friction rub. No gallop.   Pulmonary:     Effort: No respiratory distress.     Breath sounds: Normal breath sounds. No wheezing or rales.  Abdominal:     General: Bowel sounds are normal.     Palpations: Abdomen is soft. There is no mass.     Tenderness: There is no abdominal tenderness. There is no guarding or rebound.  Musculoskeletal: Normal range of motion.        General: No tenderness.  Lymphadenopathy:     Cervical: No cervical adenopathy.  Skin:    General: Skin is warm.     Findings: No rash.  Neurological:     Mental Status: He is alert and oriented to person, place, and time.     Cranial Nerves: No cranial nerve deficit.     Deep Tendon Reflexes: Reflexes are normal and symmetric.     BP 122/64    Pulse 64   Ht 5\' 7"  (1.702 m)   Wt 170 lb (77.1 kg)   BMI 26.63 kg/m   Assessment and Plan:  1. Gastroesophageal reflux disease, esophagitis presence not specified Patient with GERD which is controlled on Dexilant with full control only with previous PPIs.  Will continue Dexilant 60 mg once a day - dexlansoprazole (DEXILANT) 60 MG capsule; Take 1 capsule (60 mg total) by mouth daily.  Dispense: 90 capsule; Refill: 1  2. Adult hypothyroidism Patient has an atrophy of his thyroid which requires supplementation with thyroid we will check a TSH in the meantime continue Synthroid at 100 mcg a day. - levothyroxine (SYNTHROID, LEVOTHROID) 100 MCG tablet; Take 1 tablet (100 mcg total) by mouth daily before breakfast.  Dispense:  90 tablet; Refill: 1 - TSH  3. Essential hypertension Chronic.  Controlled.  Will continue losartan 25 mg 2 tablets daily. - losartan (COZAAR) 25 MG tablet; Take 2 tablets (50 mg total) by mouth daily.  Dispense: 90 tablet; Refill: 1 - Renal Function Panel  4. Atherosclerosis of aorta (HCC) And has a history of atherosclerosis of the aorta in addition to calcification of the coronary arteries will treat with Plavix 75 mg daily and treat his his dyslipidemia with Crestor 20 mg keeping it below 80. - rosuvastatin (CRESTOR) 20 MG tablet; Take 1 tablet (20 mg total) by mouth daily.  Dispense: 90 tablet; Refill: 1 - clopidogrel (PLAVIX) 75 MG tablet; Take 1 tablet (75 mg total) by mouth daily.  Dispense: 90 tablet; Refill: 1 - Lipid panel  5. Recurrent major depressive disorder, in full remission ([place]) Patient with recurrent depression and anxiety will continue Zoloft 100 mg daily - sertraline (ZOLOFT) 100 MG tablet; Take 1 tablet (100 mg total) by mouth daily.  Dispense: 90 tablet; Refill: 1  6. Dyslipidemia And has a low HDL with an occasional elevated LDL would like to keep LDL below 80-75 will continue on Crestor. - Lipid panel  7. Coronary artery calcification seen  on CAT scan Seen calcifications of 2 coronary arteries per CT scan she was encouraged to stop smoking, take his lipid agents, and avoid sodium to keep his blood pressure reduced.

## 2018-06-18 LAB — RENAL FUNCTION PANEL
Albumin: 4.4 g/dL (ref 3.8–4.8)
BUN/Creatinine Ratio: 19 (ref 10–24)
BUN: 15 mg/dL (ref 8–27)
CO2: 21 mmol/L (ref 20–29)
Calcium: 9.5 mg/dL (ref 8.6–10.2)
Chloride: 102 mmol/L (ref 96–106)
Creatinine, Ser: 0.81 mg/dL (ref 0.76–1.27)
GFR calc non Af Amer: 91 mL/min/{1.73_m2} (ref >59)
GFR, EST AFRICAN AMERICAN: 106 mL/min/{1.73_m2} (ref >59)
Glucose: 110 mg/dL — ABNORMAL HIGH (ref 65–99)
Phosphorus: 3.5 mg/dL (ref 2.8–4.1)
Potassium: 4.4 mmol/L (ref 3.5–5.2)
Sodium: 140 mmol/L (ref 134–144)

## 2018-06-18 LAB — LIPID PANEL
Chol/HDL Ratio: 3.7 ratio (ref 0.0–5.0)
Cholesterol, Total: 122 mg/dL (ref 100–199)
HDL: 33 mg/dL — ABNORMAL LOW (ref >39)
LDL Calculated: 49 mg/dL (ref 0–99)
Triglycerides: 200 mg/dL — ABNORMAL HIGH (ref 0–149)
VLDL Cholesterol Cal: 40 mg/dL (ref 5–40)

## 2018-06-18 LAB — TSH: TSH: 3.04 u[IU]/mL (ref 0.450–4.500)

## 2018-06-20 ENCOUNTER — Other Ambulatory Visit: Payer: Self-pay | Admitting: Family Medicine

## 2018-06-21 ENCOUNTER — Encounter: Payer: Self-pay | Admitting: *Deleted

## 2018-06-29 ENCOUNTER — Other Ambulatory Visit: Payer: Self-pay

## 2018-06-29 ENCOUNTER — Telehealth: Payer: Self-pay

## 2018-06-29 DIAGNOSIS — F419 Anxiety disorder, unspecified: Secondary | ICD-10-CM

## 2018-06-29 MED ORDER — CLONAZEPAM 0.5 MG PO TABS: 0.5000 mg | ORAL_TABLET | Freq: Every day | ORAL | 1 refills | Status: DC

## 2018-06-29 NOTE — Telephone Encounter (Signed)
Pt called wanting printed Alprazolam RX- left in black box up front- pt notified.

## 2018-07-03 IMAGING — US US ABDOMEN COMPLETE
2 series · 13 of 25 positions shown · non-contrast
Comparison: Ultrasound October 30, 2008.

CLINICAL DATA: Abdominal distension.

EXAM:
ABDOMEN ULTRASOUND COMPLETE

[Series 1: us abdomen complete · 0.28mm/px · 12 of 87 slices shown (1 of 2)]
[im 1/87]
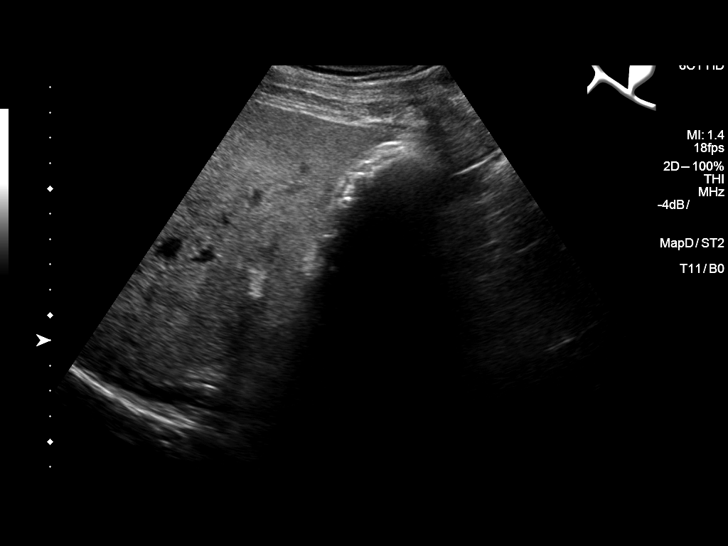
[im 8/87]
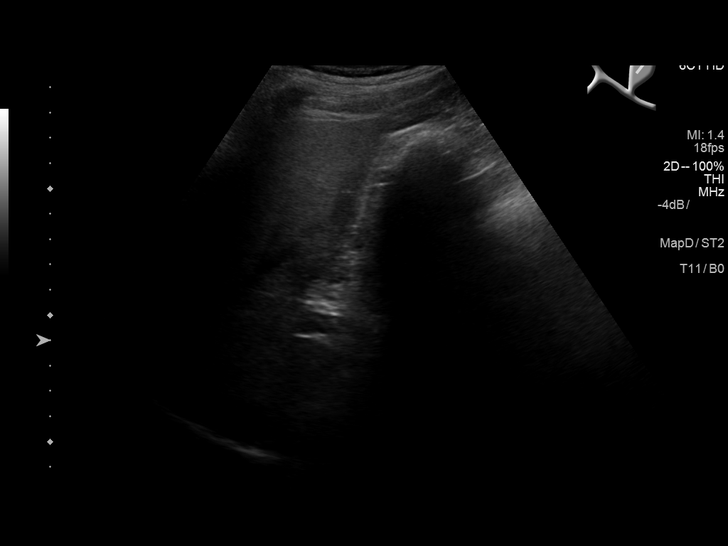
[im 15/87]
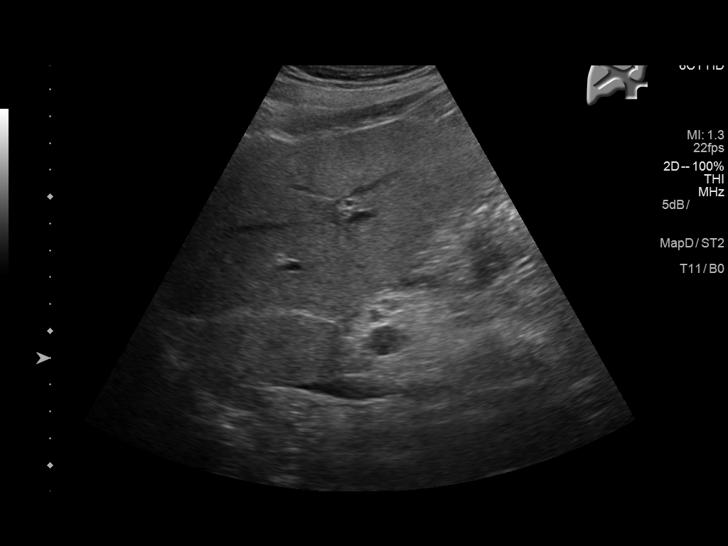
[im 23/87]
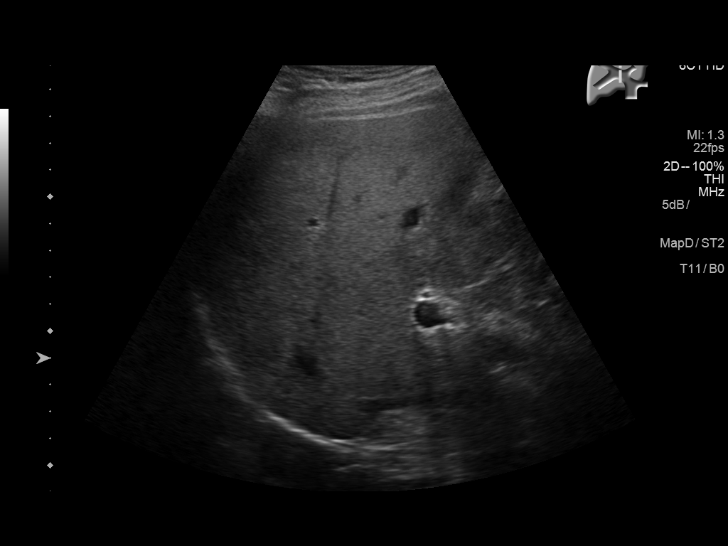
[im 30/87]
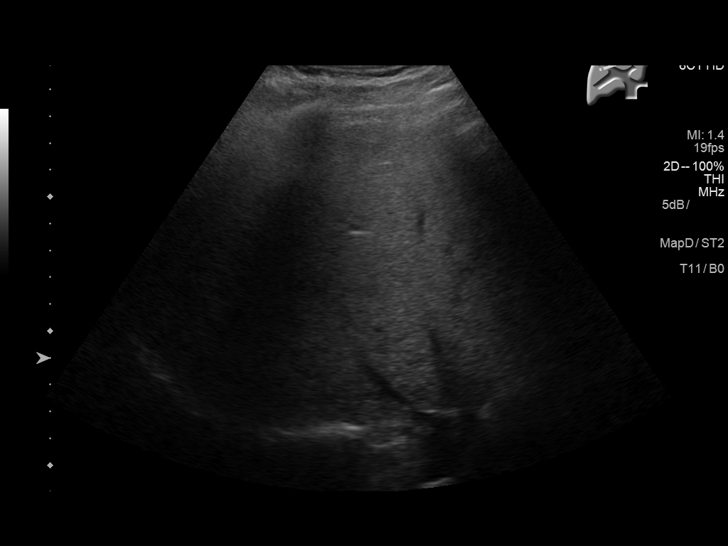
[im 38/87]
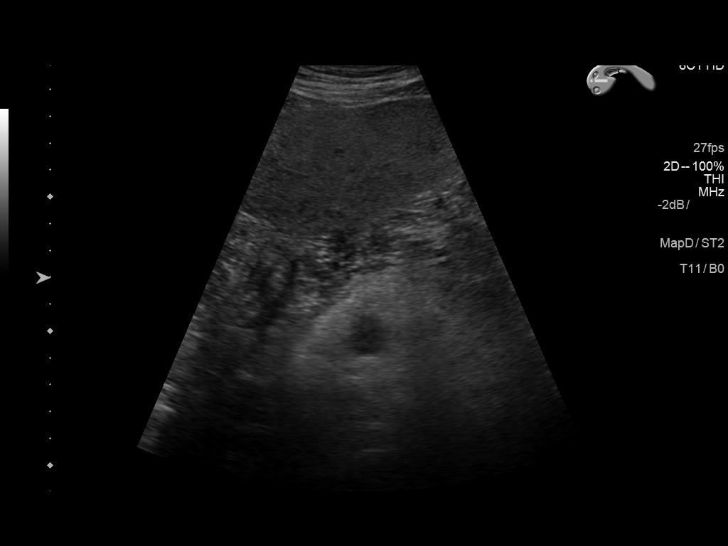
[im 45/87]
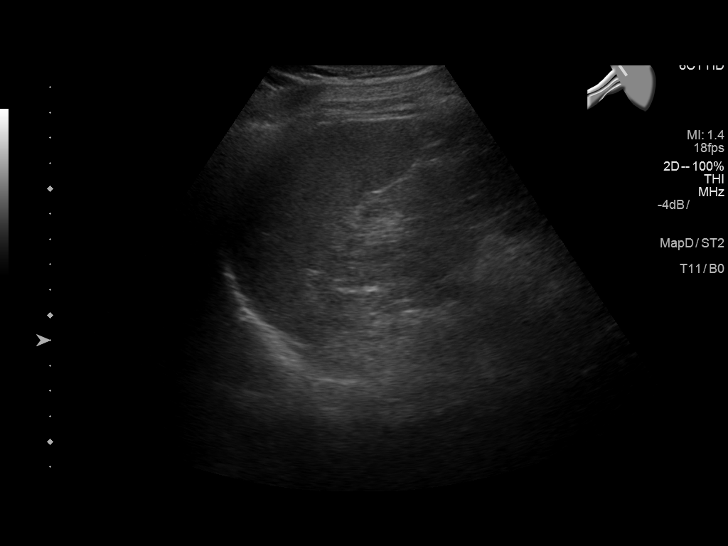
[im 53/87]
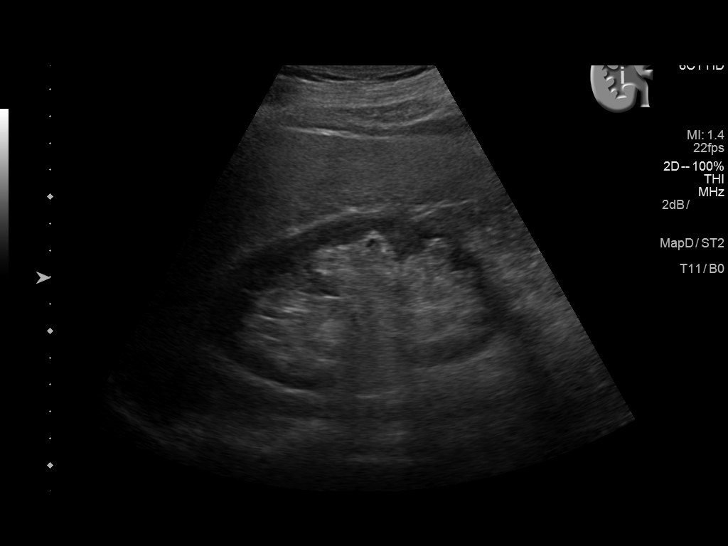
[im 60/87]
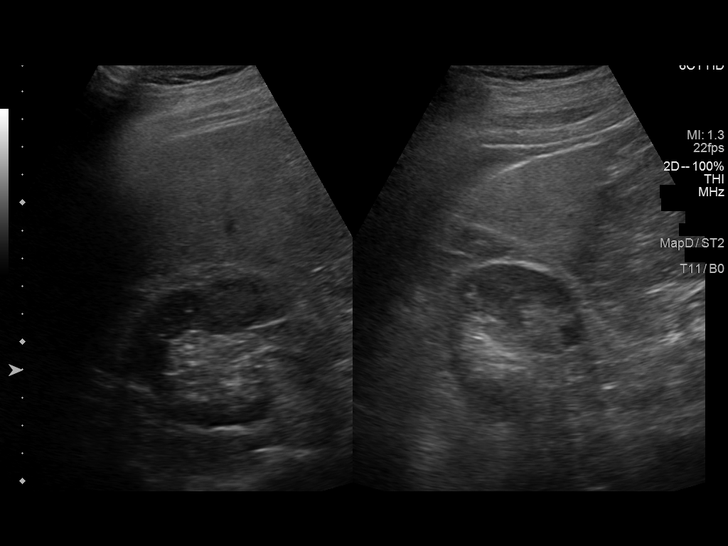
[im 68/87]
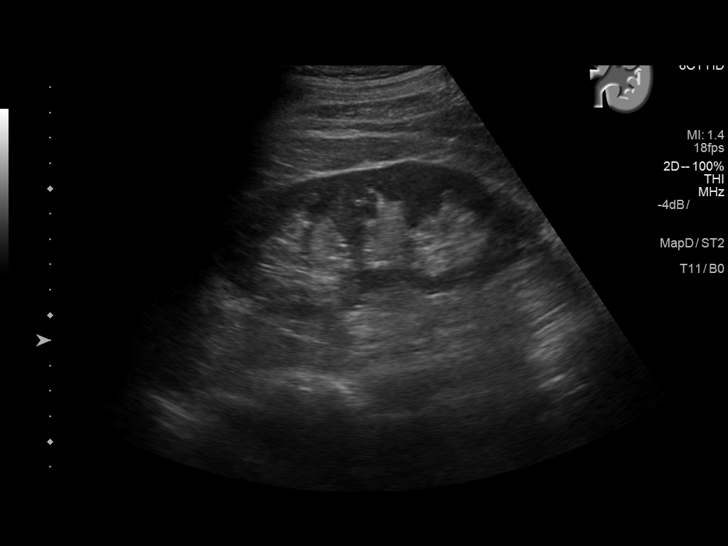
[im 75/87]
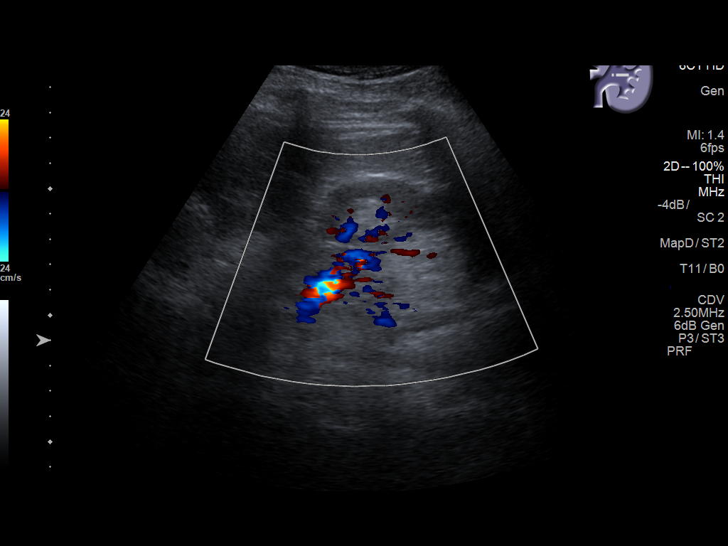
[im 83/87]
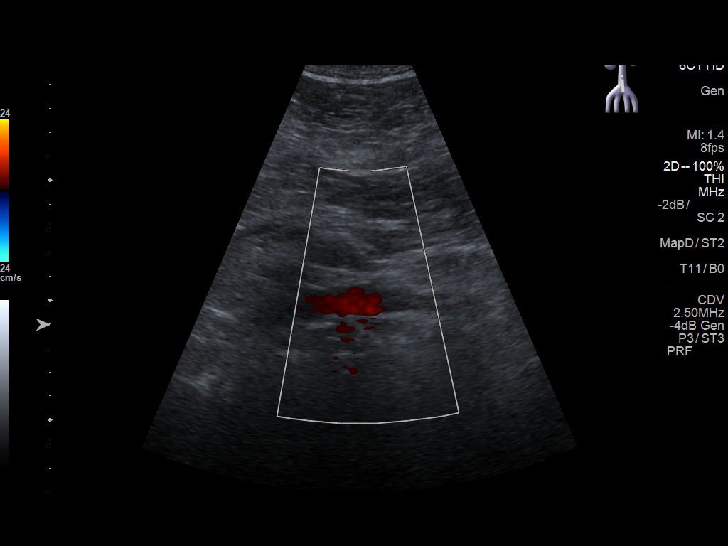

[Series 2001: us abdomen complete · 0.26mm/px · 1 of 2 slices shown (2 of 2)]
[im 1/2]
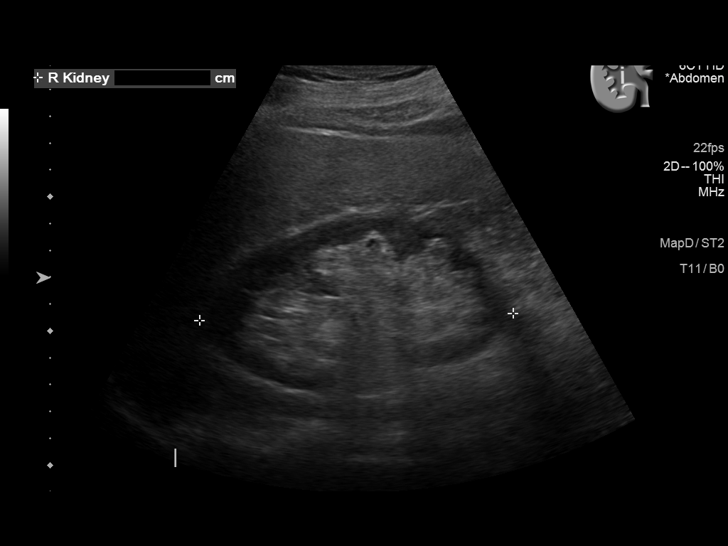

[13 of 25 positions shown; findings below may reference images not displayed]

FINDINGS: Gallbladder: Multiple gallstones are noted without definite
gallbladder wall thickening or pericholecystic fluid. No sonographic
Murphy's sign is noted. Largest calculus measures 7 mm

Common bile duct: Diameter: 3.8 mm which is within normal limits.

Liver: No focal lesion identified. Increased echogenicity of hepatic
parenchyma is noted suggesting fatty infiltration.

IVC: No abnormality visualized.

Pancreas: Visualized portion unremarkable.

Spleen: Size and appearance within normal limits.

Right Kidney: Length: 11.7 cm. Echogenicity within normal limits. No
mass or hydronephrosis visualized.

Left Kidney: Length: 12.7 cm. Echogenicity within normal limits. No
mass or hydronephrosis visualized.

Abdominal aorta: No aneurysm visualized.

Other findings: None.
IMPRESSION: Cholelithiasis without definite evidence of cholecystitis. Probable
fatty infiltration of the liver. No other abnormality seen in the
abdomen.

## 2018-08-29 ENCOUNTER — Ambulatory Visit: Payer: Medicare Other | Admitting: Family Medicine

## 2018-08-29 ENCOUNTER — Encounter: Payer: Self-pay | Admitting: Family Medicine

## 2018-08-29 ENCOUNTER — Other Ambulatory Visit: Payer: Self-pay

## 2018-08-29 VITALS — BP 120/70 | HR 100 | Ht 67.0 in | Wt 170.0 lb

## 2018-08-29 DIAGNOSIS — J209 Acute bronchitis, unspecified: Secondary | ICD-10-CM

## 2018-08-29 DIAGNOSIS — J440 Chronic obstructive pulmonary disease with acute lower respiratory infection: Secondary | ICD-10-CM | POA: Diagnosis not present

## 2018-08-29 DIAGNOSIS — J0100 Acute maxillary sinusitis, unspecified: Secondary | ICD-10-CM

## 2018-08-29 MED ORDER — AMOXICILLIN-POT CLAVULANATE 875-125 MG PO TABS: 1.0000 | ORAL_TABLET | Freq: Two times a day (BID) | ORAL | 0 refills | Status: DC

## 2018-08-29 MED ORDER — GUAIFENESIN-CODEINE 100-10 MG/5ML PO SYRP: 5.0000 mL | ORAL_SOLUTION | Freq: Four times a day (QID) | ORAL | 0 refills | Status: DC | PRN

## 2018-08-29 MED ORDER — ALBUTEROL SULFATE HFA 108 (90 BASE) MCG/ACT IN AERS: 2.0000 | INHALATION_SPRAY | Freq: Four times a day (QID) | RESPIRATORY_TRACT | 2 refills | Status: DC | PRN

## 2018-08-29 NOTE — Progress Notes (Signed)
Date:  08/29/2018   Name:  Joel Flores   DOB:  Mar 19, 1951   MRN:  [id number]   Chief Complaint: Sinusitis (cough and cong- clear/ white production. using flonase nasal spray)  Sinusitis  This is a new problem. The current episode started more than 1 month ago. The problem has been gradually worsening since onset. There has been no fever. His pain is at a severity of 2/10 (once a week). The pain is mild. Associated symptoms include congestion, coughing, headaches, shortness of breath and sinus pressure. Pertinent negatives include no chills, diaphoresis, ear pain, hoarse voice, neck pain, sneezing, sore throat or swollen glands. Treatments tried: fluticasone/mucinex. The treatment provided no relief.  Cough  This is a new problem. The current episode started more than 1 month ago. The problem has been gradually worsening. Associated symptoms include headaches, nasal congestion, postnasal drip, rhinorrhea and shortness of breath. Pertinent negatives include no chest pain, chills, ear congestion, ear pain, fever, heartburn, hemoptysis, myalgias, rash, sore throat, sweats, weight loss or wheezing. Nothing aggravates the symptoms. He has tried a beta-agonist inhaler for the symptoms. There is no history of environmental allergies.    Review of Systems  Constitutional: Negative for chills, diaphoresis, fever and weight loss.  HENT: Positive for congestion, postnasal drip, rhinorrhea and sinus pressure. Negative for drooling, ear discharge, ear pain, hoarse voice, sneezing and sore throat.   Respiratory: Positive for cough and shortness of breath. Negative for hemoptysis and wheezing.   Cardiovascular: Negative for chest pain, palpitations and leg swelling.  Gastrointestinal: Negative for abdominal pain, blood in stool, constipation, diarrhea, heartburn and nausea.  Endocrine: Negative for polydipsia.  Genitourinary: Negative for dysuria, frequency, hematuria and urgency.  Musculoskeletal: Negative  for back pain, myalgias and neck pain.  Skin: Negative for rash.  Allergic/Immunologic: Negative for environmental allergies.  Neurological: Positive for headaches. Negative for dizziness.  Hematological: Does not bruise/bleed easily.  Psychiatric/Behavioral: Negative for suicidal ideas. The patient is not nervous/anxious.     Patient Active Problem List   Diagnosis Date Noted  . Colon cancer screening   . Polyp of sigmoid colon   . SOBOE (shortness of breath on exertion) 07/30/2017  . PVD (peripheral vascular disease) ([place]) 11/17/2016  . Tobacco use disorder 11/17/2016  . Reactive airway disease, mild intermittent, uncomplicated 56/81/2751  . Allergic rhinitis due to pollen 08/13/2015  . Nocturia 08/13/2015  . Atherosclerosis of aorta ([place]) 02/12/2015  . Essential hypertension 02/12/2015  . Esophageal reflux 02/12/2015  . Depression 02/12/2015  . Adult hypothyroidism 02/12/2015  . Chronic anxiety 02/12/2015  . Hyperlipemia 02/12/2015    Allergies  Allergen Reactions  . Sulfa Antibiotics Rash    Past Surgical History:  Procedure Laterality Date  . CATARACT EXTRACTION W/PHACO Right 03/16/2018   Procedure: CATARACT EXTRACTION PHACO AND INTRAOCULAR LENS PLACEMENT (IOC);  Surgeon: Marchia Meiers, MD;  Location: [facility];  Service: Ophthalmology;  Laterality: Right;  [place]  01:05 CDE 11.85 Fluid pack lot # [id number]  . CHOLECYSTECTOMY    . COLONOSCOPY  2015   Dr Allen Norris- cleared for 5 years   . COLONOSCOPY WITH PROPOFOL N/A 01/17/2018   Procedure: COLONOSCOPY WITH PROPOFOL;  Surgeon: Lucilla Lame, MD;  Location: [place];  Service: Endoscopy;  Laterality: N/A;  requests early  . INSERTION OF MESH N/A 08/31/2017   Procedure: INSERTION OF MESH;  Surgeon: Vickie Epley, MD;  Location: [facility];  Service: General;  Laterality: N/A;  . POLYPECTOMY  01/17/2018   Procedure: POLYPECTOMY;  Surgeon: Lucilla Lame, MD;  Location: [place];  Service: Endoscopy;;  .  UMBILICAL HERNIA REPAIR N/A 08/31/2017   Procedure: HERNIA REPAIR UMBILICAL ADULT;  Surgeon: Vickie Epley, MD;  Location: [facility];  Service: General;  Laterality: N/A;  . VEIN SURGERY     2 stents in R) leg    Social History   Tobacco Use  . Smoking status: Current Every Day Smoker    Packs/day: 1.50    Years: 50.00    Pack years: 75.00  . Smokeless tobacco: Never Used  . Tobacco comment: pt wants to try patches again but states they are too costly  Substance Use Topics  . Alcohol use: Yes    Alcohol/week: 1.0 standard drinks    Types: 1 Cans of beer per week  . Drug use: No     Medication list has been reviewed and updated.  Current Meds  Medication Sig  . acetaminophen (TYLENOL) 325 MG tablet Take 325 mg by mouth daily as needed for moderate pain or headache.  . cetirizine (ZYRTEC) 10 MG tablet TAKE 1 TABLET BY MOUTH EVERY DAY  . clonazePAM (KLONOPIN) 0.5 MG tablet Take 1 tablet (0.5 mg total) by mouth daily. Take one half bid  . clopidogrel (PLAVIX) 75 MG tablet Take 1 tablet (75 mg total) by mouth daily.  Marland Kitchen dexlansoprazole (DEXILANT) 60 MG capsule Take 1 capsule (60 mg total) by mouth daily.  . fluticasone (FLONASE) 50 MCG/ACT nasal spray Place 2 sprays into both nostrils daily. (Patient taking differently: Place 2 sprays into both nostrils 2 (two) times daily. )  . ibuprofen (ADVIL,MOTRIN) 200 MG tablet Take 400 mg by mouth every 8 (eight) hours as needed (for pain).  Marland Kitchen levothyroxine (SYNTHROID, LEVOTHROID) 100 MCG tablet Take 1 tablet (100 mcg total) by mouth daily before breakfast.  . losartan (COZAAR) 25 MG tablet Take 2 tablets (50 mg total) by mouth daily.  . rosuvastatin (CRESTOR) 20 MG tablet Take 1 tablet (20 mg total) by mouth daily.  . sertraline (ZOLOFT) 100 MG tablet Take 1 tablet (100 mg total) by mouth daily.    PHQ 2/9 Scores 08/29/2018 06/17/2018 06/01/2018 02/09/2018  PHQ - 2 Score 0 0 0 2  PHQ- 9 Score 0 0 - 4    BP Readings from Last 3 Encounters:   08/29/18 120/70  06/17/18 122/64  06/01/18 120/80    Physical Exam Vitals signs and nursing note reviewed.  HENT:     Head: Normocephalic.     Right Ear: Tympanic membrane, ear canal and external ear normal.     Left Ear: Tympanic membrane, ear canal and external ear normal.     Nose: Nose normal. No congestion or rhinorrhea.     Mouth/Throat:     Mouth: Mucous membranes are moist.  Eyes:     General: No scleral icterus.       Right eye: No discharge.        Left eye: No discharge.     Conjunctiva/sclera: Conjunctivae normal.     Pupils: Pupils are equal, round, and reactive to light.  Neck:     Musculoskeletal: Normal range of motion and neck supple. No neck rigidity or muscular tenderness.     Thyroid: No thyromegaly.     Vascular: No carotid bruit or JVD.     Trachea: No tracheal deviation.  Cardiovascular:     Rate and Rhythm: Normal rate and regular rhythm.     Pulses: Normal pulses.  Heart sounds: Normal heart sounds. No murmur. No friction rub. No gallop.   Pulmonary:     Effort: Pulmonary effort is normal. No respiratory distress.     Breath sounds: Normal breath sounds. No stridor. No wheezing, rhonchi or rales.  Chest:     Chest wall: No tenderness.  Abdominal:     General: Abdomen is flat. Bowel sounds are normal. There is no distension.     Palpations: Abdomen is soft. There is no mass.     Tenderness: There is no abdominal tenderness. There is no guarding or rebound.     Hernia: No hernia is present.  Musculoskeletal: Normal range of motion.        General: No tenderness.  Lymphadenopathy:     Cervical: No cervical adenopathy.  Skin:    General: Skin is warm.     Findings: No rash.  Neurological:     General: No focal deficit present.     Mental Status: He is alert and oriented to person, place, and time.     Cranial Nerves: No cranial nerve deficit.     Sensory: No sensory deficit.     Motor: No weakness.     Coordination: Coordination normal.      Deep Tendon Reflexes: Reflexes are normal and symmetric.     Wt Readings from Last 3 Encounters:  08/29/18 170 lb (77.1 kg)  06/17/18 170 lb (77.1 kg)  06/16/18 170 lb (77.1 kg)    BP 120/70   Pulse 100   Ht 5\' 7"  (1.702 m)   Wt 170 lb (77.1 kg)   BMI 26.63 kg/m   Assessment and Plan:  1. Acute bronchitis with chronic obstructive pulmonary disease (COPD) ([place]) Patient has had a persistent cough over the past several months (2-3).  This is been a nonproductive cough but is been unrelenting.  Patient does have baseline COPD we will initiate Augmentin but this is primarily for sinusitis but should take care of any bronchitis.  Will give patient some Robitussin-AC for relief and cough. - amoxicillin-clavulanate (AUGMENTIN) 875-125 MG tablet; Take 1 tablet by mouth 2 (two) times daily.  Dispense: 20 tablet; Refill: 0 - guaiFENesin-codeine (ROBITUSSIN AC) 100-10 MG/5ML syrup; Take 5 mLs by mouth 4 (four) times daily as needed for cough.  Dispense: 118 mL; Refill: 0 - albuterol (VENTOLIN HFA) 108 (90 Base) MCG/ACT inhaler; Inhale 2 puffs into the lungs every 6 (six) hours as needed for wheezing or shortness of breath.  Dispense: 1 Inhaler; Refill: 2  2. Acute maxillary sinusitis, recurrence not specified Acute. Persistent . Initiate augmentin 875 mg bidfor 10 days. - amoxicillin-clavulanate (AUGMENTIN) 875-125 MG tablet; Take 1 tablet by mouth 2 (two) times daily.  Dispense: 20 tablet; Refill: 0

## 2018-09-11 ENCOUNTER — Other Ambulatory Visit: Payer: Self-pay | Admitting: Family Medicine

## 2018-09-20 ENCOUNTER — Other Ambulatory Visit: Payer: Self-pay | Admitting: Family Medicine

## 2018-09-20 DIAGNOSIS — I10 Essential (primary) hypertension: Secondary | ICD-10-CM

## 2018-10-14 ENCOUNTER — Other Ambulatory Visit: Payer: Self-pay | Admitting: Family Medicine

## 2018-10-14 DIAGNOSIS — I10 Essential (primary) hypertension: Secondary | ICD-10-CM

## 2018-11-03 ENCOUNTER — Other Ambulatory Visit: Payer: Self-pay | Admitting: Family Medicine

## 2018-11-04 ENCOUNTER — Other Ambulatory Visit: Payer: Self-pay | Admitting: Family Medicine

## 2018-11-08 ENCOUNTER — Other Ambulatory Visit: Payer: Self-pay | Admitting: Family Medicine

## 2018-11-08 DIAGNOSIS — I10 Essential (primary) hypertension: Secondary | ICD-10-CM

## 2018-11-11 ENCOUNTER — Other Ambulatory Visit: Payer: Self-pay

## 2018-11-11 ENCOUNTER — Encounter: Payer: Self-pay | Admitting: Family Medicine

## 2018-11-11 ENCOUNTER — Ambulatory Visit: Payer: Medicare Other | Admitting: Family Medicine

## 2018-11-11 VITALS — BP 130/80 | HR 80 | Ht 67.0 in | Wt 168.0 lb

## 2018-11-11 DIAGNOSIS — F419 Anxiety disorder, unspecified: Secondary | ICD-10-CM

## 2018-11-11 DIAGNOSIS — J0100 Acute maxillary sinusitis, unspecified: Secondary | ICD-10-CM

## 2018-11-11 DIAGNOSIS — I10 Essential (primary) hypertension: Secondary | ICD-10-CM | POA: Diagnosis not present

## 2018-11-11 DIAGNOSIS — K219 Gastro-esophageal reflux disease without esophagitis: Secondary | ICD-10-CM | POA: Diagnosis not present

## 2018-11-11 DIAGNOSIS — F3342 Major depressive disorder, recurrent, in full remission: Secondary | ICD-10-CM

## 2018-11-11 DIAGNOSIS — E039 Hypothyroidism, unspecified: Secondary | ICD-10-CM

## 2018-11-11 DIAGNOSIS — R69 Illness, unspecified: Secondary | ICD-10-CM

## 2018-11-11 DIAGNOSIS — I700 Atherosclerosis of aorta: Secondary | ICD-10-CM | POA: Diagnosis not present

## 2018-11-11 MED ORDER — CLOPIDOGREL BISULFATE 75 MG PO TABS: 75.0000 mg | ORAL_TABLET | Freq: Every day | ORAL | 1 refills | Status: DC

## 2018-11-11 MED ORDER — AZITHROMYCIN 250 MG PO TABS: ORAL_TABLET | ORAL | 0 refills | Status: DC

## 2018-11-11 MED ORDER — DEXILANT 60 MG PO CPDR: 1.0000 | DELAYED_RELEASE_CAPSULE | Freq: Every day | ORAL | 1 refills | Status: DC

## 2018-11-11 MED ORDER — SERTRALINE HCL 100 MG PO TABS: 100.0000 mg | ORAL_TABLET | Freq: Every day | ORAL | 1 refills | Status: DC

## 2018-11-11 MED ORDER — LOSARTAN POTASSIUM 25 MG PO TABS: 50.0000 mg | ORAL_TABLET | Freq: Every day | ORAL | 1 refills | Status: DC

## 2018-11-11 MED ORDER — LEVOTHYROXINE SODIUM 100 MCG PO TABS: 100.0000 ug | ORAL_TABLET | Freq: Every day | ORAL | 1 refills | Status: DC

## 2018-11-11 MED ORDER — ROSUVASTATIN CALCIUM 20 MG PO TABS: 20.0000 mg | ORAL_TABLET | Freq: Every day | ORAL | 1 refills | Status: DC

## 2018-11-11 MED ORDER — CLONAZEPAM 0.5 MG PO TABS: 0.2500 mg | ORAL_TABLET | Freq: Every day | ORAL | 1 refills | Status: DC

## 2018-11-11 NOTE — Patient Instructions (Signed)
Melanoma Melanoma is a form of skin cancer that begins in the skin cells that produce pigment (melanocytes). Melanoma usually starts as a mole or growth (lesion) on the skin and can spread to other areas of the body. If found early and treated, many cases of melanoma are curable. What are the causes? The exact cause of this condition is unknown. What increases the risk? The following factors may make you more likely to develop this condition:  Spending a lot of time in the sun, under a sunlamp, or in a tanning booth.  Having sunburn that blisters. The more blistering sunburns you have had, the higher your risk for melanoma becomes.  Spending time in parts of the world where sunlight is intense.  Living in a hot, sunny climate.  Having fair skin that does not tan easily.  Having had melanoma before.  Having a family history of melanoma.  Having many skin moles or unusual moles (dysplastic nevi).  Having a weakened immune system. What are the signs or symptoms? Symptoms of this condition include:  ABCDE changes in a mole. ABCDE stands for: ? Asymmetry. This means the mole has an irregular shape. It is not round or oval. ? Border. This means the mole has an irregular or bumpy border. ? Color. This means the mole has multiple colors in it, including brown, black, blue, red, or tan. ? Diameter. This means the mole is more than 0.2 in (6 mm) across. ? Evolving. This refers to any unusual changes or symptoms in the mole, such as pain, itching, stinging, sensitivity, or bleeding.  A new mole or skin lesion. Symptoms of melanoma that may have spread to other areas of the body include:  Swollen lymph nodes.  Shortness of breath.  Bone pain.  Headache.  Seizures.  Visual problems. How is this diagnosed? This condition is diagnosed by examining a tissue sample from the mole (biopsy). The biopsy will reveal whether melanoma has spread to deeper layers of the skin. You may also  have other tests, including:  Blood tests.  Chest X-rays.  Sentinel node biopsy.  A CT scan.  A bone scan. How is this treated? This condition is treated with surgery to remove the cancer as well as some of the tissue around where the cancer was found. Your lymph nodes may also be removed during the surgery. If melanoma has spread to other areas, such as the lymph nodes, liver, lungs, bone, or brain, you will need additional treatment, which may include:  Chemotherapy. This treatment uses medicine to kill cancer cells.  Radiation therapy. This treatment uses high-energy rays to kill cancer cells.  Targeted therapy. This treatment uses medicines that target specific genetic mutations linked to melanoma.  Biological therapy (immunotherapy). This treatment acts on the immune system to help kill cancer cells. A combination of surgery and other treatments may be recommended. Melanoma can come back after treatment (recur). Follow these instructions at home: Medicines  Take over-the-counter and prescription medicines only as told by your health care provider.  Talk with your health care provider before taking vitamins, supplements, and over-the-counter medicines. Some of these can interfere with chemotherapy. Eating and drinking  Drink enough fluid to keep your urine pale yellow.  Talk to a dietitian about what you should eat and drink during cancer treatment.  If you have side effects that affect eating, it may help to: ? Eat smaller meals and snacks often. ? Drink high-nutrition and high-calorie shakes or supplements. ? Eat bland and  soft foods that are easy to eat. ? Do not eat foods that are hot, spicy, or difficult to swallow. Lifestyle   Stay out of the sun, especially during peak mid-afternoon hours. Take the following precautions every day, even on cloudy days and in the winter, and even if you will be outdoors for only a short period of time. ? Wear long sleeves, a hat,  and sunglasses that block UV light when possible. ? Regularly apply a sunscreen with an SPF of 30 or higher. ? Consider cosmetics that contain an SPF. ? Limit sun exposure in the middle part of the day.  Get regular exercise, such as walking, gentle yoga, or tai chi.  Do not use any products that contain nicotine or tobacco, such as cigarettes and e-cigarettes. If you need help quitting, ask your health care provider.  Consider joining a support group. Ask your health care provider to recommend support groups online or in your community. General instructions  Learn as much as you can about your condition.  If you lose your hair, wear a wig, hat, or scarf to cover your head.  Meet with a hair and skin care specialist for makeup and skin care tips.  Work with your health care provider to manage side effects of treatment.  Keep all follow-up visits as told by your health care provider. This is important. You will need to have regular visits during and after treatment as a part of your care. Where to find more information  [place]: https://www.cancer.gov  American Cancer Society: http://www.cancer.org Contact a health care provider if:  You notice any new growths or changes in your skin.  You have had melanoma removed and you notice a new growth in the same location.  You have any new pain or new symptoms. Get help right away if:  You have a seizure.  You have chest pain.  You have trouble breathing.  You have a fever.  You have bleeding that does not stop. Summary  Melanoma is a form of skin cancer that begins in the skin cells that produce pigment (melanocytes). Melanoma usually starts as a mole or growth (lesion) on the skin and can spread to other areas of the body.  This condition is diagnosed by examining a tissue sample from a mole (biopsy).  If found early and treated, many cases of melanoma are curable.  A combination of surgery and other  treatments may be recommended.  Keep all follow-up visits as told by your health care provider. This is important. You will need to have regular visits during and after treatment as a part of your care. This information is not intended to replace advice given to you by your health care provider. Make sure you discuss any questions you have with your health care provider. Document Released: 04/13/2005 Document Revised: 04/26/2017 Document Reviewed: 04/26/2017 Elsevier Patient Education  2020 [employer].

## 2018-11-11 NOTE — Progress Notes (Signed)
Date:  11/11/2018   Name:  Joel Flores   DOB:  Sep 16, 1950   MRN:  [id number]   Chief Complaint: Gastroesophageal Reflux, Anxiety (GAD7=0), Hypothyroidism, Depression (PHQ9=0), Hypertension, Coronary Artery Disease, and Hyperlipidemia  Gastroesophageal Reflux He complains of coughing. He reports no abdominal pain, no belching, no chest pain, no choking, no dysphagia, no early satiety, no globus sensation, no heartburn, no hoarse voice, no nausea, no sore throat, no stridor, no tooth decay, no water brash or no wheezing. This is a new problem. The current episode started more than 1 year ago. The problem occurs frequently. The problem has been unchanged. The symptoms are aggravated by certain foods. Pertinent negatives include no anemia, fatigue, muscle weakness, orthopnea or weight loss. Risk factors include lack of exercise. He has tried a PPI for the symptoms. The treatment provided moderate relief. Past procedures do not include an abdominal ultrasound, an EGD, esophageal manometry, esophageal pH monitoring, H. pylori antibody titer or a UGI.  Anxiety Presents for follow-up visit. Symptoms include excessive worry, irritability and nervous/anxious behavior. Patient reports no chest pain, compulsions, confusion, decreased concentration, depressed mood, dizziness, feeling of choking, hyperventilation, insomnia, nausea, obsessions, palpitations, panic, restlessness, shortness of breath or suicidal ideas. The severity of symptoms is moderate.   His past medical history is significant for CAD.  Depression        This is a chronic problem.  The onset quality is gradual.   The problem occurs intermittently.  Associated symptoms include helplessness.  Associated symptoms include no decreased concentration, no fatigue, no hopelessness, does not have insomnia, not irritable, no restlessness, no decreased interest, no appetite change, no body aches, no myalgias, no headaches, no indigestion, not sad and no  suicidal ideas.     The symptoms are aggravated by family issues.  Past treatments include SSRIs - Selective serotonin reuptake inhibitors.  Compliance with treatment is variable.  Previous treatment provided moderate relief.  Past medical history includes thyroid problem and anxiety.     Pertinent negatives include no hypothyroidism. Hypertension This is a chronic problem. The current episode started in the past 7 days. The problem is unchanged. The problem is controlled. Associated symptoms include anxiety. Pertinent negatives include no blurred vision, chest pain, headaches, malaise/fatigue, neck pain, orthopnea, palpitations, peripheral edema, PND, shortness of breath or sweats. There are no associated agents to hypertension. Risk factors for coronary artery disease include dyslipidemia. Past treatments include angiotensin blockers. The current treatment provides moderate improvement. There is no history of angina, kidney disease, CAD/MI, CVA, heart failure, left ventricular hypertrophy, PVD or retinopathy. Identifiable causes of hypertension include a thyroid problem. There is no history of chronic renal disease, a hypertension causing med or renovascular disease.  Coronary Artery Disease Presents for follow-up visit. Pertinent negatives include no chest pain, chest pressure, chest tightness, dizziness, leg swelling, muscle weakness, palpitations, shortness of breath or weight gain. Risk factors include hyperlipidemia and hypertension. The symptoms have been stable.  Hyperlipidemia This is a chronic problem. The current episode started more than 1 year ago. The problem is controlled. Recent lipid tests were reviewed and are normal. He has no history of chronic renal disease, diabetes, hypothyroidism or liver disease. Pertinent negatives include no chest pain, myalgias or shortness of breath.  Sinusitis This is a new problem. The current episode started in the past 7 days. The problem has been gradually  worsening since onset. There has been no fever. The pain is mild. Associated symptoms include congestion, coughing, sinus  pressure and sneezing. Pertinent negatives include no chills, diaphoresis, ear pain, headaches, hoarse voice, neck pain, shortness of breath, sore throat or swollen glands. Treatments tried: antihist/nasal steroid.  Thyroid Problem Presents for follow-up visit. Symptoms include anxiety. Patient reports no cold intolerance, constipation, depressed mood, diaphoresis, diarrhea, fatigue, hair loss, heat intolerance, hoarse voice, leg swelling, nail problem, palpitations, tremors, visual change, weight gain or weight loss. His past medical history is significant for hyperlipidemia. There is no history of diabetes or heart failure.    Review of Systems  Constitutional: Positive for irritability. Negative for appetite change, chills, diaphoresis, fatigue, malaise/fatigue, weight gain and weight loss.  HENT: Positive for congestion, sinus pressure and sneezing. Negative for ear pain, hoarse voice and sore throat.   Eyes: Negative for blurred vision.  Respiratory: Positive for cough. Negative for choking, chest tightness, shortness of breath and wheezing.   Cardiovascular: Negative for chest pain, palpitations, orthopnea, leg swelling and PND.  Gastrointestinal: Negative for abdominal pain, constipation, diarrhea, dysphagia, heartburn and nausea.  Endocrine: Negative for cold intolerance and heat intolerance.  Musculoskeletal: Negative for myalgias, muscle weakness and neck pain.  Neurological: Negative for dizziness, tremors and headaches.  Psychiatric/Behavioral: Positive for depression. Negative for confusion, decreased concentration and suicidal ideas. The patient is nervous/anxious. The patient does not have insomnia.     Patient Active Problem List   Diagnosis Date Noted   Colon cancer screening    Polyp of sigmoid colon    SOBOE (shortness of breath on exertion) 07/30/2017     PVD (peripheral vascular disease) ([place]) 11/17/2016   Tobacco use disorder 11/17/2016   Reactive airway disease, mild intermittent, uncomplicated 38/01/1750   Allergic rhinitis due to pollen 08/13/2015   Nocturia 08/13/2015   Atherosclerosis of aorta ([place]) 02/12/2015   Essential hypertension 02/12/2015   Esophageal reflux 02/12/2015   Depression 02/12/2015   Adult hypothyroidism 02/12/2015   Chronic anxiety 02/12/2015   Hyperlipemia 02/12/2015    Allergies  Allergen Reactions   Sulfa Antibiotics Rash    Past Surgical History:  Procedure Laterality Date   CATARACT EXTRACTION W/PHACO Right 03/16/2018   Procedure: CATARACT EXTRACTION PHACO AND INTRAOCULAR LENS PLACEMENT ([place]);  Surgeon: Marchia Meiers, MD;  Location: [facility];  Service: Ophthalmology;  Laterality: Right;  [place]  01:05 CDE 11.85 Fluid pack lot # [id number]   CHOLECYSTECTOMY     COLONOSCOPY  2015   Dr Allen Norris- cleared for 5 years    COLONOSCOPY WITH PROPOFOL N/A 01/17/2018   Procedure: COLONOSCOPY WITH PROPOFOL;  Surgeon: Lucilla Lame, MD;  Location: [place];  Service: Endoscopy;  Laterality: N/A;  requests early   INSERTION OF MESH N/A 08/31/2017   Procedure: INSERTION OF MESH;  Surgeon: Vickie Epley, MD;  Location: [facility];  Service: General;  Laterality: N/A;   POLYPECTOMY  01/17/2018   Procedure: POLYPECTOMY;  Surgeon: Lucilla Lame, MD;  Location: [place];  Service: Endoscopy;;   UMBILICAL HERNIA REPAIR N/A 08/31/2017   Procedure: HERNIA REPAIR UMBILICAL ADULT;  Surgeon: Vickie Epley, MD;  Location: [facility];  Service: General;  Laterality: N/A;   VEIN SURGERY     2 stents in R) leg    Social History   Tobacco Use   Smoking status: Current Every Day Smoker    Packs/day: 1.50    Years: 50.00    Pack years: 75.00   Smokeless tobacco: Never Used   Tobacco comment: pt wants to try patches again but states they are too costly  Substance Use  Topics   Alcohol  use: Yes    Alcohol/week: 1.0 standard drinks    Types: 1 Cans of beer per week   Drug use: No     Medication list has been reviewed and updated.  Current Meds  Medication Sig   acetaminophen (TYLENOL) 325 MG tablet Take 325 mg by mouth daily as needed for moderate pain or headache.   albuterol (VENTOLIN HFA) 108 (90 Base) MCG/ACT inhaler Inhale 2 puffs into the lungs every 6 (six) hours as needed for wheezing or shortness of breath.   cetirizine (ZYRTEC) 10 MG tablet TAKE 1 TABLET BY MOUTH EVERY DAY   clonazePAM (KLONOPIN) 0.5 MG tablet Take 1 tablet (0.5 mg total) by mouth daily. Take one half bid   clopidogrel (PLAVIX) 75 MG tablet Take 1 tablet (75 mg total) by mouth daily.   dexlansoprazole (DEXILANT) 60 MG capsule Take 1 capsule (60 mg total) by mouth daily.   fluticasone (FLONASE) 50 MCG/ACT nasal spray SPRAY 2 SPRAYS INTO EACH NOSTRIL EVERY DAY   ibuprofen (ADVIL,MOTRIN) 200 MG tablet Take 400 mg by mouth every 8 (eight) hours as needed (for pain).   levothyroxine (SYNTHROID, LEVOTHROID) 100 MCG tablet Take 1 tablet (100 mcg total) by mouth daily before breakfast.   losartan (COZAAR) 25 MG tablet TAKE 2 TABLETS BY MOUTH EVERY DAY   rosuvastatin (CRESTOR) 20 MG tablet Take 1 tablet (20 mg total) by mouth daily.   sertraline (ZOLOFT) 100 MG tablet Take 1 tablet (100 mg total) by mouth daily.    PHQ 2/9 Scores 11/11/2018 08/29/2018 06/17/2018 06/01/2018  PHQ - 2 Score 0 0 0 0  PHQ- 9 Score 0 0 0 -    BP Readings from Last 3 Encounters:  11/11/18 130/80  08/29/18 120/70  06/17/18 122/64    Physical Exam Vitals signs reviewed.  Constitutional:      General: He is not irritable. HENT:     Head: Normocephalic.     Right Ear: External ear normal.     Left Ear: External ear normal.     Nose: Nose normal.  Eyes:     General: No scleral icterus.       Right eye: No discharge.        Left eye: No discharge.     Conjunctiva/sclera: Conjunctivae normal.     Pupils:  Pupils are equal, round, and reactive to light.  Neck:     Musculoskeletal: Normal range of motion and neck supple.     Thyroid: No thyromegaly.     Vascular: No JVD.     Trachea: No tracheal deviation.  Cardiovascular:     Rate and Rhythm: Normal rate and regular rhythm.     Heart sounds: Normal heart sounds. No murmur. No friction rub. No gallop.   Pulmonary:     Effort: No respiratory distress.     Breath sounds: Normal breath sounds. No wheezing or rales.  Abdominal:     General: Bowel sounds are normal.     Palpations: Abdomen is soft. There is no mass.     Tenderness: There is no abdominal tenderness. There is no guarding or rebound.  Musculoskeletal: Normal range of motion.        General: No tenderness.  Lymphadenopathy:     Cervical: No cervical adenopathy.  Skin:    General: Skin is warm.     Findings: No rash.  Neurological:     Mental Status: He is alert and oriented to person, place, and time.  Cranial Nerves: No cranial nerve deficit.     Deep Tendon Reflexes: Reflexes are normal and symmetric.     Wt Readings from Last 3 Encounters:  11/11/18 168 lb (76.2 kg)  08/29/18 170 lb (77.1 kg)  06/17/18 170 lb (77.1 kg)    BP 130/80    Pulse 80    Ht 5\' 7"  (1.702 m)    Wt 168 lb (76.2 kg)    BMI 26.31 kg/m   Assessment and Plan:  1. Chronic anxiety Chronic.  Recent exacerbation due to illness of spouse.  Will continue clonazepam 0.5 1/2-1 as needed - clonazePAM (KLONOPIN) 0.5 MG tablet; Take 0.5 tablets (0.25 mg total) by mouth daily. Take one half daily  Dispense: 45 tablet; Refill: 1  2. Atherosclerosis of aorta [facility]) Patient will continue 81 mg ASA enteric-coated aspirin. - clopidogrel (PLAVIX) 75 MG tablet; Take 1 tablet (75 mg total) by mouth daily.  Dispense: 90 tablet; Refill: 1 - rosuvastatin (CRESTOR) 20 MG tablet; Take 1 tablet (20 mg total) by mouth daily.  Dispense: 90 tablet; Refill: 1  3. Gastroesophageal reflux disease, esophagitis presence  not specified Chronic.  Controlled.  Continue Dexilant 60 mg 1 capsule daily - dexlansoprazole (DEXILANT) 60 MG capsule; Take 1 capsule (60 mg total) by mouth daily.  Dispense: 90 capsule; Refill: 1  4. Adult hypothyroidism Chronic.  Controlled.  Continue levothyroxine 100 mcg daily - levothyroxine (SYNTHROID) 100 MCG tablet; Take 1 tablet (100 mcg total) by mouth daily before breakfast.  Dispense: 90 tablet; Refill: 1 - Glucose  5. Essential hypertension Chronic.  Controlled.  Continue losartan 25 mg 2 tablets daily - losartan (COZAAR) 25 MG tablet; Take 2 tablets (50 mg total) by mouth daily.  Dispense: 180 tablet; Refill: 1 - Glucose  6. Recurrent major depressive disorder, in full remission ([place]) Chronic.  Mild exacerbation due to family stress circumstance.  We will continue sertraline 100 mg once a day.  Patient will undergo evaluation for possible hepatotoxicity. - sertraline (ZOLOFT) 100 MG tablet; Take 1 tablet (100 mg total) by mouth daily.  Dispense: 90 tablet; Refill: 1  7. Taking medication for chronic disease Hepatotoxicity to evaluate with hepatic function panel - Hepatic function panel - CBC w/Diff/Platelet  8. Acute non-recurrent maxillary sinusitis New onset.  Acute maxillary pressure with purulent nasal drainage.  Patient has spouse that is recently been diagnosed with melanoma and may be undergoing chemotherapy and we will initiate coverage with antibiotic treatment with azithromycin to 50 mg 2 today followed by 1 a day for 4 days - azithromycin (ZITHROMAX) 250 MG tablet; 2 today then 1 a day for 4 day  Dispense: 6 tablet; Refill: 0

## 2018-11-12 LAB — HEPATIC FUNCTION PANEL
ALT: 18 IU/L (ref 0–44)
AST: 32 IU/L (ref 0–40)
Albumin: 4.5 g/dL (ref 3.8–4.8)
Alkaline Phosphatase: 96 IU/L (ref 39–117)
Bilirubin Total: 0.3 mg/dL (ref 0.0–1.2)
Bilirubin, Direct: 0.1 mg/dL (ref 0.00–0.40)
Total Protein: 7.2 g/dL (ref 6.0–8.5)

## 2018-11-12 LAB — CBC WITH DIFFERENTIAL/PLATELET
Basophils Absolute: 0.1 10*3/uL (ref 0.0–0.2)
Basos: 1 %
EOS (ABSOLUTE): 0.3 10*3/uL (ref 0.0–0.4)
Eos: 4 %
Hematocrit: 44.1 % (ref 37.5–51.0)
Hemoglobin: 15.1 g/dL (ref 13.0–17.7)
Immature Grans (Abs): 0 10*3/uL (ref 0.0–0.1)
Immature Granulocytes: 0 %
Lymphocytes Absolute: 1.9 10*3/uL (ref 0.7–3.1)
Lymphs: 23 %
MCH: 29.3 pg (ref 26.6–33.0)
MCHC: 34.2 g/dL (ref 31.5–35.7)
MCV: 86 fL (ref 79–97)
Monocytes Absolute: 1.7 10*3/uL — ABNORMAL HIGH (ref 0.1–0.9)
Monocytes: 20 %
Neutrophils Absolute: 4.4 10*3/uL (ref 1.4–7.0)
Neutrophils: 52 %
Platelets: 322 10*3/uL (ref 150–450)
RBC: 5.15 x10E6/uL (ref 4.14–5.80)
RDW: 14 % (ref 11.6–15.4)
WBC: 8.5 10*3/uL (ref 3.4–10.8)

## 2018-11-12 LAB — GLUCOSE, RANDOM: Glucose: 99 mg/dL (ref 65–99)

## 2018-11-21 ENCOUNTER — Ambulatory Visit
Admission: RE | Admit: 2018-11-21 | Discharge: 2018-11-21 | Disposition: A | Discharge status: Home / Self Care | Payer: Medicare Other | Source: Ambulatory Visit | Attending: Family Medicine | Admitting: Family Medicine

## 2018-11-21 ENCOUNTER — Ambulatory Visit: Payer: Medicare Other | Admitting: Family Medicine

## 2018-11-21 ENCOUNTER — Ambulatory Visit
Admission: RE | Admit: 2018-11-21 | Discharge: 2018-11-21 | Disposition: A | Discharge status: Home / Self Care | Payer: Medicare Other | Attending: Family Medicine | Admitting: Family Medicine

## 2018-11-21 ENCOUNTER — Encounter: Payer: Self-pay | Admitting: Family Medicine

## 2018-11-21 ENCOUNTER — Other Ambulatory Visit: Payer: Self-pay

## 2018-11-21 VITALS — BP 130/84 | HR 80 | Temp 97.9°F | Ht 67.0 in | Wt 168.0 lb

## 2018-11-21 DIAGNOSIS — J441 Chronic obstructive pulmonary disease with (acute) exacerbation: Secondary | ICD-10-CM | POA: Insufficient documentation

## 2018-11-21 MED ORDER — PREDNISONE 10 MG PO TABS: 10.0000 mg | ORAL_TABLET | Freq: Every day | ORAL | 0 refills | Status: DC

## 2018-11-21 MED ORDER — DOXYCYCLINE HYCLATE 100 MG PO TABS: 100.0000 mg | ORAL_TABLET | Freq: Two times a day (BID) | ORAL | 0 refills | Status: DC

## 2018-11-21 MED ORDER — GUAIFENESIN-CODEINE 100-10 MG/5ML PO SYRP: 5.0000 mL | ORAL_SOLUTION | Freq: Four times a day (QID) | ORAL | 0 refills | Status: DC | PRN

## 2018-11-21 NOTE — Progress Notes (Signed)
Date:  11/21/2018   Name:  Joel Flores   DOB:  1950-08-26   MRN:  [id number]   Chief Complaint: Cough (no fever- finished ZPack Friday)  Cough This is a new problem. The current episode started 1 to 4 weeks ago (10 days). The problem has been waxing and waning. The problem occurs constantly. The cough is productive of sputum (yellow some). Associated symptoms include postnasal drip, shortness of breath and wheezing. Pertinent negatives include no chest pain, chills, ear congestion, ear pain, fever, headaches, heartburn, hemoptysis, myalgias, nasal congestion, rash, rhinorrhea, sore throat, sweats or weight loss. There is no history of environmental allergies.    Review of Systems  Constitutional: Negative for chills, fever and weight loss.  HENT: Positive for postnasal drip. Negative for drooling, ear discharge, ear pain, rhinorrhea and sore throat.   Respiratory: Positive for cough, shortness of breath and wheezing. Negative for hemoptysis.   Cardiovascular: Negative for chest pain, palpitations and leg swelling.  Gastrointestinal: Negative for abdominal pain, blood in stool, constipation, diarrhea, heartburn and nausea.  Endocrine: Negative for polydipsia.  Genitourinary: Negative for dysuria, frequency, hematuria and urgency.  Musculoskeletal: Negative for back pain, myalgias and neck pain.  Skin: Negative for rash.  Allergic/Immunologic: Negative for environmental allergies.  Neurological: Negative for dizziness and headaches.  Hematological: Does not bruise/bleed easily.  Psychiatric/Behavioral: Negative for suicidal ideas. The patient is not nervous/anxious.     Patient Active Problem List   Diagnosis Date Noted  . Colon cancer screening   . Polyp of sigmoid colon   . SOBOE (shortness of breath on exertion) 07/30/2017  . PVD (peripheral vascular disease) ([place]) 11/17/2016  . Tobacco use disorder 11/17/2016  . Reactive airway disease, mild intermittent, uncomplicated  87/68/1157  . Allergic rhinitis due to pollen 08/13/2015  . Nocturia 08/13/2015  . Atherosclerosis of aorta ([place]) 02/12/2015  . Essential hypertension 02/12/2015  . Esophageal reflux 02/12/2015  . Depression 02/12/2015  . Adult hypothyroidism 02/12/2015  . Chronic anxiety 02/12/2015  . Hyperlipemia 02/12/2015    Allergies  Allergen Reactions  . Sulfa Antibiotics Rash    Past Surgical History:  Procedure Laterality Date  . CATARACT EXTRACTION W/PHACO Right 03/16/2018   Procedure: CATARACT EXTRACTION PHACO AND INTRAOCULAR LENS PLACEMENT (IOC);  Surgeon: Marchia Meiers, MD;  Location: [facility];  Service: Ophthalmology;  Laterality: Right;  [place]  01:05 CDE 11.85 Fluid pack lot # [id number]  . CHOLECYSTECTOMY    . COLONOSCOPY  2015   Dr Allen Norris- cleared for 5 years   . COLONOSCOPY WITH PROPOFOL N/A 01/17/2018   Procedure: COLONOSCOPY WITH PROPOFOL;  Surgeon: Lucilla Lame, MD;  Location: [place];  Service: Endoscopy;  Laterality: N/A;  requests early  . INSERTION OF MESH N/A 08/31/2017   Procedure: INSERTION OF MESH;  Surgeon: Vickie Epley, MD;  Location: [facility];  Service: General;  Laterality: N/A;  . POLYPECTOMY  01/17/2018   Procedure: POLYPECTOMY;  Surgeon: Lucilla Lame, MD;  Location: [place];  Service: Endoscopy;;  . UMBILICAL HERNIA REPAIR N/A 08/31/2017   Procedure: HERNIA REPAIR UMBILICAL ADULT;  Surgeon: Vickie Epley, MD;  Location: [facility];  Service: General;  Laterality: N/A;  . VEIN SURGERY     2 stents in R) leg    Social History   Tobacco Use  . Smoking status: Current Every Day Smoker    Packs/day: 1.50    Years: 50.00    Pack years: 75.00  . Smokeless tobacco: Never Used  .  Tobacco comment: pt wants to try patches again but states they are too costly  Substance Use Topics  . Alcohol use: Yes    Alcohol/week: 1.0 standard drinks    Types: 1 Cans of beer per week  . Drug use: No     Medication list has been reviewed and updated.   Current Meds  Medication Sig  . acetaminophen (TYLENOL) 325 MG tablet Take 325 mg by mouth daily as needed for moderate pain or headache.  . albuterol (VENTOLIN HFA) 108 (90 Base) MCG/ACT inhaler Inhale 2 puffs into the lungs every 6 (six) hours as needed for wheezing or shortness of breath.  . cetirizine (ZYRTEC) 10 MG tablet TAKE 1 TABLET BY MOUTH EVERY DAY  . clonazePAM (KLONOPIN) 0.5 MG tablet Take 0.5 tablets (0.25 mg total) by mouth daily. Take one half daily  . clopidogrel (PLAVIX) 75 MG tablet Take 1 tablet (75 mg total) by mouth daily.  Marland Kitchen dexlansoprazole (DEXILANT) 60 MG capsule Take 1 capsule (60 mg total) by mouth daily.  . fluticasone (FLONASE) 50 MCG/ACT nasal spray SPRAY 2 SPRAYS INTO EACH NOSTRIL EVERY DAY  . ibuprofen (ADVIL,MOTRIN) 200 MG tablet Take 400 mg by mouth every 8 (eight) hours as needed (for pain).  Marland Kitchen levothyroxine (SYNTHROID) 100 MCG tablet Take 1 tablet (100 mcg total) by mouth daily before breakfast.  . losartan (COZAAR) 25 MG tablet Take 2 tablets (50 mg total) by mouth daily.  . rosuvastatin (CRESTOR) 20 MG tablet Take 1 tablet (20 mg total) by mouth daily.  . sertraline (ZOLOFT) 100 MG tablet Take 1 tablet (100 mg total) by mouth daily.    PHQ 2/9 Scores 11/11/2018 08/29/2018 06/17/2018 06/01/2018  PHQ - 2 Score 0 0 0 0  PHQ- 9 Score 0 0 0 -    BP Readings from Last 3 Encounters:  11/21/18 130/84  11/11/18 130/80  08/29/18 120/70    Physical Exam Vitals signs and nursing note reviewed.  HENT:     Head: Normocephalic.     Right Ear: Tympanic membrane, ear canal and external ear normal.     Left Ear: Tympanic membrane, ear canal and external ear normal.     Nose: Nose normal. No congestion or rhinorrhea.  Eyes:     General: No scleral icterus.       Right eye: No discharge.        Left eye: No discharge.     Conjunctiva/sclera: Conjunctivae normal.     Pupils: Pupils are equal, round, and reactive to light.  Neck:     Musculoskeletal: Normal range  of motion and neck supple.     Thyroid: No thyromegaly.     Vascular: No JVD.     Trachea: No tracheal deviation.  Cardiovascular:     Rate and Rhythm: Normal rate and regular rhythm.     Heart sounds: Normal heart sounds. No murmur. No friction rub. No gallop.   Pulmonary:     Effort: No respiratory distress.     Breath sounds: Normal breath sounds. No wheezing, rhonchi or rales.  Abdominal:     General: Bowel sounds are normal.     Palpations: Abdomen is soft. There is no mass.     Tenderness: There is no abdominal tenderness. There is no guarding or rebound.  Musculoskeletal: Normal range of motion.        General: No tenderness.  Lymphadenopathy:     Cervical: No cervical adenopathy.  Skin:    General: Skin is warm.  Findings: No rash.  Neurological:     Mental Status: He is alert and oriented to person, place, and time.     Cranial Nerves: No cranial nerve deficit.     Deep Tendon Reflexes: Reflexes are normal and symmetric.     Wt Readings from Last 3 Encounters:  11/21/18 168 lb (76.2 kg)  11/11/18 168 lb (76.2 kg)  08/29/18 170 lb (77.1 kg)    BP 130/84   Pulse 80   Temp 97.9 F (36.6 C) (Oral)   Ht 5\' 7"  (1.702 m)   Wt 168 lb (76.2 kg)   BMI 26.31 kg/m   Assessment and Plan:  1. COPD with acute exacerbation (HCC) Chronic.  Patient with acute exacerbation.  Partially treated with a Z-Pak will initiate doxycycline 100 mg twice a day.  Will check a chest x-ray to evaluate for pneumonia.  Will initiate prednisone at 10 mg once a day.  Will control cough with Robitussin-AC 1 teaspoon every 6 hours.  Patient was given a sample of Symbicort to be used twice a day along with rescue albuterol inhaler - doxycycline (VIBRA-TABS) 100 MG tablet; Take 1 tablet (100 mg total) by mouth 2 (two) times daily.  Dispense: 20 tablet; Refill: 0 - DG Chest 2 View; Future - predniSONE (DELTASONE) 10 MG tablet; Take 1 tablet (10 mg total) by mouth daily with breakfast.  Dispense:  30 tablet; Refill: 0 - guaiFENesin-codeine (ROBITUSSIN AC) 100-10 MG/5ML syrup; Take 5 mLs by mouth 4 (four) times daily as needed for cough.  Dispense: 118 mL; Refill: 0

## 2019-01-18 ENCOUNTER — Ambulatory Visit (INDEPENDENT_AMBULATORY_CARE_PROVIDER_SITE_OTHER): Payer: Medicare Other

## 2019-01-18 ENCOUNTER — Other Ambulatory Visit: Payer: Self-pay

## 2019-01-18 DIAGNOSIS — Z23 Encounter for immunization: Secondary | ICD-10-CM

## 2019-01-26 ENCOUNTER — Other Ambulatory Visit: Payer: Self-pay | Admitting: Family Medicine

## 2019-02-16 ENCOUNTER — Telehealth: Payer: Self-pay

## 2019-02-16 NOTE — Telephone Encounter (Signed)
Had a fall in middle night getting up to  fast to urinate. Wife thinks it was BP drop and he needed to sit up for bit first. He denies injury. Advised if happens again to call and schedule appt as per PCP. No symptoms otherwise at this time.

## 2019-04-18 ENCOUNTER — Telehealth: Payer: Self-pay

## 2019-04-18 NOTE — Telephone Encounter (Signed)
Pt is having shoulder pain/ arthritis in shoulder. He wanted to know where he could go for further evaluation and possibly an injection of the shoulder. I gave him the number to [facility] ortho and told him he may need the referral from Dr Ronnald Ramp. He will try to call them first and then call us back to sched appt if needs referral.

## 2019-05-14 ENCOUNTER — Other Ambulatory Visit: Payer: Self-pay | Admitting: Family Medicine

## 2019-05-14 DIAGNOSIS — F3342 Major depressive disorder, recurrent, in full remission: Secondary | ICD-10-CM

## 2019-05-15 ENCOUNTER — Ambulatory Visit: Payer: Medicare PPO | Admitting: Family Medicine

## 2019-05-15 ENCOUNTER — Encounter: Payer: Self-pay | Admitting: Family Medicine

## 2019-05-15 ENCOUNTER — Other Ambulatory Visit: Payer: Self-pay

## 2019-05-15 VITALS — BP 130/80 | HR 80 | Ht 67.0 in | Wt 174.0 lb

## 2019-05-15 DIAGNOSIS — J301 Allergic rhinitis due to pollen: Secondary | ICD-10-CM | POA: Diagnosis not present

## 2019-05-15 DIAGNOSIS — I10 Essential (primary) hypertension: Secondary | ICD-10-CM

## 2019-05-15 DIAGNOSIS — F419 Anxiety disorder, unspecified: Secondary | ICD-10-CM

## 2019-05-15 DIAGNOSIS — F3342 Major depressive disorder, recurrent, in full remission: Secondary | ICD-10-CM

## 2019-05-15 DIAGNOSIS — E785 Hyperlipidemia, unspecified: Secondary | ICD-10-CM

## 2019-05-15 DIAGNOSIS — I700 Atherosclerosis of aorta: Secondary | ICD-10-CM

## 2019-05-15 DIAGNOSIS — E039 Hypothyroidism, unspecified: Secondary | ICD-10-CM

## 2019-05-15 DIAGNOSIS — K219 Gastro-esophageal reflux disease without esophagitis: Secondary | ICD-10-CM | POA: Diagnosis not present

## 2019-05-15 DIAGNOSIS — J209 Acute bronchitis, unspecified: Secondary | ICD-10-CM

## 2019-05-15 DIAGNOSIS — J440 Chronic obstructive pulmonary disease with acute lower respiratory infection: Secondary | ICD-10-CM

## 2019-05-15 DIAGNOSIS — E7801 Familial hypercholesterolemia: Secondary | ICD-10-CM | POA: Diagnosis not present

## 2019-05-15 MED ORDER — SERTRALINE HCL 100 MG PO TABS: 100.0000 mg | ORAL_TABLET | Freq: Every day | ORAL | 1 refills | Status: DC

## 2019-05-15 MED ORDER — LEVOTHYROXINE SODIUM 100 MCG PO TABS: 100.0000 ug | ORAL_TABLET | Freq: Every day | ORAL | 1 refills | Status: DC

## 2019-05-15 MED ORDER — DEXILANT 60 MG PO CPDR: 1.0000 | DELAYED_RELEASE_CAPSULE | Freq: Every day | ORAL | 1 refills | Status: DC

## 2019-05-15 MED ORDER — CLONAZEPAM 0.5 MG PO TABS: 0.2500 mg | ORAL_TABLET | Freq: Every day | ORAL | 5 refills | Status: DC

## 2019-05-15 MED ORDER — CETIRIZINE HCL 10 MG PO TABS: 10.0000 mg | ORAL_TABLET | Freq: Every day | ORAL | 3 refills | Status: DC

## 2019-05-15 MED ORDER — ALBUTEROL SULFATE HFA 108 (90 BASE) MCG/ACT IN AERS: 2.0000 | INHALATION_SPRAY | Freq: Four times a day (QID) | RESPIRATORY_TRACT | 11 refills | Status: DC | PRN

## 2019-05-15 MED ORDER — CLOPIDOGREL BISULFATE 75 MG PO TABS: 75.0000 mg | ORAL_TABLET | Freq: Every day | ORAL | 1 refills | Status: DC

## 2019-05-15 MED ORDER — ROSUVASTATIN CALCIUM 20 MG PO TABS: 20.0000 mg | ORAL_TABLET | Freq: Every day | ORAL | 1 refills | Status: DC

## 2019-05-15 MED ORDER — LOSARTAN POTASSIUM 25 MG PO TABS: 50.0000 mg | ORAL_TABLET | Freq: Every day | ORAL | 1 refills | Status: DC

## 2019-05-15 NOTE — Progress Notes (Addendum)
Date:  05/15/2019   Name:  Joel Flores   DOB:  [birth year]   MRN:  [id number]   Chief Complaint: Allergic Rhinitis , Hypertension, Coronary Artery Disease, Depression (PHQ9=4 and GAD7=3), Hyperlipidemia, Hypothyroidism, and Anxiety  Hypertension This is a chronic problem. The current episode started more than 1 year ago. The problem has been gradually worsening since onset. The problem is controlled. Associated symptoms include anxiety. Pertinent negatives include no blurred vision, chest pain, headaches, malaise/fatigue, neck pain, orthopnea, palpitations, peripheral edema, PND, shortness of breath or sweats. There are no associated agents to hypertension. There are no known risk factors for coronary artery disease. Past treatments include angiotensin blockers. The current treatment provides moderate improvement. There are no compliance problems.  Hypertensive end-organ damage includes CAD/MI. There is no history of angina, kidney disease, CVA, heart failure, left ventricular hypertrophy, PVD or retinopathy. Identifiable causes of hypertension include a thyroid problem. There is no history of chronic renal disease, a hypertension causing med or renovascular disease.  Coronary Artery Disease Presents for follow-up visit. Pertinent negatives include no chest pain, chest pressure, chest tightness, dizziness, leg swelling, muscle weakness, palpitations, shortness of breath or weight gain. Risk factors include hyperlipidemia and hypertension. Risk factors do not include obesity. The symptoms have been stable.  Depression        This is a chronic problem.  The current episode started more than 1 year ago.   The onset quality is gradual.   The problem occurs intermittently.  The problem has been gradually improving since onset.  Associated symptoms include irritable and restlessness.  Associated symptoms include no fatigue, no helplessness, no hopelessness, does not have insomnia, no decreased interest, no  myalgias, no headaches, not sad and no suicidal ideas.     The symptoms are aggravated by medication.  Past treatments include SSRIs - Selective serotonin reuptake inhibitors.  Compliance with treatment is good.  Previous treatment provided moderate relief.  Past medical history includes thyroid problem and anxiety.     Pertinent negatives include no hypothyroidism. Hyperlipidemia This is a chronic problem. The current episode started more than 1 year ago. The problem is controlled. Recent lipid tests were reviewed and are normal. He has no history of chronic renal disease, diabetes, hypothyroidism, liver disease, obesity or nephrotic syndrome. Pertinent negatives include no chest pain, focal sensory loss, focal weakness, leg pain, myalgias or shortness of breath. Current antihyperlipidemic treatment includes statins. The current treatment provides moderate improvement of lipids. There are no compliance problems.  Risk factors for coronary artery disease include dyslipidemia and hypertension.  Anxiety Presents for follow-up visit. Symptoms include excessive worry, irritability, nervous/anxious behavior, panic and restlessness. Patient reports no chest pain, depressed mood, dizziness, insomnia, nausea, palpitations, shortness of breath or suicidal ideas. Symptoms occur occasionally. The severity of symptoms is mild. The quality of sleep is fair.   His past medical history is significant for CAD.  Thyroid Problem Presents for follow-up visit. Symptoms include anxiety. Patient reports no cold intolerance, constipation, depressed mood, diaphoresis, diarrhea, dry skin, fatigue, hair loss, heat intolerance, hoarse voice, leg swelling, palpitations, visual change or weight gain. The symptoms have been improving. His past medical history is significant for hyperlipidemia. There is no history of diabetes or heart failure.    Lab Results  Component Value Date   CREATININE 0.81 06/17/2018   BUN 15 06/17/2018    NA 140 06/17/2018   K 4.4 06/17/2018   CL 102 06/17/2018   CO2 21 06/17/2018  Lab Results  Component Value Date   CHOL 122 06/17/2018   HDL 33 (L) 06/17/2018   LDLCALC 49 06/17/2018   TRIG 200 (H) 06/17/2018   CHOLHDL 3.7 06/17/2018   Lab Results  Component Value Date   TSH 3.040 06/17/2018   No results found for: HGBA1C   Review of Systems  Constitutional: Positive for irritability. Negative for chills, diaphoresis, fatigue, fever, malaise/fatigue and weight gain.  HENT: Negative for drooling, ear discharge, ear pain, hoarse voice and sore throat.   Eyes: Negative for blurred vision.  Respiratory: Negative for cough, chest tightness, shortness of breath and wheezing.   Cardiovascular: Negative for chest pain, palpitations, orthopnea, leg swelling and PND.  Gastrointestinal: Negative for abdominal pain, blood in stool, constipation, diarrhea and nausea.  Endocrine: Negative for cold intolerance, heat intolerance and polydipsia.  Genitourinary: Negative for dysuria, frequency, hematuria and urgency.  Musculoskeletal: Negative for back pain, myalgias, muscle weakness and neck pain.  Skin: Negative for rash.  Allergic/Immunologic: Negative for environmental allergies.  Neurological: Negative for dizziness, focal weakness and headaches.  Hematological: Does not bruise/bleed easily.  Psychiatric/Behavioral: Positive for depression. Negative for suicidal ideas. The patient is nervous/anxious. The patient does not have insomnia.     Patient Active Problem List   Diagnosis Date Noted  . Colon cancer screening   . Polyp of sigmoid colon   . SOBOE (shortness of breath on exertion) 07/30/2017  . PVD (peripheral vascular disease) ([place]) 11/17/2016  . Tobacco use disorder 11/17/2016  . Reactive airway disease, mild intermittent, uncomplicated [id number]  . Allergic rhinitis due to pollen 08/13/2015  . Nocturia 08/13/2015  . Atherosclerosis of aorta ([place]) 02/12/2015  . Essential  hypertension 02/12/2015  . Esophageal reflux 02/12/2015  . Depression 02/12/2015  . Adult hypothyroidism 02/12/2015  . Chronic anxiety 02/12/2015  . Hyperlipemia 02/12/2015    Allergies  Allergen Reactions  . Sulfa Antibiotics Rash    Past Surgical History:  Procedure Laterality Date  . CATARACT EXTRACTION W/PHACO Right 03/16/2018   Procedure: CATARACT EXTRACTION PHACO AND INTRAOCULAR LENS PLACEMENT (IOC);  Surgeon: Marchia Meiers, MD;  Location: [facility];  Service: Ophthalmology;  Laterality: Right;  [place]  01:05 CDE 11.85 Fluid pack lot # [id number]  . CHOLECYSTECTOMY    . COLONOSCOPY  2015   Dr Allen Norris- cleared for 5 years   . COLONOSCOPY WITH PROPOFOL N/A 01/17/2018   Procedure: COLONOSCOPY WITH PROPOFOL;  Surgeon: Lucilla Lame, MD;  Location: [place];  Service: Endoscopy;  Laterality: N/A;  requests early  . INSERTION OF MESH N/A 08/31/2017   Procedure: INSERTION OF MESH;  Surgeon: Vickie Epley, MD;  Location: [facility];  Service: General;  Laterality: N/A;  . POLYPECTOMY  01/17/2018   Procedure: POLYPECTOMY;  Surgeon: Lucilla Lame, MD;  Location: [place];  Service: Endoscopy;;  . UMBILICAL HERNIA REPAIR N/A 08/31/2017   Procedure: HERNIA REPAIR UMBILICAL ADULT;  Surgeon: Vickie Epley, MD;  Location: [facility];  Service: General;  Laterality: N/A;  . VEIN SURGERY     2 stents in R) leg    Social History   Tobacco Use  . Smoking status: Current Every Day Smoker    Packs/day: 1.50    Years: 50.00    Pack years: 75.00  . Smokeless tobacco: Never Used  . Tobacco comment: pt wants to try patches again but states they are too costly  Substance Use Topics  . Alcohol use: Yes    Alcohol/week: 1.0 standard drinks    Types:  1 Cans of beer per week  . Drug use: No     Medication list has been reviewed and updated.  Current Meds  Medication Sig  . acetaminophen (TYLENOL) 325 MG tablet Take 325 mg by mouth daily as needed for moderate pain or headache.   . albuterol (VENTOLIN HFA) 108 (90 Base) MCG/ACT inhaler Inhale 2 puffs into the lungs every 6 (six) hours as needed for wheezing or shortness of breath.  . cetirizine (ZYRTEC) 10 MG tablet TAKE 1 TABLET BY MOUTH EVERY DAY  . clonazePAM (KLONOPIN) 0.5 MG tablet Take 0.5 tablets (0.25 mg total) by mouth daily. Take one half daily  . clopidogrel (PLAVIX) 75 MG tablet Take 1 tablet (75 mg total) by mouth daily.  Marland Kitchen dexlansoprazole (DEXILANT) 60 MG capsule Take 1 capsule (60 mg total) by mouth daily.  . fluticasone (FLONASE) 50 MCG/ACT nasal spray SPRAY 2 SPRAYS INTO EACH NOSTRIL EVERY DAY  . ibuprofen (ADVIL,MOTRIN) 200 MG tablet Take 400 mg by mouth every 8 (eight) hours as needed (for pain).  Marland Kitchen levothyroxine (SYNTHROID) 100 MCG tablet Take 1 tablet (100 mcg total) by mouth daily before breakfast.  . losartan (COZAAR) 25 MG tablet Take 2 tablets (50 mg total) by mouth daily.  . rosuvastatin (CRESTOR) 20 MG tablet Take 1 tablet (20 mg total) by mouth daily.  . sertraline (ZOLOFT) 100 MG tablet TAKE 1 TABLET BY MOUTH EVERY DAY  . [DISCONTINUED] doxycycline (VIBRA-TABS) 100 MG tablet Take 1 tablet (100 mg total) by mouth 2 (two) times daily.    PHQ 2/9 Scores 05/15/2019 11/11/2018 08/29/2018 06/17/2018  PHQ - 2 Score 0 0 0 0  PHQ- 9 Score 4 0 0 0    BP Readings from Last 3 Encounters:  05/15/19 130/80  11/21/18 130/84  11/11/18 130/80    Physical Exam Vitals and nursing note reviewed.  Constitutional:      General: He is irritable.  HENT:     Head: Normocephalic.     Right Ear: External ear normal.     Left Ear: External ear normal.     Nose: Nose normal.  Eyes:     General: No scleral icterus.       Right eye: No discharge.        Left eye: No discharge.     Conjunctiva/sclera: Conjunctivae normal.     Pupils: Pupils are equal, round, and reactive to light.  Neck:     Thyroid: No thyromegaly.     Vascular: No JVD.     Trachea: No tracheal deviation.  Cardiovascular:     Rate and  Rhythm: Normal rate and regular rhythm.     Heart sounds: Normal heart sounds, S1 normal and S2 normal. No murmur. No systolic murmur. No diastolic murmur. No friction rub. No gallop. No S3 or S4 sounds.   Pulmonary:     Effort: No respiratory distress.     Breath sounds: Normal breath sounds. No decreased breath sounds, wheezing, rhonchi or rales.  Abdominal:     General: Bowel sounds are normal.     Palpations: Abdomen is soft. There is no mass.     Tenderness: There is no abdominal tenderness. There is no guarding or rebound.  Musculoskeletal:        General: No tenderness. Normal range of motion.     Cervical back: Normal range of motion and neck supple.     Right lower leg: No edema.     Left lower leg: No edema.  Lymphadenopathy:  Cervical: No cervical adenopathy.  Skin:    General: Skin is warm.     Findings: No rash.  Neurological:     Mental Status: He is alert and oriented to person, place, and time.     Cranial Nerves: No cranial nerve deficit.     Deep Tendon Reflexes: Reflexes are normal and symmetric.     Wt Readings from Last 3 Encounters:  05/15/19 174 lb (78.9 kg)  11/21/18 168 lb (76.2 kg)  11/11/18 168 lb (76.2 kg)    BP 130/80   Pulse 80   Ht 5\' 7"  (1.702 m)   Wt 174 lb (78.9 kg)   BMI 27.25 kg/m   Assessment and Plan: 1. Non-seasonal allergic rhinitis due to pollen Chronic.  Controlled.  Stable.  Continue Zyrtec 1 a day.  And patient uses his Flonase on a as needed basis.  2. Acute bronchitis with chronic obstructive pulmonary disease (COPD) (HCC) Chronic.  Controlled.  Stable.  The patient uses his albuterol rescue inhaler on a as needed basis 2 puffs every 6 hours as needed. - albuterol (VENTOLIN HFA) 108 (90 Base) MCG/ACT inhaler; Inhale 2 puffs into the lungs every 6 (six) hours as needed for wheezing or shortness of breath.  Dispense: 8 g; Refill: 11  3. Adult hypothyroidism Chronic.  Controlled.  Stable.  Will check thyroid function panel  with TSH and continue with Synthroid probably at current dosing 100 mcg daily. - Thyroid Panel With TSH - levothyroxine (SYNTHROID) 100 MCG tablet; Take 1 tablet (100 mcg total) by mouth daily before breakfast.  Dispense: 90 tablet; Refill: 1  4. Atherosclerosis of aorta [facility]) Patient with history of atherosclerosis.  We will control this with lipid control/Crestor 20 mg once a day and Plavix 75 mg once a day.  Will check his lipid panel for evaluation. - Lipid Panel With LDL/HDL Ratio - rosuvastatin (CRESTOR) 20 MG tablet; Take 1 tablet (20 mg total) by mouth daily.  Dispense: 90 tablet; Refill: 1 - clopidogrel (PLAVIX) 75 MG tablet; Take 1 tablet (75 mg total) by mouth daily.  Dispense: 90 tablet; Refill: 1  5. Chronic anxiety Chronic.  Controlled.  Stable.  With an occasional panic concern during the day patient is controlled on sertraline as well as clonazepam 1/2 tablet 0.25 mg daily.  Gad score was noted to be 3. - clonazePAM (KLONOPIN) 0.5 MG tablet; Take 0.5 tablets (0.25 mg total) by mouth daily. Take one half daily  Dispense: 15 tablet; Refill: 5  6. Essential hypertension Chronic.  Controlled.  Stable.  Continue losartan 25 mg 2 tablets daily.  We will check CMP for evaluation of GFR. - Comprehensive Metabolic Panel (CMET) - losartan (COZAAR) 25 MG tablet; Take 2 tablets (50 mg total) by mouth daily.  Dispense: 180 tablet; Refill: 1  7. Gastroesophageal reflux disease, unspecified whether esophagitis present Chronic.  Controlled.  Stable.  Continue Dexilant 60 mg once a day. - dexlansoprazole (DEXILANT) 60 MG capsule; Take 1 capsule (60 mg total) by mouth daily.  Dispense: 90 capsule; Refill: 1  8. Recurrent major depressive disorder, in full remission ([place]) Chronic.  Controlled.  Stable.  Patient's PHQ score was 4.  Continue sertraline 100 mg daily. - sertraline (ZOLOFT) 100 MG tablet; Take 1 tablet (100 mg total) by mouth daily.  Dispense: 90 tablet; Refill: 1  9.  Hyperlipidemia, unspecified hyperlipidemia type Chronic.  Controlled.  Stable.  For the most part patient has control of his LDL on Crestor.  We will continue  to monitor this with lipid panel and ratio. - Lipid Panel With LDL/HDL Ratio  Addendum: Patient will be returning in 4 to 6 weeks for formal evaluation of concentration/memory concerns and possible gait concerning since he had a fall however this was in the dark and there may been disorientation.

## 2019-05-16 ENCOUNTER — Other Ambulatory Visit: Payer: Self-pay

## 2019-05-16 DIAGNOSIS — E039 Hypothyroidism, unspecified: Secondary | ICD-10-CM

## 2019-05-16 LAB — LIPID PANEL WITH LDL/HDL RATIO
Cholesterol, Total: 141 mg/dL (ref 100–199)
HDL: 48 mg/dL (ref >39)
LDL Chol Calc (NIH): 72 mg/dL (ref 0–99)
LDL/HDL Ratio: 1.5 ratio (ref 0.0–3.6)
Triglycerides: 118 mg/dL (ref 0–149)
VLDL Cholesterol Cal: 21 mg/dL (ref 5–40)

## 2019-05-16 LAB — COMPREHENSIVE METABOLIC PANEL
ALT: 21 IU/L (ref 0–44)
AST: 22 IU/L (ref 0–40)
Albumin/Globulin Ratio: 1.8 (ref 1.2–2.2)
Albumin: 4.4 g/dL (ref 3.8–4.8)
Alkaline Phosphatase: 94 IU/L (ref 39–117)
BUN/Creatinine Ratio: 24 (ref 10–24)
BUN: 18 mg/dL (ref 8–27)
Bilirubin Total: 0.3 mg/dL (ref 0.0–1.2)
CO2: 22 mmol/L (ref 20–29)
Calcium: 9.4 mg/dL (ref 8.6–10.2)
Chloride: 103 mmol/L (ref 96–106)
Creatinine, Ser: 0.75 mg/dL — ABNORMAL LOW (ref 0.76–1.27)
GFR calc Af Amer: 108 mL/min/{1.73_m2} (ref >59)
GFR calc non Af Amer: 94 mL/min/{1.73_m2} (ref >59)
Globulin, Total: 2.5 g/dL (ref 1.5–4.5)
Glucose: 81 mg/dL (ref 65–99)
Potassium: 4.8 mmol/L (ref 3.5–5.2)
Sodium: 139 mmol/L (ref 134–144)
Total Protein: 6.9 g/dL (ref 6.0–8.5)

## 2019-05-16 LAB — THYROID PANEL WITH TSH
Free Thyroxine Index: 1.8 (ref 1.2–4.9)
T3 Uptake Ratio: 25 % (ref 24–39)
T4, Total: 7 ug/dL (ref 4.5–12.0)
TSH: 11.7 u[IU]/mL — ABNORMAL HIGH (ref 0.450–4.500)

## 2019-05-16 MED ORDER — LEVOTHYROXINE SODIUM 112 MCG PO TABS: 112.0000 ug | ORAL_TABLET | Freq: Every day | ORAL | 1 refills | Status: DC

## 2019-05-16 NOTE — Progress Notes (Unsigned)
Sent in 164mcg to pharm d/t labs

## 2019-06-15 ENCOUNTER — Telehealth: Payer: Self-pay | Admitting: *Deleted

## 2019-06-15 DIAGNOSIS — Z87891 Personal history of nicotine dependence: Secondary | ICD-10-CM

## 2019-06-15 NOTE — Telephone Encounter (Signed)
(  06/15/19) Patient has been notified that lung cancer screening CT scan is due currently or will be in near future. Confirmed that patient is within the appropriate age range, and asymptomatic. Patient denies illness that would prevent curative treatment for lung cancer if found. Verified smoking history (Current Smoker,1.5 ppd ). Patient is agreeable for CT scan being scheduled, has no preference for time or date SRW

## 2019-06-16 NOTE — Telephone Encounter (Signed)
Smoking history: current, 76.5 pack year

## 2019-06-16 NOTE — Addendum Note (Signed)
Addended by: Lieutenant Diego on: 06/16/2019 11:29 AM   Modules accepted: Orders

## 2019-06-22 ENCOUNTER — Ambulatory Visit
Admission: RE | Admit: 2019-06-22 | Discharge: 2019-06-22 | Disposition: A | Discharge status: Home / Self Care | Payer: Medicare PPO | Source: Ambulatory Visit | Attending: Nurse Practitioner | Admitting: Nurse Practitioner

## 2019-06-22 ENCOUNTER — Other Ambulatory Visit: Payer: Self-pay

## 2019-06-22 DIAGNOSIS — Z87891 Personal history of nicotine dependence: Secondary | ICD-10-CM | POA: Diagnosis not present

## 2019-06-22 DIAGNOSIS — F17210 Nicotine dependence, cigarettes, uncomplicated: Secondary | ICD-10-CM | POA: Diagnosis not present

## 2019-06-26 ENCOUNTER — Encounter: Payer: Self-pay | Admitting: *Deleted

## 2019-06-26 ENCOUNTER — Ambulatory Visit: Payer: Medicare PPO | Admitting: Family Medicine

## 2019-06-26 ENCOUNTER — Other Ambulatory Visit: Payer: Self-pay

## 2019-06-26 ENCOUNTER — Encounter: Payer: Self-pay | Admitting: Family Medicine

## 2019-06-26 VITALS — BP 128/80 | HR 80 | Ht 67.5 in | Wt 172.0 lb

## 2019-06-26 DIAGNOSIS — E039 Hypothyroidism, unspecified: Secondary | ICD-10-CM

## 2019-06-26 DIAGNOSIS — F17200 Nicotine dependence, unspecified, uncomplicated: Secondary | ICD-10-CM | POA: Diagnosis not present

## 2019-06-26 DIAGNOSIS — R413 Other amnesia: Secondary | ICD-10-CM

## 2019-06-26 MED ORDER — LEVOTHYROXINE SODIUM 112 MCG PO TABS: 112.0000 ug | ORAL_TABLET | Freq: Every day | ORAL | 1 refills | Status: DC

## 2019-06-26 NOTE — Progress Notes (Signed)
Date:  06/26/2019   Name:  Joel Flores   DOB:  1950/09/02   MRN:  [id number]   Chief Complaint: Hypothyroidism (needs a recheck on thyroid)  Neurologic Problem The patient's primary symptoms include memory loss. The patient's pertinent negatives include no altered mental status, clumsiness, focal sensory loss, focal weakness, loss of balance, near-syncope, slurred speech, syncope, visual change or weakness. Primary symptoms comment: recent. This is a recurrent problem. The neurological problem developed gradually. The problem has been waxing and waning since onset. Pertinent negatives include no abdominal pain, auditory change, aura, back pain, chest pain, confusion, diaphoresis, dizziness, fatigue, fever, headaches, nausea, neck pain, palpitations or shortness of breath.  Thyroid Problem Presents for follow-up visit. Patient reports no anxiety, cold intolerance, constipation, depressed mood, diaphoresis, diarrhea, dry skin, fatigue, hair loss, heat intolerance, hoarse voice, leg swelling, nail problem, palpitations, tremors, visual change or weight gain. The symptoms have been improving.    Lab Results  Component Value Date   CREATININE 0.75 (L) 05/15/2019   BUN 18 05/15/2019   NA 139 05/15/2019   K 4.8 05/15/2019   CL 103 05/15/2019   CO2 22 05/15/2019   Lab Results  Component Value Date   CHOL 141 05/15/2019   HDL 48 05/15/2019   LDLCALC 72 05/15/2019   TRIG 118 05/15/2019   CHOLHDL 3.7 06/17/2018   Lab Results  Component Value Date   TSH 11.700 (H) 05/15/2019   No results found for: HGBA1C   Review of Systems  Constitutional: Negative for chills, diaphoresis, fatigue, fever and weight gain.  HENT: Negative for drooling, ear discharge, ear pain, hoarse voice and sore throat.   Respiratory: Negative for cough, shortness of breath and wheezing.   Cardiovascular: Negative for chest pain, palpitations, leg swelling and near-syncope.  Gastrointestinal: Negative for abdominal  pain, blood in stool, constipation, diarrhea and nausea.  Endocrine: Negative for cold intolerance, heat intolerance and polydipsia.  Genitourinary: Negative for dysuria, frequency, hematuria and urgency.  Musculoskeletal: Negative for back pain, myalgias and neck pain.  Skin: Negative for rash.  Allergic/Immunologic: Negative for environmental allergies.  Neurological: Negative for dizziness, tremors, focal weakness, syncope, weakness, headaches and loss of balance.  Hematological: Does not bruise/bleed easily.  Psychiatric/Behavioral: Positive for memory loss. Negative for confusion and suicidal ideas. The patient is not nervous/anxious.     Patient Active Problem List   Diagnosis Date Noted  . Colon cancer screening   . Polyp of sigmoid colon   . SOBOE (shortness of breath on exertion) 07/30/2017  . PVD (peripheral vascular disease) ([place]) 11/17/2016  . Tobacco use disorder 11/17/2016  . Reactive airway disease, mild intermittent, uncomplicated [id number]  . Allergic rhinitis due to pollen 08/13/2015  . Nocturia 08/13/2015  . Atherosclerosis of aorta ([place]) 02/12/2015  . Essential hypertension 02/12/2015  . Esophageal reflux 02/12/2015  . Depression 02/12/2015  . Adult hypothyroidism 02/12/2015  . Chronic anxiety 02/12/2015  . Hyperlipemia 02/12/2015    Allergies  Allergen Reactions  . Sulfa Antibiotics Rash    Past Surgical History:  Procedure Laterality Date  . CATARACT EXTRACTION W/PHACO Right 03/16/2018   Procedure: CATARACT EXTRACTION PHACO AND INTRAOCULAR LENS PLACEMENT (IOC);  Surgeon: Marchia Meiers, MD;  Location: [facility];  Service: Ophthalmology;  Laterality: Right;  [place]  01:05 CDE 11.85 Fluid pack lot # [id number]  . CHOLECYSTECTOMY    . COLONOSCOPY  2015   Dr Allen Norris- cleared for 5 years   . COLONOSCOPY WITH PROPOFOL N/A 01/17/2018   Procedure:  COLONOSCOPY WITH PROPOFOL;  Surgeon: Lucilla Lame, MD;  Location: [place];  Service: Endoscopy;  Laterality:  N/A;  requests early  . INSERTION OF MESH N/A 08/31/2017   Procedure: INSERTION OF MESH;  Surgeon: Vickie Epley, MD;  Location: [facility];  Service: General;  Laterality: N/A;  . POLYPECTOMY  01/17/2018   Procedure: POLYPECTOMY;  Surgeon: Lucilla Lame, MD;  Location: [facility];  Service: Endoscopy;;  . UMBILICAL HERNIA REPAIR N/A 08/31/2017   Procedure: HERNIA REPAIR UMBILICAL ADULT;  Surgeon: Vickie Epley, MD;  Location: [facility];  Service: General;  Laterality: N/A;  . VEIN SURGERY     2 stents in R) leg    Social History   Tobacco Use  . Smoking status: Current Every Day Smoker    Packs/day: 1.50    Years: 50.00    Pack years: 75.00  . Smokeless tobacco: Never Used  . Tobacco comment: pt wants to try patches again but states they are too costly  Substance Use Topics  . Alcohol use: Yes    Alcohol/week: 1.0 standard drinks    Types: 1 Cans of beer per week  . Drug use: No     Medication list has been reviewed and updated.  Current Meds  Medication Sig  . acetaminophen (TYLENOL) 325 MG tablet Take 325 mg by mouth daily as needed for moderate pain or headache.  . albuterol (VENTOLIN HFA) 108 (90 Base) MCG/ACT inhaler Inhale 2 puffs into the lungs every 6 (six) hours as needed for wheezing or shortness of breath.  . cetirizine (ZYRTEC) 10 MG tablet Take 1 tablet (10 mg total) by mouth daily.  . clonazePAM (KLONOPIN) 0.5 MG tablet Take 0.5 tablets (0.25 mg total) by mouth daily. Take one half daily  . clopidogrel (PLAVIX) 75 MG tablet Take 1 tablet (75 mg total) by mouth daily.  Marland Kitchen dexlansoprazole (DEXILANT) 60 MG capsule Take 1 capsule (60 mg total) by mouth daily.  . fluticasone (FLONASE) 50 MCG/ACT nasal spray SPRAY 2 SPRAYS INTO EACH NOSTRIL EVERY DAY  . ibuprofen (ADVIL,MOTRIN) 200 MG tablet Take 400 mg by mouth every 8 (eight) hours as needed (for pain).  Marland Kitchen levothyroxine (SYNTHROID) 112 MCG tablet Take 1 tablet (112 mcg total) by mouth daily.  Marland Kitchen losartan  (COZAAR) 25 MG tablet Take 2 tablets (50 mg total) by mouth daily.  . rosuvastatin (CRESTOR) 20 MG tablet Take 1 tablet (20 mg total) by mouth daily.  . sertraline (ZOLOFT) 100 MG tablet Take 1 tablet (100 mg total) by mouth daily.    PHQ 2/9 Scores 06/26/2019 05/15/2019 11/11/2018 08/29/2018  PHQ - 2 Score 0 0 0 0  PHQ- 9 Score 0 4 0 0    BP Readings from Last 3 Encounters:  06/26/19 128/80  05/15/19 130/80  11/21/18 130/84    Physical Exam Vitals and nursing note reviewed.  HENT:     Head: Normocephalic.     Right Ear: Tympanic membrane, ear canal and external ear normal.     Left Ear: Tympanic membrane, ear canal and external ear normal.     Nose: Nose normal.     Mouth/Throat:     Mouth: Mucous membranes are moist.  Eyes:     General: No scleral icterus.       Right eye: No discharge.        Left eye: No discharge.     Conjunctiva/sclera: Conjunctivae normal.     Pupils: Pupils are equal, round, and reactive to light.  Neck:     Thyroid: No thyromegaly.     Vascular: No JVD.     Trachea: No tracheal deviation.  Cardiovascular:     Rate and Rhythm: Normal rate and regular rhythm.     Heart sounds: Normal heart sounds. No murmur. No friction rub. No gallop.   Pulmonary:     Effort: No respiratory distress.     Breath sounds: Normal breath sounds. No wheezing, rhonchi or rales.  Abdominal:     General: Bowel sounds are normal.     Palpations: Abdomen is soft. There is no mass.     Tenderness: There is no abdominal tenderness. There is no guarding or rebound.  Musculoskeletal:        General: No tenderness. Normal range of motion.     Cervical back: Normal range of motion and neck supple.     Right lower leg: No edema.     Left lower leg: No edema.  Lymphadenopathy:     Cervical: No cervical adenopathy.  Skin:    General: Skin is warm.     Findings: No rash.  Neurological:     General: No focal deficit present.     Mental Status: He is alert and oriented to person,  place, and time.     Cranial Nerves: No cranial nerve deficit.     Motor: No weakness.     Deep Tendon Reflexes: Reflexes are normal and symmetric.     Wt Readings from Last 3 Encounters:  06/26/19 172 lb (78 kg)  06/22/19 172 lb (78 kg)  05/15/19 174 lb (78.9 kg)    BP 128/80   Pulse 80   Ht 5' 7.5" (1.715 m)   Wt 172 lb (78 kg)   BMI 26.54 kg/m   Assessment and Plan: 1. Memory changes Patient has noted some recent incidences that he is forgotten minor things.  And has misplaced some things as well and in the recent past.  But there is no significant indication of dementia at this point time.  Patient has been suggested some over-the-counter preparations such as Privigen as well as certain vitamins that may offer some advantage.  2. Tobacco use disorder Patient is considering more more about quitting smoking particularly of the cost aspect.Patient has been advised of the health risks of smoking and counseled concerning cessation of tobacco products. I spent over 3 minutes for discussion and to answer questions.  3. Adult hypothyroidism Recent TSH required that we increase the levothyroxine to 112 mcg a day.  We will check a TSH and for evaluation of TSH to determine whether we will continue with this dose. - TSH - levothyroxine (SYNTHROID) 112 MCG tablet; Take 1 tablet (112 mcg total) by mouth daily.  Dispense: 90 tablet; Refill: 1

## 2019-06-26 NOTE — Patient Instructions (Signed)
Dementia Dementia is a condition that affects the way the brain functions. It often affects memory and thinking. Usually, dementia gets worse with time and cannot be reversed (progressive dementia). There are many types of dementia, including:  Alzheimer's disease. This type is the most common.  Vascular dementia. This type may happen as the result of a stroke.  Lewy body dementia. This type may happen to people who have Parkinson's disease.  Frontotemporal dementia. This type is caused by damage to nerve cells (neurons) in certain parts of the brain. Some people may be affected by more than one type of dementia. This is called mixed dementia. What are the causes? Dementia is caused by damage to cells in the brain. The area of the brain and the types of cells damaged determine the type of dementia. Usually, this damage is irreversible or cannot be undone. Some examples of irreversible causes include:  Conditions that affect the blood vessels of the brain, such as diabetes, heart disease, or blood vessel disease.  Genetic mutations. In some cases, changes in the brain may be caused by another condition and can be reversed or slowed. Some examples of reversible causes include:  Injury to the brain.  Certain medicines.  Infection, such as meningitis.  Metabolic problems, such as vitamin B12 deficiency or thyroid disease.  Pressure on the brain, such as from a tumor or blood clot. What are the signs or symptoms? Symptoms of dementia depend on the type of dementia. Common signs of dementia include problems with remembering, thinking, problem solving, decision making, and communicating. These signs develop slowly or get worse with time. This may include:  Problems remembering things.  Having trouble taking a bath or putting clothes on.  Forgetting appointments.  Forgetting to pay bills.  Difficulty planning and preparing meals.  Having trouble speaking.  Getting lost easily. How  is this diagnosed? This condition is diagnosed by a specialist (neurologist). It is diagnosed based on the history of your symptoms, your medical history, a physical exam, and tests. Tests may include:  Tests to evaluate brain function, such as memory tests, cognitive tests, and other tests.  Lab tests, such as blood or urine tests.  Imaging tests, such as a CT scan, a PET scan, or an MRI.  Genetic testing. This may be done if other family members have a diagnosis of certain types of dementia. Your health care provider will talk with you and your family, friends, or caregivers about your history and symptoms. How is this treated?  Treatment for this condition depends on the cause of the dementia. Progressive dementias, such as Alzheimer's disease, cannot be cured, but there may be treatments that help to manage symptoms. Treatment might involve taking medicines that may help to:  Control the dementia.  Slow down the progression of the dementia.  Manage symptoms. In some cases, treating the cause of your dementia can improve symptoms, reverse symptoms, or slow down how quickly your dementia becomes worse. Your health care provider can direct you to support groups, organizations, and other health care providers who can help with decisions about your care. Follow these instructions at home: Medicines  Take over-the-counter and prescription medicines only as told by your health care provider.  Use a pill organizer or pill reminder to help you manage your medicines.  Avoid taking medicines that can affect thinking, such as pain medicines or sleeping medicines. Lifestyle  Make healthy lifestyle choices. ? Be physically active as told by your health care provider. ? Do   not use any products that contain nicotine or tobacco, such as cigarettes, e-cigarettes, and chewing tobacco. If you need help quitting, ask your health care provider. ? Do not drink alcohol. ? Practice stress-management  techniques when you get stressed. ? Spend time with other people.  Make sure to get quality sleep. These tips can help you get a good night's rest: ? Avoid napping during the day. ? Keep your sleeping area dark and cool. ? Avoid exercising during the few hours before you go to bed. ? Avoid caffeine products in the evening. Eating and drinking  Drink enough fluid to keep your urine pale yellow.  Eat a healthy diet. General instructions   Work with your health care provider to determine what you need help with and what your safety needs are.  Talk with your health care provider about whether it is safe for you to drive.  If you were given a bracelet that identifies you as a person with memory loss or tracks your location, make sure to wear it at all times.  Work with your family to make important decisions, such as advance directives, medical power of attorney, or a living will.  Keep all follow-up visits as told by your health care provider. This is important. Where to find more information  Alzheimer's Association: www.alz.org  National Institute on Aging: www.nia.nih.gov/alzheimers  World Health Organization: www.who.int Contact a health care provider if:  You have any new or worsening symptoms.  You have problems with choking or swallowing. Get help right away if:  You feel depressed or sad, or feel that you want to harm yourself.  Your family members become concerned for your safety. If you ever feel like you may hurt yourself or others, or have thoughts about taking your own life, get help right away. You can go to your nearest emergency department or call:  Your local emergency services (911 in the [place]  A suicide crisis helpline, such as the National Suicide Prevention Lifeline at 1-800-273-8255. This is open 24 hours a day. Summary  Dementia is a condition that affects the way the brain functions. Dementia often affects memory and thinking.  Usually,  dementia gets worse with time and cannot be reversed (progressive dementia).  Treatment for this condition depends on the cause of the dementia.  Work with your health care provider to determine what you need help with and what your safety needs are.  Your health care provider can direct you to support groups, organizations, and other health care providers who can help with decisions about your care. This information is not intended to replace advice given to you by your health care provider. Make sure you discuss any questions you have with your health care provider. Document Revised: 06/28/2018 Document Reviewed: 06/28/2018 [employer] Patient Education  2020 [employer]  

## 2019-06-27 LAB — TSH: TSH: 4.31 u[IU]/mL (ref 0.450–4.500)

## 2019-07-24 ENCOUNTER — Ambulatory Visit (INDEPENDENT_AMBULATORY_CARE_PROVIDER_SITE_OTHER): Payer: Medicare PPO

## 2019-07-24 VITALS — BP 130/90 | Ht 68.0 in | Wt 172.0 lb

## 2019-07-24 DIAGNOSIS — Z0000 Encounter for general adult medical examination without abnormal findings: Secondary | ICD-10-CM

## 2019-07-24 NOTE — Patient Instructions (Signed)
Joel Flores , Thank you for taking time to come for your Medicare Wellness Visit. I appreciate your ongoing commitment to your health goals. Please review the following plan we discussed and let me know if I can assist you in the future.   Screening recommendations/referrals: Colonoscopy: done 01/17/18 Recommended yearly ophthalmology/optometry visit for glaucoma screening and checkup Recommended yearly dental visit for hygiene and checkup  Vaccinations: Influenza vaccine: done 01/18/19 Pneumococcal vaccine: done 12/20/17 Tdap vaccine: done 7/9/418 Shingles vaccine: Shingrix discussed. Please contact your pharmacy for coverage information.  Covid-19: done 05/26/19 & 06/20/19   Advanced directives: Advance directive discussed with you today. I have provided a copy for you to complete at home and have notarized. Once this is complete please bring a copy in to our office so we can scan it into your chart.  Conditions/risks identified: If you wish to quit smoking, help is available. For free tobacco cessation program offerings call the [facility] at ([phone] 245-8986 or Live Well Line at 816-311-9952. You may also visit [email or web address] or email [email or web address] for more information on other programs.   Next appointment: Please follow up in one year for your Medicare Annual Wellness visit.    Preventive Care 69 Years and Older, Male Preventive care refers to lifestyle choices and visits with your health care provider that can promote health and wellness. What does preventive care include?  A yearly physical exam. This is also called an annual well check.  Dental exams once or twice a year.  Routine eye exams. Ask your health care provider how often you should have your eyes checked.  Personal lifestyle choices, including:  Daily care of your teeth and gums.  Regular physical activity.  Eating a healthy diet.  Avoiding tobacco and drug use.  Limiting alcohol  use.  Practicing safe sex.  Taking low doses of aspirin every day.  Taking vitamin and mineral supplements as recommended by your health care provider. What happens during an annual well check? The services and screenings done by your health care provider during your annual well check will depend on your age, overall health, lifestyle risk factors, and family history of disease. Counseling  Your health care provider may ask you questions about your:  Alcohol use.  Tobacco use.  Drug use.  Emotional well-being.  Home and relationship well-being.  Sexual activity.  Eating habits.  History of falls.  Memory and ability to understand (cognition).  Work and work Statistician. Screening  You may have the following tests or measurements:  Height, weight, and BMI.  Blood pressure.  Lipid and cholesterol levels. These may be checked every 5 years, or more frequently if you are over 39 years old.  Skin check.  Lung cancer screening. You may have this screening every year starting at age 6 if you have a 30-pack-year history of smoking and currently smoke or have quit within the past 15 years.  Fecal occult blood test (FOBT) of the stool. You may have this test every year starting at age 6.  Flexible sigmoidoscopy or colonoscopy. You may have a sigmoidoscopy every 5 years or a colonoscopy every 10 years starting at age 63.  Prostate cancer screening. Recommendations will vary depending on your family history and other risks.  Hepatitis C blood test.  Hepatitis B blood test.  Sexually transmitted disease (STD) testing.  Diabetes screening. This is done by checking your blood sugar (glucose) after you have not eaten for a while (fasting). You may have this  done every 1-3 years.  Abdominal aortic aneurysm (AAA) screening. You may need this if you are a current or former smoker.  Osteoporosis. You may be screened starting at age 57 if you are at high risk. Talk with your  health care provider about your test results, treatment options, and if necessary, the need for more tests. Vaccines  Your health care provider may recommend certain vaccines, such as:  Influenza vaccine. This is recommended every year.  Tetanus, diphtheria, and acellular pertussis (Tdap, Td) vaccine. You may need a Td booster every 10 years.  Zoster vaccine. You may need this after age 62.  Pneumococcal 13-valent conjugate (PCV13) vaccine. One dose is recommended after age 10.  Pneumococcal polysaccharide (PPSV23) vaccine. One dose is recommended after age 66. Talk to your health care provider about which screenings and vaccines you need and how often you need them. This information is not intended to replace advice given to you by your health care provider. Make sure you discuss any questions you have with your health care provider. Document Released: 05/10/2015 Document Revised: 01/01/2016 Document Reviewed: 02/12/2015 Elsevier Interactive Patient Education  2017 [place] Prevention in the Home Falls can cause injuries. They can happen to people of all ages. There are many things you can do to make your home safe and to help prevent falls. What can I do on the outside of my home?  Regularly fix the edges of walkways and driveways and fix any cracks.  Remove anything that might make you trip as you walk through a door, such as a raised step or threshold.  Trim any bushes or trees on the path to your home.  Use bright outdoor lighting.  Clear any walking paths of anything that might make someone trip, such as rocks or tools.  Regularly check to see if handrails are loose or broken. Make sure that both sides of any steps have handrails.  Any raised decks and porches should have guardrails on the edges.  Have any leaves, snow, or ice cleared regularly.  Use sand or salt on walking paths during winter.  Clean up any spills in your garage right away. This includes oil  or grease spills. What can I do in the bathroom?  Use night lights.  Install grab bars by the toilet and in the tub and shower. Do not use towel bars as grab bars.  Use non-skid mats or decals in the tub or shower.  If you need to sit down in the shower, use a plastic, non-slip stool.  Keep the floor dry. Clean up any water that spills on the floor as soon as it happens.  Remove soap buildup in the tub or shower regularly.  Attach bath mats securely with double-sided non-slip rug tape.  Do not have throw rugs and other things on the floor that can make you trip. What can I do in the bedroom?  Use night lights.  Make sure that you have a light by your bed that is easy to reach.  Do not use any sheets or blankets that are too big for your bed. They should not hang down onto the floor.  Have a firm chair that has side arms. You can use this for support while you get dressed.  Do not have throw rugs and other things on the floor that can make you trip. What can I do in the kitchen?  Clean up any spills right away.  Avoid walking on wet floors.  Keep items that you use a lot in easy-to-reach places.  If you need to reach something above you, use a strong step stool that has a grab bar.  Keep electrical cords out of the way.  Do not use floor polish or wax that makes floors slippery. If you must use wax, use non-skid floor wax.  Do not have throw rugs and other things on the floor that can make you trip. What can I do with my stairs?  Do not leave any items on the stairs.  Make sure that there are handrails on both sides of the stairs and use them. Fix handrails that are broken or loose. Make sure that handrails are as long as the stairways.  Check any carpeting to make sure that it is firmly attached to the stairs. Fix any carpet that is loose or worn.  Avoid having throw rugs at the top or bottom of the stairs. If you do have throw rugs, attach them to the floor with  carpet tape.  Make sure that you have a light switch at the top of the stairs and the bottom of the stairs. If you do not have them, ask someone to add them for you. What else can I do to help prevent falls?  Wear shoes that:  Do not have high heels.  Have rubber bottoms.  Are comfortable and fit you well.  Are closed at the toe. Do not wear sandals.  If you use a stepladder:  Make sure that it is fully opened. Do not climb a closed stepladder.  Make sure that both sides of the stepladder are locked into place.  Ask someone to hold it for you, if possible.  Clearly mark and make sure that you can see:  Any grab bars or handrails.  First and last steps.  Where the edge of each step is.  Use tools that help you move around (mobility aids) if they are needed. These include:  Canes.  Walkers.  Scooters.  Crutches.  Turn on the lights when you go into a dark area. Replace any light bulbs as soon as they burn out.  Set up your furniture so you have a clear path. Avoid moving your furniture around.  If any of your floors are uneven, fix them.  If there are any pets around you, be aware of where they are.  Review your medicines with your doctor. Some medicines can make you feel dizzy. This can increase your chance of falling. Ask your doctor what other things that you can do to help prevent falls. This information is not intended to replace advice given to you by your health care provider. Make sure you discuss any questions you have with your health care provider. Document Released: 02/07/2009 Document Revised: 09/19/2015 Document Reviewed: 05/18/2014 Elsevier Interactive Patient Education  2017 [employer].

## 2019-07-24 NOTE — Progress Notes (Signed)
Subjective:   Dalonte Quiceno Mani is a 69 y.o. male who presents for Medicare Annual/Subsequent preventive examination.  Virtual Visit via Telephone Note  I connected with Chukwuebuka Zumbach Prochnow on 07/24/19 at 11:20 AM EDT by telephone and verified that I am speaking with the correct person using two identifiers.  Medicare Annual Wellness visit completed telephonically due to Covid-19 pandemic.   Location: Patient: home Provider: office   I discussed the limitations, risks, security and privacy concerns of performing an evaluation and management service by telephone and the availability of in person appointments. The patient expressed understanding and agreed to proceed.  Some vital signs may be absent or patient reported.   Clemetine Marker, LPN    Review of Systems:   Cardiac Risk Factors include: advanced age (>64men, >58 women);smoking/ tobacco exposure;hypertension;dyslipidemia;male gender     Objective:    Vitals: BP 130/90   Ht 5\' 8"  (1.727 m)   Wt 172 lb (78 kg)   BMI 26.15 kg/m   Body mass index is 26.15 kg/m.  Advanced Directives 07/24/2019 06/01/2018 03/16/2018 01/17/2018 12/04/2017 08/31/2017 08/24/2017  Does Patient Have a Medical Advance Directive? No No No No No Yes Yes  Type of Advance Directive - - - - - Catering manager;Living will  Does patient want to make changes to medical advance directive? - - - - - No - Patient declined -  Copy of [place] in Chart? - - - - - No - copy requested -  Would patient like information on creating a medical advance directive? Yes (MAU/Ambulatory/Procedural Areas - Information given) Yes (MAU/Ambulatory/Procedural Areas - Information given) No - Patient declined Yes (MAU/Ambulatory/Procedural Areas - Information given) No - Patient declined - -    Tobacco Social History   Tobacco Use  Smoking Status Current Every Day Smoker  . Packs/day: 1.50  . Years: 50.00  . Pack years: 75.00   Smokeless Tobacco Never Used  Tobacco Comment   pt wants to try patches again but states they are too costly     Ready to quit: Not Answered Counseling given: Not Answered Comment: pt wants to try patches again but states they are too costly   Clinical Intake:  Pre-visit preparation completed: Yes  Pain : No/denies pain     BMI - recorded: 26.15 Nutritional Status: BMI 25 -29 Overweight Nutritional Risks: None Diabetes: No  How often do you need to have someone help you when you read instructions, pamphlets, or other written materials from your doctor or pharmacy?: 1 - Never  Interpreter Needed?: No  Information entered by :: Clemetine Marker LPN  Past Medical History:  Diagnosis Date  . Anxiety    PANIC ATTACKS  . Arthritis    right shoulder  . Atherosclerosis of aorta ([place]) 02/12/2015  . COPD (chronic obstructive pulmonary disease) ([place])   . Cough   . Depression   . Environmental and seasonal allergies   . Essential hypertension 02/12/2015  . Family history of adverse reaction to anesthesia    Father - 25 - MI, then stroke after Prostate surgery  . GERD (gastroesophageal reflux disease)   . Hyperlipidemia   . Hypertension   . Hypothyroidism   . Incisional hernia, without obstruction or gangrene   . PVD (peripheral vascular disease) ([place]) 11/17/2016  . Thyroid disease   . Venous insufficiency   . Vertigo    can happen daily  . Wears hearing aid in both ears  has, does not wear   Past Surgical History:  Procedure Laterality Date  . CATARACT EXTRACTION W/PHACO Right 03/16/2018   Procedure: CATARACT EXTRACTION PHACO AND INTRAOCULAR LENS PLACEMENT (IOC);  Surgeon: Marchia Meiers, MD;  Location: [facility];  Service: Ophthalmology;  Laterality: Right;  [place]  01:05 CDE 11.85 Fluid pack lot # [id number]  . CHOLECYSTECTOMY    . COLONOSCOPY  2015   Dr Allen Norris- cleared for 5 years   . COLONOSCOPY WITH PROPOFOL N/A 01/17/2018   Procedure: COLONOSCOPY WITH PROPOFOL;   Surgeon: Lucilla Lame, MD;  Location: [place];  Service: Endoscopy;  Laterality: N/A;  requests [place]  . INSERTION OF MESH N/A 08/31/2017   Procedure: INSERTION OF MESH;  Surgeon: Vickie Epley, MD;  Location: [facility];  Service: General;  Laterality: N/A;  . POLYPECTOMY  01/17/2018   Procedure: POLYPECTOMY;  Surgeon: Lucilla Lame, MD;  Location: [place];  Service: Endoscopy;;  . UMBILICAL HERNIA REPAIR N/A 08/31/2017   Procedure: HERNIA REPAIR UMBILICAL ADULT;  Surgeon: Vickie Epley, MD;  Location: [facility];  Service: General;  Laterality: N/A;  . VEIN SURGERY     2 stents in R) leg   Family History  Problem Relation Age of Onset  . Diabetes Mother   . Cancer Father   . Heart disease Father   . Stroke Father    Social History   Socioeconomic History  . Marital status: Married    Spouse name: Not on file  . Number of children: 3  . Years of education: Not on file  . Highest education level: 12th grade  Occupational History  . Occupation: retired  Tobacco Use  . Smoking status: Current Every Day Smoker    Packs/day: 1.50    Years: 50.00    Pack years: 75.00  . Smokeless tobacco: Never Used  . Tobacco comment: pt wants to try patches again but states they are too costly  Substance and Sexual Activity  . Alcohol use: Yes    Alcohol/week: 1.0 standard drinks    Types: 1 Cans of beer per week  . Drug use: No  . Sexual activity: Not Currently  Other Topics Concern  . Not on file  Social History Narrative  . Not on file   Social Determinants of Health   Financial Resource Strain: Low Risk   . Difficulty of Paying Living Expenses: Not hard at all  Food Insecurity: No Food Insecurity  . Worried About Charity fundraiser in the Last Year: Never true  . Ran Out of Food in the Last Year: Never true  Transportation Needs: No Transportation Needs  . Lack of Transportation (Medical): No  . Lack of Transportation (Non-Medical): No  Physical Activity:  Inactive  . Days of Exercise per Week: 0 days  . Minutes of Exercise per Session: 0 min  Stress: Stress Concern Present  . Feeling of Stress : To some extent  Social Connections: Unknown  . Frequency of Communication with Friends and Family: Three times a week  . Frequency of Social Gatherings with Friends and Family: Once a week  . Attends Religious Services: Patient refused  . Active Member of Clubs or Organizations: Patient refused  . Attends Archivist Meetings: Patient refused  . Marital Status: Married    Outpatient Encounter Medications as of 07/24/2019  Medication Sig  . acetaminophen (TYLENOL) 325 MG tablet Take 325 mg by mouth daily as needed for moderate pain or headache.  . albuterol (VENTOLIN HFA)  108 (90 Base) MCG/ACT inhaler Inhale 2 puffs into the lungs every 6 (six) hours as needed for wheezing or shortness of breath.  . cetirizine (ZYRTEC) 10 MG tablet Take 1 tablet (10 mg total) by mouth daily.  . clonazePAM (KLONOPIN) 0.5 MG tablet Take 0.5 tablets (0.25 mg total) by mouth daily. Take one half daily  . clopidogrel (PLAVIX) 75 MG tablet Take 1 tablet (75 mg total) by mouth daily.  Marland Kitchen dexlansoprazole (DEXILANT) 60 MG capsule Take 1 capsule (60 mg total) by mouth daily.  . fluticasone (FLONASE) 50 MCG/ACT nasal spray SPRAY 2 SPRAYS INTO EACH NOSTRIL EVERY DAY  . ibuprofen (ADVIL,MOTRIN) 200 MG tablet Take 400 mg by mouth every 8 (eight) hours as needed (for pain).  Marland Kitchen levothyroxine (SYNTHROID) 112 MCG tablet Take 1 tablet (112 mcg total) by mouth daily.  Marland Kitchen losartan (COZAAR) 25 MG tablet Take 2 tablets (50 mg total) by mouth daily.  . rosuvastatin (CRESTOR) 20 MG tablet Take 1 tablet (20 mg total) by mouth daily.  . sertraline (ZOLOFT) 100 MG tablet Take 1 tablet (100 mg total) by mouth daily.   No facility-administered encounter medications on file as of 07/24/2019.    Activities of Daily Living In your present state of health, do you have any difficulty  performing the following activities: 07/24/2019  Hearing? Y  Comment pt wears hearing aids  Vision? N  Difficulty concentrating or making decisions? N  Walking or climbing stairs? N  Dressing or bathing? N  Doing errands, shopping? N  Preparing Food and eating ? N  Using the Toilet? N  In the past six months, have you accidently leaked urine? N  Do you have problems with loss of bowel control? N  Managing your Medications? N  Managing your Finances? N  Housekeeping or managing your Housekeeping? N  Some recent data might be hidden    Patient Care Team: Juline Patch, MD as PCP - General (Family Medicine)   Assessment:   This is a routine wellness examination for [employer].  Exercise Activities and Dietary recommendations Current Exercise Habits: The patient does not participate in regular exercise at present, Exercise limited by: orthopedic condition(s)  Goals    . DIET - REDUCE SODIUM INTAKE     Reduce sodium intake by avoiding adding salt to food.     . Quit Smoking     Pt would like quit smoking over the next year. Plans to start by using the patch to quit.   If you wish to quit smoking, help is available. For free tobacco cessation program offerings call the [facility] at [phone] or Live Well Line at [phone]. You may also visit [email or web address] or email [email or web address] for more information on other programs.         Fall Risk Fall Risk  07/24/2019 06/26/2019 05/15/2019 06/01/2018 07/21/2017  Falls in the past year? 0 0 0 0 No  Number falls in past yr: 0 - - 0 -  Injury with Fall? 0 - - 0 -  Risk for fall due to : No Fall Risks - - - -  Follow up Falls prevention discussed Falls evaluation completed Falls evaluation completed Falls prevention discussed -   FALL RISK PREVENTION PERTAINING TO THE HOME:  Any stairs in or around the home? Yes  If so, do they handrails? Yes   Home free of loose throw rugs in walkways, pet beds,  electrical cords, etc? Yes  Adequate lighting in your home  to reduce risk of falls? Yes   ASSISTIVE DEVICES UTILIZED TO PREVENT FALLS:  Life alert? No  Use of a cane, walker or w/c? No  Grab bars in the bathroom? No  Shower chair or bench in shower? No  Elevated toilet seat or a handicapped toilet? No   DME ORDERS:  DME order needed?  No   TIMED UP AND GO:  Was the test performed? No . Telephonic visit.   Education: Fall risk prevention has been discussed.  Intervention(s) required? No    Depression Screen PHQ 2/9 Scores 07/24/2019 06/26/2019 05/15/2019 11/11/2018  PHQ - 2 Score 0 0 0 0  PHQ- 9 Score - 0 4 0    Cognitive Function     6CIT Screen 07/24/2019 06/01/2018  What Year? 0 points 0 points  What month? 0 points 0 points  What time? 0 points 0 points  Count back from 20 0 points 0 points  Months in reverse 2 points 2 points  Repeat phrase 0 points 4 points  Total Score 2 6    Immunization History  Administered Date(s) Administered  . Fluad Quad(high Dose 65+) 01/18/2019  . Influenza, High Dose Seasonal PF 01/29/2017, 02/09/2018  . PFIZER SARS-COV-2 Vaccination 05/26/2019, 06/20/2019  . Pneumococcal Conjugate-13 11/02/2016  . Pneumococcal Polysaccharide-23 12/20/2017  . Tdap 11/02/2016     Qualifies for Shingles Vaccine?  Yes  . Due for Shingrix. Education has been provided regarding the importance of this vaccine. Pt has been advised to call insurance company to determine out of pocket expense. Advised may also receive vaccine at local pharmacy or Health Dept. Verbalized acceptance and understanding.  Tdap: Up to date  Flu Vaccine: Up to date  Pneumococcal Vaccine: Up to date   Screening Tests Health Maintenance  Topic Date Due  . COLONOSCOPY  01/18/2023  . TETANUS/TDAP  11/03/2026  . INFLUENZA VACCINE  Completed  . Hepatitis C Screening  Completed  . PNA vac Low Risk Adult  Completed   Cancer Screenings:  Colorectal Screening: Completed 01/17/18.  Repeat every 5 years;   Lung Cancer Screening: (Low Dose CT Chest recommended if Age 62-80 years, 30 pack-year currently smoking OR have quit w/in 15years.) does qualify. Completed 06/22/19.    Additional Screening:  Hepatitis C Screening: does not qualify; Completed 07/27/17  Vision Screening: Recommended annual ophthalmology exams for early detection of glaucoma and other disorders of the eye. Is the patient up to date with their annual eye exam?  No  Who is the provider or what is the name of the office in which the pt attends annual eye exams? [place] Screening: Recommended annual dental exams for proper oral hygiene  Community Resource Referral:  CRR required this visit?  No        Plan:    I have personally reviewed and addressed the Medicare Annual Wellness questionnaire and have noted the following in the patient's chart:  A. Medical and social history B. Use of alcohol, tobacco or illicit drugs  C. Current medications and supplements D. Functional ability and status E.  Nutritional status F.  Physical activity G. Advance directives H. List of other physicians I.  Hospitalizations, surgeries, and ER visits in previous 12 months J.  [place] such as hearing and vision if needed, cognitive and depression L. Referrals and appointments   In addition, I have reviewed and discussed with patient certain preventive protocols, quality metrics, and best practice recommendations. A written personalized care plan for  preventive services as well as general preventive health recommendations were provided to patient.   Signed,  Clemetine Marker, LPN Nurse Health Advisor   Nurse Notes: none

## 2019-08-07 DIAGNOSIS — E782 Mixed hyperlipidemia: Secondary | ICD-10-CM | POA: Diagnosis not present

## 2019-08-07 DIAGNOSIS — I25118 Atherosclerotic heart disease of native coronary artery with other forms of angina pectoris: Secondary | ICD-10-CM | POA: Diagnosis not present

## 2019-08-07 DIAGNOSIS — R0602 Shortness of breath: Secondary | ICD-10-CM | POA: Diagnosis not present

## 2019-08-07 DIAGNOSIS — I10 Essential (primary) hypertension: Secondary | ICD-10-CM | POA: Diagnosis not present

## 2019-08-07 DIAGNOSIS — I70219 Atherosclerosis of native arteries of extremities with intermittent claudication, unspecified extremity: Secondary | ICD-10-CM | POA: Diagnosis not present

## 2019-09-12 DIAGNOSIS — I6523 Occlusion and stenosis of bilateral carotid arteries: Secondary | ICD-10-CM | POA: Diagnosis not present

## 2019-09-12 DIAGNOSIS — I25118 Atherosclerotic heart disease of native coronary artery with other forms of angina pectoris: Secondary | ICD-10-CM | POA: Diagnosis not present

## 2019-09-12 DIAGNOSIS — I70219 Atherosclerosis of native arteries of extremities with intermittent claudication, unspecified extremity: Secondary | ICD-10-CM | POA: Diagnosis not present

## 2019-09-20 DIAGNOSIS — I25118 Atherosclerotic heart disease of native coronary artery with other forms of angina pectoris: Secondary | ICD-10-CM | POA: Diagnosis not present

## 2019-09-20 DIAGNOSIS — I10 Essential (primary) hypertension: Secondary | ICD-10-CM | POA: Diagnosis not present

## 2019-09-20 DIAGNOSIS — I6523 Occlusion and stenosis of bilateral carotid arteries: Secondary | ICD-10-CM | POA: Insufficient documentation

## 2019-09-20 DIAGNOSIS — E782 Mixed hyperlipidemia: Secondary | ICD-10-CM | POA: Diagnosis not present

## 2019-09-20 DIAGNOSIS — I70219 Atherosclerosis of native arteries of extremities with intermittent claudication, unspecified extremity: Secondary | ICD-10-CM | POA: Diagnosis not present

## 2019-11-11 ENCOUNTER — Other Ambulatory Visit: Payer: Self-pay | Admitting: Family Medicine

## 2019-11-11 DIAGNOSIS — F3342 Major depressive disorder, recurrent, in full remission: Secondary | ICD-10-CM

## 2019-11-11 NOTE — Telephone Encounter (Signed)
Requested Prescriptions  Pending Prescriptions Disp Refills   sertraline (ZOLOFT) 100 MG tablet [Pharmacy Med Name: SERTRALINE HCL 100 MG TABLET] 90 tablet 0    Sig: TAKE 1 TABLET BY MOUTH EVERY DAY     Psychiatry:  Antidepressants - SSRI Passed - 11/11/2019 10:00 AM      Passed - Completed PHQ-2 or PHQ-9 in the last 360 days.      Passed - Valid encounter within last 6 months    Recent Outpatient Visits          4 months ago Memory changes   [facility] Juline Patch, MD   6 months ago Essential hypertension   [facility] Juline Patch, MD   11 months ago COPD with acute exacerbation [facility])   [facility] Juline Patch, MD   1 year ago Essential hypertension   [facility] Juline Patch, MD   1 year ago Acute bronchitis with chronic obstructive pulmonary disease (COPD) [facility])   [facility] Juline Patch, MD

## 2019-11-16 ENCOUNTER — Other Ambulatory Visit: Payer: Self-pay | Admitting: Family Medicine

## 2019-11-16 ENCOUNTER — Ambulatory Visit: Payer: Medicare PPO | Admitting: Family Medicine

## 2019-11-16 ENCOUNTER — Encounter: Payer: Self-pay | Admitting: Family Medicine

## 2019-11-16 ENCOUNTER — Other Ambulatory Visit: Payer: Self-pay

## 2019-11-16 DIAGNOSIS — F419 Anxiety disorder, unspecified: Secondary | ICD-10-CM | POA: Diagnosis not present

## 2019-11-16 DIAGNOSIS — I10 Essential (primary) hypertension: Secondary | ICD-10-CM | POA: Diagnosis not present

## 2019-11-16 DIAGNOSIS — I700 Atherosclerosis of aorta: Secondary | ICD-10-CM

## 2019-11-16 DIAGNOSIS — J440 Chronic obstructive pulmonary disease with acute lower respiratory infection: Secondary | ICD-10-CM | POA: Diagnosis not present

## 2019-11-16 DIAGNOSIS — E039 Hypothyroidism, unspecified: Secondary | ICD-10-CM

## 2019-11-16 DIAGNOSIS — K219 Gastro-esophageal reflux disease without esophagitis: Secondary | ICD-10-CM

## 2019-11-16 DIAGNOSIS — F3342 Major depressive disorder, recurrent, in full remission: Secondary | ICD-10-CM | POA: Diagnosis not present

## 2019-11-16 DIAGNOSIS — J209 Acute bronchitis, unspecified: Secondary | ICD-10-CM

## 2019-11-16 MED ORDER — FLUTICASONE PROPIONATE 50 MCG/ACT NA SUSP: NASAL | 1 refills | Status: DC

## 2019-11-16 MED ORDER — CETIRIZINE HCL 10 MG PO TABS: 10.0000 mg | ORAL_TABLET | Freq: Every day | ORAL | 1 refills | Status: DC

## 2019-11-16 MED ORDER — CLONAZEPAM 0.5 MG PO TABS: 0.2500 mg | ORAL_TABLET | Freq: Every day | ORAL | 5 refills | Status: DC

## 2019-11-16 MED ORDER — CLOPIDOGREL BISULFATE 75 MG PO TABS: 75.0000 mg | ORAL_TABLET | Freq: Every day | ORAL | 1 refills | Status: DC

## 2019-11-16 MED ORDER — DEXILANT 60 MG PO CPDR: 1.0000 | DELAYED_RELEASE_CAPSULE | Freq: Every day | ORAL | 1 refills | Status: DC

## 2019-11-16 MED ORDER — LOSARTAN POTASSIUM 25 MG PO TABS: 50.0000 mg | ORAL_TABLET | Freq: Every day | ORAL | 1 refills | Status: DC

## 2019-11-16 MED ORDER — ROSUVASTATIN CALCIUM 20 MG PO TABS: 20.0000 mg | ORAL_TABLET | Freq: Every day | ORAL | 1 refills | Status: DC

## 2019-11-16 MED ORDER — SERTRALINE HCL 100 MG PO TABS: 100.0000 mg | ORAL_TABLET | Freq: Every day | ORAL | 1 refills | Status: DC

## 2019-11-16 MED ORDER — ALBUTEROL SULFATE HFA 108 (90 BASE) MCG/ACT IN AERS: 2.0000 | INHALATION_SPRAY | Freq: Four times a day (QID) | RESPIRATORY_TRACT | 11 refills | Status: DC | PRN

## 2019-11-16 MED ORDER — LEVOTHYROXINE SODIUM 112 MCG PO TABS: 112.0000 ug | ORAL_TABLET | Freq: Every day | ORAL | 1 refills | Status: DC

## 2019-11-16 NOTE — Telephone Encounter (Signed)
Requested medication (s) are due for refill today:   Provider to decide  Requested medication (s) are on the active medication list:   Yes  Future visit scheduled:   No   Last ordered: Non delegated refill   Requested Prescriptions  Pending Prescriptions Disp Refills   clonazePAM (KLONOPIN) 0.5 MG tablet [Pharmacy Med Name: CLONAZEPAM 0.5 MG TABLET] 15 tablet     Sig: TAKE 1/2 TABLET BY MOUTH DAILY      Not Delegated - Psychiatry:  Anxiolytics/Hypnotics Failed - 11/16/2019  9:24 AM      Failed - This refill cannot be delegated      Failed - Urine Drug Screen completed in last 360 days.      Passed - Valid encounter within last 6 months    Recent Outpatient Visits           4 months ago Memory changes   [facility] Juline Patch, MD   6 months ago Essential hypertension   [place], Deanna C, MD   12 months ago COPD with acute exacerbation [facility])   [facility] Juline Patch, MD   1 year ago Essential hypertension   [facility] Juline Patch, MD   1 year ago Acute bronchitis with chronic obstructive pulmonary disease (COPD) [facility])   [facility] Juline Patch, MD

## 2019-11-16 NOTE — Progress Notes (Signed)
Date:  11/16/2019   Name:  Joel Flores   DOB:  Sep 19, 1950   MRN:  [id number]   Chief Complaint: COPD, Gastroesophageal Reflux, Anxiety, Depression, Hypertension, Hyperlipidemia, Hypothyroidism, and Coronary Artery Disease  COPD There is no chest tightness, cough, difficulty breathing, frequent throat clearing, hemoptysis, hoarse voice, shortness of breath, sputum production or wheezing. This is a chronic problem. The current episode started more than 1 year ago. The problem occurs daily. The problem has been waxing and waning. Pertinent negatives include no appetite change, chest pain, dyspnea on exertion, ear congestion, ear pain, fever, headaches, heartburn, malaise/fatigue, myalgias, nasal congestion, orthopnea, PND, postnasal drip, rhinorrhea, sneezing, sore throat, sweats, trouble swallowing or weight loss. His symptoms are alleviated by beta-agonist. He reports moderate improvement on treatment. His past medical history is significant for COPD.  Gastroesophageal Reflux He reports no abdominal pain, no belching, no chest pain, no choking, no coughing, no dysphagia, no globus sensation, no heartburn, no hoarse voice, no nausea, no sore throat or no wheezing. This is a chronic problem. The current episode started more than 1 year ago. The problem occurs occasionally. The problem has been gradually improving. The symptoms are aggravated by certain foods. Pertinent negatives include no anemia, fatigue, melena, muscle weakness, orthopnea or weight loss. He has tried a PPI for the symptoms. The treatment provided moderate relief. Past procedures do not include an abdominal ultrasound, an EGD, esophageal manometry, esophageal pH monitoring, H. pylori antibody titer or a UGI.  Anxiety Presents for follow-up visit. Symptoms include excessive worry, irritability, nervous/anxious behavior and restlessness. Patient reports no chest pain, compulsions, confusion, decreased concentration, depressed mood,  dizziness, dry mouth, feeling of choking, hyperventilation, impotence, insomnia, malaise, muscle tension, nausea, obsessions, palpitations, panic, shortness of breath or suicidal ideas. Symptoms occur occasionally.   His past medical history is significant for CAD.  Depression        This is a chronic problem.  The current episode started more than 1 year ago.   The problem occurs intermittently.  The problem has been gradually improving since onset.  Associated symptoms include restlessness.  Associated symptoms include no decreased concentration, no fatigue, no helplessness, no hopelessness, does not have insomnia, not irritable, no decreased interest, no appetite change, no body aches, no myalgias, no headaches, no indigestion, not sad and no suicidal ideas.  Past medical history includes anxiety.     Pertinent negatives include no hypothyroidism. Hypertension This is a chronic problem. The current episode started more than 1 year ago. The problem has been waxing and waning since onset. Associated symptoms include anxiety. Pertinent negatives include no blurred vision, chest pain, headaches, malaise/fatigue, neck pain, orthopnea, palpitations, peripheral edema, PND, shortness of breath or sweats. Risk factors for coronary artery disease include dyslipidemia. The current treatment provides moderate improvement. There is no history of chronic renal disease.  Hyperlipidemia This is a chronic problem. The problem is controlled. Recent lipid tests were reviewed and are variable. He has no history of chronic renal disease, diabetes, hypothyroidism, liver disease, obesity or nephrotic syndrome. Pertinent negatives include no chest pain, focal sensory loss, focal weakness, leg pain, myalgias or shortness of breath. The current treatment provides moderate improvement of lipids. There are no compliance problems.   Coronary Artery Disease Presents for follow-up visit. Pertinent negatives include no chest pain,  chest pressure, chest tightness, dizziness, leg swelling, muscle weakness, palpitations, shortness of breath or weight gain. Risk factors include hyperlipidemia and hypertension. Risk factors do not include obesity. The  symptoms have been stable.    Lab Results  Component Value Date   CREATININE 0.75 (L) 05/15/2019   BUN 18 05/15/2019   NA 139 05/15/2019   K 4.8 05/15/2019   CL 103 05/15/2019   CO2 22 05/15/2019   Lab Results  Component Value Date   CHOL 141 05/15/2019   HDL 48 05/15/2019   LDLCALC 72 05/15/2019   TRIG 118 05/15/2019   CHOLHDL 3.7 06/17/2018   Lab Results  Component Value Date   TSH 4.310 06/26/2019   No results found for: HGBA1C Lab Results  Component Value Date   WBC 8.5 11/11/2018   HGB 15.1 11/11/2018   HCT 44.1 11/11/2018   MCV 86 11/11/2018   PLT 322 11/11/2018   Lab Results  Component Value Date   ALT 21 05/15/2019   AST 22 05/15/2019   ALKPHOS 94 05/15/2019   BILITOT 0.3 05/15/2019     Review of Systems  Constitutional: Positive for irritability. Negative for appetite change, chills, fatigue, fever, malaise/fatigue, weight gain and weight loss.  HENT: Negative for drooling, ear discharge, ear pain, hoarse voice, postnasal drip, rhinorrhea, sneezing, sore throat and trouble swallowing.   Eyes: Negative for blurred vision.  Respiratory: Negative for cough, hemoptysis, sputum production, choking, chest tightness, shortness of breath and wheezing.   Cardiovascular: Negative for chest pain, dyspnea on exertion, palpitations, orthopnea, leg swelling and PND.  Gastrointestinal: Negative for abdominal pain, blood in stool, constipation, diarrhea, dysphagia, heartburn, melena and nausea.  Endocrine: Negative for polydipsia.  Genitourinary: Negative for dysuria, frequency, hematuria, impotence and urgency.  Musculoskeletal: Negative for back pain, myalgias, muscle weakness and neck pain.  Skin: Negative for rash.  Allergic/Immunologic: Negative for  environmental allergies.  Neurological: Negative for dizziness, focal weakness and headaches.  Hematological: Does not bruise/bleed easily.  Psychiatric/Behavioral: Positive for depression. Negative for confusion, decreased concentration and suicidal ideas. The patient is nervous/anxious. The patient does not have insomnia.     Patient Active Problem List   Diagnosis Date Noted  . Colon cancer screening   . Polyp of sigmoid colon   . SOBOE (shortness of breath on exertion) 07/30/2017  . PVD (peripheral vascular disease) ([place]) 11/17/2016  . Tobacco use disorder 11/17/2016  . Reactive airway disease, mild intermittent, uncomplicated 38/75/6433  . Allergic rhinitis due to pollen 08/13/2015  . Nocturia 08/13/2015  . Atherosclerosis of aorta ([place]) 02/12/2015  . Essential hypertension 02/12/2015  . Esophageal reflux 02/12/2015  . Depression 02/12/2015  . Adult hypothyroidism 02/12/2015  . Chronic anxiety 02/12/2015  . Hyperlipemia 02/12/2015    Allergies  Allergen Reactions  . Sulfa Antibiotics Rash    Past Surgical History:  Procedure Laterality Date  . CATARACT EXTRACTION W/PHACO Right 03/16/2018   Procedure: CATARACT EXTRACTION PHACO AND INTRAOCULAR LENS PLACEMENT (IOC);  Surgeon: Marchia Meiers, MD;  Location: [facility];  Service: Ophthalmology;  Laterality: Right;  [place]  01:05 CDE 11.85 Fluid pack lot # [id number]  . CHOLECYSTECTOMY    . COLONOSCOPY  2015   Dr Allen Norris- cleared for 5 years   . COLONOSCOPY WITH PROPOFOL N/A 01/17/2018   Procedure: COLONOSCOPY WITH PROPOFOL;  Surgeon: Lucilla Lame, MD;  Location: [facility];  Service: Endoscopy;  Laterality: N/A;  requests early  . INSERTION OF MESH N/A 08/31/2017   Procedure: INSERTION OF MESH;  Surgeon: Vickie Epley, MD;  Location: [facility];  Service: General;  Laterality: N/A;  . POLYPECTOMY  01/17/2018   Procedure: POLYPECTOMY;  Surgeon: Lucilla Lame, MD;  Location:  [place];  Service: Endoscopy;;  . UMBILICAL  HERNIA REPAIR N/A 08/31/2017   Procedure: HERNIA REPAIR UMBILICAL ADULT;  Surgeon: Vickie Epley, MD;  Location: [facility];  Service: General;  Laterality: N/A;  . VEIN SURGERY     2 stents in R) leg    Social History   Tobacco Use  . Smoking status: Current Every Day Smoker    Packs/day: 1.50    Years: 50.00    Pack years: 75.00  . Smokeless tobacco: Never Used  . Tobacco comment: pt wants to try patches again but states they are too costly  Vaping Use  . Vaping Use: Never used  Substance Use Topics  . Alcohol use: Yes    Alcohol/week: 1.0 standard drink    Types: 1 Cans of beer per week  . Drug use: No     Medication list has been reviewed and updated.  Current Meds  Medication Sig  . acetaminophen (TYLENOL) 325 MG tablet Take 325 mg by mouth daily as needed for moderate pain or headache.  . albuterol (VENTOLIN HFA) 108 (90 Base) MCG/ACT inhaler Inhale 2 puffs into the lungs every 6 (six) hours as needed for wheezing or shortness of breath.  . cetirizine (ZYRTEC) 10 MG tablet Take 1 tablet (10 mg total) by mouth daily.  . clonazePAM (KLONOPIN) 0.5 MG tablet Take 0.5 tablets (0.25 mg total) by mouth daily. Take one half daily  . clopidogrel (PLAVIX) 75 MG tablet Take 1 tablet (75 mg total) by mouth daily.  Marland Kitchen dexlansoprazole (DEXILANT) 60 MG capsule Take 1 capsule (60 mg total) by mouth daily.  . fluticasone (FLONASE) 50 MCG/ACT nasal spray SPRAY 2 SPRAYS INTO EACH NOSTRIL EVERY DAY  . ibuprofen (ADVIL,MOTRIN) 200 MG tablet Take 400 mg by mouth every 8 (eight) hours as needed (for pain).  Marland Kitchen levothyroxine (SYNTHROID) 112 MCG tablet Take 1 tablet (112 mcg total) by mouth daily.  Marland Kitchen losartan (COZAAR) 25 MG tablet Take 2 tablets (50 mg total) by mouth daily.  . rosuvastatin (CRESTOR) 20 MG tablet Take 1 tablet (20 mg total) by mouth daily.  . sertraline (ZOLOFT) 100 MG tablet TAKE 1 TABLET BY MOUTH EVERY DAY    PHQ 2/9 Scores 11/16/2019 07/24/2019 06/26/2019 05/15/2019  PHQ - 2  Score 0 0 0 0  PHQ- 9 Score 0 - 0 4    GAD 7 : Generalized Anxiety Score 11/16/2019 06/26/2019 05/15/2019 11/11/2018  Nervous, Anxious, on Edge 3 0 0 0  Control/stop worrying 0 0 0 0  Worry too much - different things 0 0 0 0  Trouble relaxing 0 0 0 0  Restless 0 0 0 0  Easily annoyed or irritable 0 0 2 0  Afraid - awful might happen 1 0 1 0  Total GAD 7 Score 4 0 3 0  Anxiety Difficulty Not difficult at all - Not difficult at all -    BP Readings from Last 3 Encounters:  11/16/19 126/84  07/24/19 130/90  06/26/19 128/80    Physical Exam Vitals and nursing note reviewed. Exam conducted with a chaperone present.  Constitutional:      General: He is not irritable. HENT:     Head: Normocephalic.     Right Ear: Tympanic membrane, ear canal and external ear normal.     Left Ear: Tympanic membrane, ear canal and external ear normal.     Nose: Nose normal. No congestion or rhinorrhea.  Eyes:     General: No scleral icterus.  Right eye: No discharge.        Left eye: No discharge.     Conjunctiva/sclera: Conjunctivae normal.     Pupils: Pupils are equal, round, and reactive to light.  Neck:     Thyroid: No thyromegaly.     Vascular: No JVD.     Trachea: No tracheal deviation.  Cardiovascular:     Rate and Rhythm: Normal rate and regular rhythm.     Heart sounds: Normal heart sounds. No murmur heard.  No friction rub. No gallop.   Pulmonary:     Effort: No respiratory distress.     Breath sounds: Normal breath sounds. No wheezing, rhonchi or rales.  Abdominal:     General: Bowel sounds are normal.     Palpations: Abdomen is soft. There is no mass.     Tenderness: There is no abdominal tenderness. There is no guarding or rebound.  Musculoskeletal:        General: No tenderness. Normal range of motion.     Cervical back: Normal range of motion and neck supple.  Lymphadenopathy:     Cervical: No cervical adenopathy.  Skin:    General: Skin is warm.     Findings: No  bruising, erythema or rash.  Neurological:     Mental Status: He is alert and oriented to person, place, and time.     Cranial Nerves: No cranial nerve deficit.     Deep Tendon Reflexes: Reflexes are normal and symmetric.     Wt Readings from Last 3 Encounters:  11/16/19 171 lb (77.6 kg)  07/24/19 172 lb (78 kg)  06/26/19 172 lb (78 kg)    BP 126/84   Pulse 88   Ht 5' 7.5" (1.715 m)   Wt 171 lb (77.6 kg)   BMI 26.39 kg/m   Assessment and Plan: 1. Acute bronchitis with chronic obstructive pulmonary disease (COPD) (HCC) Chronic.  Controlled.  Stable.  Continue albuterol inhaler 2 puffs every 6 hours. - albuterol (VENTOLIN HFA) 108 (90 Base) MCG/ACT inhaler; Inhale 2 puffs into the lungs every 6 (six) hours as needed for wheezing or shortness of breath.  Dispense: 8 g; Refill: 11  2. Chronic anxiety Chronic.  Controlled.  Stable.  Patient will continue 1/2 tablet 0.25 mg clonazepam twice a day. - clonazePAM (KLONOPIN) 0.5 MG tablet; Take 0.5 tablets (0.25 mg total) by mouth daily. Take one half daily  Dispense: 15 tablet; Refill: 5  3. Atherosclerosis of aorta (HCC) Chronic.  Controlled.  Stable.  Continue Plavix 75 mg once a day and Crestor 20 mg once a day - clopidogrel (PLAVIX) 75 MG tablet; Take 1 tablet (75 mg total) by mouth daily.  Dispense: 90 tablet; Refill: 1 - rosuvastatin (CRESTOR) 20 MG tablet; Take 1 tablet (20 mg total) by mouth daily.  Dispense: 90 tablet; Refill: 1  4. Gastroesophageal reflux disease, unspecified whether esophagitis present Chronic.  Controlled.  Stable.  Continue Dexilant 60 mg once a day. - dexlansoprazole (DEXILANT) 60 MG capsule; Take 1 capsule (60 mg total) by mouth daily.  Dispense: 90 capsule; Refill: 1  5. Adult hypothyroidism Chronic.  Controlled.  Stable.  Continue Synthroid 112 mcg daily. - levothyroxine (SYNTHROID) 112 MCG tablet; Take 1 tablet (112 mcg total) by mouth daily.  Dispense: 90 tablet; Refill: 1  6. Essential  hypertension Chronic.  Controlled.  Stable.  Continue losartan 25 mg 2 tablets daily. - losartan (COZAAR) 25 MG tablet; Take 2 tablets (50 mg total) by mouth daily.  Dispense:  180 tablet; Refill: 1  7. Recurrent major depressive disorder, in full remission ([place]) Chronic.  Controlled.  Stable.  We will continue Zoloft 100 mg once a day. - sertraline (ZOLOFT) 100 MG tablet; Take 1 tablet (100 mg total) by mouth daily.  Dispense: 90 tablet; Refill: 1

## 2019-11-22 DIAGNOSIS — L570 Actinic keratosis: Secondary | ICD-10-CM | POA: Diagnosis not present

## 2019-11-22 DIAGNOSIS — L578 Other skin changes due to chronic exposure to nonionizing radiation: Secondary | ICD-10-CM | POA: Diagnosis not present

## 2019-11-22 DIAGNOSIS — B078 Other viral warts: Secondary | ICD-10-CM | POA: Diagnosis not present

## 2019-11-22 DIAGNOSIS — L738 Other specified follicular disorders: Secondary | ICD-10-CM | POA: Diagnosis not present

## 2019-12-25 ENCOUNTER — Telehealth: Payer: Self-pay | Admitting: Family Medicine

## 2019-12-25 NOTE — Telephone Encounter (Unsigned)
Copied from [place] [id number]. Topic: General - Other >> Dec 25, 2019  8:48 AM Rainey Pines A wrote: Patient would like a callback from [place] and did not wish to go into detail about what his call is in regards to

## 2019-12-25 NOTE — Telephone Encounter (Signed)
Spoke to pt concerning thyroid dosing

## 2020-01-17 ENCOUNTER — Other Ambulatory Visit: Payer: Self-pay | Admitting: Family Medicine

## 2020-01-17 DIAGNOSIS — E039 Hypothyroidism, unspecified: Secondary | ICD-10-CM

## 2020-02-19 ENCOUNTER — Ambulatory Visit: Payer: Medicare PPO | Attending: Internal Medicine

## 2020-02-19 DIAGNOSIS — Z23 Encounter for immunization: Secondary | ICD-10-CM

## 2020-02-19 NOTE — Progress Notes (Signed)
   Covid-19 Vaccination Clinic  Name:  Joel Flores    MRN: [id number] DOB: 03/18/1951  02/19/2020  [name] was observed post Covid-19 immunization for 15 minutes without incident. He was provided with Vaccine Information Sheet and instruction to access the V-Safe system.   [name] was instructed to call 911 with any severe reactions post vaccine: Marland Kitchen Difficulty breathing  . Swelling of face and throat  . A fast heartbeat  . A bad rash all over body  . Dizziness and weakness

## 2020-02-20 ENCOUNTER — Other Ambulatory Visit: Payer: Self-pay | Admitting: Family Medicine

## 2020-02-20 DIAGNOSIS — E039 Hypothyroidism, unspecified: Secondary | ICD-10-CM

## 2020-03-12 DIAGNOSIS — H2512 Age-related nuclear cataract, left eye: Secondary | ICD-10-CM | POA: Diagnosis not present

## 2020-03-26 DIAGNOSIS — E782 Mixed hyperlipidemia: Secondary | ICD-10-CM | POA: Diagnosis not present

## 2020-03-26 DIAGNOSIS — I10 Essential (primary) hypertension: Secondary | ICD-10-CM | POA: Diagnosis not present

## 2020-03-26 DIAGNOSIS — I2510 Atherosclerotic heart disease of native coronary artery without angina pectoris: Secondary | ICD-10-CM | POA: Diagnosis not present

## 2020-03-26 DIAGNOSIS — I70219 Atherosclerosis of native arteries of extremities with intermittent claudication, unspecified extremity: Secondary | ICD-10-CM | POA: Diagnosis not present

## 2020-03-26 DIAGNOSIS — I6523 Occlusion and stenosis of bilateral carotid arteries: Secondary | ICD-10-CM | POA: Diagnosis not present

## 2020-04-22 ENCOUNTER — Other Ambulatory Visit: Payer: Medicare PPO

## 2020-04-22 DIAGNOSIS — Z20822 Contact with and (suspected) exposure to covid-19: Secondary | ICD-10-CM

## 2020-04-23 LAB — SARS-COV-2, NAA 2 DAY TAT

## 2020-04-23 LAB — NOVEL CORONAVIRUS, NAA: SARS-CoV-2, NAA: NOT DETECTED

## 2020-04-24 DIAGNOSIS — I10 Essential (primary) hypertension: Secondary | ICD-10-CM | POA: Diagnosis not present

## 2020-04-24 DIAGNOSIS — H2512 Age-related nuclear cataract, left eye: Secondary | ICD-10-CM | POA: Diagnosis not present

## 2020-04-29 ENCOUNTER — Encounter: Payer: Self-pay | Admitting: Ophthalmology

## 2020-04-29 ENCOUNTER — Other Ambulatory Visit: Payer: Self-pay

## 2020-05-06 ENCOUNTER — Other Ambulatory Visit: Payer: Self-pay

## 2020-05-06 ENCOUNTER — Other Ambulatory Visit
Admission: RE | Admit: 2020-05-06 | Discharge: 2020-05-06 | Disposition: A | Discharge status: Home / Self Care | Payer: Medicare PPO | Source: Ambulatory Visit | Attending: Ophthalmology | Admitting: Ophthalmology

## 2020-05-06 DIAGNOSIS — Z01812 Encounter for preprocedural laboratory examination: Secondary | ICD-10-CM | POA: Insufficient documentation

## 2020-05-06 DIAGNOSIS — Z20822 Contact with and (suspected) exposure to covid-19: Secondary | ICD-10-CM | POA: Diagnosis not present

## 2020-05-06 LAB — SARS CORONAVIRUS 2 (TAT 6-24 HRS): SARS Coronavirus 2: NEGATIVE

## 2020-05-06 MED ORDER — PROPOFOL 500 MG/50ML IV EMUL
INTRAVENOUS | Status: AC
  Filled 2020-05-06: qty 50

## 2020-05-06 MED ORDER — MIDAZOLAM HCL 2 MG/2ML IJ SOLN
INTRAMUSCULAR | Status: AC
  Filled 2020-05-06: qty 2

## 2020-05-06 MED ORDER — FENTANYL CITRATE (PF) 100 MCG/2ML IJ SOLN
INTRAMUSCULAR | Status: AC
  Filled 2020-05-06: qty 2

## 2020-05-06 MED ORDER — LIDOCAINE HCL (PF) 2 % IJ SOLN
INTRAMUSCULAR | Status: AC
  Filled 2020-05-06: qty 5

## 2020-05-06 NOTE — Discharge Instructions (Signed)

## 2020-05-08 ENCOUNTER — Encounter: Payer: Self-pay | Admitting: Ophthalmology

## 2020-05-08 ENCOUNTER — Ambulatory Visit: Payer: Medicare PPO | Admitting: Anesthesiology

## 2020-05-08 ENCOUNTER — Encounter
Admission: RE | Disposition: A | Discharge status: Home / Self Care | Payer: Self-pay | Source: Home / Self Care | Attending: Ophthalmology

## 2020-05-08 ENCOUNTER — Other Ambulatory Visit: Payer: Self-pay

## 2020-05-08 ENCOUNTER — Ambulatory Visit
Admission: RE | Admit: 2020-05-08 | Discharge: 2020-05-08 | Disposition: A | Discharge status: Home / Self Care | Payer: Medicare PPO | Attending: Ophthalmology | Admitting: Ophthalmology

## 2020-05-08 DIAGNOSIS — H25812 Combined forms of age-related cataract, left eye: Secondary | ICD-10-CM | POA: Diagnosis not present

## 2020-05-08 DIAGNOSIS — Z9841 Cataract extraction status, right eye: Secondary | ICD-10-CM | POA: Diagnosis not present

## 2020-05-08 DIAGNOSIS — H2512 Age-related nuclear cataract, left eye: Secondary | ICD-10-CM | POA: Diagnosis not present

## 2020-05-08 DIAGNOSIS — Z882 Allergy status to sulfonamides status: Secondary | ICD-10-CM | POA: Diagnosis not present

## 2020-05-08 HISTORY — DX: Personal history of other diseases of the digestive system: Z87.19

## 2020-05-08 HISTORY — PX: CATARACT EXTRACTION W/PHACO: SHX586

## 2020-05-08 SURGERY — PHACOEMULSIFICATION, CATARACT, WITH IOL INSERTION
Anesthesia: Monitor Anesthesia Care | Site: Eye | Laterality: Left

## 2020-05-08 MED ORDER — LIDOCAINE HCL (PF) 2 % IJ SOLN
INTRAOCULAR | Status: DC | PRN
  Administered 2020-05-08: 1 mL

## 2020-05-08 MED ORDER — ARMC OPHTHALMIC DILATING DROPS
1.0000 "application " | OPHTHALMIC | Status: DC | PRN
  Administered 2020-05-08 (×3): 1 via OPHTHALMIC

## 2020-05-08 MED ORDER — CEFUROXIME OPHTHALMIC INJECTION 1 MG/0.1 ML
INJECTION | OPHTHALMIC | Status: DC | PRN
  Administered 2020-05-08: 0.1 mL via INTRACAMERAL

## 2020-05-08 MED ORDER — LACTATED RINGERS IV SOLN: INTRAVENOUS | Status: DC

## 2020-05-08 MED ORDER — MIDAZOLAM HCL 2 MG/2ML IJ SOLN
INTRAMUSCULAR | Status: DC | PRN
  Administered 2020-05-08: 2 mg via INTRAVENOUS

## 2020-05-08 MED ORDER — BRIMONIDINE TARTRATE-TIMOLOL 0.2-0.5 % OP SOLN
OPHTHALMIC | Status: DC | PRN
  Administered 2020-05-08: 1 [drp] via OPHTHALMIC

## 2020-05-08 MED ORDER — EPINEPHRINE PF 1 MG/ML IJ SOLN
INTRAOCULAR | Status: DC | PRN
  Administered 2020-05-08: 52 mL via OPHTHALMIC

## 2020-05-08 MED ORDER — NA HYALUR & NA CHOND-NA HYALUR 0.4-0.35 ML IO KIT
PACK | INTRAOCULAR | Status: DC | PRN
  Administered 2020-05-08: 1 mL via INTRAOCULAR

## 2020-05-08 MED ORDER — TETRACAINE HCL 0.5 % OP SOLN
1.0000 [drp] | OPHTHALMIC | Status: DC | PRN
  Administered 2020-05-08 (×3): 1 [drp] via OPHTHALMIC

## 2020-05-08 MED ORDER — FENTANYL CITRATE (PF) 100 MCG/2ML IJ SOLN
INTRAMUSCULAR | Status: DC | PRN
  Administered 2020-05-08 (×2): 50 ug via INTRAVENOUS

## 2020-05-08 SURGICAL SUPPLY — 22 items
CANNULA ANT/CHMB 27G (MISCELLANEOUS) ×1 IMPLANT
CANNULA ANT/CHMB 27GA (MISCELLANEOUS) ×2 IMPLANT
GLOVE SURG LX 7.5 STRW (GLOVE) ×1
GLOVE SURG LX STRL 7.5 STRW (GLOVE) ×1 IMPLANT
GLOVE SURG TRIUMPH 8.0 PF LTX (GLOVE) ×2 IMPLANT
GOWN STRL REUS W/ TWL LRG LVL3 (GOWN DISPOSABLE) ×2 IMPLANT
GOWN STRL REUS W/TWL LRG LVL3 (GOWN DISPOSABLE) ×4
LENS IOL ACRYSOF IQ 21.0 (Intraocular Lens) ×1 IMPLANT
MARKER SKIN DUAL TIP RULER LAB (MISCELLANEOUS) ×2 IMPLANT
NDL CAPSULORHEX 25GA (NEEDLE) ×1 IMPLANT
NDL FILTER BLUNT 18X1 1/2 (NEEDLE) ×2 IMPLANT
NEEDLE CAPSULORHEX 25GA (NEEDLE) ×2 IMPLANT
NEEDLE FILTER BLUNT 18X 1/2SAF (NEEDLE) ×2
NEEDLE FILTER BLUNT 18X1 1/2 (NEEDLE) ×2 IMPLANT
PACK CATARACT BRASINGTON (MISCELLANEOUS) ×2 IMPLANT
PACK EYE AFTER SURG (MISCELLANEOUS) ×2 IMPLANT
PACK OPTHALMIC (MISCELLANEOUS) ×2 IMPLANT
SOLUTION OPHTHALMIC SALT (MISCELLANEOUS) ×2 IMPLANT
SYR 3ML LL SCALE MARK (SYRINGE) ×4 IMPLANT
SYR TB 1ML LUER SLIP (SYRINGE) ×2 IMPLANT
WATER STERILE IRR 250ML POUR (IV SOLUTION) ×2 IMPLANT
WIPE NON LINTING 3.25X3.25 (MISCELLANEOUS) ×2 IMPLANT

## 2020-05-08 NOTE — Transfer of Care (Signed)
Immediate Anesthesia Transfer of Care Note  Patient: Joel Flores  Procedure(s) Performed: CATARACT EXTRACTION PHACO AND INTRAOCULAR LENS PLACEMENT (IOC) LEFT 7.32 00:54.7 13.4% (Left Eye)  Patient Location: PACU  Anesthesia Type: MAC  Level of Consciousness: awake, alert  and patient cooperative  Airway and Oxygen Therapy: Patient Spontanous Breathing and Patient connected to supplemental oxygen  Post-op Assessment: Post-op Vital signs reviewed, Patient's Cardiovascular Status Stable, Respiratory Function Stable, Patent Airway and No signs of Nausea or vomiting  Post-op Vital Signs: Reviewed and stable  Complications: No complications documented.

## 2020-05-08 NOTE — H&P (Signed)

## 2020-05-08 NOTE — Anesthesia Procedure Notes (Signed)
Procedure Name: MAC Date/Time: 05/08/2020 12:05 PM Performed by: Jeannene Patella, CRNA Pre-anesthesia Checklist: Patient identified, Emergency Drugs available, Suction available, Timeout performed and Patient being monitored Patient Re-evaluated:Patient Re-evaluated prior to induction Oxygen Delivery Method: Nasal cannula Placement Confirmation: positive ETCO2

## 2020-05-08 NOTE — Op Note (Signed)
OPERATIVE NOTE  Joel Flores [id number] 05/08/2020   PREOPERATIVE DIAGNOSIS:  Nuclear sclerotic cataract left eye. H25.12   POSTOPERATIVE DIAGNOSIS:    Nuclear sclerotic cataract left eye.     PROCEDURE:  Phacoemusification with posterior chamber intraocular lens placement of the left eye  Ultrasound time: Procedure(s): CATARACT EXTRACTION PHACO AND INTRAOCULAR LENS PLACEMENT (IOC) LEFT 7.32 00:54.7 13.4% (Left)  LENS:   Implant Name Type Inv. Item Serial No. Manufacturer Lot No. LRB No. Used Action  LENS IOL ACRYSOF IQ 21.0 - [id number] Intraocular Lens LENS IOL ACRYSOF IQ 21.0 [id number] ALCON  Left 1 Implanted      SURGEON:  Wyonia Hough, MD   ANESTHESIA:  Topical with tetracaine drops and 2% Xylocaine jelly, augmented with 1% preservative-free intracameral lidocaine.    COMPLICATIONS:  None.   DESCRIPTION OF PROCEDURE:  The patient was identified in the holding room and transported to the operating room and placed in the supine position under the operating microscope.  The left eye was identified as the operative eye and it was prepped and draped in the usual sterile ophthalmic fashion.   A 1 millimeter clear-corneal paracentesis was made at the 1:30 position.  0.5 ml of preservative-free 1% lidocaine was injected into the anterior chamber.  The anterior chamber was filled with Viscoat viscoelastic.  A 2.4 millimeter keratome was used to make a near-clear corneal incision at the 10:30 position.  .  A curvilinear capsulorrhexis was made with a cystotome and capsulorrhexis forceps.  Balanced salt solution was used to hydrodissect and hydrodelineate the nucleus.   Phacoemulsification was then used in stop and chop fashion to remove the lens nucleus and epinucleus.  The remaining cortex was then removed using the irrigation and aspiration handpiece. Provisc was then placed into the capsular bag to distend it for lens placement.  A lens was then injected into the capsular  bag.  The remaining viscoelastic was aspirated.   Wounds were hydrated with balanced salt solution.  The anterior chamber was inflated to a physiologic pressure with balanced salt solution.  No wound leaks were noted. Cefuroxime 0.1 ml of a 10mg /ml solution was injected into the anterior chamber for a dose of 1 mg of intracameral antibiotic at the completion of the case.   Timolol and Brimonidine drops were applied to the eye.  The patient was taken to the recovery room in stable condition without complications of anesthesia or surgery.  [name] 05/08/2020, 12:20 PM

## 2020-05-08 NOTE — Anesthesia Postprocedure Evaluation (Signed)
Anesthesia Post Note  Patient: Joel Flores  Procedure(s) Performed: CATARACT EXTRACTION PHACO AND INTRAOCULAR LENS PLACEMENT (IOC) LEFT 7.32 00:54.7 13.4% (Left Eye)     Patient location during evaluation: PACU Anesthesia Type: MAC Level of consciousness: awake and alert Pain management: pain level controlled Vital Signs Assessment: post-procedure vital signs reviewed and stable Respiratory status: spontaneous breathing, nonlabored ventilation, respiratory function stable and patient connected to nasal cannula oxygen Cardiovascular status: stable and blood pressure returned to baseline Postop Assessment: no apparent nausea or vomiting Anesthetic complications: no   No complications documented.  Sinda Du

## 2020-05-08 NOTE — Anesthesia Preprocedure Evaluation (Signed)
Anesthesia Evaluation  Patient identified by MRN, date of birth, ID band Patient awake    Reviewed: Allergy & Precautions, NPO status , Patient's Chart, lab work & pertinent test results  History of Anesthesia Complications (+) Family history of anesthesia reactionNegative for: history of anesthetic complications  Airway Mallampati: II  TM Distance: >3 FB Neck ROM: Full    Dental  (+) Poor Dentition   Pulmonary asthma , COPD, Current Smoker and Patient abstained from smoking.,    breath sounds clear to auscultation- rhonchi (-) wheezing      Cardiovascular Exercise Tolerance: Good hypertension, Pt. on medications + Peripheral Vascular Disease  (-) CAD, (-) Past MI, (-) Cardiac Stents and (-) CABG  Rhythm:Regular Rate:Normal - Systolic murmurs and - Diastolic murmurs    Neuro/Psych PSYCHIATRIC DISORDERS Anxiety Depression negative neurological ROS     GI/Hepatic Neg liver ROS, hiatal hernia, GERD  ,  Endo/Other  neg diabetesHypothyroidism   Renal/GU negative Renal ROS     Musculoskeletal  (+) Arthritis ,   Abdominal (+) - obese,   Peds  Hematology negative hematology ROS (+)   Anesthesia Other Findings Past Medical History: No date: Anxiety     Comment:  PANIC ATTACKS No date: Arthritis     Comment:  right shoulder 02/12/2015: Atherosclerosis of aorta (HCC) No date: COPD (chronic obstructive pulmonary disease) (HCC) No date: Cough No date: Depression No date: Environmental and seasonal allergies 02/12/2015: Essential hypertension No date: Family history of adverse reaction to anesthesia     Comment:  Father - 26 - MI, then stroke after Prostate surgery No date: GERD (gastroesophageal reflux disease) No date: Hyperlipidemia No date: Hypertension No date: Hypothyroidism No date: Incisional hernia, without obstruction or gangrene 11/17/2016: PVD (peripheral vascular disease) ([place]) No date: Thyroid  disease No date: Venous insufficiency No date: Vertigo     Comment:  can happen daily No date: Wears hearing aid in both ears     Comment:  has, does not wear   Reproductive/Obstetrics                             Anesthesia Physical  Anesthesia Plan  ASA: III  Anesthesia Plan: MAC   Post-op Pain Management:    Induction: Intravenous  PONV Risk Score and Plan: 1 and Midazolam  Airway Management Planned: Natural Airway and Nasal Cannula  Additional Equipment:   Intra-op Plan:   Post-operative Plan:   Informed Consent: I have reviewed the patients History and Physical, chart, labs and discussed the procedure including the risks, benefits and alternatives for the proposed anesthesia with the patient or authorized representative who has indicated his/her understanding and acceptance.     Dental advisory given  Plan Discussed with: CRNA and Anesthesiologist  Anesthesia Plan Comments:         Anesthesia Quick Evaluation  Patient Active Problem List   Diagnosis Date Noted  . Colon cancer screening   . Polyp of sigmoid colon   . SOBOE (shortness of breath on exertion) 07/30/2017  . PVD (peripheral vascular disease) ([place]) 11/17/2016  . Tobacco use disorder 11/17/2016  . Reactive airway disease, mild intermittent, uncomplicated 59/74/1638  . Allergic rhinitis due to pollen 08/13/2015  . Nocturia 08/13/2015  . Atherosclerosis of aorta ([place]) 02/12/2015  . Essential hypertension 02/12/2015  . Esophageal reflux 02/12/2015  . Depression 02/12/2015  . Adult hypothyroidism 02/12/2015  . Chronic anxiety 02/12/2015  . Hyperlipemia 02/12/2015    CBC Latest  Ref Rng & Units 11/11/2018 07/27/2017 11/02/2016  WBC 3.4 - 10.8 x10E3/uL 8.5 8.3 14.0(H)  Hemoglobin 13.0 - 17.7 g/dL 15.1 13.7 13.3  Hematocrit 37.5 - 51.0 % 44.1 41.1 38.6(L)  Platelets 150 - 450 x10E3/uL 322 413(H) 326   BMP Latest Ref Rng & Units 05/15/2019 11/11/2018 06/17/2018  Glucose 65  - 99 mg/dL 81 99 110(H)  BUN 8 - 27 mg/dL 18 - 15  Creatinine 0.76 - 1.27 mg/dL 0.75(L) - 0.81  BUN/Creat Ratio 10 - 24 24 - 19  Sodium 134 - 144 mmol/L 139 - 140  Potassium 3.5 - 5.2 mmol/L 4.8 - 4.4  Chloride 96 - 106 mmol/L 103 - 102  CO2 20 - 29 mmol/L 22 - 21  Calcium 8.6 - 10.2 mg/dL 9.4 - 9.5    Risks and benefits of anesthesia discussed at length, patient or surrogate demonstrates understanding. Appropriately NPO. Plan to proceed with anesthesia.  Champ Mungo, MD 05/08/20

## 2020-05-09 ENCOUNTER — Encounter: Payer: Self-pay | Admitting: Ophthalmology

## 2020-05-13 ENCOUNTER — Other Ambulatory Visit: Payer: Self-pay | Admitting: Family Medicine

## 2020-05-13 DIAGNOSIS — E039 Hypothyroidism, unspecified: Secondary | ICD-10-CM

## 2020-05-16 ENCOUNTER — Other Ambulatory Visit: Payer: Self-pay | Admitting: Family Medicine

## 2020-05-16 DIAGNOSIS — F419 Anxiety disorder, unspecified: Secondary | ICD-10-CM

## 2020-05-16 NOTE — Telephone Encounter (Signed)
Please review for refill. Refill not delegated per protocol.  LOV: 11/16/19 Last refill: 11/16/19 #15 with 5 refills

## 2020-05-16 NOTE — Telephone Encounter (Signed)
Copied from [place] [phone]. Topic: Quick Communication - Rx Refill/Question >> May 16, 2020  1:38 PM Leward Quan A wrote: Medication: clonazePAM (KLONOPIN) 0.5 MG tablet  Need the hard script due to an expired Rx   Has the patient contacted their pharmacy? Yes.   (Agent: If no, request that the patient contact the pharmacy for the refill.) (Agent: If yes, when and what did the pharmacy advise?)  Preferred Pharmacy (with phone number or street name): [id number] - [place], [address]  Phone:  [phone] Fax:  [phone]     Agent: Please be advised that RX refills may take up to 3 business days. We ask that you follow-up with your pharmacy.

## 2020-05-20 ENCOUNTER — Encounter: Payer: Self-pay | Admitting: Family Medicine

## 2020-05-20 ENCOUNTER — Other Ambulatory Visit: Payer: Self-pay

## 2020-05-20 ENCOUNTER — Ambulatory Visit: Payer: Medicare PPO | Admitting: Family Medicine

## 2020-05-20 VITALS — BP 140/90 | HR 72 | Ht 68.0 in | Wt 170.0 lb

## 2020-05-20 DIAGNOSIS — J209 Acute bronchitis, unspecified: Secondary | ICD-10-CM

## 2020-05-20 DIAGNOSIS — F17200 Nicotine dependence, unspecified, uncomplicated: Secondary | ICD-10-CM

## 2020-05-20 DIAGNOSIS — K219 Gastro-esophageal reflux disease without esophagitis: Secondary | ICD-10-CM

## 2020-05-20 DIAGNOSIS — E785 Hyperlipidemia, unspecified: Secondary | ICD-10-CM

## 2020-05-20 DIAGNOSIS — E039 Hypothyroidism, unspecified: Secondary | ICD-10-CM | POA: Diagnosis not present

## 2020-05-20 DIAGNOSIS — F419 Anxiety disorder, unspecified: Secondary | ICD-10-CM

## 2020-05-20 DIAGNOSIS — I700 Atherosclerosis of aorta: Secondary | ICD-10-CM

## 2020-05-20 DIAGNOSIS — J440 Chronic obstructive pulmonary disease with acute lower respiratory infection: Secondary | ICD-10-CM | POA: Diagnosis not present

## 2020-05-20 DIAGNOSIS — I10 Essential (primary) hypertension: Secondary | ICD-10-CM

## 2020-05-20 DIAGNOSIS — Z23 Encounter for immunization: Secondary | ICD-10-CM | POA: Diagnosis not present

## 2020-05-20 DIAGNOSIS — F3342 Major depressive disorder, recurrent, in full remission: Secondary | ICD-10-CM | POA: Diagnosis not present

## 2020-05-20 MED ORDER — LOSARTAN POTASSIUM 25 MG PO TABS: 50.0000 mg | ORAL_TABLET | Freq: Every day | ORAL | 1 refills | Status: DC

## 2020-05-20 MED ORDER — LEVOTHYROXINE SODIUM 112 MCG PO TABS: 112.0000 ug | ORAL_TABLET | Freq: Every day | ORAL | 1 refills | Status: DC

## 2020-05-20 MED ORDER — CLONAZEPAM 0.5 MG PO TABS: 0.2500 mg | ORAL_TABLET | Freq: Every day | ORAL | 5 refills | Status: DC

## 2020-05-20 MED ORDER — FLUTICASONE PROPIONATE 50 MCG/ACT NA SUSP: NASAL | 1 refills | Status: DC

## 2020-05-20 MED ORDER — ALBUTEROL SULFATE HFA 108 (90 BASE) MCG/ACT IN AERS: 2.0000 | INHALATION_SPRAY | Freq: Four times a day (QID) | RESPIRATORY_TRACT | 11 refills | Status: DC | PRN

## 2020-05-20 MED ORDER — DEXLANSOPRAZOLE 60 MG PO CPDR: 1.0000 | DELAYED_RELEASE_CAPSULE | Freq: Every day | ORAL | 1 refills | Status: DC

## 2020-05-20 MED ORDER — CLOPIDOGREL BISULFATE 75 MG PO TABS: 75.0000 mg | ORAL_TABLET | Freq: Every day | ORAL | 1 refills | Status: DC

## 2020-05-20 MED ORDER — CETIRIZINE HCL 10 MG PO TABS: 10.0000 mg | ORAL_TABLET | Freq: Every day | ORAL | 1 refills | Status: DC

## 2020-05-20 MED ORDER — ROSUVASTATIN CALCIUM 20 MG PO TABS: 20.0000 mg | ORAL_TABLET | Freq: Every day | ORAL | 1 refills | Status: DC

## 2020-05-20 MED ORDER — SERTRALINE HCL 100 MG PO TABS
100.0000 mg | ORAL_TABLET | Freq: Every day | ORAL | 1 refills | Status: DC
Start: 2020-05-20 — End: 2020-11-13

## 2020-05-20 NOTE — Progress Notes (Signed)
Date:  05/20/2020   Name:  Joel Flores   DOB:  [birth year]   MRN:  [id number]   Chief Complaint: COPD, Hyperlipidemia, Hypertension, Depression, Anxiety, Hypothyroidism, Gastroesophageal Reflux, Coronary Artery Disease, and Flu Vaccine  COPD There is no chest tightness, cough, difficulty breathing, frequent throat clearing, hemoptysis, hoarse voice, shortness of breath, sputum production or wheezing. This is a chronic problem. The current episode started more than 1 year ago. The problem occurs daily. The problem has been waxing and waning. Pertinent negatives include no appetite change, chest pain, dyspnea on exertion, ear congestion, ear pain, fever, headaches, heartburn, malaise/fatigue, myalgias, nasal congestion, orthopnea, PND, postnasal drip, rhinorrhea, sneezing, sore throat, sweats, trouble swallowing or weight loss. His symptoms are alleviated by beta-agonist. He reports moderate improvement on treatment. His past medical history is significant for COPD.  Hyperlipidemia This is a chronic problem. The current episode started more than 1 year ago. The problem is controlled. Recent lipid tests were reviewed and are variable. He has no history of chronic renal disease, diabetes, hypothyroidism, liver disease, obesity or nephrotic syndrome. There are no known factors aggravating his hyperlipidemia. Pertinent negatives include no chest pain, focal sensory loss, focal weakness, leg pain, myalgias or shortness of breath. Current antihyperlipidemic treatment includes statins. The current treatment provides moderate improvement of lipids. There are no compliance problems.  Risk factors for coronary artery disease include hypertension.  Hypertension This is a chronic problem. The current episode started more than 1 year ago. The problem has been gradually improving since onset. Associated symptoms include anxiety. Pertinent negatives include no blurred vision, chest pain, headaches, malaise/fatigue,  neck pain, orthopnea, palpitations, peripheral edema, PND, shortness of breath or sweats. There are no associated agents to hypertension. Risk factors for coronary artery disease include dyslipidemia and smoking/tobacco exposure. Past treatments include angiotensin blockers. The current treatment provides moderate improvement. There are no compliance problems.  There is no history of angina, kidney disease, CAD/MI, CVA, heart failure, left ventricular hypertrophy, PVD or retinopathy. Identifiable causes of hypertension include a thyroid problem. There is no history of chronic renal disease, a hypertension causing med or renovascular disease.  Depression        This is a chronic problem.  The onset quality is gradual.   The problem has been gradually improving since onset.  Associated symptoms include no decreased concentration, no fatigue, no helplessness, no hopelessness, does not have insomnia, not irritable, no restlessness, no decreased interest, no appetite change, no body aches, no myalgias, no headaches, no indigestion, not sad and no suicidal ideas.     The symptoms are aggravated by medication.  Past treatments include SSRIs - Selective serotonin reuptake inhibitors.  Past medical history includes thyroid problem and anxiety.     Pertinent negatives include no hypothyroidism. Anxiety Presents for follow-up visit. Symptoms include excessive worry, nervous/anxious behavior and panic. Patient reports no chest pain, compulsions, confusion, decreased concentration, depressed mood, dizziness, dry mouth, feeling of choking, hyperventilation, impotence, insomnia, irritability, malaise, muscle tension, nausea, obsessions, palpitations, restlessness, shortness of breath or suicidal ideas.   His past medical history is significant for CAD.  Gastroesophageal Reflux He reports no abdominal pain, no belching, no chest pain, no choking, no coughing, no dysphagia, no early satiety, no globus sensation, no  heartburn, no hoarse voice, no nausea, no sore throat, no stridor or no wheezing. This is a chronic problem. The problem occurs occasionally. The problem has been gradually improving. Pertinent negatives include no fatigue, muscle weakness, orthopnea or  weight loss.  Coronary Artery Disease Presents for follow-up visit. Pertinent negatives include no chest pain, chest pressure, chest tightness, dizziness, leg swelling, muscle weakness, palpitations, shortness of breath or weight gain. Risk factors include hyperlipidemia and hypertension. Risk factors do not include obesity. The symptoms have been stable.  Thyroid Problem Presents for follow-up visit. Symptoms include anxiety. Patient reports no cold intolerance, constipation, depressed mood, diaphoresis, diarrhea, dry skin, fatigue, hair loss, heat intolerance, hoarse voice, leg swelling, menstrual problem, nail problem, palpitations, tremors, visual change, weight gain or weight loss. The symptoms have been stable. His past medical history is significant for hyperlipidemia. There is no history of diabetes or heart failure.    Lab Results  Component Value Date   CREATININE 0.75 (L) 05/15/2019   BUN 18 05/15/2019   NA 139 05/15/2019   K 4.8 05/15/2019   CL 103 05/15/2019   CO2 22 05/15/2019   Lab Results  Component Value Date   CHOL 141 05/15/2019   HDL 48 05/15/2019   LDLCALC 72 05/15/2019   TRIG 118 05/15/2019   CHOLHDL 3.7 06/17/2018   Lab Results  Component Value Date   TSH 4.310 06/26/2019   No results found for: HGBA1C Lab Results  Component Value Date   WBC 8.5 11/11/2018   HGB 15.1 11/11/2018   HCT 44.1 11/11/2018   MCV 86 11/11/2018   PLT 322 11/11/2018   Lab Results  Component Value Date   ALT 21 05/15/2019   AST 22 05/15/2019   ALKPHOS 94 05/15/2019   BILITOT 0.3 05/15/2019     Review of Systems  Constitutional: Negative for appetite change, chills, diaphoresis, fatigue, fever, irritability, malaise/fatigue,  weight gain and weight loss.  HENT: Negative for drooling, ear discharge, ear pain, hoarse voice, postnasal drip, rhinorrhea, sneezing, sore throat and trouble swallowing.   Eyes: Negative for blurred vision.  Respiratory: Negative for cough, hemoptysis, sputum production, choking, chest tightness, shortness of breath and wheezing.   Cardiovascular: Negative for chest pain, dyspnea on exertion, palpitations, orthopnea, leg swelling and PND.  Gastrointestinal: Negative for abdominal pain, blood in stool, constipation, diarrhea, dysphagia, heartburn and nausea.  Endocrine: Negative for cold intolerance, heat intolerance and polydipsia.  Genitourinary: Negative for dysuria, frequency, hematuria, impotence, menstrual problem and urgency.  Musculoskeletal: Negative for back pain, myalgias, muscle weakness and neck pain.  Skin: Negative for rash.  Allergic/Immunologic: Negative for environmental allergies.  Neurological: Negative for dizziness, tremors, focal weakness and headaches.  Hematological: Does not bruise/bleed easily.  Psychiatric/Behavioral: Positive for depression. Negative for confusion, decreased concentration and suicidal ideas. The patient is nervous/anxious. The patient does not have insomnia.     Patient Active Problem List   Diagnosis Date Noted  . Colon cancer screening   . Polyp of sigmoid colon   . SOBOE (shortness of breath on exertion) 07/30/2017  . PVD (peripheral vascular disease) ([place]) 11/17/2016  . Tobacco use disorder 11/17/2016  . Reactive airway disease, mild intermittent, uncomplicated [id number]  . Allergic rhinitis due to pollen 08/13/2015  . Nocturia 08/13/2015  . Atherosclerosis of aorta ([place]) 02/12/2015  . Essential hypertension 02/12/2015  . Esophageal reflux 02/12/2015  . Depression 02/12/2015  . Adult hypothyroidism 02/12/2015  . Chronic anxiety 02/12/2015  . Hyperlipemia 02/12/2015    Allergies  Allergen Reactions  . Sulfa Antibiotics Rash     Past Surgical History:  Procedure Laterality Date  . CATARACT EXTRACTION W/PHACO Right 03/16/2018   Procedure: CATARACT EXTRACTION PHACO AND INTRAOCULAR LENS PLACEMENT (IOC);  Surgeon: Marchia Meiers,  MD;  Location: [facility];  Service: Ophthalmology;  Laterality: Right;  [place]  01:05 CDE 11.85 Fluid pack lot # [id number]  . CATARACT EXTRACTION W/PHACO Left 05/08/2020   Procedure: CATARACT EXTRACTION PHACO AND INTRAOCULAR LENS PLACEMENT (IOC) LEFT 7.32 00:54.7 13.4%;  Surgeon: Leandrew Koyanagi, MD;  Location: [facility];  Service: Ophthalmology;  Laterality: Left;  . CHOLECYSTECTOMY    . COLONOSCOPY  2015   Dr Allen Norris- cleared for 5 years   . COLONOSCOPY WITH PROPOFOL N/A 01/17/2018   Procedure: COLONOSCOPY WITH PROPOFOL;  Surgeon: Lucilla Lame, MD;  Location: [facility];  Service: Endoscopy;  Laterality: N/A;  requests early  . INSERTION OF MESH N/A 08/31/2017   Procedure: INSERTION OF MESH;  Surgeon: Vickie Epley, MD;  Location: [facility];  Service: General;  Laterality: N/A;  . POLYPECTOMY  01/17/2018   Procedure: POLYPECTOMY;  Surgeon: Lucilla Lame, MD;  Location: [facility];  Service: Endoscopy;;  . UMBILICAL HERNIA REPAIR N/A 08/31/2017   Procedure: HERNIA REPAIR UMBILICAL ADULT;  Surgeon: Vickie Epley, MD;  Location: [facility];  Service: General;  Laterality: N/A;  . VEIN SURGERY     2 stents in R) leg    Social History   Tobacco Use  . Smoking status: Current Every Day Smoker    Packs/day: 1.50    Years: 50.00    Pack years: 75.00  . Smokeless tobacco: Never Used  . Tobacco comment: pt wants to try patches again but states they are too costly  Vaping Use  . Vaping Use: Never used  Substance Use Topics  . Alcohol use: Yes    Alcohol/week: 1.0 standard drink    Types: 1 Cans of beer per week  . Drug use: No     Medication list has been reviewed and updated.  Current Meds  Medication Sig  . acetaminophen (TYLENOL) 325 MG tablet Take 325  mg by mouth daily as needed for moderate pain or headache.  . albuterol (VENTOLIN HFA) 108 (90 Base) MCG/ACT inhaler Inhale 2 puffs into the lungs every 6 (six) hours as needed for wheezing or shortness of breath.  . cetirizine (ZYRTEC) 10 MG tablet Take 1 tablet (10 mg total) by mouth daily.  . clonazePAM (KLONOPIN) 0.5 MG tablet Take 0.5 tablets (0.25 mg total) by mouth daily. Take one half daily  . clopidogrel (PLAVIX) 75 MG tablet Take 1 tablet (75 mg total) by mouth daily.  Marland Kitchen dexlansoprazole (DEXILANT) 60 MG capsule Take 1 capsule (60 mg total) by mouth daily.  . fluticasone (FLONASE) 50 MCG/ACT nasal spray SPRAY 2 SPRAYS INTO EACH NOSTRIL EVERY DAY  . ibuprofen (ADVIL,MOTRIN) 200 MG tablet Take 400 mg by mouth every 8 (eight) hours as needed (for pain).  Marland Kitchen levothyroxine (SYNTHROID) 112 MCG tablet TAKE 1 TABLET BY MOUTH EVERY DAY  . losartan (COZAAR) 25 MG tablet Take 2 tablets (50 mg total) by mouth daily.  . rosuvastatin (CRESTOR) 20 MG tablet Take 1 tablet (20 mg total) by mouth daily.  . sertraline (ZOLOFT) 100 MG tablet Take 1 tablet (100 mg total) by mouth daily.    PHQ 2/9 Scores 05/20/2020 11/16/2019 07/24/2019 06/26/2019  PHQ - 2 Score 0 0 0 0  PHQ- 9 Score 0 0 - 0    GAD 7 : Generalized Anxiety Score 05/20/2020 11/16/2019 06/26/2019 05/15/2019  Nervous, Anxious, on Edge 0 3 0 0  Control/stop worrying 0 0 0 0  Worry too much - different things 0 0 0 0  Trouble relaxing 0 0 0 0  Restless 0 0 0 0  Easily annoyed or irritable 0 0 0 2  Afraid - awful might happen 0 1 0 1  Total GAD 7 Score 0 4 0 3  Anxiety Difficulty - Not difficult at all - Not difficult at all    BP Readings from Last 3 Encounters:  05/20/20 140/90  05/08/20 136/88  11/16/19 126/84    Physical Exam Vitals and nursing note reviewed.  Constitutional:      General: He is not irritable. HENT:     Head: Normocephalic.     Right Ear: Tympanic membrane, ear canal and external ear normal. There is no impacted  cerumen.     Left Ear: Tympanic membrane, ear canal and external ear normal. There is no impacted cerumen.     Nose: No congestion or rhinorrhea.     Mouth/Throat:     Mouth: Oropharynx is clear and moist. Mucous membranes are moist.  Eyes:     General: No scleral icterus.       Right eye: No discharge.        Left eye: No discharge.     Extraocular Movements: EOM normal.     Conjunctiva/sclera: Conjunctivae normal.     Pupils: Pupils are equal, round, and reactive to light.  Neck:     Thyroid: No thyromegaly.     Vascular: No JVD.     Trachea: No tracheal deviation.  Cardiovascular:     Rate and Rhythm: Normal rate and regular rhythm.     Pulses: Intact distal pulses.     Heart sounds: Normal heart sounds. No murmur heard. No friction rub. No gallop.   Pulmonary:     Effort: No respiratory distress.     Breath sounds: Normal breath sounds. No wheezing, rhonchi or rales.  Abdominal:     General: Bowel sounds are normal.     Palpations: Abdomen is soft. There is no hepatosplenomegaly or mass.     Tenderness: There is no abdominal tenderness. There is no CVA tenderness, guarding or rebound.  Musculoskeletal:        General: No tenderness or edema. Normal range of motion.     Cervical back: Normal range of motion and neck supple.  Lymphadenopathy:     Cervical: No cervical adenopathy.  Skin:    General: Skin is warm.     Findings: No rash.  Neurological:     Mental Status: He is alert and oriented to person, place, and time.     Cranial Nerves: No cranial nerve deficit.     Deep Tendon Reflexes: Strength normal and reflexes are normal and symmetric.     Wt Readings from Last 3 Encounters:  05/20/20 170 lb (77.1 kg)  05/08/20 170 lb (77.1 kg)  11/16/19 171 lb (77.6 kg)    BP 140/90   Pulse 72   Ht 5\' 8"  (1.727 m)   Wt 170 lb (77.1 kg)   BMI 25.85 kg/m   Assessment and Plan: 1. Chronic anxiety Chronic.  Controlled.  Stable.  Gad score 0.  Patient will continue on  sertraline 100 mg daily with an occasional clonazepam for panic attacks. - clonazePAM (KLONOPIN) 0.5 MG tablet; Take 0.5 tablets (0.25 mg total) by mouth daily. Take one half daily  Dispense: 15 tablet; Refill: 5 - Thyroid Panel With TSH  2. Atherosclerosis of aorta (HCC) Chronic.  Controlled.  Stable.  Will continue Plavix 75 mg once a day and rosuvastatin 20  mg once a day.  Will check lipid panel for current lipid status. - clopidogrel (PLAVIX) 75 MG tablet; Take 1 tablet (75 mg total) by mouth daily.  Dispense: 90 tablet; Refill: 1 - rosuvastatin (CRESTOR) 20 MG tablet; Take 1 tablet (20 mg total) by mouth daily.  Dispense: 90 tablet; Refill: 1 - Lipid Panel With LDL/HDL Ratio  3. Gastroesophageal reflux disease, unspecified whether esophagitis present Chronic.  Controlled.  Stable.  Patient will continue Dexilant 60 mg once a day. - dexlansoprazole (DEXILANT) 60 MG capsule; Take 1 capsule (60 mg total) by mouth daily.  Dispense: 90 capsule; Refill: 1  4. Adult hypothyroidism Chronic.  Controlled.  Stable.  Will check thyroid panel with TSH and likely will continue levothyroxine 112 mcg daily. - levothyroxine (SYNTHROID) 112 MCG tablet; Take 1 tablet (112 mcg total) by mouth daily.  Dispense: 90 tablet; Refill: 1 - Thyroid Panel With TSH  5. Essential hypertension Chronic.  Controlled.  Stable.  Blood pressure is 140/90.  We will continue losartan 25 mg 2 tablets daily. - losartan (COZAAR) 25 MG tablet; Take 2 tablets (50 mg total) by mouth daily.  Dispense: 180 tablet; Refill: 1 - Comprehensive Metabolic Panel (CMET)  6. Recurrent major depressive disorder, in full remission ([place]) Chronic.  Controlled.  Stable.  PHQ 0 gad score 0 continue sertraline 100 mg daily. - sertraline (ZOLOFT) 100 MG tablet; Take 1 tablet (100 mg total) by mouth daily.  Dispense: 90 tablet; Refill: 1  7. Acute bronchitis with chronic obstructive pulmonary disease (COPD) (HCC) Chronic.  Controlled.  Stable.   Patient continues to smoke which we have discussed again rather doubt that he is going to be able to quit at this time.  We will continue his albuterol 1 to 2 puffs bronchodilation on a as needed basis. - albuterol (VENTOLIN HFA) 108 (90 Base) MCG/ACT inhaler; Inhale 2 puffs into the lungs every 6 (six) hours as needed for wheezing or shortness of breath.  Dispense: 8 g; Refill: 11  8. Tobacco use disorder Patient has been advised of the health risks of smoking and counseled concerning cessation of tobacco products. I spent over 3 minutes for discussion and to answer questions.  9. Hyperlipidemia, unspecified hyperlipidemia type Chronic.  Controlled.  Stable.  Continue rosuvastatin 20 mg once a day. - rosuvastatin (CRESTOR) 20 MG tablet; Take 1 tablet (20 mg total) by mouth daily.  Dispense: 90 tablet; Refill: 1 - Lipid Panel With LDL/HDL Ratio  10. Need for immunization against influenza Discussion and administration. - Flu Vaccine QUAD High Dose(Fluad)

## 2020-05-21 ENCOUNTER — Other Ambulatory Visit: Payer: Self-pay

## 2020-05-21 DIAGNOSIS — E039 Hypothyroidism, unspecified: Secondary | ICD-10-CM

## 2020-05-21 LAB — COMPREHENSIVE METABOLIC PANEL
ALT: 19 IU/L (ref 0–44)
AST: 24 IU/L (ref 0–40)
Albumin/Globulin Ratio: 1.5 (ref 1.2–2.2)
Albumin: 4.5 g/dL (ref 3.8–4.8)
Alkaline Phosphatase: 98 IU/L (ref 44–121)
BUN/Creatinine Ratio: 14 (ref 10–24)
BUN: 11 mg/dL (ref 8–27)
Bilirubin Total: 0.4 mg/dL (ref 0.0–1.2)
CO2: 23 mmol/L (ref 20–29)
Calcium: 9.6 mg/dL (ref 8.6–10.2)
Chloride: 101 mmol/L (ref 96–106)
Creatinine, Ser: 0.77 mg/dL (ref 0.76–1.27)
GFR calc Af Amer: 106 mL/min/{1.73_m2} (ref >59)
GFR calc non Af Amer: 92 mL/min/{1.73_m2} (ref >59)
Globulin, Total: 3 g/dL (ref 1.5–4.5)
Glucose: 107 mg/dL — ABNORMAL HIGH (ref 65–99)
Potassium: 3.9 mmol/L (ref 3.5–5.2)
Sodium: 140 mmol/L (ref 134–144)
Total Protein: 7.5 g/dL (ref 6.0–8.5)

## 2020-05-21 LAB — LIPID PANEL WITH LDL/HDL RATIO
Cholesterol, Total: 145 mg/dL (ref 100–199)
HDL: 37 mg/dL — ABNORMAL LOW (ref >39)
LDL Chol Calc (NIH): 81 mg/dL (ref 0–99)
LDL/HDL Ratio: 2.2 ratio (ref 0.0–3.6)
Triglycerides: 156 mg/dL — ABNORMAL HIGH (ref 0–149)
VLDL Cholesterol Cal: 27 mg/dL (ref 5–40)

## 2020-05-21 LAB — THYROID PANEL WITH TSH
Free Thyroxine Index: 2 (ref 1.2–4.9)
T3 Uptake Ratio: 25 % (ref 24–39)
T4, Total: 8.1 ug/dL (ref 4.5–12.0)
TSH: 9.73 u[IU]/mL — ABNORMAL HIGH (ref 0.450–4.500)

## 2020-05-21 MED ORDER — LEVOTHYROXINE SODIUM 125 MCG PO TABS: 125.0000 ug | ORAL_TABLET | Freq: Every day | ORAL | 1 refills | Status: DC

## 2020-05-27 ENCOUNTER — Other Ambulatory Visit: Payer: Self-pay

## 2020-05-27 ENCOUNTER — Encounter: Payer: Self-pay | Admitting: Family Medicine

## 2020-05-27 ENCOUNTER — Ambulatory Visit: Payer: Self-pay | Admitting: *Deleted

## 2020-05-27 ENCOUNTER — Ambulatory Visit: Payer: Medicare PPO | Admitting: Family Medicine

## 2020-05-27 VITALS — BP 142/90 | HR 84 | Ht 68.0 in | Wt 169.0 lb

## 2020-05-27 DIAGNOSIS — E039 Hypothyroidism, unspecified: Secondary | ICD-10-CM | POA: Diagnosis not present

## 2020-05-27 DIAGNOSIS — I10 Essential (primary) hypertension: Secondary | ICD-10-CM | POA: Diagnosis not present

## 2020-05-27 MED ORDER — LOSARTAN POTASSIUM 100 MG PO TABS: 100.0000 mg | ORAL_TABLET | Freq: Every day | ORAL | 0 refills | Status: DC

## 2020-05-27 NOTE — Patient Instructions (Signed)
Managing Your Hypertension Hypertension, also called high blood pressure, is when the force of the blood pressing against the walls of the arteries is too strong. Arteries are blood vessels that carry blood from your heart throughout your body. Hypertension forces the heart to work harder to pump blood and may cause the arteries to become narrow or stiff. Understanding blood pressure readings Your personal target blood pressure may vary depending on your medical conditions, your age, and other factors. A blood pressure reading includes a higher number over a lower number. Ideally, your blood pressure should be below 120/80. You should know that:  The first, or top, number is called the systolic pressure. It is a measure of the pressure in your arteries as your heart beats.  The second, or bottom number, is called the diastolic pressure. It is a measure of the pressure in your arteries as the heart relaxes. Blood pressure is classified into four stages. Based on your blood pressure reading, your health care provider may use the following stages to determine what type of treatment you need, if any. Systolic pressure and diastolic pressure are measured in a unit called mmHg. Normal  Systolic pressure: below 120.  Diastolic pressure: below 80. Elevated  Systolic pressure: 120-129.  Diastolic pressure: below 80. Hypertension stage 1  Systolic pressure: 130-139.  Diastolic pressure: 80-89. Hypertension stage 2  Systolic pressure: 140 or above.  Diastolic pressure: 90 or above. How can this condition affect me? Managing your hypertension is an important responsibility. Over time, hypertension can damage the arteries and decrease blood flow to important parts of the body, including the brain, heart, and kidneys. Having untreated or uncontrolled hypertension can lead to:  A heart attack.  A stroke.  A weakened blood vessel (aneurysm).  Heart failure.  Kidney damage.  Eye  damage.  Metabolic syndrome.  Memory and concentration problems.  Vascular dementia. What actions can I take to manage this condition? Hypertension can be managed by making lifestyle changes and possibly by taking medicines. Your health care provider will help you make a plan to bring your blood pressure within a normal range. Nutrition  Eat a diet that is high in fiber and potassium, and low in salt (sodium), added sugar, and fat. An example eating plan is called the Dietary Approaches to Stop Hypertension (DASH) diet. To eat this way: ? Eat plenty of fresh fruits and vegetables. Try to fill one-half of your plate at each meal with fruits and vegetables. ? Eat whole grains, such as whole-wheat pasta, brown rice, or whole-grain bread. Fill about one-fourth of your plate with whole grains. ? Eat low-fat dairy products. ? Avoid fatty cuts of meat, processed or cured meats, and poultry with skin. Fill about one-fourth of your plate with lean proteins such as fish, chicken without skin, beans, eggs, and tofu. ? Avoid pre-made and processed foods. These tend to be higher in sodium, added sugar, and fat.  Reduce your daily sodium intake. Most people with hypertension should eat less than 1,500 mg of sodium a day.   Lifestyle  Work with your health care provider to maintain a healthy body weight or to lose weight. Ask what an ideal weight is for you.  Get at least 30 minutes of exercise that causes your heart to beat faster (aerobic exercise) most days of the week. Activities may include walking, swimming, or biking.  Include exercise to strengthen your muscles (resistance exercise), such as weight lifting, as part of your weekly exercise routine. Try   to do these types of exercises for 30 minutes at least 3 days a week.  Do not use any products that contain nicotine or tobacco, such as cigarettes, e-cigarettes, and chewing tobacco. If you need help quitting, ask your health care  provider.  Control any long-term (chronic) conditions you have, such as high cholesterol or diabetes.  Identify your sources of stress and find ways to manage stress. This may include meditation, deep breathing, or making time for fun activities.   Alcohol use  Do not drink alcohol if: ? Your health care provider tells you not to drink. ? You are pregnant, may be pregnant, or are planning to become pregnant.  If you drink alcohol: ? Limit how much you use to:  0-1 drink a day for women.  0-2 drinks a day for men. ? Be aware of how much alcohol is in your drink. In the [place] one drink equals one 12 oz bottle of beer (355 mL), one 5 oz glass of wine (148 mL), or one 1 oz glass of hard liquor (44 mL). Medicines Your health care provider may prescribe medicine if lifestyle changes are not enough to get your blood pressure under control and if:  Your systolic blood pressure is 130 or higher.  Your diastolic blood pressure is 80 or higher. Take medicines only as told by your health care provider. Follow the directions carefully. Blood pressure medicines must be taken as told by your health care provider. The medicine does not work as well when you skip doses. Skipping doses also puts you at risk for problems. Monitoring Before you monitor your blood pressure:  Do not smoke, drink caffeinated beverages, or exercise within 30 minutes before taking a measurement.  Use the bathroom and empty your bladder (urinate).  Sit quietly for at least 5 minutes before taking measurements. Monitor your blood pressure at home as told by your health care provider. To do this:  Sit with your back straight and supported.  Place your feet flat on the floor. Do not cross your legs.  Support your arm on a flat surface, such as a table. Make sure your upper arm is at heart level.  Each time you measure, take two or three readings one minute apart and record the results. You may also need to have your  blood pressure checked regularly by your health care provider.   General information  Talk with your health care provider about your diet, exercise habits, and other lifestyle factors that may be contributing to hypertension.  Review all the medicines you take with your health care provider because there may be side effects or interactions.  Keep all visits as told by your health care provider. Your health care provider can help you create and adjust your plan for managing your high blood pressure. Where to find more information  National Heart, Lung, and Blood Institute: www.nhlbi.nih.gov  American Heart Association: www.heart.org Contact a health care provider if:  You think you are having a reaction to medicines you have taken.  You have repeated (recurrent) headaches.  You feel dizzy.  You have swelling in your ankles.  You have trouble with your vision. Get help right away if:  You develop a severe headache or confusion.  You have unusual weakness or numbness, or you feel faint.  You have severe pain in your chest or abdomen.  You vomit repeatedly.  You have trouble breathing. These symptoms may represent a serious problem that is an emergency. Do not wait   to see if the symptoms will go away. Get medical help right away. Call your local emergency services (911 in the [place] Do not drive yourself to the hospital. Summary  Hypertension is when the force of blood pumping through your arteries is too strong. If this condition is not controlled, it may put you at risk for serious complications.  Your personal target blood pressure may vary depending on your medical conditions, your age, and other factors. For most people, a normal blood pressure is less than 120/80.  Hypertension is managed by lifestyle changes, medicines, or both.  Lifestyle changes to help manage hypertension include losing weight, eating a healthy, low-sodium diet, exercising more, stopping smoking, and  limiting alcohol. This information is not intended to replace advice given to you by your health care provider. Make sure you discuss any questions you have with your health care provider. Document Revised: 05/19/2019 Document Reviewed: 03/14/2019 Elsevier Patient Education  2021 Elsevier Inc.  How to Take Your Blood Pressure Blood pressure measures how strongly your blood is pressing against the walls of your arteries. Arteries are blood vessels that carry blood from your heart throughout your body. You can take your blood pressure at home with a machine. You may need to check your blood pressure at home:  To check if you have high blood pressure (hypertension).  To check your blood pressure over time.  To make sure your blood pressure medicine is working. Supplies needed:  Blood pressure machine, or monitor.  Dining room chair to sit in.  Table or desk.  Small notebook.  Pencil or pen. How to prepare Avoid these things for 30 minutes before checking your blood pressure:  Having drinks with caffeine in them, such as coffee or tea.  Drinking alcohol.  Eating.  Smoking.  Exercising. Do these things five minutes before checking your blood pressure:  Go to the bathroom and pee (urinate).  Sit in a dining chair. Do not sit in a soft couch or an armchair.  Be quiet. Do not talk. How to take your blood pressure Follow the instructions that came with your machine. If you have a digital blood pressure monitor, these may be the instructions: 1. Sit up straight. 2. Place your feet on the floor. Do not cross your ankles or legs. 3. Rest your left arm at the level of your heart. You may rest it on a table, desk, or chair. 4. Pull up your shirt sleeve. 5. Wrap the blood pressure cuff around the upper part of your left arm. The cuff should be 1 inch (2.5 cm) above your elbow. It is best to wrap the cuff around bare skin. 6. Fit the cuff snugly around your arm. You should be able  to place only one finger between the cuff and your arm. 7. Place the cord so that it rests in the bend of your elbow. 8. Press the power button. 9. Sit quietly while the cuff fills with air and loses air. 10. Write down the numbers on the screen. 11. Wait 2-3 minutes and then repeat steps 1-10.   What do the numbers mean? Two numbers make up your blood pressure. The first number is called systolic pressure. The second is called diastolic pressure. An example of a blood pressure reading is "120 over 80" (or 120/80). If you are an adult and do not have a medical condition, use this guide to find out if your blood pressure is normal: Normal  First number: below 120.  Second   number: below 80. Elevated  First number: 120-129.  Second number: below 80. Hypertension stage 1  First number: 130-139.  Second number: 80-89. Hypertension stage 2  First number: 140 or above.  Second number: 90 or above. Your blood pressure is above normal even if only the top or bottom number is above normal. Follow these instructions at home:  Check your blood pressure as often as your doctor tells you to.  Check your blood pressure at the same time every day.  Take your monitor to your next doctor's appointment. Your doctor will: ? Make sure you are using it correctly. ? Make sure it is working right.  Make sure you understand what your blood pressure numbers should be.  Tell your doctor if your medicine is causing side effects.  Keep all follow-up visits as told by your doctor. This is important. General tips:  You will need a blood pressure machine, or monitor. Your doctor can suggest a monitor. You can buy one at a drugstore or online. When choosing one: ? Choose one with an arm cuff. ? Choose one that wraps around your upper arm. Only one finger should fit between your arm and the cuff. ? Do not choose one that measures your blood pressure from your wrist or finger. Where to find more  information American Heart Association: www.heart.org Contact a doctor if:  Your blood pressure keeps being high. Get help right away if:  Your first blood pressure number is higher than 180.  Your second blood pressure number is higher than 120. Summary  Check your blood pressure at the same time every day.  Avoid caffeine, alcohol, smoking, and exercise for 30 minutes before checking your blood pressure.  Make sure you understand what your blood pressure numbers should be. This information is not intended to replace advice given to you by your health care provider. Make sure you discuss any questions you have with your health care provider. Document Revised: 04/07/2019 Document Reviewed: 04/07/2019 Elsevier Patient Education  2021 Elsevier Inc.  

## 2020-05-27 NOTE — Telephone Encounter (Signed)
Coming in this afternoon 

## 2020-05-27 NOTE — Progress Notes (Signed)
Date:  05/27/2020   Name:  Joel Flores   DOB:  [birth year]   MRN:  [id number]   Chief Complaint: Hypertension (Bp has been getting higher over the weekend)  Hypertension This is a chronic problem. The current episode started more than 1 year ago. The problem has been waxing and waning since onset. The problem is uncontrolled. Pertinent negatives include no chest pain, headaches, neck pain, orthopnea, palpitations, PND or shortness of breath. Past treatments include angiotensin blockers. The current treatment provides mild improvement.    Lab Results  Component Value Date   CREATININE 0.77 05/20/2020   BUN 11 05/20/2020   NA 140 05/20/2020   K 3.9 05/20/2020   CL 101 05/20/2020   CO2 23 05/20/2020   Lab Results  Component Value Date   CHOL 145 05/20/2020   HDL 37 (L) 05/20/2020   LDLCALC 81 05/20/2020   TRIG 156 (H) 05/20/2020   CHOLHDL 3.7 06/17/2018   Lab Results  Component Value Date   TSH 9.730 (H) 05/20/2020   No results found for: HGBA1C Lab Results  Component Value Date   WBC 8.5 11/11/2018   HGB 15.1 11/11/2018   HCT 44.1 11/11/2018   MCV 86 11/11/2018   PLT 322 11/11/2018   Lab Results  Component Value Date   ALT 19 05/20/2020   AST 24 05/20/2020   ALKPHOS 98 05/20/2020   BILITOT 0.4 05/20/2020     Review of Systems  Constitutional: Negative for chills and fever.  HENT: Negative for drooling, ear discharge, ear pain and sore throat.   Respiratory: Negative for cough, shortness of breath and wheezing.   Cardiovascular: Negative for chest pain, palpitations, orthopnea, leg swelling and PND.  Gastrointestinal: Negative for abdominal pain, blood in stool, constipation, diarrhea and nausea.  Endocrine: Negative for polydipsia.  Genitourinary: Negative for dysuria, frequency, hematuria and urgency.  Musculoskeletal: Negative for back pain, myalgias and neck pain.  Skin: Negative for rash.  Allergic/Immunologic: Negative for environmental allergies.   Neurological: Negative for dizziness and headaches.  Hematological: Does not bruise/bleed easily.  Psychiatric/Behavioral: Negative for suicidal ideas. The patient is not nervous/anxious.     Patient Active Problem List   Diagnosis Date Noted  . Colon cancer screening   . Polyp of sigmoid colon   . SOBOE (shortness of breath on exertion) 07/30/2017  . PVD (peripheral vascular disease) ([place]) 11/17/2016  . Tobacco use disorder 11/17/2016  . Reactive airway disease, mild intermittent, uncomplicated 31/51/7616  . Allergic rhinitis due to pollen 08/13/2015  . Nocturia 08/13/2015  . Atherosclerosis of aorta ([place]) 02/12/2015  . Essential hypertension 02/12/2015  . Esophageal reflux 02/12/2015  . Depression 02/12/2015  . Adult hypothyroidism 02/12/2015  . Chronic anxiety 02/12/2015  . Hyperlipemia 02/12/2015    Allergies  Allergen Reactions  . Sulfa Antibiotics Rash    Past Surgical History:  Procedure Laterality Date  . CATARACT EXTRACTION W/PHACO Right 03/16/2018   Procedure: CATARACT EXTRACTION PHACO AND INTRAOCULAR LENS PLACEMENT (IOC);  Surgeon: Marchia Meiers, MD;  Location: [facility];  Service: Ophthalmology;  Laterality: Right;  [place]  01:05 CDE 11.85 Fluid pack lot # [id number]  . CATARACT EXTRACTION W/PHACO Left 05/08/2020   Procedure: CATARACT EXTRACTION PHACO AND INTRAOCULAR LENS PLACEMENT (IOC) LEFT 7.32 00:54.7 13.4%;  Surgeon: Leandrew Koyanagi, MD;  Location: [place];  Service: Ophthalmology;  Laterality: Left;  . CHOLECYSTECTOMY    . COLONOSCOPY  2015   Dr Allen Norris- cleared for 5 years   . COLONOSCOPY WITH  PROPOFOL N/A 01/17/2018   Procedure: COLONOSCOPY WITH PROPOFOL;  Surgeon: Lucilla Lame, MD;  Location: [place];  Service: Endoscopy;  Laterality: N/A;  requests early  . INSERTION OF MESH N/A 08/31/2017   Procedure: INSERTION OF MESH;  Surgeon: Vickie Epley, MD;  Location: [facility];  Service: General;  Laterality: N/A;  . POLYPECTOMY   01/17/2018   Procedure: POLYPECTOMY;  Surgeon: Lucilla Lame, MD;  Location: [place];  Service: Endoscopy;;  . UMBILICAL HERNIA REPAIR N/A 08/31/2017   Procedure: HERNIA REPAIR UMBILICAL ADULT;  Surgeon: Vickie Epley, MD;  Location: [facility];  Service: General;  Laterality: N/A;  . VEIN SURGERY     2 stents in R) leg    Social History   Tobacco Use  . Smoking status: Current Every Day Smoker    Packs/day: 1.50    Years: 50.00    Pack years: 75.00  . Smokeless tobacco: Never Used  . Tobacco comment: pt wants to try patches again but states they are too costly  Vaping Use  . Vaping Use: Never used  Substance Use Topics  . Alcohol use: Yes    Alcohol/week: 1.0 standard drink    Types: 1 Cans of beer per week  . Drug use: No     Medication list has been reviewed and updated.  Current Meds  Medication Sig  . acetaminophen (TYLENOL) 325 MG tablet Take 325 mg by mouth daily as needed for moderate pain or headache.  . albuterol (VENTOLIN HFA) 108 (90 Base) MCG/ACT inhaler Inhale 2 puffs into the lungs every 6 (six) hours as needed for wheezing or shortness of breath.  . cetirizine (ZYRTEC) 10 MG tablet Take 1 tablet (10 mg total) by mouth daily.  . clonazePAM (KLONOPIN) 0.5 MG tablet Take 0.5 tablets (0.25 mg total) by mouth daily. Take one half daily  . clopidogrel (PLAVIX) 75 MG tablet Take 1 tablet (75 mg total) by mouth daily.  Marland Kitchen dexlansoprazole (DEXILANT) 60 MG capsule Take 1 capsule (60 mg total) by mouth daily.  . fluticasone (FLONASE) 50 MCG/ACT nasal spray SPRAY 2 SPRAYS INTO EACH NOSTRIL EVERY DAY  . ibuprofen (ADVIL,MOTRIN) 200 MG tablet Take 400 mg by mouth every 8 (eight) hours as needed (for pain).  Marland Kitchen levothyroxine (SYNTHROID) 125 MCG tablet Take 1 tablet (125 mcg total) by mouth daily.  Marland Kitchen losartan (COZAAR) 25 MG tablet Take 2 tablets (50 mg total) by mouth daily.  . rosuvastatin (CRESTOR) 20 MG tablet Take 1 tablet (20 mg total) by mouth daily.  .  sertraline (ZOLOFT) 100 MG tablet Take 1 tablet (100 mg total) by mouth daily.    PHQ 2/9 Scores 05/20/2020 11/16/2019 07/24/2019 06/26/2019  PHQ - 2 Score 0 0 0 0  PHQ- 9 Score 0 0 - 0    GAD 7 : Generalized Anxiety Score 05/20/2020 11/16/2019 06/26/2019 05/15/2019  Nervous, Anxious, on Edge 0 3 0 0  Control/stop worrying 0 0 0 0  Worry too much - different things 0 0 0 0  Trouble relaxing 0 0 0 0  Restless 0 0 0 0  Easily annoyed or irritable 0 0 0 2  Afraid - awful might happen 0 1 0 1  Total GAD 7 Score 0 4 0 3  Anxiety Difficulty - Not difficult at all - Not difficult at all    BP Readings from Last 3 Encounters:  05/27/20 (!) 142/90  05/20/20 140/90  05/08/20 136/88    Physical Exam Vitals and nursing  note reviewed.  HENT:     Head: Normocephalic.     Right Ear: Tympanic membrane, ear canal and external ear normal.     Left Ear: Tympanic membrane, ear canal and external ear normal.     Nose: Nose normal. No congestion or rhinorrhea.     Mouth/Throat:     Mouth: Oropharynx is clear and moist. Mucous membranes are moist.  Eyes:     General: No scleral icterus.       Right eye: No discharge.        Left eye: No discharge.     Extraocular Movements: EOM normal.     Conjunctiva/sclera: Conjunctivae normal.     Pupils: Pupils are equal, round, and reactive to light.  Neck:     Thyroid: No thyromegaly.     Vascular: No JVD.     Trachea: No tracheal deviation.  Cardiovascular:     Rate and Rhythm: Normal rate and regular rhythm.     Pulses: Intact distal pulses.     Heart sounds: Normal heart sounds. No murmur heard. No friction rub. No gallop.   Pulmonary:     Effort: No respiratory distress.     Breath sounds: Normal breath sounds. No wheezing, rhonchi or rales.  Chest:     Chest wall: No tenderness.  Abdominal:     General: Bowel sounds are normal.     Palpations: Abdomen is soft. There is no hepatosplenomegaly or mass.     Tenderness: There is no abdominal  tenderness. There is no CVA tenderness, guarding or rebound.  Musculoskeletal:        General: No tenderness or edema. Normal range of motion.     Cervical back: Normal range of motion and neck supple.  Lymphadenopathy:     Cervical: No cervical adenopathy.  Skin:    General: Skin is warm.     Capillary Refill: Capillary refill takes less than 2 seconds.     Findings: No rash.  Neurological:     Mental Status: He is alert and oriented to person, place, and time.     Cranial Nerves: No cranial nerve deficit.     Deep Tendon Reflexes: Strength normal and reflexes are normal and symmetric.     Wt Readings from Last 3 Encounters:  05/27/20 169 lb (76.7 kg)  05/20/20 170 lb (77.1 kg)  05/08/20 170 lb (77.1 kg)    BP (!) 142/90   Pulse 84   Ht 5\' 8"  (1.727 m)   Wt 169 lb (76.7 kg)   BMI 25.70 kg/m   Assessment and Plan:  1. Essential hypertension Chronic.  Controlled.  Stable.  Still little elevated with a systolic of the 188 and diastolic of 90.  We will increase his losartan to 100 mg once a day and will recheck in 4 weeks. - losartan (COZAAR) 100 MG tablet; Take 1 tablet (100 mg total) by mouth daily.  Dispense: 90 tablet; Refill: 0  2. Adult hypothyroidism Chronic.  Controlled.  Stable.  Recently increased from 112 mcg to 125 mcg.  Will check TSH to see what current level of control is. - TSH  Additional that patient has picked up his NicoDerm CQ and is still contemplating the start of his quit smoking date.

## 2020-05-27 NOTE — Telephone Encounter (Signed)
C/o elevated B/P and feeling some lightheadedness and weakness. Patient reports he started on higher dosage of synthroid medication 125 mcg last Wednesday and noted B/P elevating. Denies headache , blurred vision, and other neurological changes or weakness in one side. No balance issues reported. Patient reports he has just "felt funny" but feels "relativley good" . B/P at beginning of call 146/95 pulse 99 and 5 minutes later at 1146 B/P 145/85 pulse 95. OV scheduled for today 05/27/20. Patient reports he would like clarification from "Tarra" , if this is normal with the increase in his thyroid medication. Please confirm OV is required. Care advise given. Patient verbalized understanding of care advise and to call back or go to [facility] or ED if symptoms worsen. Please advise concerning OV.    Reason for Disposition . [2] Systolic BP  >= 637 OR Diastolic >= 80 AND [8] taking BP medications  Answer Assessment - Initial Assessment Questions 1. BLOOD PRESSURE: "What is the blood pressure?" "Did you take at least two measurements 5 minutes apart?"     146/95 pulse 99 rechecked after 5 minutes at 1146 for 145/85 pulse 95 2. ONSET: "When did you take your blood pressure?"     While on call 3. HOW: "How did you obtain the blood pressure?" (e.g., visiting nurse, automatic home BP monitor)     Home B/P monitor 4. HISTORY: "Do you have a history of high blood pressure?"     Yes  5. MEDICATIONS: "Are you taking any medications for blood pressure?" "Have you missed any doses recently?"     Yes, losartan 25 mg  6. OTHER SYMPTOMS: "Do you have any symptoms?" (e.g., headache, chest pain, blurred vision, difficulty breathing, weakness)     Lightheadedness at times, weakness but feels "relatively good" 7. PREGNANCY: "Is there any chance you are pregnant?" "When was your last menstrual period?"     na  Protocols used: BLOOD PRESSURE - HIGH-A-AH

## 2020-05-28 LAB — TSH: TSH: 7.86 u[IU]/mL — ABNORMAL HIGH (ref 0.450–4.500)

## 2020-06-19 ENCOUNTER — Other Ambulatory Visit: Payer: Self-pay | Admitting: *Deleted

## 2020-06-19 DIAGNOSIS — F17200 Nicotine dependence, unspecified, uncomplicated: Secondary | ICD-10-CM

## 2020-06-19 DIAGNOSIS — Z87891 Personal history of nicotine dependence: Secondary | ICD-10-CM

## 2020-06-19 DIAGNOSIS — Z122 Encounter for screening for malignant neoplasm of respiratory organs: Secondary | ICD-10-CM

## 2020-06-19 NOTE — Progress Notes (Signed)
Contacted and scheduled for annual lung screening scan. Patient is a current smoker with a 78 pack year history.

## 2020-06-25 ENCOUNTER — Other Ambulatory Visit: Payer: Self-pay

## 2020-06-25 ENCOUNTER — Encounter: Payer: Self-pay | Admitting: Family Medicine

## 2020-06-25 ENCOUNTER — Ambulatory Visit: Payer: Medicare PPO | Admitting: Family Medicine

## 2020-06-25 VITALS — BP 130/80 | HR 64 | Ht 68.0 in | Wt 170.0 lb

## 2020-06-25 DIAGNOSIS — E039 Hypothyroidism, unspecified: Secondary | ICD-10-CM

## 2020-06-25 DIAGNOSIS — I10 Essential (primary) hypertension: Secondary | ICD-10-CM

## 2020-06-25 MED ORDER — LOSARTAN POTASSIUM 100 MG PO TABS: 100.0000 mg | ORAL_TABLET | Freq: Every day | ORAL | 1 refills | Status: DC

## 2020-06-25 NOTE — Progress Notes (Signed)
Date:  06/25/2020   Name:  Joel Flores   DOB:  04/06/1951   MRN:  [id number]   Chief Complaint: Hypertension (Follow up)  Hypertension This is a chronic problem. The current episode started more than 1 year ago. The problem has been gradually improving since onset. The problem is controlled. Associated symptoms include shortness of breath and sweats. Pertinent negatives include no anxiety, blurred vision, chest pain, headaches, malaise/fatigue, neck pain, orthopnea, palpitations, peripheral edema or PND. There are no associated agents to hypertension. There are no known risk factors for coronary artery disease. Past treatments include angiotensin blockers. The current treatment provides moderate improvement. There are no compliance problems.  There is no history of angina, kidney disease, CAD/MI, CVA, heart failure, left ventricular hypertrophy, PVD or retinopathy. There is no history of chronic renal disease, a hypertension causing med or renovascular disease.    Lab Results  Component Value Date   CREATININE 0.77 05/20/2020   BUN 11 05/20/2020   NA 140 05/20/2020   K 3.9 05/20/2020   CL 101 05/20/2020   CO2 23 05/20/2020   Lab Results  Component Value Date   CHOL 145 05/20/2020   HDL 37 (L) 05/20/2020   LDLCALC 81 05/20/2020   TRIG 156 (H) 05/20/2020   CHOLHDL 3.7 06/17/2018   Lab Results  Component Value Date   TSH 7.860 (H) 05/27/2020   No results found for: HGBA1C Lab Results  Component Value Date   WBC 8.5 11/11/2018   HGB 15.1 11/11/2018   HCT 44.1 11/11/2018   MCV 86 11/11/2018   PLT 322 11/11/2018   Lab Results  Component Value Date   ALT 19 05/20/2020   AST 24 05/20/2020   ALKPHOS 98 05/20/2020   BILITOT 0.4 05/20/2020     Review of Systems  Constitutional: Negative for chills, fever and malaise/fatigue.  HENT: Negative for drooling, ear discharge, ear pain and sore throat.   Eyes: Negative for blurred vision.  Respiratory: Positive for shortness of  breath. Negative for cough and wheezing.   Cardiovascular: Negative for chest pain, palpitations, orthopnea, leg swelling and PND.  Gastrointestinal: Negative for abdominal pain, blood in stool, constipation, diarrhea and nausea.  Endocrine: Negative for polydipsia.  Genitourinary: Negative for dysuria, frequency, hematuria and urgency.  Musculoskeletal: Negative for back pain, myalgias and neck pain.  Skin: Negative for rash.  Allergic/Immunologic: Negative for environmental allergies.  Neurological: Negative for dizziness and headaches.  Hematological: Does not bruise/bleed easily.  Psychiatric/Behavioral: Negative for suicidal ideas. The patient is not nervous/anxious.     Patient Active Problem List   Diagnosis Date Noted  . Colon cancer screening   . Polyp of sigmoid colon   . SOBOE (shortness of breath on exertion) 07/30/2017  . PVD (peripheral vascular disease) ([place]) 11/17/2016  . Tobacco use disorder 11/17/2016  . Reactive airway disease, mild intermittent, uncomplicated 94/17/4081  . Allergic rhinitis due to pollen 08/13/2015  . Nocturia 08/13/2015  . Atherosclerosis of aorta ([place]) 02/12/2015  . Essential hypertension 02/12/2015  . Esophageal reflux 02/12/2015  . Depression 02/12/2015  . Adult hypothyroidism 02/12/2015  . Chronic anxiety 02/12/2015  . Hyperlipemia 02/12/2015    Allergies  Allergen Reactions  . Sulfa Antibiotics Rash    Past Surgical History:  Procedure Laterality Date  . CATARACT EXTRACTION W/PHACO Right 03/16/2018   Procedure: CATARACT EXTRACTION PHACO AND INTRAOCULAR LENS PLACEMENT (IOC);  Surgeon: Marchia Meiers, MD;  Location: [facility];  Service: Ophthalmology;  Laterality: Right;  [place]  01:05 CDE 11.85 Fluid pack lot # [id number]  . CATARACT EXTRACTION W/PHACO Left 05/08/2020   Procedure: CATARACT EXTRACTION PHACO AND INTRAOCULAR LENS PLACEMENT (IOC) LEFT 7.32 00:54.7 13.4%;  Surgeon: Leandrew Koyanagi, MD;  Location: [place];   Service: Ophthalmology;  Laterality: Left;  . CHOLECYSTECTOMY    . COLONOSCOPY  2015   Dr Allen Norris- cleared for 5 years   . COLONOSCOPY WITH PROPOFOL N/A 01/17/2018   Procedure: COLONOSCOPY WITH PROPOFOL;  Surgeon: Lucilla Lame, MD;  Location: [place];  Service: Endoscopy;  Laterality: N/A;  requests early  . INSERTION OF MESH N/A 08/31/2017   Procedure: INSERTION OF MESH;  Surgeon: Vickie Epley, MD;  Location: [facility];  Service: General;  Laterality: N/A;  . POLYPECTOMY  01/17/2018   Procedure: POLYPECTOMY;  Surgeon: Lucilla Lame, MD;  Location: [place];  Service: Endoscopy;;  . UMBILICAL HERNIA REPAIR N/A 08/31/2017   Procedure: HERNIA REPAIR UMBILICAL ADULT;  Surgeon: Vickie Epley, MD;  Location: [facility];  Service: General;  Laterality: N/A;  . VEIN SURGERY     2 stents in R) leg    Social History   Tobacco Use  . Smoking status: Current Every Day Smoker    Packs/day: 1.50    Years: 50.00    Pack years: 75.00  . Smokeless tobacco: Never Used  . Tobacco comment: pt wants to try patches again but states they are too costly  Vaping Use  . Vaping Use: Never used  Substance Use Topics  . Alcohol use: Yes    Alcohol/week: 1.0 standard drink    Types: 1 Cans of beer per week  . Drug use: No     Medication list has been reviewed and updated.  Current Meds  Medication Sig  . acetaminophen (TYLENOL) 325 MG tablet Take 325 mg by mouth daily as needed for moderate pain or headache.  . albuterol (VENTOLIN HFA) 108 (90 Base) MCG/ACT inhaler Inhale 2 puffs into the lungs every 6 (six) hours as needed for wheezing or shortness of breath.  . cetirizine (ZYRTEC) 10 MG tablet Take 1 tablet (10 mg total) by mouth daily.  . clonazePAM (KLONOPIN) 0.5 MG tablet Take 0.5 tablets (0.25 mg total) by mouth daily. Take one half daily  . clopidogrel (PLAVIX) 75 MG tablet Take 1 tablet (75 mg total) by mouth daily.  Marland Kitchen dexlansoprazole (DEXILANT) 60 MG capsule Take 1 capsule  (60 mg total) by mouth daily.  . fluticasone (FLONASE) 50 MCG/ACT nasal spray SPRAY 2 SPRAYS INTO EACH NOSTRIL EVERY DAY  . ibuprofen (ADVIL,MOTRIN) 200 MG tablet Take 400 mg by mouth every 8 (eight) hours as needed (for pain).  Marland Kitchen levothyroxine (SYNTHROID) 125 MCG tablet Take 1 tablet (125 mcg total) by mouth daily.  Marland Kitchen losartan (COZAAR) 100 MG tablet Take 1 tablet (100 mg total) by mouth daily.  . rosuvastatin (CRESTOR) 20 MG tablet Take 1 tablet (20 mg total) by mouth daily.  . sertraline (ZOLOFT) 100 MG tablet Take 1 tablet (100 mg total) by mouth daily.    PHQ 2/9 Scores 05/20/2020 11/16/2019 07/24/2019 06/26/2019  PHQ - 2 Score 0 0 0 0  PHQ- 9 Score 0 0 - 0    GAD 7 : Generalized Anxiety Score 05/20/2020 11/16/2019 06/26/2019 05/15/2019  Nervous, Anxious, on Edge 0 3 0 0  Control/stop worrying 0 0 0 0  Worry too much - different things 0 0 0 0  Trouble relaxing 0 0 0 0  Restless 0 0 0  0  Easily annoyed or irritable 0 0 0 2  Afraid - awful might happen 0 1 0 1  Total GAD 7 Score 0 4 0 3  Anxiety Difficulty - Not difficult at all - Not difficult at all    BP Readings from Last 3 Encounters:  06/25/20 130/80  05/27/20 (!) 142/90  05/20/20 140/90    Physical Exam Vitals and nursing note reviewed.  HENT:     Head: Normocephalic.     Right Ear: External ear normal.     Left Ear: Tympanic membrane and external ear normal.     Nose: Nose normal. No congestion or rhinorrhea.     Mouth/Throat:     Mouth: Oropharynx is clear and moist.  Eyes:     General: No scleral icterus.       Right eye: No discharge.        Left eye: No discharge.     Extraocular Movements: EOM normal.     Conjunctiva/sclera: Conjunctivae normal.     Pupils: Pupils are equal, round, and reactive to light.  Neck:     Thyroid: No thyroid mass, thyromegaly or thyroid tenderness.     Vascular: Normal carotid pulses. No carotid bruit, hepatojugular reflux or JVD.     Trachea: No tracheal deviation.  Cardiovascular:      Rate and Rhythm: Normal rate and regular rhythm.     Pulses: Intact distal pulses.     Heart sounds: Normal heart sounds, S1 normal and S2 normal. No murmur heard.  No systolic murmur is present.  No diastolic murmur is present. No friction rub. No gallop. No S3 or S4 sounds.   Pulmonary:     Effort: No respiratory distress.     Breath sounds: Normal breath sounds. No decreased breath sounds, wheezing, rhonchi or rales.  Chest:  Breasts:     Right: No axillary adenopathy or supraclavicular adenopathy.     Left: No axillary adenopathy or supraclavicular adenopathy.    Abdominal:     General: Bowel sounds are normal.     Palpations: Abdomen is soft. There is no hepatomegaly, splenomegaly, hepatosplenomegaly or mass.     Tenderness: There is no abdominal tenderness. There is no CVA tenderness, guarding or rebound.  Musculoskeletal:        General: No tenderness or edema. Normal range of motion.     Cervical back: Normal range of motion and neck supple.  Lymphadenopathy:     Cervical: No cervical adenopathy.     Right cervical: No superficial, deep or posterior cervical adenopathy.    Left cervical: No superficial, deep or posterior cervical adenopathy.     Upper Body:     Right upper body: No supraclavicular, axillary, pectoral or epitrochlear adenopathy.     Left upper body: No supraclavicular, axillary, pectoral or epitrochlear adenopathy.     Lower Body: No right inguinal adenopathy. No left inguinal adenopathy.  Skin:    General: Skin is warm.     Findings: No rash.  Neurological:     Mental Status: He is alert and oriented to person, place, and time.     Cranial Nerves: No cranial nerve deficit.     Deep Tendon Reflexes: Strength normal and reflexes are normal and symmetric.     Wt Readings from Last 3 Encounters:  06/25/20 170 lb (77.1 kg)  05/27/20 169 lb (76.7 kg)  05/20/20 170 lb (77.1 kg)    BP 130/80   Pulse 64   Ht 5\' 8"  (  1.727 m)   Wt 170 lb (77.1 kg)    BMI 25.85 kg/m   Assessment and Plan:  1. Essential hypertension Chronic.  Controlled.  Stable.  Blood pressure today is 130/80.  We will continue on losartan 100 mg once a day.  Will recheck in 6 months. - losartan (COZAAR) 100 MG tablet; Take 1 tablet (100 mg total) by mouth daily.  Dispense: 90 tablet; Refill: 1  2. Adult hypothyroidism Patient will return to get a TSH with thyroid panel and we will adjust accordingly his levothyroxine.

## 2020-07-01 ENCOUNTER — Other Ambulatory Visit: Payer: Self-pay

## 2020-07-01 ENCOUNTER — Ambulatory Visit
Admission: RE | Admit: 2020-07-01 | Discharge: 2020-07-01 | Disposition: A | Discharge status: Home / Self Care | Payer: Medicare PPO | Source: Ambulatory Visit | Attending: Oncology | Admitting: Oncology

## 2020-07-01 DIAGNOSIS — F17210 Nicotine dependence, cigarettes, uncomplicated: Secondary | ICD-10-CM | POA: Diagnosis not present

## 2020-07-01 DIAGNOSIS — F17200 Nicotine dependence, unspecified, uncomplicated: Secondary | ICD-10-CM | POA: Insufficient documentation

## 2020-07-01 DIAGNOSIS — Z122 Encounter for screening for malignant neoplasm of respiratory organs: Secondary | ICD-10-CM | POA: Diagnosis not present

## 2020-07-01 DIAGNOSIS — Z87891 Personal history of nicotine dependence: Secondary | ICD-10-CM | POA: Insufficient documentation

## 2020-07-08 ENCOUNTER — Other Ambulatory Visit: Payer: Self-pay | Admitting: Family Medicine

## 2020-07-08 DIAGNOSIS — E039 Hypothyroidism, unspecified: Secondary | ICD-10-CM

## 2020-07-08 NOTE — Telephone Encounter (Signed)
Requested Prescriptions  Pending Prescriptions Disp Refills  . levothyroxine (SYNTHROID) 125 MCG tablet [Pharmacy Med Name: LEVOTHYROXINE 125 MCG TABLET] 30 tablet 1    Sig: TAKE 1 TABLET BY MOUTH EVERY DAY     Endocrinology:  Hypothyroid Agents Failed - 07/08/2020 12:31 PM      Failed - TSH needs to be rechecked within 3 months after an abnormal result. Refill until TSH is due.      Failed - TSH in normal range and within 360 days    TSH  Date Value Ref Range Status  05/27/2020 7.860 (H) 0.450 - 4.500 uIU/mL Final         Passed - Valid encounter within last 12 months    Recent Outpatient Visits          1 week ago Essential hypertension   [place], Deanna C, MD   1 month ago Essential hypertension   [place], Deanna C, MD   1 month ago Essential hypertension   [facility] Juline Patch, MD   7 months ago Acute bronchitis with chronic obstructive pulmonary disease (COPD) ([place])   [facility] Juline Patch, MD   1 year ago Memory changes   [place], Deanna C, MD      Future Appointments            In 4 months Juline Patch, MD [facility], [facility]

## 2020-07-10 ENCOUNTER — Encounter: Payer: Self-pay | Admitting: *Deleted

## 2020-07-11 ENCOUNTER — Other Ambulatory Visit: Payer: Medicare PPO

## 2020-07-11 DIAGNOSIS — E039 Hypothyroidism, unspecified: Secondary | ICD-10-CM | POA: Diagnosis not present

## 2020-07-12 LAB — THYROID PANEL WITH TSH
Free Thyroxine Index: 2.5 (ref 1.2–4.9)
T3 Uptake Ratio: 29 % (ref 24–39)
T4, Total: 8.7 ug/dL (ref 4.5–12.0)
TSH: 3.15 u[IU]/mL (ref 0.450–4.500)

## 2020-07-22 ENCOUNTER — Other Ambulatory Visit (INDEPENDENT_AMBULATORY_CARE_PROVIDER_SITE_OTHER): Payer: Self-pay | Admitting: Vascular Surgery

## 2020-07-22 DIAGNOSIS — I739 Peripheral vascular disease, unspecified: Secondary | ICD-10-CM

## 2020-07-22 DIAGNOSIS — I779 Disorder of arteries and arterioles, unspecified: Secondary | ICD-10-CM

## 2020-07-23 ENCOUNTER — Ambulatory Visit (INDEPENDENT_AMBULATORY_CARE_PROVIDER_SITE_OTHER): Payer: Medicare PPO

## 2020-07-23 ENCOUNTER — Encounter (INDEPENDENT_AMBULATORY_CARE_PROVIDER_SITE_OTHER): Payer: Self-pay | Admitting: Nurse Practitioner

## 2020-07-23 ENCOUNTER — Ambulatory Visit (INDEPENDENT_AMBULATORY_CARE_PROVIDER_SITE_OTHER): Payer: Medicare Other | Admitting: Nurse Practitioner

## 2020-07-23 ENCOUNTER — Other Ambulatory Visit: Payer: Self-pay

## 2020-07-23 VITALS — BP 157/91 | HR 76 | Ht 68.0 in | Wt 171.0 lb

## 2020-07-23 DIAGNOSIS — I10 Essential (primary) hypertension: Secondary | ICD-10-CM

## 2020-07-23 DIAGNOSIS — I779 Disorder of arteries and arterioles, unspecified: Secondary | ICD-10-CM

## 2020-07-23 DIAGNOSIS — I6523 Occlusion and stenosis of bilateral carotid arteries: Secondary | ICD-10-CM | POA: Diagnosis not present

## 2020-07-23 DIAGNOSIS — I739 Peripheral vascular disease, unspecified: Secondary | ICD-10-CM | POA: Diagnosis not present

## 2020-07-23 DIAGNOSIS — E785 Hyperlipidemia, unspecified: Secondary | ICD-10-CM

## 2020-07-28 ENCOUNTER — Encounter (INDEPENDENT_AMBULATORY_CARE_PROVIDER_SITE_OTHER): Payer: Self-pay | Admitting: Nurse Practitioner

## 2020-07-28 NOTE — Progress Notes (Signed)
Subjective:    Patient ID: Joel Flores, male    DOB: Mar 01, 1951, 70 y.o.   MRN: [id number] Chief Complaint  Patient presents with  . Follow-up  . Carotid    U/S    Joel Flores is a 70 year old male that returns today for evaluation of his PAD and carotid artery stenosis.  It has been over a decade since his aortic iliac repair.  The patient has minimal claudication with no development of rest pain or ulcerations.  He has also been several years since we have checked his carotid arteries.  In that timeframe the patient has not had any TIA or CVA-like symptoms.  He denies any amaurosis fugax.  He denies any great changes.  Today the patient has an ABI 0.94 on the right and 0.98 on the left.  The patient has triphasic tibial artery waveforms bilaterally with good toe waveforms bilaterally.  The patient's carotid artery duplex has less than 40% stenosis of the ICAs bilaterally.  Normal flow hemodynamics in the bilateral subclavian arteries.  Bilateral vertebrals have antegrade flow.   Review of Systems  Eyes: Negative for visual disturbance.  Musculoskeletal: Negative for gait problem.  All other systems reviewed and are negative.      Objective:   Physical Exam Vitals reviewed.  HENT:     Head: Normocephalic.  Cardiovascular:     Rate and Rhythm: Normal rate.     Pulses: Normal pulses.  Pulmonary:     Effort: Pulmonary effort is normal.  Neurological:     Mental Status: He is alert and oriented to person, place, and time.  Psychiatric:        Mood and Affect: Mood normal.        Behavior: Behavior normal.        Thought Content: Thought content normal.        Judgment: Judgment normal.     BP (!) 157/91   Pulse 76   Ht 5\' 8"  (1.727 m)   Wt 171 lb (77.6 kg)   BMI 26.00 kg/m   Past Medical History:  Diagnosis Date  . Anxiety    PANIC ATTACKS  . Arthritis    right shoulder  . Atherosclerosis of aorta ([place]) 02/12/2015  . COPD (chronic obstructive pulmonary disease)  ([place])   . Cough   . Depression   . Environmental and seasonal allergies   . Essential hypertension 02/12/2015  . Family history of adverse reaction to anesthesia    Father - 73 - MI, then stroke after Prostate surgery  . GERD (gastroesophageal reflux disease)   . History of hiatal hernia   . Hyperlipidemia   . Hypertension   . Hypothyroidism   . Incisional hernia, without obstruction or gangrene   . PVD (peripheral vascular disease) ([place]) 11/17/2016  . Thyroid disease   . Venous insufficiency   . Vertigo    can happen daily  . Wears hearing aid in both ears    has, does not wear    Social History   Socioeconomic History  . Marital status: Married    Spouse name: Not on file  . Number of children: 3  . Years of education: Not on file  . Highest education level: 12th grade  Occupational History  . Occupation: retired  Tobacco Use  . Smoking status: Current Every Day Smoker    Packs/day: 1.50    Years: 50.00    Pack years: 75.00  . Smokeless tobacco: Never Used  . Tobacco comment:  pt wants to try patches again but states they are too costly  Vaping Use  . Vaping Use: Never used  Substance and Sexual Activity  . Alcohol use: Yes    Alcohol/week: 1.0 standard drink    Types: 1 Cans of beer per week  . Drug use: No  . Sexual activity: Not Currently  Other Topics Concern  . Not on file  Social History Narrative  . Not on file   Social Determinants of Health   Financial Resource Strain: Not on file  Food Insecurity: Not on file  Transportation Needs: Not on file  Physical Activity: Not on file  Stress: Not on file  Social Connections: Not on file  Intimate Partner Violence: Not on file    Past Surgical History:  Procedure Laterality Date  . CATARACT EXTRACTION W/PHACO Right 03/16/2018   Procedure: CATARACT EXTRACTION PHACO AND INTRAOCULAR LENS PLACEMENT (IOC);  Surgeon: Marchia Meiers, MD;  Location: [facility];  Service: Ophthalmology;  Laterality: Right;  [place]   01:05 CDE 11.85 Fluid pack lot # [id number]  . CATARACT EXTRACTION W/PHACO Left 05/08/2020   Procedure: CATARACT EXTRACTION PHACO AND INTRAOCULAR LENS PLACEMENT (IOC) LEFT 7.32 00:54.7 13.4%;  Surgeon: Leandrew Koyanagi, MD;  Location: [place];  Service: Ophthalmology;  Laterality: Left;  . CHOLECYSTECTOMY    . COLONOSCOPY  2015   Dr Allen Norris- cleared for 5 years   . COLONOSCOPY WITH PROPOFOL N/A 01/17/2018   Procedure: COLONOSCOPY WITH PROPOFOL;  Surgeon: Lucilla Lame, MD;  Location: [place];  Service: Endoscopy;  Laterality: N/A;  requests early  . INSERTION OF MESH N/A 08/31/2017   Procedure: INSERTION OF MESH;  Surgeon: Vickie Epley, MD;  Location: [facility];  Service: General;  Laterality: N/A;  . POLYPECTOMY  01/17/2018   Procedure: POLYPECTOMY;  Surgeon: Lucilla Lame, MD;  Location: [place];  Service: Endoscopy;;  . UMBILICAL HERNIA REPAIR N/A 08/31/2017   Procedure: HERNIA REPAIR UMBILICAL ADULT;  Surgeon: Vickie Epley, MD;  Location: [facility];  Service: General;  Laterality: N/A;  . VEIN SURGERY     2 stents in R) leg    Family History  Problem Relation Age of Onset  . Diabetes Mother   . Cancer Father   . Heart disease Father   . Stroke Father     Allergies  Allergen Reactions  . Sulfa Antibiotics Rash    CBC Latest Ref Rng & Units 11/11/2018 07/27/2017 11/02/2016  WBC 3.4 - 10.8 x10E3/uL 8.5 8.3 14.0(H)  Hemoglobin 13.0 - 17.7 g/dL 15.1 13.7 13.3  Hematocrit 37.5 - 51.0 % 44.1 41.1 38.6(L)  Platelets 150 - 450 x10E3/uL 322 413(H) 326      CMP     Component Value Date/Time   NA 140 05/20/2020 1520   K 3.9 05/20/2020 1520   CL 101 05/20/2020 1520   CO2 23 05/20/2020 1520   GLUCOSE 107 (H) 05/20/2020 1520   GLUCOSE 130 (H) 10/30/2016 0904   BUN 11 05/20/2020 1520   CREATININE 0.77 05/20/2020 1520   CALCIUM 9.6 05/20/2020 1520   PROT 7.5 05/20/2020 1520   ALBUMIN 4.5 05/20/2020 1520   AST 24 05/20/2020 1520   ALT 19  05/20/2020 1520   ALKPHOS 98 05/20/2020 1520   BILITOT 0.4 05/20/2020 1520   GFRNONAA 92 05/20/2020 1520   GFRAA 106 05/20/2020 1520     VAS [place] ABI WITH/WO TBI  Result Date: 07/26/2020 LOWER EXTREMITY DOPPLER STUDY  Vascular Interventions: Rt PTA of CIA and  stent. Comparison Study: 11/19/2017 Performing Technologist: Charlane Ferretti RT (R)(VS)  Examination Guidelines: A complete evaluation includes at minimum, Doppler waveform signals and systolic blood pressure reading at the level of bilateral brachial, anterior tibial, and posterior tibial arteries, when vessel segments are accessible. Bilateral testing is considered an integral part of a complete examination. Photoelectric Plethysmograph (PPG) waveforms and toe systolic pressure readings are included as required and additional duplex testing as needed. Limited examinations for reoccurring indications may be performed as noted.  ABI Findings: +---------+------------------+-----+---------+--------+ Right    Rt Pressure (mmHg)IndexWaveform Comment  +---------+------------------+-----+---------+--------+ Brachial 159                                      +---------+------------------+-----+---------+--------+ ATA      152               0.88 triphasic         +---------+------------------+-----+---------+--------+ PTA      162               0.94 triphasic         +---------+------------------+-----+---------+--------+ Great Toe143               0.83 Normal            +---------+------------------+-----+---------+--------+ +---------+------------------+-----+---------+-------+ Left     Lt Pressure (mmHg)IndexWaveform Comment +---------+------------------+-----+---------+-------+ Brachial 172                                     +---------+------------------+-----+---------+-------+ ATA      163               0.95 triphasic        +---------+------------------+-----+---------+-------+ PTA      169                0.98 triphasic        +---------+------------------+-----+---------+-------+ Great Toe125               0.73 Normal           +---------+------------------+-----+---------+-------+ +-------+-----------+-----------+------------+------------+ ABI/TBIToday's ABIToday's TBIPrevious ABIPrevious TBI +-------+-----------+-----------+------------+------------+ Right  .94        .83        1.17        .63          +-------+-----------+-----------+------------+------------+ Left   .98        .73        1.19        .85          +-------+-----------+-----------+------------+------------+ Right ABIs appear decreased compared to prior study on 11/19/2017. Left ABIs appear essentially unchanged compared to prior study on 11/19/2017.  Summary: Right: Resting right ankle-brachial index indicates mild right lower extremity arterial disease. The right toe-brachial index is normal. Right TBI appears increased as compared to the previous exam on 11/19/2017. Left: Resting left ankle-brachial index is within normal range. No evidence of significant left lower extremity arterial disease. The left toe-brachial index is normal. Left TBI appears essentially unchnaged as compared to the previous exam on 11/19/2017.  *See table(s) above for measurements and observations.  Electronically signed by Leotis Pain MD on 07/26/2020 at 12:09:32 PM.   Final        Assessment & Plan:   1. Bilateral carotid artery stenosis Recommend:  Given the patient's asymptomatic subcritical stenosis no further invasive testing or surgery  at this time.  Duplex ultrasound shows less than 40% stenosis bilaterally.  Continue antiplatelet therapy as prescribed Continue management of CAD, HTN and Hyperlipidemia Healthy heart diet,  encouraged exercise at least 4 times per week Follow up in 12 months with duplex ultrasound and physical exam   2. Essential hypertension Continue antihypertensive medications as already ordered, these  medications have been reviewed and there are no changes at this time.   3. PVD (peripheral vascular disease) ([place])  Recommend:  The patient has evidence of atherosclerosis of the lower extremities with claudication.  The patient does not voice lifestyle limiting changes at this point in time.  Noninvasive studies do not suggest clinically significant change.  No invasive studies, angiography or surgery at this time The patient should continue walking and begin a more formal exercise program.  The patient should continue antiplatelet therapy and aggressive treatment of the lipid abnormalities  No changes in the patient's medications at this time  The patient should continue wearing graduated compression socks 10-15 mmHg strength to control the mild edema.   Patient will follow up in 1 year with noninvasive studies unless there are changes. 4. Hyperlipidemia, unspecified hyperlipidemia type Continue statin as ordered and reviewed, no changes at this time    Current Outpatient Medications on File Prior to Visit  Medication Sig Dispense Refill  . acetaminophen (TYLENOL) 325 MG tablet Take 325 mg by mouth daily as needed for moderate pain or headache.    . clonazePAM (KLONOPIN) 0.5 MG tablet Take 0.5 tablets (0.25 mg total) by mouth daily. Take one half daily 15 tablet 5  . clopidogrel (PLAVIX) 75 MG tablet Take 1 tablet (75 mg total) by mouth daily. 90 tablet 1  . dexlansoprazole (DEXILANT) 60 MG capsule Take 1 capsule (60 mg total) by mouth daily. 90 capsule 1  . fluticasone (FLONASE) 50 MCG/ACT nasal spray SPRAY 2 SPRAYS INTO EACH NOSTRIL EVERY DAY 48 mL 1  . ibuprofen (ADVIL,MOTRIN) 200 MG tablet Take 400 mg by mouth every 8 (eight) hours as needed (for pain).    Marland Kitchen levothyroxine (SYNTHROID) 125 MCG tablet TAKE 1 TABLET BY MOUTH EVERY DAY 30 tablet 1  . losartan (COZAAR) 100 MG tablet Take 100 mg by mouth daily.    . rosuvastatin (CRESTOR) 20 MG tablet Take 1 tablet (20 mg total) by  mouth daily. 90 tablet 1  . sertraline (ZOLOFT) 100 MG tablet Take 1 tablet (100 mg total) by mouth daily. 90 tablet 1  . albuterol (VENTOLIN HFA) 108 (90 Base) MCG/ACT inhaler Inhale 2 puffs into the lungs every 6 (six) hours as needed for wheezing or shortness of breath. (Patient not taking: Reported on 07/23/2020) 8 g 11  . cetirizine (ZYRTEC) 10 MG tablet Take 1 tablet (10 mg total) by mouth daily. (Patient not taking: Reported on 07/23/2020) 90 tablet 1   No current facility-administered medications on file prior to visit.    There are no Patient Instructions on file for this visit. No follow-ups on file.   Kris Hartmann, NP

## 2020-07-31 ENCOUNTER — Ambulatory Visit (INDEPENDENT_AMBULATORY_CARE_PROVIDER_SITE_OTHER): Payer: Medicare PPO

## 2020-07-31 ENCOUNTER — Other Ambulatory Visit: Payer: Self-pay

## 2020-07-31 VITALS — BP 118/82 | HR 87 | Temp 97.8°F | Resp 16 | Ht 68.0 in | Wt 170.8 lb

## 2020-07-31 DIAGNOSIS — Z0000 Encounter for general adult medical examination without abnormal findings: Secondary | ICD-10-CM | POA: Diagnosis not present

## 2020-07-31 NOTE — Patient Instructions (Signed)
Joel Flores , Thank you for taking time to come for your Medicare Wellness Visit. I appreciate your ongoing commitment to your health goals. Please review the following plan we discussed and let me know if I can assist you in the future.   Screening recommendations/referrals: Colonoscopy: done 01/17/18. Repeat in 2024 Recommended yearly ophthalmology/optometry visit for glaucoma screening and checkup Recommended yearly dental visit for hygiene and checkup  Vaccinations: Influenza vaccine: done 05/20/20 Pneumococcal vaccine: done 12/20/17 Tdap vaccine: done 11/02/16 Shingles vaccine: Shingrix discussed. Please contact your pharmacy for coverage information.  Covid-19: done 05/26/19, 06/20/19 & 02/19/20  Advanced directives: Please bring a copy of your health care power of attorney and living will to the office at your convenience once you have completed that paperwork.   Conditions/risks identified: If you wish to quit smoking, help is available. For free tobacco cessation program offerings call the [facility] at [phone] or Live Well Line at (424)117-4542. You may also visit [email or web address] or email [email or web address] for more information on other programs.   Next appointment: Follow up in one year for your annual wellness visit.   Preventive Care 27 Years and Older, Male Preventive care refers to lifestyle choices and visits with your health care provider that can promote health and wellness. What does preventive care include?  A yearly physical exam. This is also called an annual well check.  Dental exams once or twice a year.  Routine eye exams. Ask your health care provider how often you should have your eyes checked.  Personal lifestyle choices, including:  Daily care of your teeth and gums.  Regular physical activity.  Eating a healthy diet.  Avoiding tobacco and drug use.  Limiting alcohol use.  Practicing safe sex.  Taking low doses of  aspirin every day.  Taking vitamin and mineral supplements as recommended by your health care provider. What happens during an annual well check? The services and screenings done by your health care provider during your annual well check will depend on your age, overall health, lifestyle risk factors, and family history of disease. Counseling  Your health care provider may ask you questions about your:  Alcohol use.  Tobacco use.  Drug use.  Emotional well-being.  Home and relationship well-being.  Sexual activity.  Eating habits.  History of falls.  Memory and ability to understand (cognition).  Work and work Statistician. Screening  You may have the following tests or measurements:  Height, weight, and BMI.  Blood pressure.  Lipid and cholesterol levels. These may be checked every 5 years, or more frequently if you are over 64 years old.  Skin check.  Lung cancer screening. You may have this screening every year starting at age 26 if you have a 30-pack-year history of smoking and currently smoke or have quit within the past 15 years.  Fecal occult blood test (FOBT) of the stool. You may have this test every year starting at age 30.  Flexible sigmoidoscopy or colonoscopy. You may have a sigmoidoscopy every 5 years or a colonoscopy every 10 years starting at age 16.  Prostate cancer screening. Recommendations will vary depending on your family history and other risks.  Hepatitis C blood test.  Hepatitis B blood test.  Sexually transmitted disease (STD) testing.  Diabetes screening. This is done by checking your blood sugar (glucose) after you have not eaten for a while (fasting). You may have this done every 1-3 years.  Abdominal aortic aneurysm (AAA) screening. You may need  this if you are a current or former smoker.  Osteoporosis. You may be screened starting at age 80 if you are at high risk. Talk with your health care provider about your test results,  treatment options, and if necessary, the need for more tests. Vaccines  Your health care provider may recommend certain vaccines, such as:  Influenza vaccine. This is recommended every year.  Tetanus, diphtheria, and acellular pertussis (Tdap, Td) vaccine. You may need a Td booster every 10 years.  Zoster vaccine. You may need this after age 63.  Pneumococcal 13-valent conjugate (PCV13) vaccine. One dose is recommended after age 44.  Pneumococcal polysaccharide (PPSV23) vaccine. One dose is recommended after age 58. Talk to your health care provider about which screenings and vaccines you need and how often you need them. This information is not intended to replace advice given to you by your health care provider. Make sure you discuss any questions you have with your health care provider. Document Released: 05/10/2015 Document Revised: 01/01/2016 Document Reviewed: 02/12/2015 Elsevier Interactive Patient Education  2017 [place] Prevention in the Home Falls can cause injuries. They can happen to people of all ages. There are many things you can do to make your home safe and to help prevent falls. What can I do on the outside of my home?  Regularly fix the edges of walkways and driveways and fix any cracks.  Remove anything that might make you trip as you walk through a door, such as a raised step or threshold.  Trim any bushes or trees on the path to your home.  Use bright outdoor lighting.  Clear any walking paths of anything that might make someone trip, such as rocks or tools.  Regularly check to see if handrails are loose or broken. Make sure that both sides of any steps have handrails.  Any raised decks and porches should have guardrails on the edges.  Have any leaves, snow, or ice cleared regularly.  Use sand or salt on walking paths during winter.  Clean up any spills in your garage right away. This includes oil or grease spills. What can I do in the  bathroom?  Use night lights.  Install grab bars by the toilet and in the tub and shower. Do not use towel bars as grab bars.  Use non-skid mats or decals in the tub or shower.  If you need to sit down in the shower, use a plastic, non-slip stool.  Keep the floor dry. Clean up any water that spills on the floor as soon as it happens.  Remove soap buildup in the tub or shower regularly.  Attach bath mats securely with double-sided non-slip rug tape.  Do not have throw rugs and other things on the floor that can make you trip. What can I do in the bedroom?  Use night lights.  Make sure that you have a light by your bed that is easy to reach.  Do not use any sheets or blankets that are too big for your bed. They should not hang down onto the floor.  Have a firm chair that has side arms. You can use this for support while you get dressed.  Do not have throw rugs and other things on the floor that can make you trip. What can I do in the kitchen?  Clean up any spills right away.  Avoid walking on wet floors.  Keep items that you use a lot in easy-to-reach places.  If you  need to reach something above you, use a strong step stool that has a grab bar.  Keep electrical cords out of the way.  Do not use floor polish or wax that makes floors slippery. If you must use wax, use non-skid floor wax.  Do not have throw rugs and other things on the floor that can make you trip. What can I do with my stairs?  Do not leave any items on the stairs.  Make sure that there are handrails on both sides of the stairs and use them. Fix handrails that are broken or loose. Make sure that handrails are as long as the stairways.  Check any carpeting to make sure that it is firmly attached to the stairs. Fix any carpet that is loose or worn.  Avoid having throw rugs at the top or bottom of the stairs. If you do have throw rugs, attach them to the floor with carpet tape.  Make sure that you have a  light switch at the top of the stairs and the bottom of the stairs. If you do not have them, ask someone to add them for you. What else can I do to help prevent falls?  Wear shoes that:  Do not have high heels.  Have rubber bottoms.  Are comfortable and fit you well.  Are closed at the toe. Do not wear sandals.  If you use a stepladder:  Make sure that it is fully opened. Do not climb a closed stepladder.  Make sure that both sides of the stepladder are locked into place.  Ask someone to hold it for you, if possible.  Clearly mark and make sure that you can see:  Any grab bars or handrails.  First and last steps.  Where the edge of each step is.  Use tools that help you move around (mobility aids) if they are needed. These include:  Canes.  Walkers.  Scooters.  Crutches.  Turn on the lights when you go into a dark area. Replace any light bulbs as soon as they burn out.  Set up your furniture so you have a clear path. Avoid moving your furniture around.  If any of your floors are uneven, fix them.  If there are any pets around you, be aware of where they are.  Review your medicines with your doctor. Some medicines can make you feel dizzy. This can increase your chance of falling. Ask your doctor what other things that you can do to help prevent falls. This information is not intended to replace advice given to you by your health care provider. Make sure you discuss any questions you have with your health care provider. Document Released: 02/07/2009 Document Revised: 09/19/2015 Document Reviewed: 05/18/2014 Elsevier Interactive Patient Education  2017 [employer].

## 2020-07-31 NOTE — Progress Notes (Signed)
Subjective:   Joel Flores is a 69 y.o. male who presents for Medicare Annual/Subsequent preventive examination.  Review of Systems     Cardiac Risk Factors include: advanced age (>78men, >36 women);smoking/ tobacco exposure;hypertension;dyslipidemia;male gender     Objective:    Today's Vitals   07/31/20 1132  BP: 118/82  Pulse: 87  Resp: 16  Temp: 97.8 F (36.6 C)  TempSrc: Oral  SpO2: 98%  Weight: 170 lb 12.8 oz (77.5 kg)  Height: 5\' 8"  (1.727 m)   Body mass index is 25.97 kg/m.  Advanced Directives 07/31/2020 05/08/2020 07/24/2019 06/01/2018 03/16/2018 01/17/2018 12/04/2017  Does Patient Have a Medical Advance Directive? No No No No No No No  Type of Advance Directive - - - - - - -  Does patient want to make changes to medical advance directive? - - - - - - -  Copy of [place] in Chart? - - - - - - -  Would patient like information on creating a medical advance directive? No - Patient declined No - Patient declined Yes (MAU/Ambulatory/Procedural Areas - Information given) Yes (MAU/Ambulatory/Procedural Areas - Information given) No - Patient declined Yes (MAU/Ambulatory/Procedural Areas - Information given) No - Patient declined    Current Medications (verified) Outpatient Encounter Medications as of 07/31/2020  Medication Sig  . acetaminophen (TYLENOL) 325 MG tablet Take 325 mg by mouth daily as needed for moderate pain or headache.  . cetirizine (ZYRTEC) 10 MG tablet Take 1 tablet (10 mg total) by mouth daily.  . clonazePAM (KLONOPIN) 0.5 MG tablet Take 0.5 tablets (0.25 mg total) by mouth daily. Take one half daily  . clopidogrel (PLAVIX) 75 MG tablet Take 1 tablet (75 mg total) by mouth daily.  Marland Kitchen dexlansoprazole (DEXILANT) 60 MG capsule Take 1 capsule (60 mg total) by mouth daily.  . fluticasone (FLONASE) 50 MCG/ACT nasal spray SPRAY 2 SPRAYS INTO EACH NOSTRIL EVERY DAY  . ibuprofen (ADVIL,MOTRIN) 200 MG tablet Take 400 mg by mouth every 8 (eight) hours  as needed (for pain).  Marland Kitchen levothyroxine (SYNTHROID) 125 MCG tablet TAKE 1 TABLET BY MOUTH EVERY DAY  . losartan (COZAAR) 100 MG tablet Take 100 mg by mouth daily.  . rosuvastatin (CRESTOR) 20 MG tablet Take 1 tablet (20 mg total) by mouth daily.  . sertraline (ZOLOFT) 100 MG tablet Take 1 tablet (100 mg total) by mouth daily.  Marland Kitchen albuterol (VENTOLIN HFA) 108 (90 Base) MCG/ACT inhaler Inhale 2 puffs into the lungs every 6 (six) hours as needed for wheezing or shortness of breath. (Patient not taking: No sig reported)   No facility-administered encounter medications on file as of 07/31/2020.    Allergies (verified) Sulfa antibiotics   History: Past Medical History:  Diagnosis Date  . Anxiety    PANIC ATTACKS  . Arthritis    right shoulder  . Atherosclerosis of aorta ([place]) 02/12/2015  . COPD (chronic obstructive pulmonary disease) ([place])   . Cough   . Depression   . Environmental and seasonal allergies   . Essential hypertension 02/12/2015  . Family history of adverse reaction to anesthesia    Father - 13 - MI, then stroke after Prostate surgery  . GERD (gastroesophageal reflux disease)   . History of hiatal hernia   . Hyperlipidemia   . Hypertension   . Hypothyroidism   . Incisional hernia, without obstruction or gangrene   . PVD (peripheral vascular disease) ([place]) 11/17/2016  . Thyroid disease   . Venous insufficiency   .  Vertigo    can happen daily  . Wears hearing aid in both ears    has, does not wear   Past Surgical History:  Procedure Laterality Date  . CATARACT EXTRACTION W/PHACO Right 03/16/2018   Procedure: CATARACT EXTRACTION PHACO AND INTRAOCULAR LENS PLACEMENT (IOC);  Surgeon: Marchia Meiers, MD;  Location: [facility];  Service: Ophthalmology;  Laterality: Right;  [place]  01:05 CDE 11.85 Fluid pack lot # [id number]  . CATARACT EXTRACTION W/PHACO Left 05/08/2020   Procedure: CATARACT EXTRACTION PHACO AND INTRAOCULAR LENS PLACEMENT (IOC) LEFT 7.32 00:54.7 13.4%;  Surgeon:  Leandrew Koyanagi, MD;  Location: [place];  Service: Ophthalmology;  Laterality: Left;  . CHOLECYSTECTOMY    . COLONOSCOPY  2015   Dr Allen Norris- cleared for 5 years   . COLONOSCOPY WITH PROPOFOL N/A 01/17/2018   Procedure: COLONOSCOPY WITH PROPOFOL;  Surgeon: Lucilla Lame, MD;  Location: [place];  Service: Endoscopy;  Laterality: N/A;  requests early  . INSERTION OF MESH N/A 08/31/2017   Procedure: INSERTION OF MESH;  Surgeon: Vickie Epley, MD;  Location: [facility];  Service: General;  Laterality: N/A;  . POLYPECTOMY  01/17/2018   Procedure: POLYPECTOMY;  Surgeon: Lucilla Lame, MD;  Location: [place];  Service: Endoscopy;;  . UMBILICAL HERNIA REPAIR N/A 08/31/2017   Procedure: HERNIA REPAIR UMBILICAL ADULT;  Surgeon: Vickie Epley, MD;  Location: [facility];  Service: General;  Laterality: N/A;  . VEIN SURGERY     2 stents in R) leg   Family History  Problem Relation Age of Onset  . Diabetes Mother   . Cancer Father   . Heart disease Father   . Stroke Father    Social History   Socioeconomic History  . Marital status: Married    Spouse name: Not on file  . Number of children: 3  . Years of education: Not on file  . Highest education level: 12th grade  Occupational History  . Occupation: retired  Tobacco Use  . Smoking status: Current Every Day Smoker    Packs/day: 1.50    Years: 50.00    Pack years: 75.00  . Smokeless tobacco: Never Used  . Tobacco comment: pt wants to try patches again but states they are too costly  Vaping Use  . Vaping Use: Never used  Substance and Sexual Activity  . Alcohol use: Yes    Alcohol/week: 1.0 standard drink    Types: 1 Cans of beer per week    Comment: once a month  . Drug use: No  . Sexual activity: Not Currently  Other Topics Concern  . Not on file  Social History Narrative  . Not on file   Social Determinants of Health   Financial Resource Strain: Low Risk   . Difficulty of Paying Living  Expenses: Not hard at all  Food Insecurity: No Food Insecurity  . Worried About Charity fundraiser in the Last Year: Never true  . Ran Out of Food in the Last Year: Never true  Transportation Needs: No Transportation Needs  . Lack of Transportation (Medical): No  . Lack of Transportation (Non-Medical): No  Physical Activity: Inactive  . Days of Exercise per Week: 0 days  . Minutes of Exercise per Session: 0 min  Stress: No Stress Concern Present  . Feeling of Stress : Not at all  Social Connections: Moderately Isolated  . Frequency of Communication with Friends and Family: Three times a week  . Frequency of Social Gatherings with  Friends and Family: Once a week  . Attends Religious Services: Never  . Active Member of Clubs or Organizations: No  . Attends Archivist Meetings: Never  . Marital Status: Married    Tobacco Counseling Ready to quit: Yes Counseling given: Yes Comment: pt wants to try patches again but states they are too costly   Clinical Intake:  Pre-visit preparation completed: Yes  Pain : No/denies pain     BMI - recorded: 25.97 Nutritional Status: BMI 25 -29 Overweight Nutritional Risks: None Diabetes: No  How often do you need to have someone help you when you read instructions, pamphlets, or other written materials from your doctor or pharmacy?: 1 - Never  Diabetic?  Interpreter Needed?: No  Information entered by :: Clemetine Marker LPN   Activities of Daily Living In your present state of health, do you have any difficulty performing the following activities: 07/31/2020 05/08/2020  Hearing? Y N  Comment wears hearing aids -  Vision? N N  Difficulty concentrating or making decisions? Y N  Walking or climbing stairs? Y N  Dressing or bathing? N N  Doing errands, shopping? N -  Preparing Food and eating ? N -  Using the Toilet? N -  In the past six months, have you accidently leaked urine? N -  Do you have problems with loss of bowel  control? N -  Managing your Medications? N -  Managing your Finances? N -  Housekeeping or managing your Housekeeping? N -  Some recent data might be hidden    Patient Care Team: Juline Patch, MD as PCP - General (Family Medicine)  Indicate any recent Medical Services you may have received from other than [facility] providers in the past year (date may be approximate).     Assessment:   This is a routine wellness examination for [employer].  Hearing/Vision screen  Hearing Screening   125Hz  250Hz  500Hz  1000Hz  2000Hz  3000Hz  4000Hz  6000Hz  8000Hz   Right ear:           Left ear:           Comments: Hearing aids maintained by [facility]  Vision Screening Comments: Annual vision screenings done [facility]  Dietary issues and exercise activities discussed: Current Exercise Habits: The patient does not participate in regular exercise at present, Exercise limited by: orthopedic condition(s)  Goals    . DIET - REDUCE SODIUM INTAKE     Reduce sodium intake by avoiding adding salt to food.     . Quit Smoking     Pt would like quit smoking over the next year. Plans to start by using the patch to quit.   If you wish to quit smoking, help is available. For free tobacco cessation program offerings call the [facility] at [phone] or Live Well Line at (813)492-0270. You may also visit [email or web address] or email [email or web address] for more information on other programs.        Depression Screen PHQ 2/9 Scores 07/31/2020 05/20/2020 11/16/2019 07/24/2019 06/26/2019 05/15/2019 11/11/2018  PHQ - 2 Score 0 0 0 0 0 0 0  PHQ- 9 Score - 0 0 - 0 4 0    Fall Risk Fall Risk  07/31/2020 11/16/2019 07/24/2019 06/26/2019 05/15/2019  Falls in the past year? 0 0 0 0 0  Number falls in past yr: 0 - 0 - -  Injury with Fall? 0 - 0 - -  Risk for fall due to : No Fall Risks -  No Fall Risks - -  Follow up Falls prevention discussed Falls evaluation completed Falls prevention discussed  Falls evaluation completed Falls evaluation completed    FALL RISK PREVENTION PERTAINING TO THE HOME:  Any stairs in or around the home? Yes If so, are there any without handrails? No  Home free of loose throw rugs in walkways, pet beds, electrical cords, etc? Yes  Adequate lighting in your home to reduce risk of falls? Yes   ASSISTIVE DEVICES UTILIZED TO PREVENT FALLS:  Life alert? No  Use of a cane, walker or w/c? No  Grab bars in the bathroom? No  Shower chair or bench in shower? Yes  Elevated toilet seat or a handicapped toilet? Yes   TIMED UP AND GO:  Was the test performed? Yes .  Length of time to ambulate 10 feet: 5 sec.   Gait steady and fast without use of assistive device  Cognitive Function:     6CIT Screen 07/31/2020 07/24/2019 06/01/2018  What Year? 0 points 0 points 0 points  What month? 0 points 0 points 0 points  What time? 0 points 0 points 0 points  Count back from 20 0 points 0 points 0 points  Months in reverse 4 points 2 points 2 points  Repeat phrase 0 points 0 points 4 points  Total Score 4 2 6     Immunizations Immunization History  Administered Date(s) Administered  . Fluad Quad(high Dose 65+) 01/18/2019, 05/20/2020  . Influenza, High Dose Seasonal PF 01/29/2017, 02/09/2018  . PFIZER(Purple Top)SARS-COV-2 Vaccination 05/26/2019, 06/20/2019, 02/19/2020  . Pneumococcal Conjugate-13 11/02/2016  . Pneumococcal Polysaccharide-23 12/20/2017  . Tdap 11/02/2016    TDAP status: Up to date  Flu Vaccine status: Up to date  Pneumococcal vaccine status: Up to date  Covid-19 vaccine status: Completed vaccines  Qualifies for Shingles Vaccine? Yes   Zostavax completed No   Shingrix Completed?: No.    Education has been provided regarding the importance of this vaccine. Patient has been advised to call insurance company to determine out of pocket expense if they have not yet received this vaccine. Advised may also receive vaccine at local pharmacy or  Health Dept. Verbalized acceptance and understanding.  Screening Tests Health Maintenance  Topic Date Due  . INFLUENZA VACCINE  11/25/2020  . COLONOSCOPY (Pts 45-25yrs Insurance coverage will need to be confirmed)  01/18/2023  . TETANUS/TDAP  11/03/2026  . COVID-19 Vaccine  Completed  . Hepatitis C Screening  Completed  . PNA vac Low Risk Adult  Completed  . HPV VACCINES  Aged Out    Health Maintenance  There are no preventive care reminders to display for this patient.  Colorectal cancer screening: Type of screening: Colonoscopy. Completed 01/17/18. Repeat every 5 years  Lung Cancer Screening: (Low Dose CT Chest recommended if Age 72-80 years, 30 pack-year currently smoking OR have quit w/in 15years.) does qualify. Completed 07/01/20.  Additional Screening:  Hepatitis C Screening: does qualify; Completed 07/27/17   Vision Screening: Recommended annual ophthalmology exams for early detection of glaucoma and other disorders of the eye. Is the patient up to date with their annual eye exam?  Yes  Who is the provider or what is the name of the office in which the patient attends annual eye exams? [place] Screening: Recommended annual dental exams for proper oral hygiene  Community Resource Referral / Chronic Care Management: CRR required this visit?  No   CCM required this visit?  No  Plan:     I have personally reviewed and noted the following in the patient's chart:   . Medical and social history . Use of alcohol, tobacco or illicit drugs  . Current medications and supplements . Functional ability and status . Nutritional status . Physical activity . Advanced directives . List of other physicians . Hospitalizations, surgeries, and ER visits in previous 12 months . Vitals . Screenings to include cognitive, depression, and falls . Referrals and appointments  In addition, I have reviewed and discussed with patient certain preventive protocols,  quality metrics, and best practice recommendations. A written personalized care plan for preventive services as well as general preventive health recommendations were provided to patient.     Clemetine Marker, LPN   07/27/6382   Nurse Notes: none

## 2020-08-23 ENCOUNTER — Encounter: Payer: Self-pay | Admitting: Family Medicine

## 2020-08-23 ENCOUNTER — Other Ambulatory Visit: Payer: Self-pay

## 2020-08-23 ENCOUNTER — Ambulatory Visit: Payer: Medicare PPO | Admitting: Family Medicine

## 2020-08-23 VITALS — BP 132/82 | HR 72 | Temp 97.6°F | Ht 68.0 in | Wt 171.0 lb

## 2020-08-23 DIAGNOSIS — J0100 Acute maxillary sinusitis, unspecified: Secondary | ICD-10-CM | POA: Diagnosis not present

## 2020-08-23 MED ORDER — AMOXICILLIN 500 MG PO CAPS: 500.0000 mg | ORAL_CAPSULE | Freq: Three times a day (TID) | ORAL | 0 refills | Status: AC

## 2020-08-23 NOTE — Progress Notes (Signed)
Date:  08/23/2020   Name:  Joel Flores   DOB:  11-May-1950   MRN:  [id number]   Chief Complaint: Cough (Green production- taking Mucinex x 2 days- helping some)  Cough This is a chronic problem. The current episode started more than 1 year ago. The problem has been waxing and waning. The problem occurs constantly. The cough is productive of purulent sputum. Pertinent negatives include no chest pain, chills, ear congestion, ear pain, fever, headaches, heartburn, hemoptysis, myalgias, nasal congestion, postnasal drip, rash, rhinorrhea, sore throat, shortness of breath, sweats, weight loss or wheezing. Nothing aggravates the symptoms. Risk factors for lung disease include smoking/tobacco exposure. Treatments tried: has inhaler on shelf. There is no history of environmental allergies.    Lab Results  Component Value Date   CREATININE 0.77 05/20/2020   BUN 11 05/20/2020   NA 140 05/20/2020   K 3.9 05/20/2020   CL 101 05/20/2020   CO2 23 05/20/2020   Lab Results  Component Value Date   CHOL 145 05/20/2020   HDL 37 (L) 05/20/2020   LDLCALC 81 05/20/2020   TRIG 156 (H) 05/20/2020   CHOLHDL 3.7 06/17/2018   Lab Results  Component Value Date   TSH 3.150 07/11/2020   No results found for: HGBA1C Lab Results  Component Value Date   WBC 8.5 11/11/2018   HGB 15.1 11/11/2018   HCT 44.1 11/11/2018   MCV 86 11/11/2018   PLT 322 11/11/2018   Lab Results  Component Value Date   ALT 19 05/20/2020   AST 24 05/20/2020   ALKPHOS 98 05/20/2020   BILITOT 0.4 05/20/2020     Review of Systems  Constitutional: Negative for chills, fever and weight loss.  HENT: Negative for drooling, ear discharge, ear pain, postnasal drip, rhinorrhea and sore throat.   Respiratory: Positive for cough. Negative for hemoptysis, shortness of breath and wheezing.   Cardiovascular: Negative for chest pain, palpitations and leg swelling.  Gastrointestinal: Negative for abdominal pain, blood in stool,  constipation, diarrhea, heartburn and nausea.  Endocrine: Negative for polydipsia.  Genitourinary: Negative for dysuria, frequency, hematuria and urgency.  Musculoskeletal: Negative for back pain, myalgias and neck pain.  Skin: Negative for rash.  Allergic/Immunologic: Negative for environmental allergies.  Neurological: Negative for dizziness and headaches.  Hematological: Does not bruise/bleed easily.  Psychiatric/Behavioral: Negative for suicidal ideas. The patient is not nervous/anxious.     Patient Active Problem List   Diagnosis Date Noted  . Bilateral carotid artery stenosis 09/20/2019  . Colon cancer screening   . Polyp of sigmoid colon   . SOBOE (shortness of breath on exertion) 07/30/2017  . PVD (peripheral vascular disease) ([place]) 11/17/2016  . Tobacco use disorder 11/17/2016  . Reactive airway disease, mild intermittent, uncomplicated 98/92/1194  . Allergic rhinitis due to pollen 08/13/2015  . Nocturia 08/13/2015  . Atherosclerosis of aorta ([place]) 02/12/2015  . Essential hypertension 02/12/2015  . Esophageal reflux 02/12/2015  . Depression 02/12/2015  . Adult hypothyroidism 02/12/2015  . Chronic anxiety 02/12/2015  . Hyperlipemia 02/12/2015    Allergies  Allergen Reactions  . Sulfa Antibiotics Rash    Past Surgical History:  Procedure Laterality Date  . CATARACT EXTRACTION W/PHACO Right 03/16/2018   Procedure: CATARACT EXTRACTION PHACO AND INTRAOCULAR LENS PLACEMENT (IOC);  Surgeon: Marchia Meiers, MD;  Location: [facility];  Service: Ophthalmology;  Laterality: Right;  [place]  01:05 CDE 11.85 Fluid pack lot # [id number]  . CATARACT EXTRACTION W/PHACO Left 05/08/2020   Procedure: CATARACT EXTRACTION  PHACO AND INTRAOCULAR LENS PLACEMENT (IOC) LEFT 7.32 00:54.7 13.4%;  Surgeon: Leandrew Koyanagi, MD;  Location: [place];  Service: Ophthalmology;  Laterality: Left;  . CHOLECYSTECTOMY    . COLONOSCOPY  2015   Dr Allen Norris- cleared for 5 years   . COLONOSCOPY WITH  PROPOFOL N/A 01/17/2018   Procedure: COLONOSCOPY WITH PROPOFOL;  Surgeon: Lucilla Lame, MD;  Location: [place];  Service: Endoscopy;  Laterality: N/A;  requests early  . INSERTION OF MESH N/A 08/31/2017   Procedure: INSERTION OF MESH;  Surgeon: Vickie Epley, MD;  Location: [facility];  Service: General;  Laterality: N/A;  . POLYPECTOMY  01/17/2018   Procedure: POLYPECTOMY;  Surgeon: Lucilla Lame, MD;  Location: [place];  Service: Endoscopy;;  . UMBILICAL HERNIA REPAIR N/A 08/31/2017   Procedure: HERNIA REPAIR UMBILICAL ADULT;  Surgeon: Vickie Epley, MD;  Location: [facility];  Service: General;  Laterality: N/A;  . VEIN SURGERY     2 stents in R) leg    Social History   Tobacco Use  . Smoking status: Current Every Day Smoker    Packs/day: 1.50    Years: 50.00    Pack years: 75.00  . Smokeless tobacco: Never Used  . Tobacco comment: pt wants to try patches again but states they are too costly  Vaping Use  . Vaping Use: Never used  Substance Use Topics  . Alcohol use: Yes    Alcohol/week: 1.0 standard drink    Types: 1 Cans of beer per week    Comment: once a month  . Drug use: No     Medication list has been reviewed and updated.  Current Meds  Medication Sig  . acetaminophen (TYLENOL) 325 MG tablet Take 325 mg by mouth daily as needed for moderate pain or headache.  . cetirizine (ZYRTEC) 10 MG tablet Take 1 tablet (10 mg total) by mouth daily.  . clonazePAM (KLONOPIN) 0.5 MG tablet Take 0.5 tablets (0.25 mg total) by mouth daily. Take one half daily  . clopidogrel (PLAVIX) 75 MG tablet Take 1 tablet (75 mg total) by mouth daily.  Marland Kitchen dexlansoprazole (DEXILANT) 60 MG capsule Take 1 capsule (60 mg total) by mouth daily.  . fluticasone (FLONASE) 50 MCG/ACT nasal spray SPRAY 2 SPRAYS INTO EACH NOSTRIL EVERY DAY  . ibuprofen (ADVIL,MOTRIN) 200 MG tablet Take 400 mg by mouth every 8 (eight) hours as needed (for pain).  Marland Kitchen levothyroxine (SYNTHROID) 125 MCG  tablet TAKE 1 TABLET BY MOUTH EVERY DAY  . losartan (COZAAR) 100 MG tablet Take 100 mg by mouth daily.  . rosuvastatin (CRESTOR) 20 MG tablet Take 1 tablet (20 mg total) by mouth daily.  . sertraline (ZOLOFT) 100 MG tablet Take 1 tablet (100 mg total) by mouth daily.    PHQ 2/9 Scores 08/23/2020 07/31/2020 05/20/2020 11/16/2019  PHQ - 2 Score 0 0 0 0  PHQ- 9 Score 0 - 0 0    GAD 7 : Generalized Anxiety Score 08/23/2020 05/20/2020 11/16/2019 06/26/2019  Nervous, Anxious, on Edge 1 0 3 0  Control/stop worrying 0 0 0 0  Worry too much - different things 0 0 0 0  Trouble relaxing 0 0 0 0  Restless 0 0 0 0  Easily annoyed or irritable 0 0 0 0  Afraid - awful might happen 0 0 1 0  Total GAD 7 Score 1 0 4 0  Anxiety Difficulty Not difficult at all - Not difficult at all -    BP  Readings from Last 3 Encounters:  08/23/20 132/82  07/31/20 118/82  07/23/20 (!) 157/91    Physical Exam Vitals and nursing note reviewed.  HENT:     Head: Normocephalic.     Right Ear: Tympanic membrane, ear canal and external ear normal. There is no impacted cerumen.     Left Ear: Tympanic membrane, ear canal and external ear normal. There is no impacted cerumen.     Nose: Nose normal. No congestion or rhinorrhea.     Mouth/Throat:     Mouth: Mucous membranes are moist.  Eyes:     General: No scleral icterus.       Right eye: No discharge.        Left eye: No discharge.     Conjunctiva/sclera: Conjunctivae normal.     Pupils: Pupils are equal, round, and reactive to light.  Neck:     Thyroid: No thyromegaly.     Vascular: No JVD.     Trachea: No tracheal deviation.  Cardiovascular:     Rate and Rhythm: Normal rate and regular rhythm.     Heart sounds: Normal heart sounds. No murmur heard. No friction rub. No gallop.   Pulmonary:     Effort: No respiratory distress.     Breath sounds: Normal breath sounds. No wheezing, rhonchi or rales.  Chest:     Chest wall: No tenderness.  Abdominal:     General:  Bowel sounds are normal.     Palpations: Abdomen is soft. There is no mass.     Tenderness: There is no abdominal tenderness. There is no guarding or rebound.  Musculoskeletal:        General: No tenderness. Normal range of motion.     Cervical back: Normal range of motion and neck supple.  Lymphadenopathy:     Cervical: No cervical adenopathy.  Skin:    General: Skin is warm.     Findings: No bruising, erythema or rash.  Neurological:     Mental Status: He is alert and oriented to person, place, and time.     Cranial Nerves: No cranial nerve deficit.     Deep Tendon Reflexes: Reflexes are normal and symmetric.     Wt Readings from Last 3 Encounters:  08/23/20 171 lb (77.6 kg)  07/31/20 170 lb 12.8 oz (77.5 kg)  07/23/20 171 lb (77.6 kg)    BP 132/82   Pulse 72   Temp 97.6 F (36.4 C) (Oral)   Ht 5\' 8"  (1.727 m)   Wt 171 lb (77.6 kg)   SpO2 98%   BMI 26.00 kg/m   Assessment and Plan:  1. Acute maxillary sinusitis, recurrence not specified Acute.  Episodic.  Stable.  Patient has a productive nasal discharge and exam consistent with maxillary sinusitis.  Patient recently returned from a trip from [place] and likely has developed some upper respiratory congestion which is eventually evolved into sinusitis.  We will treat with amoxicillin 500 mg 3 times a day for 10 days - amoxicillin (AMOXIL) 500 MG capsule; Take 1 capsule (500 mg total) by mouth 3 (three) times daily for 10 days.  Dispense: 30 capsule; Refill: 0

## 2020-09-05 ENCOUNTER — Other Ambulatory Visit: Payer: Self-pay | Admitting: Family Medicine

## 2020-09-05 DIAGNOSIS — E039 Hypothyroidism, unspecified: Secondary | ICD-10-CM

## 2020-09-12 DIAGNOSIS — I6523 Occlusion and stenosis of bilateral carotid arteries: Secondary | ICD-10-CM | POA: Diagnosis not present

## 2020-09-12 DIAGNOSIS — I2510 Atherosclerotic heart disease of native coronary artery without angina pectoris: Secondary | ICD-10-CM | POA: Diagnosis not present

## 2020-09-12 DIAGNOSIS — I10 Essential (primary) hypertension: Secondary | ICD-10-CM | POA: Diagnosis not present

## 2020-09-12 DIAGNOSIS — I70219 Atherosclerosis of native arteries of extremities with intermittent claudication, unspecified extremity: Secondary | ICD-10-CM | POA: Diagnosis not present

## 2020-09-12 DIAGNOSIS — E782 Mixed hyperlipidemia: Secondary | ICD-10-CM | POA: Diagnosis not present

## 2020-09-12 DIAGNOSIS — I25118 Atherosclerotic heart disease of native coronary artery with other forms of angina pectoris: Secondary | ICD-10-CM | POA: Diagnosis not present

## 2020-10-29 ENCOUNTER — Other Ambulatory Visit: Payer: Self-pay | Admitting: Family Medicine

## 2020-10-29 DIAGNOSIS — E039 Hypothyroidism, unspecified: Secondary | ICD-10-CM

## 2020-11-11 ENCOUNTER — Other Ambulatory Visit: Payer: Self-pay | Admitting: Family Medicine

## 2020-11-13 ENCOUNTER — Other Ambulatory Visit: Payer: Self-pay

## 2020-11-13 ENCOUNTER — Encounter: Payer: Self-pay | Admitting: Family Medicine

## 2020-11-13 ENCOUNTER — Ambulatory Visit: Payer: Medicare PPO | Admitting: Family Medicine

## 2020-11-13 DIAGNOSIS — F419 Anxiety disorder, unspecified: Secondary | ICD-10-CM

## 2020-11-13 DIAGNOSIS — E039 Hypothyroidism, unspecified: Secondary | ICD-10-CM | POA: Diagnosis not present

## 2020-11-13 DIAGNOSIS — K219 Gastro-esophageal reflux disease without esophagitis: Secondary | ICD-10-CM

## 2020-11-13 DIAGNOSIS — I700 Atherosclerosis of aorta: Secondary | ICD-10-CM

## 2020-11-13 DIAGNOSIS — F3342 Major depressive disorder, recurrent, in full remission: Secondary | ICD-10-CM | POA: Diagnosis not present

## 2020-11-13 LAB — POCT URINALYSIS DIPSTICK
Bilirubin, UA: NEGATIVE
Blood, UA: NEGATIVE
Glucose, UA: NEGATIVE
Ketones, UA: NEGATIVE
Leukocytes, UA: NEGATIVE
Nitrite, UA: NEGATIVE
Protein, UA: NEGATIVE
Spec Grav, UA: 1.01 (ref 1.010–1.025)
Urobilinogen, UA: 0.2 E.U./dL
pH, UA: 6 (ref 5.0–8.0)

## 2020-11-13 MED ORDER — DEXLANSOPRAZOLE 60 MG PO CPDR: 1.0000 | DELAYED_RELEASE_CAPSULE | Freq: Every day | ORAL | 1 refills | Status: DC

## 2020-11-13 MED ORDER — SERTRALINE HCL 100 MG PO TABS: 100.0000 mg | ORAL_TABLET | Freq: Every day | ORAL | 1 refills | Status: DC

## 2020-11-13 MED ORDER — CLONAZEPAM 0.5 MG PO TABS: 0.2500 mg | ORAL_TABLET | Freq: Every day | ORAL | 5 refills | Status: DC

## 2020-11-13 MED ORDER — CETIRIZINE HCL 10 MG PO TABS: 10.0000 mg | ORAL_TABLET | Freq: Every day | ORAL | 1 refills | Status: DC

## 2020-11-13 MED ORDER — LEVOTHYROXINE SODIUM 125 MCG PO TABS: 125.0000 ug | ORAL_TABLET | Freq: Every day | ORAL | 1 refills | Status: DC

## 2020-11-13 MED ORDER — FLUTICASONE PROPIONATE 50 MCG/ACT NA SUSP: NASAL | 1 refills | Status: DC

## 2020-11-13 MED ORDER — CLOPIDOGREL BISULFATE 75 MG PO TABS: 75.0000 mg | ORAL_TABLET | Freq: Every day | ORAL | 1 refills | Status: DC

## 2020-11-13 NOTE — Progress Notes (Signed)
Date:  11/13/2020   Name:  Joel Flores   DOB:  Jul 01, 1950   MRN:  [id number]   Chief Complaint: Allergic Rhinitis , Gastroesophageal Reflux, Anxiety, Hypothyroidism, and Depression (Wants to cut back on zoloft)  Gastroesophageal Reflux He reports no abdominal Flores, no belching, no chest Flores, no choking, no coughing, no dysphagia, no early satiety, no heartburn, no hoarse voice, no nausea, no sore throat or no wheezing. This is a chronic problem. The problem occurs occasionally. The symptoms are aggravated by certain foods. Pertinent negatives include no anemia, fatigue, melena, muscle weakness, orthopnea or weight loss. He has tried a PPI for the symptoms. The treatment provided moderate relief.  Anxiety Presents for follow-up visit. Symptoms include excessive worry and nervous/anxious behavior. Patient reports no chest Flores, decreased concentration, insomnia, malaise, muscle tension, nausea, restlessness, shortness of breath or suicidal ideas.    Depression        This is a chronic problem.  The current episode started more than 1 year ago.   The onset quality is gradual.   The problem occurs intermittently.  The problem has been gradually improving since onset.  Associated symptoms include no decreased concentration, no fatigue, no helplessness, no hopelessness, does not have insomnia, not irritable, no restlessness, no decreased interest, no appetite change, no body aches, no myalgias, no headaches, no indigestion, not sad and no suicidal ideas.  Past treatments include SSRIs - Selective serotonin reuptake inhibitors.  Compliance with treatment is variable.  Previous treatment provided mild relief.  Past medical history includes anxiety.    Lab Results  Component Value Date   CREATININE 0.77 05/20/2020   BUN 11 05/20/2020   NA 140 05/20/2020   K 3.9 05/20/2020   CL 101 05/20/2020   CO2 23 05/20/2020   Lab Results  Component Value Date   CHOL 145 05/20/2020   HDL 37 (L) 05/20/2020    LDLCALC 81 05/20/2020   TRIG 156 (H) 05/20/2020   CHOLHDL 3.7 06/17/2018   Lab Results  Component Value Date   TSH 3.150 07/11/2020   No results found for: HGBA1C Lab Results  Component Value Date   WBC 8.5 11/11/2018   HGB 15.1 11/11/2018   HCT 44.1 11/11/2018   MCV 86 11/11/2018   PLT 322 11/11/2018   Lab Results  Component Value Date   ALT 19 05/20/2020   AST 24 05/20/2020   ALKPHOS 98 05/20/2020   BILITOT 0.4 05/20/2020     Review of Systems  Constitutional:  Negative for appetite change, fatigue and weight loss.  HENT:  Negative for hoarse voice and sore throat.   Respiratory:  Negative for cough, choking, shortness of breath and wheezing.   Cardiovascular:  Negative for chest Flores.  Gastrointestinal:  Negative for abdominal Flores, dysphagia, heartburn, melena and nausea.  Endocrine: Negative for polydipsia and polyuria.  Genitourinary:  Negative for difficulty urinating.  Musculoskeletal:  Negative for myalgias and muscle weakness.  Neurological:  Negative for tremors and headaches.  Psychiatric/Behavioral:  Positive for depression. Negative for decreased concentration and suicidal ideas. The patient is nervous/anxious. The patient does not have insomnia.    Patient Active Problem List   Diagnosis Date Noted   Bilateral carotid artery stenosis 09/20/2019   Colon cancer screening    Polyp of sigmoid colon    SOBOE (shortness of breath on exertion) 07/30/2017   PVD (peripheral vascular disease) ([place]) 11/17/2016   Tobacco use disorder 11/17/2016   Reactive airway disease, mild intermittent, uncomplicated  08/12/2016   Allergic rhinitis due to pollen 08/13/2015   Nocturia 08/13/2015   Atherosclerosis of aorta ([place]) 02/12/2015   Essential hypertension 02/12/2015   Esophageal reflux 02/12/2015   Depression 02/12/2015   Adult hypothyroidism 02/12/2015   Chronic anxiety 02/12/2015   Hyperlipemia 02/12/2015    Allergies  Allergen Reactions   Sulfa Antibiotics  Rash    Past Surgical History:  Procedure Laterality Date   CATARACT EXTRACTION W/PHACO Right 03/16/2018   Procedure: CATARACT EXTRACTION PHACO AND INTRAOCULAR LENS PLACEMENT ([place]);  Surgeon: Marchia Meiers, MD;  Location: [facility];  Service: Ophthalmology;  Laterality: Right;  [place]  01:05 CDE 11.85 Fluid pack lot # [id number]   CATARACT EXTRACTION W/PHACO Left 05/08/2020   Procedure: CATARACT EXTRACTION PHACO AND INTRAOCULAR LENS PLACEMENT (IOC) LEFT 7.32 00:54.7 13.4%;  Surgeon: Leandrew Koyanagi, MD;  Location: [place];  Service: Ophthalmology;  Laterality: Left;   CHOLECYSTECTOMY     COLONOSCOPY  2015   Dr Allen Norris- cleared for 5 years    COLONOSCOPY WITH PROPOFOL N/A 01/17/2018   Procedure: COLONOSCOPY WITH PROPOFOL;  Surgeon: Lucilla Lame, MD;  Location: [place];  Service: Endoscopy;  Laterality: N/A;  requests early   INSERTION OF MESH N/A 08/31/2017   Procedure: INSERTION OF MESH;  Surgeon: Vickie Epley, MD;  Location: [facility];  Service: General;  Laterality: N/A;   POLYPECTOMY  01/17/2018   Procedure: POLYPECTOMY;  Surgeon: Lucilla Lame, MD;  Location: [place];  Service: Endoscopy;;   UMBILICAL HERNIA REPAIR N/A 08/31/2017   Procedure: HERNIA REPAIR UMBILICAL ADULT;  Surgeon: Vickie Epley, MD;  Location: [facility];  Service: General;  Laterality: N/A;   VEIN SURGERY     2 stents in R) leg    Social History   Tobacco Use   Smoking status: Every Day    Packs/day: 1.50    Years: 50.00    Pack years: 75.00    Types: Cigarettes   Smokeless tobacco: Never   Tobacco comments:    pt wants to try patches again but states they are too costly  Vaping Use   Vaping Use: Never used  Substance Use Topics   Alcohol use: Yes    Alcohol/week: 1.0 standard drink    Types: 1 Cans of beer per week    Comment: once a month   Drug use: No     Medication list has been reviewed and updated.  Current Meds  Medication Sig   acetaminophen (TYLENOL)  325 MG tablet Take 325 mg by mouth daily as needed for moderate Flores or headache.   cetirizine (ZYRTEC) 10 MG tablet TAKE 1 TABLET BY MOUTH EVERY DAY   clonazePAM (KLONOPIN) 0.5 MG tablet Take 0.5 tablets (0.25 mg total) by mouth daily. Take one half daily   clopidogrel (PLAVIX) 75 MG tablet Take 1 tablet (75 mg total) by mouth daily.   dexlansoprazole (DEXILANT) 60 MG capsule Take 1 capsule (60 mg total) by mouth daily.   fluticasone (FLONASE) 50 MCG/ACT nasal spray SPRAY 2 SPRAYS INTO EACH NOSTRIL EVERY DAY   ibuprofen (ADVIL,MOTRIN) 200 MG tablet Take 400 mg by mouth every 8 (eight) hours as needed (for Flores).   levothyroxine (SYNTHROID) 125 MCG tablet TAKE 1 TABLET BY MOUTH EVERY DAY   losartan (COZAAR) 100 MG tablet Take 100 mg by mouth daily.   rosuvastatin (CRESTOR) 40 MG tablet Take 1 tablet by mouth daily. [place]   sertraline (ZOLOFT) 100 MG tablet Take 1 tablet (100 mg total) by  mouth daily.   [DISCONTINUED] albuterol (VENTOLIN HFA) 108 (90 Base) MCG/ACT inhaler Inhale 2 puffs into the lungs every 6 (six) hours as needed for wheezing or shortness of breath.    PHQ 2/9 Scores 11/13/2020 08/23/2020 07/31/2020 05/20/2020  PHQ - 2 Score 0 0 0 0  PHQ- 9 Score 0 0 - 0    GAD 7 : Generalized Anxiety Score 11/13/2020 08/23/2020 05/20/2020 11/16/2019  Nervous, Anxious, on Edge 0 1 0 3  Control/stop worrying 0 0 0 0  Worry too much - different things 0 0 0 0  Trouble relaxing 0 0 0 0  Restless 0 0 0 0  Easily annoyed or irritable 0 0 0 0  Afraid - awful might happen 0 0 0 1  Total GAD 7 Score 0 1 0 4  Anxiety Difficulty - Not difficult at all - Not difficult at all    BP Readings from Last 3 Encounters:  11/13/20 128/74  08/23/20 132/82  07/31/20 118/82    Physical Exam Vitals and nursing note reviewed.  Constitutional:      General: He is not irritable. HENT:     Head: Normocephalic.     Right Ear: Tympanic membrane, ear canal and external ear normal.     Left Ear: Tympanic  membrane, ear canal and external ear normal.     Nose: Nose normal. No congestion or rhinorrhea.  Eyes:     General: No scleral icterus.       Right eye: No discharge.        Left eye: No discharge.     Conjunctiva/sclera: Conjunctivae normal.     Pupils: Pupils are equal, round, and reactive to light.  Neck:     Thyroid: No thyromegaly.     Vascular: No JVD.     Trachea: No tracheal deviation.  Cardiovascular:     Rate and Rhythm: Normal rate and regular rhythm.     Heart sounds: Normal heart sounds, S1 normal and S2 normal. No murmur heard. No systolic murmur is present.  No diastolic murmur is present.    No friction rub. No gallop. No S3 or S4 sounds.  Pulmonary:     Effort: No respiratory distress.     Breath sounds: Normal breath sounds. No wheezing, rhonchi or rales.  Abdominal:     General: Bowel sounds are normal.     Palpations: Abdomen is soft. There is no mass.     Tenderness: There is no abdominal tenderness. There is no guarding or rebound.  Musculoskeletal:        General: No tenderness. Normal range of motion.     Cervical back: Normal range of motion and neck supple.  Lymphadenopathy:     Cervical: No cervical adenopathy.  Skin:    General: Skin is warm.     Findings: No rash.  Neurological:     Mental Status: He is alert and oriented to person, place, and time.     Cranial Nerves: No cranial nerve deficit.     Deep Tendon Reflexes: Reflexes are normal and symmetric.    Wt Readings from Last 3 Encounters:  11/13/20 168 lb (76.2 kg)  08/23/20 171 lb (77.6 kg)  07/31/20 170 lb 12.8 oz (77.5 kg)    BP 128/74   Pulse 88   Ht 5\' 8"  (1.727 m)   Wt 168 lb (76.2 kg)   BMI 25.54 kg/m   Assessment and Plan:  1. Chronic anxiety   Chronic.  Controlled.  Stable.  Continue clonazepam 0.  2 5 mg daily. - clonazePAM (KLONOPIN) 0.5 MG tablet; Take 0.5 tablets (0.25 mg total) by mouth daily. Take one half daily  Dispense: 15 tablet; Refill: 5  2.  Atherosclerosis of aorta [facility]) Patient has noted atherosclerosis which he has been encouraged to quit smoking.  He will continue Plavix 75 mg once a day.  He will continue to control his cholesterol with triglyceride and cholesterol management. - clopidogrel (PLAVIX) 75 MG tablet; Take 1 tablet (75 mg total) by mouth daily.  Dispense: 90 tablet; Refill: 1  3. Gastroesophageal reflux disease, unspecified whether esophagitis present Chronic.  Controlled.  Stable.  Continue Dexilant 60 mg once a day. - dexlansoprazole (DEXILANT) 60 MG capsule; Take 1 capsule (60 mg total) by mouth daily.  Dispense: 90 capsule; Refill: 1  4. Adult hypothyroidism Review of patient's previous TSH was in normal range in March we will continue Synthroid 125 mcg daily. - levothyroxine (SYNTHROID) 125 MCG tablet; Take 1 tablet (125 mcg total) by mouth daily.  Dispense: 90 tablet; Refill: 1  5. Recurrent major depressive disorder, in full remission ([place]) Chronic.  Controlled.  Stable.  Continue sertraline 100 mg once a day. - sertraline (ZOLOFT) 100 MG tablet; Take 1 tablet (100 mg total) by mouth daily.  Dispense: 90 tablet; Refill: 1

## 2020-11-19 ENCOUNTER — Ambulatory Visit: Payer: Medicare PPO | Admitting: Family Medicine

## 2020-11-21 DIAGNOSIS — L578 Other skin changes due to chronic exposure to nonionizing radiation: Secondary | ICD-10-CM | POA: Diagnosis not present

## 2020-11-21 DIAGNOSIS — L570 Actinic keratosis: Secondary | ICD-10-CM | POA: Diagnosis not present

## 2020-11-21 DIAGNOSIS — Z872 Personal history of diseases of the skin and subcutaneous tissue: Secondary | ICD-10-CM | POA: Diagnosis not present

## 2020-11-21 DIAGNOSIS — D1801 Hemangioma of skin and subcutaneous tissue: Secondary | ICD-10-CM | POA: Diagnosis not present

## 2021-02-10 DIAGNOSIS — Z961 Presence of intraocular lens: Secondary | ICD-10-CM | POA: Diagnosis not present

## 2021-02-25 ENCOUNTER — Encounter: Payer: Self-pay | Admitting: Family Medicine

## 2021-02-25 ENCOUNTER — Ambulatory Visit: Payer: Medicare PPO | Admitting: Family Medicine

## 2021-02-25 ENCOUNTER — Other Ambulatory Visit: Payer: Self-pay

## 2021-02-25 VITALS — BP 120/80 | HR 76 | Ht 68.0 in | Wt 170.0 lb

## 2021-02-25 DIAGNOSIS — J00 Acute nasopharyngitis [common cold]: Secondary | ICD-10-CM | POA: Diagnosis not present

## 2021-02-25 DIAGNOSIS — J0100 Acute maxillary sinusitis, unspecified: Secondary | ICD-10-CM | POA: Diagnosis not present

## 2021-02-25 MED ORDER — AMOXICILLIN 500 MG PO CAPS: 500.0000 mg | ORAL_CAPSULE | Freq: Three times a day (TID) | ORAL | 0 refills | Status: AC

## 2021-02-25 MED ORDER — AZELASTINE HCL 0.1 % NA SOLN: 2.0000 | Freq: Two times a day (BID) | NASAL | 12 refills | Status: DC

## 2021-02-25 NOTE — Progress Notes (Signed)
Date:  02/25/2021   Name:  Joel Flores   DOB:  Nov 03, 1950   MRN:  [id number]   Chief Complaint: Sinusitis (Cough, congestion. COVID- negative this morning)  Sinusitis This is a new problem. The current episode started in the past 7 days. The problem has been gradually worsening since onset. There has been no fever. The pain is mild. Associated symptoms include congestion, coughing, headaches, shortness of breath, sinus pressure and sneezing. Pertinent negatives include no chills, diaphoresis, ear pain, hoarse voice, neck pain, sore throat or swollen glands.   Lab Results  Component Value Date   CREATININE 0.77 05/20/2020   BUN 11 05/20/2020   NA 140 05/20/2020   K 3.9 05/20/2020   CL 101 05/20/2020   CO2 23 05/20/2020   Lab Results  Component Value Date   CHOL 145 05/20/2020   HDL 37 (L) 05/20/2020   LDLCALC 81 05/20/2020   TRIG 156 (H) 05/20/2020   CHOLHDL 3.7 06/17/2018   Lab Results  Component Value Date   TSH 3.150 07/11/2020   No results found for: HGBA1C Lab Results  Component Value Date   WBC 8.5 11/11/2018   HGB 15.1 11/11/2018   HCT 44.1 11/11/2018   MCV 86 11/11/2018   PLT 322 11/11/2018   Lab Results  Component Value Date   ALT 19 05/20/2020   AST 24 05/20/2020   ALKPHOS 98 05/20/2020   BILITOT 0.4 05/20/2020     Review of Systems  Constitutional:  Negative for chills, diaphoresis and fever.  HENT:  Positive for congestion, sinus pressure and sneezing. Negative for ear pain, hoarse voice and sore throat.   Respiratory:  Positive for cough and shortness of breath. Negative for wheezing.   Musculoskeletal:  Negative for neck pain.  Neurological:  Positive for headaches.   Patient Active Problem List   Diagnosis Date Noted   Bilateral carotid artery stenosis 09/20/2019   Colon cancer screening    Polyp of sigmoid colon    SOBOE (shortness of breath on exertion) 07/30/2017   PVD (peripheral vascular disease) ([place]) 11/17/2016   Tobacco use  disorder 11/17/2016   Reactive airway disease, mild intermittent, uncomplicated 38/75/6433   Allergic rhinitis due to pollen 08/13/2015   Nocturia 08/13/2015   Atherosclerosis of aorta ([place]) 02/12/2015   Essential hypertension 02/12/2015   Esophageal reflux 02/12/2015   Depression 02/12/2015   Adult hypothyroidism 02/12/2015   Chronic anxiety 02/12/2015   Hyperlipemia 02/12/2015    Allergies  Allergen Reactions   Sulfa Antibiotics Rash    Past Surgical History:  Procedure Laterality Date   CATARACT EXTRACTION W/PHACO Right 03/16/2018   Procedure: CATARACT EXTRACTION PHACO AND INTRAOCULAR LENS PLACEMENT ([place]);  Surgeon: Marchia Meiers, MD;  Location: [facility];  Service: Ophthalmology;  Laterality: Right;  [place]  01:05 CDE 11.85 Fluid pack lot # [id number]   CATARACT EXTRACTION W/PHACO Left 05/08/2020   Procedure: CATARACT EXTRACTION PHACO AND INTRAOCULAR LENS PLACEMENT ([facility]) LEFT 7.32 00:54.7 13.4%;  Surgeon: Leandrew Koyanagi, MD;  Location: [place];  Service: Ophthalmology;  Laterality: Left;   CHOLECYSTECTOMY     COLONOSCOPY  2015   Dr Allen Norris- cleared for 5 years    COLONOSCOPY WITH PROPOFOL N/A 01/17/2018   Procedure: COLONOSCOPY WITH PROPOFOL;  Surgeon: Lucilla Lame, MD;  Location: [place];  Service: Endoscopy;  Laterality: N/A;  requests early   INSERTION OF MESH N/A 08/31/2017   Procedure: INSERTION OF MESH;  Surgeon: Vickie Epley, MD;  Location: [facility];  Service: General;  Laterality: N/A;   POLYPECTOMY  01/17/2018   Procedure: POLYPECTOMY;  Surgeon: Lucilla Lame, MD;  Location: [place];  Service: Endoscopy;;   UMBILICAL HERNIA REPAIR N/A 08/31/2017   Procedure: HERNIA REPAIR UMBILICAL ADULT;  Surgeon: Vickie Epley, MD;  Location: [facility];  Service: General;  Laterality: N/A;   VEIN SURGERY     2 stents in R) leg    Social History   Tobacco Use   Smoking status: Every Day    Packs/day: 1.50    Years: 50.00    Pack years: 75.00     Types: Cigarettes   Smokeless tobacco: Never   Tobacco comments:    pt wants to try patches again but states they are too costly  Vaping Use   Vaping Use: Never used  Substance Use Topics   Alcohol use: Yes    Alcohol/week: 1.0 standard drink    Types: 1 Cans of beer per week    Comment: once a month   Drug use: No     Medication list has been reviewed and updated.  Current Meds  Medication Sig   acetaminophen (TYLENOL) 325 MG tablet Take 325 mg by mouth daily as needed for moderate pain or headache.   cetirizine (ZYRTEC) 10 MG tablet Take 1 tablet (10 mg total) by mouth daily.   clonazePAM (KLONOPIN) 0.5 MG tablet Take 0.5 tablets (0.25 mg total) by mouth daily. Take one half daily   clopidogrel (PLAVIX) 75 MG tablet Take 1 tablet (75 mg total) by mouth daily.   dexlansoprazole (DEXILANT) 60 MG capsule Take 1 capsule (60 mg total) by mouth daily.   fenofibrate micronized (LOFIBRA) 134 MG capsule Take 1 capsule by mouth daily with breakfast. Nehemiah Massed   fluticasone (FLONASE) 50 MCG/ACT nasal spray SPRAY 2 SPRAYS INTO EACH NOSTRIL EVERY DAY   ibuprofen (ADVIL,MOTRIN) 200 MG tablet Take 400 mg by mouth every 8 (eight) hours as needed (for pain).   levothyroxine (SYNTHROID) 125 MCG tablet Take 1 tablet (125 mcg total) by mouth daily.   losartan (COZAAR) 100 MG tablet Take 100 mg by mouth daily.   rosuvastatin (CRESTOR) 40 MG tablet Take 1 tablet by mouth daily. [place]   sertraline (ZOLOFT) 100 MG tablet Take 1 tablet (100 mg total) by mouth daily.    PHQ 2/9 Scores 02/25/2021 11/13/2020 08/23/2020 07/31/2020  PHQ - 2 Score 0 0 0 0  PHQ- 9 Score 0 0 0 -    GAD 7 : Generalized Anxiety Score 02/25/2021 11/13/2020 08/23/2020 05/20/2020  Nervous, Anxious, on Edge 0 0 1 0  Control/stop worrying 0 0 0 0  Worry too much - different things 0 0 0 0  Trouble relaxing 0 0 0 0  Restless 0 0 0 0  Easily annoyed or irritable 0 0 0 0  Afraid - awful might happen 0 0 0 0  Total GAD 7 Score 0 0  1 0  Anxiety Difficulty - - Not difficult at all -    BP Readings from Last 3 Encounters:  02/25/21 120/80  11/13/20 128/74  08/23/20 132/82    Physical Exam Vitals and nursing note reviewed.  HENT:     Head: Normocephalic.     Right Ear: Tympanic membrane, ear canal and external ear normal.     Left Ear: Tympanic membrane, ear canal and external ear normal.     Nose: Congestion present. No rhinorrhea.     Right Turbinates: Swollen.     Left Turbinates:  Swollen.     Right Sinus: No maxillary sinus tenderness or frontal sinus tenderness.     Left Sinus: Maxillary sinus tenderness present. No frontal sinus tenderness.     Mouth/Throat:     Pharynx: Oropharynx is clear. No pharyngeal swelling or posterior oropharyngeal erythema.  Eyes:     General: No scleral icterus.       Right eye: No discharge.        Left eye: No discharge.     Conjunctiva/sclera: Conjunctivae normal.     Pupils: Pupils are equal, round, and reactive to light.  Neck:     Thyroid: No thyromegaly.     Vascular: No JVD.     Trachea: No tracheal deviation.  Cardiovascular:     Rate and Rhythm: Normal rate and regular rhythm.     Heart sounds: Normal heart sounds. No murmur heard.   No friction rub. No gallop.  Pulmonary:     Effort: No respiratory distress.     Breath sounds: Normal breath sounds. No wheezing, rhonchi or rales.  Abdominal:     General: Bowel sounds are normal.     Palpations: Abdomen is soft. There is no mass.     Tenderness: There is no abdominal tenderness. There is no guarding or rebound.  Musculoskeletal:        General: No tenderness. Normal range of motion.     Cervical back: Normal range of motion and neck supple.  Lymphadenopathy:     Cervical: No cervical adenopathy.  Skin:    General: Skin is warm.     Findings: No rash.  Neurological:     Mental Status: He is alert and oriented to person, place, and time.     Cranial Nerves: No cranial nerve deficit.     Deep Tendon  Reflexes: Reflexes are normal and symmetric.    Wt Readings from Last 3 Encounters:  02/25/21 170 lb (77.1 kg)  11/13/20 168 lb (76.2 kg)  08/23/20 171 lb (77.6 kg)    BP 120/80   Pulse 76   Ht 5\' 8"  (1.727 m)   Wt 170 lb (77.1 kg)   BMI 25.85 kg/m   Assessment and Plan:

## 2021-03-06 DIAGNOSIS — I6523 Occlusion and stenosis of bilateral carotid arteries: Secondary | ICD-10-CM | POA: Diagnosis not present

## 2021-03-06 DIAGNOSIS — R0602 Shortness of breath: Secondary | ICD-10-CM | POA: Diagnosis not present

## 2021-03-06 DIAGNOSIS — I70219 Atherosclerosis of native arteries of extremities with intermittent claudication, unspecified extremity: Secondary | ICD-10-CM | POA: Diagnosis not present

## 2021-03-06 DIAGNOSIS — I2510 Atherosclerotic heart disease of native coronary artery without angina pectoris: Secondary | ICD-10-CM | POA: Diagnosis not present

## 2021-03-06 DIAGNOSIS — I10 Essential (primary) hypertension: Secondary | ICD-10-CM | POA: Diagnosis not present

## 2021-03-06 DIAGNOSIS — E782 Mixed hyperlipidemia: Secondary | ICD-10-CM | POA: Diagnosis not present

## 2021-04-29 ENCOUNTER — Encounter: Payer: Self-pay | Admitting: Family Medicine

## 2021-04-29 ENCOUNTER — Other Ambulatory Visit: Payer: Self-pay

## 2021-04-29 ENCOUNTER — Ambulatory Visit: Payer: Medicare PPO | Admitting: Family Medicine

## 2021-04-29 VITALS — BP 134/80 | HR 100 | Ht 68.0 in | Wt 174.0 lb

## 2021-04-29 DIAGNOSIS — S29011A Strain of muscle and tendon of front wall of thorax, initial encounter: Secondary | ICD-10-CM | POA: Diagnosis not present

## 2021-04-29 DIAGNOSIS — R051 Acute cough: Secondary | ICD-10-CM | POA: Diagnosis not present

## 2021-04-29 DIAGNOSIS — J0100 Acute maxillary sinusitis, unspecified: Secondary | ICD-10-CM

## 2021-04-29 MED ORDER — GUAIFENESIN-CODEINE 100-10 MG/5ML PO SYRP: 5.0000 mL | ORAL_SOLUTION | Freq: Three times a day (TID) | ORAL | 0 refills | Status: DC | PRN

## 2021-04-29 MED ORDER — AMOXICILLIN 500 MG PO CAPS
500.0000 mg | ORAL_CAPSULE | Freq: Three times a day (TID) | ORAL | 0 refills | Status: AC
Start: 2021-04-29 — End: 2021-05-09

## 2021-04-29 NOTE — Progress Notes (Signed)
Date:  04/29/2021   Name:  Joel Flores   DOB:  [birth year]   MRN:  [id number]   Chief Complaint: Cough (Sneezing, feels a pain on R) side after having a hard coughing spell. COVID test was negative this am)  Cough This is a recurrent problem. The current episode started 1 to 4 weeks ago. The problem has been waxing and waning. The problem occurs every few minutes. The cough is Productive of purulent sputum. Associated symptoms include chest pain, nasal congestion, rhinorrhea and a sore throat. Pertinent negatives include no fever, hemoptysis, postnasal drip, shortness of breath, weight loss or wheezing. Associated symptoms comments: Sharp discomfort right 10 cartilage/oblique. Nothing aggravates the symptoms. Risk factors for lung disease include smoking/tobacco exposure. Treatments tried: mucinex.   Lab Results  Component Value Date   NA 140 05/20/2020   K 3.9 05/20/2020   CO2 23 05/20/2020   GLUCOSE 107 (H) 05/20/2020   BUN 11 05/20/2020   CREATININE 0.77 05/20/2020   CALCIUM 9.6 05/20/2020   GFRNONAA 92 05/20/2020   Lab Results  Component Value Date   CHOL 145 05/20/2020   HDL 37 (L) 05/20/2020   LDLCALC 81 05/20/2020   TRIG 156 (H) 05/20/2020   CHOLHDL 3.7 06/17/2018   Lab Results  Component Value Date   TSH 3.150 07/11/2020   No results found for: HGBA1C Lab Results  Component Value Date   WBC 8.5 11/11/2018   HGB 15.1 11/11/2018   HCT 44.1 11/11/2018   MCV 86 11/11/2018   PLT 322 11/11/2018   Lab Results  Component Value Date   ALT 19 05/20/2020   AST 24 05/20/2020   ALKPHOS 98 05/20/2020   BILITOT 0.4 05/20/2020   No results found for: 25OHVITD2, 25OHVITD3, VD25OH   Review of Systems  Constitutional:  Negative for fever and weight loss.  HENT:  Positive for rhinorrhea, sinus pain and sore throat. Negative for postnasal drip and sinus pressure.   Respiratory:  Positive for cough. Negative for hemoptysis, shortness of breath and wheezing.   Cardiovascular:   Positive for chest pain. Negative for palpitations and leg swelling.       Chest wall   Patient Active Problem List   Diagnosis Date Noted   Bilateral carotid artery stenosis 09/20/2019   Colon cancer screening    Polyp of sigmoid colon    SOBOE (shortness of breath on exertion) 07/30/2017   PVD (peripheral vascular disease) ([place]) 11/17/2016   Tobacco use disorder 11/17/2016   Reactive airway disease, mild intermittent, uncomplicated 98/92/1194   Allergic rhinitis due to pollen 08/13/2015   Nocturia 08/13/2015   Atherosclerosis of aorta ([place]) 02/12/2015   Essential hypertension 02/12/2015   Esophageal reflux 02/12/2015   Depression 02/12/2015   Adult hypothyroidism 02/12/2015   Chronic anxiety 02/12/2015   Hyperlipemia 02/12/2015    Allergies  Allergen Reactions   Sulfa Antibiotics Rash    Past Surgical History:  Procedure Laterality Date   CATARACT EXTRACTION W/PHACO Right 03/16/2018   Procedure: CATARACT EXTRACTION PHACO AND INTRAOCULAR LENS PLACEMENT ([place]);  Surgeon: Marchia Meiers, MD;  Location: [facility];  Service: Ophthalmology;  Laterality: Right;  [place]  01:05 CDE 11.85 Fluid pack lot # [id number]   CATARACT EXTRACTION W/PHACO Left 05/08/2020   Procedure: CATARACT EXTRACTION PHACO AND INTRAOCULAR LENS PLACEMENT ([facility]) LEFT 7.32 00:54.7 13.4%;  Surgeon: Leandrew Koyanagi, MD;  Location: [place];  Service: Ophthalmology;  Laterality: Left;   CHOLECYSTECTOMY     COLONOSCOPY  2015  Dr Allen Norris- cleared for 5 years    COLONOSCOPY WITH PROPOFOL N/A 01/17/2018   Procedure: COLONOSCOPY WITH PROPOFOL;  Surgeon: Lucilla Lame, MD;  Location: [place];  Service: Endoscopy;  Laterality: N/A;  requests early   INSERTION OF MESH N/A 08/31/2017   Procedure: INSERTION OF MESH;  Surgeon: Vickie Epley, MD;  Location: [facility];  Service: General;  Laterality: N/A;   POLYPECTOMY  01/17/2018   Procedure: POLYPECTOMY;  Surgeon: Lucilla Lame, MD;  Location: [place];  Service: Endoscopy;;   UMBILICAL HERNIA REPAIR N/A 08/31/2017   Procedure: HERNIA REPAIR UMBILICAL ADULT;  Surgeon: Vickie Epley, MD;  Location: [facility];  Service: General;  Laterality: N/A;   VEIN SURGERY     2 stents in R) leg    Social History   Tobacco Use   Smoking status: Every Day    Packs/day: 1.50    Years: 50.00    Pack years: 75.00    Types: Cigarettes   Smokeless tobacco: Never   Tobacco comments:    pt wants to try patches again but states they are too costly  Vaping Use   Vaping Use: Never used  Substance Use Topics   Alcohol use: Yes    Alcohol/week: 1.0 standard drink    Types: 1 Cans of beer per week    Comment: once a month   Drug use: No     Medication list has been reviewed and updated.  Current Meds  Medication Sig   acetaminophen (TYLENOL) 325 MG tablet Take 325 mg by mouth daily as needed for moderate pain or headache.   azelastine (ASTELIN) 0.1 % nasal spray Place 2 sprays into both nostrils 2 (two) times daily. Use in each nostril as directed   cetirizine (ZYRTEC) 10 MG tablet Take 1 tablet (10 mg total) by mouth daily.   clonazePAM (KLONOPIN) 0.5 MG tablet Take 0.5 tablets (0.25 mg total) by mouth daily. Take one half daily   clopidogrel (PLAVIX) 75 MG tablet Take 1 tablet (75 mg total) by mouth daily.   dexlansoprazole (DEXILANT) 60 MG capsule Take 1 capsule (60 mg total) by mouth daily.   fenofibrate micronized (LOFIBRA) 134 MG capsule Take 1 capsule by mouth daily with breakfast. Nehemiah Massed   fluticasone (FLONASE) 50 MCG/ACT nasal spray SPRAY 2 SPRAYS INTO EACH NOSTRIL EVERY DAY   ibuprofen (ADVIL,MOTRIN) 200 MG tablet Take 400 mg by mouth every 8 (eight) hours as needed (for pain).   levothyroxine (SYNTHROID) 125 MCG tablet Take 1 tablet (125 mcg total) by mouth daily.   losartan (COZAAR) 100 MG tablet Take 100 mg by mouth daily.   rosuvastatin (CRESTOR) 40 MG tablet Take 1 tablet by mouth daily. Nehemiah Massed    [facility] 2/9 Scores  02/25/2021 11/13/2020 08/23/2020 07/31/2020  PHQ - 2 Score 0 0 0 0  PHQ- 9 Score 0 0 0 -    GAD 7 : Generalized Anxiety Score 02/25/2021 11/13/2020 08/23/2020 05/20/2020  Nervous, Anxious, on Edge 0 0 1 0  Control/stop worrying 0 0 0 0  Worry too much - different things 0 0 0 0  Trouble relaxing 0 0 0 0  Restless 0 0 0 0  Easily annoyed or irritable 0 0 0 0  Afraid - awful might happen 0 0 0 0  Total GAD 7 Score 0 0 1 0  Anxiety Difficulty - - Not difficult at all -    BP Readings from Last 3 Encounters:  04/29/21 134/80  02/25/21 120/80  11/13/20 128/74    Physical Exam Vitals and nursing note reviewed.  HENT:     Head: Normocephalic.     Jaw: There is normal jaw occlusion.     Right Ear: Tympanic membrane and external ear normal.     Left Ear: Tympanic membrane and external ear normal.     Nose:     Right Turbinates: Not enlarged, swollen or pale.     Left Turbinates: Not enlarged, swollen or pale.     Right Sinus: No maxillary sinus tenderness.     Left Sinus: Maxillary sinus tenderness present.  Eyes:     General: No scleral icterus.       Right eye: No discharge.        Left eye: No discharge.     Conjunctiva/sclera: Conjunctivae normal.     Pupils: Pupils are equal, round, and reactive to light.  Neck:     Thyroid: No thyromegaly.     Vascular: Normal carotid pulses. No carotid bruit, hepatojugular reflux or JVD.     Trachea: Trachea and phonation normal. No tracheal tenderness, tracheostomy, abnormal tracheal secretions or tracheal deviation.  Cardiovascular:     Rate and Rhythm: Normal rate and regular rhythm.     Heart sounds: Normal heart sounds, S1 normal and S2 normal. No murmur heard. No systolic murmur is present.  No diastolic murmur is present.    No friction rub. No gallop.  Pulmonary:     Effort: No respiratory distress.     Breath sounds: Normal breath sounds. No wheezing or rales.  Chest:     Chest wall: Tenderness present.    Abdominal:      General: Bowel sounds are normal.     Palpations: Abdomen is soft. There is no hepatomegaly, splenomegaly or mass.     Tenderness: There is no abdominal tenderness. There is no guarding or rebound.     Hernia: There is no hernia in the umbilical area or ventral area.    Musculoskeletal:        General: No tenderness. Normal range of motion.     Cervical back: Normal range of motion and neck supple.  Lymphadenopathy:     Cervical: No cervical adenopathy.     Right cervical: No superficial, deep or posterior cervical adenopathy.    Left cervical: No superficial, deep or posterior cervical adenopathy.  Skin:    General: Skin is warm.     Findings: No rash.  Neurological:     Mental Status: He is alert and oriented to person, place, and time.     Cranial Nerves: No cranial nerve deficit.     Deep Tendon Reflexes: Reflexes are normal and symmetric.    Wt Readings from Last 3 Encounters:  04/29/21 174 lb (78.9 kg)  02/25/21 170 lb (77.1 kg)  11/13/20 168 lb (76.2 kg)    BP 134/80    Pulse 100    Ht 5\' 8"  (1.727 m)    Wt 174 lb (78.9 kg)    SpO2 98%    BMI 26.46 kg/m   Assessment and Plan:  1. Acute maxillary sinusitis, recurrence not specified New onset.  Tenderness over the left maxillary sinus.  This is consistent with history and physical for acute maxillary sinusitis on the right.  We will initiate amoxicillin 500 mg 3 times a day for 10 days. - amoxicillin (AMOXIL) 500 MG capsule; Take 1 capsule (500 mg total) by mouth 3 (three) times daily for 10 days.  Dispense: 30  capsule; Refill: 0  2. Acute cough Recurrent in that it had stopped but then resumed about a week ago.  There is no rales rhonchi wheezes or rub on auscultation and percussion.  Given that there is an upcoming family function I will use Robitussin-AC 1 teaspoon every 6-8 hours as needed cough. - guaiFENesin-codeine (ROBITUSSIN AC) 100-10 MG/5ML syrup; Take 5 mLs by mouth 3 (three) times daily as needed for cough.   Dispense: 118 mL; Refill: 0  3. Intercostal muscle strain, initial encounter Tenderness noted over the intercostal muscle of the ninth and 10th rib anterior lateral aspect.  Patient will cover this with OTC pain relievers and may take Robitussin with codeine and this will help some at night in particular.

## 2021-05-03 ENCOUNTER — Other Ambulatory Visit: Payer: Self-pay

## 2021-05-03 ENCOUNTER — Ambulatory Visit
Admission: EM | Admit: 2021-05-03 | Discharge: 2021-05-03 | Disposition: A | Discharge status: Home / Self Care | Payer: Medicare PPO | Attending: Physician Assistant | Admitting: Physician Assistant

## 2021-05-03 DIAGNOSIS — Z7901 Long term (current) use of anticoagulants: Secondary | ICD-10-CM

## 2021-05-03 DIAGNOSIS — S61211A Laceration without foreign body of left index finger without damage to nail, initial encounter: Secondary | ICD-10-CM | POA: Diagnosis not present

## 2021-05-03 NOTE — ED Provider Notes (Signed)
[facility]    CSN: [id number] Arrival date & time: 05/03/21  1024      History   Chief Complaint Chief Complaint  Patient presents with   Finger Injury    HPI Joel Flores is a 71 y.o. male presenting for a laceration of the left index finger.  Patient says he cut it while using power tools about 24 hours ago.  Patient has had continued bleeding.  States he has changed gauze bandages that blood soaked through 3 times since yesterday.  Denies any severe pain.  No numbness or tingling.  Patient is on Plavix.  He believes he is up-to-date on tetanus.  No other injuries.  No other complaints.  HPI  Past Medical History:  Diagnosis Date   Anxiety    PANIC ATTACKS   Arthritis    right shoulder   Atherosclerosis of aorta ([place]) 02/12/2015   COPD (chronic obstructive pulmonary disease) (HCC)    Cough    Depression    Environmental and seasonal allergies    Essential hypertension 02/12/2015   Family history of adverse reaction to anesthesia    Father - 78 - MI, then stroke after Prostate surgery   GERD (gastroesophageal reflux disease)    History of hiatal hernia    Hyperlipidemia    Hypertension    Hypothyroidism    Incisional hernia, without obstruction or gangrene    PVD (peripheral vascular disease) ([place]) 11/17/2016   Thyroid disease    Venous insufficiency    Vertigo    can happen daily   Wears hearing aid in both ears    has, does not wear    Patient Active Problem List   Diagnosis Date Noted   Bilateral carotid artery stenosis 09/20/2019   Colon cancer screening    Polyp of sigmoid colon    SOBOE (shortness of breath on exertion) 07/30/2017   PVD (peripheral vascular disease) ([place]) 11/17/2016   Tobacco use disorder 11/17/2016   Reactive airway disease, mild intermittent, uncomplicated 25/85/2778   Allergic rhinitis due to pollen 08/13/2015   Nocturia 08/13/2015   Atherosclerosis of aorta ([place]) 02/12/2015   Essential hypertension 02/12/2015    Esophageal reflux 02/12/2015   Depression 02/12/2015   Adult hypothyroidism 02/12/2015   Chronic anxiety 02/12/2015   Hyperlipemia 02/12/2015    Past Surgical History:  Procedure Laterality Date   CATARACT EXTRACTION W/PHACO Right 03/16/2018   Procedure: CATARACT EXTRACTION PHACO AND INTRAOCULAR LENS PLACEMENT ([place]);  Surgeon: Marchia Meiers, MD;  Location: [facility];  Service: Ophthalmology;  Laterality: Right;  [place]  01:05 CDE 11.85 Fluid pack lot # [id number]   CATARACT EXTRACTION W/PHACO Left 05/08/2020   Procedure: CATARACT EXTRACTION PHACO AND INTRAOCULAR LENS PLACEMENT ([facility]) LEFT 7.32 00:54.7 13.4%;  Surgeon: Leandrew Koyanagi, MD;  Location: [place];  Service: Ophthalmology;  Laterality: Left;   CHOLECYSTECTOMY     COLONOSCOPY  2015   Dr Allen Norris- cleared for 5 years    COLONOSCOPY WITH PROPOFOL N/A 01/17/2018   Procedure: COLONOSCOPY WITH PROPOFOL;  Surgeon: Lucilla Lame, MD;  Location: [place];  Service: Endoscopy;  Laterality: N/A;  requests early   INSERTION OF MESH N/A 08/31/2017   Procedure: INSERTION OF MESH;  Surgeon: Vickie Epley, MD;  Location: [facility];  Service: General;  Laterality: N/A;   POLYPECTOMY  01/17/2018   Procedure: POLYPECTOMY;  Surgeon: Lucilla Lame, MD;  Location: [place];  Service: Endoscopy;;   UMBILICAL HERNIA REPAIR N/A 08/31/2017   Procedure: HERNIA REPAIR  UMBILICAL ADULT;  Surgeon: Vickie Epley, MD;  Location: [facility];  Service: General;  Laterality: N/A;   VEIN SURGERY     2 stents in R) leg       Home Medications    Prior to Admission medications   Medication Sig Start Date End Date Taking? Authorizing Provider  acetaminophen (TYLENOL) 325 MG tablet Take 325 mg by mouth daily as needed for moderate pain or headache.   Yes [provider]  amoxicillin (AMOXIL) 500 MG capsule Take 1 capsule (500 mg total) by mouth 3 (three) times daily for 10 days. 04/29/21 05/09/21 Yes Juline Patch, MD   azelastine (ASTELIN) 0.1 % nasal spray Place 2 sprays into both nostrils 2 (two) times daily. Use in each nostril as directed 02/25/21  Yes Juline Patch, MD  cetirizine (ZYRTEC) 10 MG tablet Take 1 tablet (10 mg total) by mouth daily. 11/13/20  Yes Juline Patch, MD  clopidogrel (PLAVIX) 75 MG tablet Take 1 tablet (75 mg total) by mouth daily. 11/13/20  Yes Juline Patch, MD  dexlansoprazole (DEXILANT) 60 MG capsule Take 1 capsule (60 mg total) by mouth daily. 11/13/20  Yes Juline Patch, MD  fenofibrate micronized (LOFIBRA) 134 MG capsule Take 1 capsule by mouth daily with breakfast. Raynald Kemp [provider]  fluticasone (FLONASE) 50 MCG/ACT nasal spray SPRAY 2 SPRAYS INTO EACH NOSTRIL EVERY DAY 11/13/20  Yes Juline Patch, MD  guaiFENesin-codeine (ROBITUSSIN AC) 100-10 MG/5ML syrup Take 5 mLs by mouth 3 (three) times daily as needed for cough. 04/29/21  Yes Juline Patch, MD  ibuprofen (ADVIL,MOTRIN) 200 MG tablet Take 400 mg by mouth every 8 (eight) hours as needed (for pain).   Yes [provider]  levothyroxine (SYNTHROID) 125 MCG tablet Take 1 tablet (125 mcg total) by mouth daily. 11/13/20  Yes Juline Patch, MD  losartan (COZAAR) 100 MG tablet Take 100 mg by mouth daily.   Yes [provider]  rosuvastatin (CRESTOR) 40 MG tablet Take 1 tablet by mouth daily. Nehemiah Massed 09/12/20 09/12/21 Yes [provider]  sertraline (ZOLOFT) 100 MG tablet Take 1 tablet (100 mg total) by mouth daily. 11/13/20  Yes Juline Patch, MD  clonazePAM (KLONOPIN) 0.5 MG tablet Take 0.5 tablets (0.25 mg total) by mouth daily. Take one half daily 11/13/20   Juline Patch, MD    Family History Family History  Problem Relation Age of Onset   Diabetes Mother    Cancer Father    Heart disease Father    Stroke Father     Social History Social History   Tobacco Use   Smoking status: Every Day    Packs/day: 1.50    Years: 50.00    Pack years: 75.00    Types:  Cigarettes   Smokeless tobacco: Never   Tobacco comments:    pt wants to try patches again but states they are too costly  Vaping Use   Vaping Use: Never used  Substance Use Topics   Alcohol use: Not Currently    Alcohol/week: 1.0 standard drink    Types: 1 Cans of beer per week    Comment: once a month   Drug use: No     Allergies   Sulfa antibiotics   Review of Systems Review of Systems  Musculoskeletal:  Negative for arthralgias and joint swelling.  Skin:  Positive for wound.  Neurological:  Negative for weakness and numbness.  Hematological:  Bruises/bleeds easily.  Physical Exam Triage Vital Signs ED Triage Vitals  Enc Vitals Group     BP      Pulse      Resp      Temp      Temp src      SpO2      Weight      Height      Head Circumference      Peak Flow      Pain Score      Pain Loc      Pain Edu?      Excl. in [place]?    No data found.  Updated Vital Signs BP (!) 153/92 (BP Location: Left Arm)    Pulse 94    Temp 97.8 F (36.6 C) (Oral)    Resp 18    Ht 5\' 8"  (1.727 m)    Wt 172 lb (78 kg)    SpO2 97%    BMI 26.15 kg/m      Physical Exam Vitals and nursing note reviewed.  Constitutional:      General: He is not in acute distress.    Appearance: Normal appearance. He is well-developed. He is not ill-appearing.  HENT:     Head: Normocephalic and atraumatic.  Eyes:     General: No scleral icterus.    Conjunctiva/sclera: Conjunctivae normal.  Cardiovascular:     Rate and Rhythm: Normal rate.     Pulses: Normal pulses.  Pulmonary:     Effort: Pulmonary effort is normal. No respiratory distress.  Musculoskeletal:     Cervical back: Neck supple.  Skin:    General: Skin is warm and dry.     Capillary Refill: Capillary refill takes less than 2 seconds.     Comments: LEFT INDEX FINGER: After cutting through patient's blood soaked bandage, exam reveals 1.5 cm laceration distal left index finger pad with active bleeding. Full ROM of finger, good  pulses and cap refill <2 sec  Neurological:     General: No focal deficit present.     Mental Status: He is alert. Mental status is at baseline.     Motor: No weakness.     Coordination: Coordination normal.     Gait: Gait normal.  Psychiatric:        Mood and Affect: Mood normal.        Behavior: Behavior normal.        Thought Content: Thought content normal.     UC Treatments / Results  Labs (all labs ordered are listed, but only abnormal results are displayed) Labs Reviewed - No data to display  EKG   Radiology No results found.  Procedures Laceration Repair  Date/Time: 05/03/2021 11:54 AM Performed by: Danton Clap, PA-C Authorized by: Danton Clap, PA-C   Consent:    Consent obtained:  Verbal   Consent given by:  Patient   Risks discussed:  Infection, pain, retained foreign body, poor cosmetic result and poor wound healing Universal protocol:    Patient identity confirmed:  Verbally with patient Anesthesia:    Anesthesia method:  None Laceration details:    Location:  Finger   Finger location:  L index finger   Length (cm):  1.5 Pre-procedure details:    Preparation:  Patient was prepped and draped in usual sterile fashion Exploration:    Hemostasis achieved with:  Direct pressure and tourniquet   Wound exploration: entire depth of wound visualized     Contaminated: no   Treatment:  Area cleansed with:  Saline   Amount of cleaning:  Extensive   Irrigation solution:  Sterile saline   Irrigation method:  Syringe   Visualized foreign bodies/material removed: no     Debridement:  None   Undermining:  None Skin repair:    Repair method:  Sutures   Suture size:  4-0   Suture material:  Prolene   Suture technique:  Simple interrupted   Number of sutures:  2 Approximation:    Approximation:  Loose Repair type:    Repair type:  Simple Post-procedure details:    Dressing:  Sterile dressing   Procedure completion:  Tolerated well, no immediate  complications (including critical care time)  Medications Ordered in UC Medications - No data to display  Initial Impression / Assessment and Plan / UC Course  I have reviewed the triage vital signs and the nursing notes.  Pertinent labs & imaging results that were available during my care of the patient were reviewed by me and considered in my medical decision making (see chart for details).  71 year old male on Plavix presenting for laceration of left index finger that continues to bleed over 24 hours.  Upon exam, patient has a bloodsoaked gauze bandage.  1.5 cm underlying laceration that is actively bleeding.  He has good pulses, cap refill and strength.  Full motion of the finger.  I had a long discussion with patient about continuing to change dressings versus me placing a couple of sutures to hold the wound together hopefully help with bleeding.  Discussed the risk of infection when partially closing the wound after 24 hours.  Patient is on amoxicillin for a sinus infection and has been on it for the past 3 days.  Patient would like to have the wound sutured.  I placed 2 sutures, loose approximation.  Covered with gauze bandage and [place].  Advised him to change the bandage at least daily or more often if it were to bleed through.  Advised him to closely monitor the wound and if any signs of infection he should follow up with [place] sooner than in 7 days to have the stitches removed.  Patient agrees to plan.   Final Clinical Impressions(s) / UC Diagnoses   Final diagnoses:  Long term current use of anticoagulant therapy  Laceration of left index finger without foreign body without damage to nail, initial encounter     Discharge Instructions      -I have put a couple of sutures in the finger since it was continuing to bleed due to your anticoagulant use.  The wound closure has been delayed 24 hours.  As we discussed, we do not only close without late but I did put a couple of sutures to hold  it together to help with the bleeding they will need to change her bandage at least daily, more often if you have any continued bleeding. - Continue to keep it clean. - Soap and water and keep it dry. - Continue your oral antibiotics for your sinus infection.  This should help to prevent infection but if you notice any increased swelling, drainage or redness or pain you need to be seen again at and evaluated for possible infection of the finger. - Have the sutures removed in a week.  May return here.     ED Prescriptions   None    PDMP not reviewed this encounter.   Danton Clap, PA-C 05/03/21 1158

## 2021-05-03 NOTE — Discharge Instructions (Addendum)
-  I have put a couple of sutures in the finger since it was continuing to bleed due to your anticoagulant use.  The wound closure has been delayed 24 hours.  As we discussed, we do not only close without late but I did put a couple of sutures to hold it together to help with the bleeding they will need to change her bandage at least daily, more often if you have any continued bleeding. - Continue to keep it clean. - Soap and water and keep it dry. - Continue your oral antibiotics for your sinus infection.  This should help to prevent infection but if you notice any increased swelling, drainage or redness or pain you need to be seen again at and evaluated for possible infection of the finger. - Have the sutures removed in a week.  May return here.

## 2021-05-03 NOTE — ED Triage Notes (Signed)
Pt cut himself while using power tools on 05/02/21. Wound is still bleeding. Pt is on blood thinners.

## 2021-05-06 ENCOUNTER — Encounter: Payer: Self-pay | Admitting: Family Medicine

## 2021-05-06 ENCOUNTER — Other Ambulatory Visit: Payer: Self-pay

## 2021-05-06 ENCOUNTER — Ambulatory Visit: Payer: Medicare PPO | Admitting: Family Medicine

## 2021-05-06 VITALS — BP 130/88 | HR 64 | Ht 68.0 in | Wt 173.0 lb

## 2021-05-06 DIAGNOSIS — E039 Hypothyroidism, unspecified: Secondary | ICD-10-CM | POA: Diagnosis not present

## 2021-05-06 DIAGNOSIS — I700 Atherosclerosis of aorta: Secondary | ICD-10-CM | POA: Diagnosis not present

## 2021-05-06 DIAGNOSIS — E782 Mixed hyperlipidemia: Secondary | ICD-10-CM | POA: Diagnosis not present

## 2021-05-06 DIAGNOSIS — I70219 Atherosclerosis of native arteries of extremities with intermittent claudication, unspecified extremity: Secondary | ICD-10-CM | POA: Diagnosis not present

## 2021-05-06 DIAGNOSIS — F419 Anxiety disorder, unspecified: Secondary | ICD-10-CM

## 2021-05-06 DIAGNOSIS — F3342 Major depressive disorder, recurrent, in full remission: Secondary | ICD-10-CM

## 2021-05-06 DIAGNOSIS — R0789 Other chest pain: Secondary | ICD-10-CM | POA: Diagnosis not present

## 2021-05-06 DIAGNOSIS — I6523 Occlusion and stenosis of bilateral carotid arteries: Secondary | ICD-10-CM | POA: Diagnosis not present

## 2021-05-06 DIAGNOSIS — I25118 Atherosclerotic heart disease of native coronary artery with other forms of angina pectoris: Secondary | ICD-10-CM | POA: Diagnosis not present

## 2021-05-06 DIAGNOSIS — E785 Hyperlipidemia, unspecified: Secondary | ICD-10-CM

## 2021-05-06 DIAGNOSIS — K219 Gastro-esophageal reflux disease without esophagitis: Secondary | ICD-10-CM

## 2021-05-06 DIAGNOSIS — F17200 Nicotine dependence, unspecified, uncomplicated: Secondary | ICD-10-CM | POA: Diagnosis not present

## 2021-05-06 DIAGNOSIS — R0602 Shortness of breath: Secondary | ICD-10-CM | POA: Diagnosis not present

## 2021-05-06 DIAGNOSIS — I10 Essential (primary) hypertension: Secondary | ICD-10-CM | POA: Diagnosis not present

## 2021-05-06 MED ORDER — PANTOPRAZOLE SODIUM 40 MG PO TBEC: 40.0000 mg | DELAYED_RELEASE_TABLET | Freq: Every day | ORAL | 1 refills | Status: DC

## 2021-05-06 MED ORDER — CETIRIZINE HCL 10 MG PO TABS: 10.0000 mg | ORAL_TABLET | Freq: Every day | ORAL | 1 refills | Status: DC

## 2021-05-06 MED ORDER — CLOPIDOGREL BISULFATE 75 MG PO TABS: 75.0000 mg | ORAL_TABLET | Freq: Every day | ORAL | 1 refills | Status: DC

## 2021-05-06 MED ORDER — LEVOTHYROXINE SODIUM 125 MCG PO TABS: 125.0000 ug | ORAL_TABLET | Freq: Every day | ORAL | 1 refills | Status: DC

## 2021-05-06 MED ORDER — FLUTICASONE PROPIONATE 50 MCG/ACT NA SUSP: NASAL | 1 refills | Status: DC

## 2021-05-06 MED ORDER — SERTRALINE HCL 100 MG PO TABS: 100.0000 mg | ORAL_TABLET | Freq: Every day | ORAL | 1 refills | Status: DC

## 2021-05-06 NOTE — Progress Notes (Signed)
Date:  05/06/2021   Name:  Joel Flores   DOB:  1950-07-08   MRN:  [id number]   Chief Complaint: Depression, Hypothyroidism, Allergic Rhinitis , and Gastroesophageal Reflux  Depression        The current episode started more than 1 year ago.   The onset quality is sudden.   The problem occurs intermittently.  Associated symptoms include irritable and restlessness.  Associated symptoms include no decreased concentration, no fatigue, no helplessness, no hopelessness, does not have insomnia, no decreased interest, no appetite change, no body aches, no myalgias, no headaches, no indigestion, not sad and no suicidal ideas.     The symptoms are aggravated by nothing.  Past treatments include SSRIs - Selective serotonin reuptake inhibitors.  Compliance with treatment is good.  Previous treatment provided moderate relief.  Past medical history includes thyroid problem.   Gastroesophageal Reflux He complains of chest pain. He reports no abdominal pain, no belching, no choking, no coughing, no dysphagia, no early satiety, no globus sensation, no heartburn, no hoarse voice, no nausea, no sore throat, no stridor, no tooth decay, no water brash or no wheezing. This is a chronic problem. The current episode started more than 1 year ago. Nothing aggravates the symptoms. Pertinent negatives include no anemia, fatigue, melena, muscle weakness, orthopnea or weight loss. He has tried a PPI for the symptoms. The treatment provided moderate relief.  Thyroid Problem Presents for follow-up visit. Patient reports no anxiety, cold intolerance, constipation, depressed mood, diaphoresis, diarrhea, dry skin, fatigue, hair loss, heat intolerance, hoarse voice, leg swelling, nail problem, palpitations, tremors, visual change, weight gain or weight loss. The symptoms have been improving.   Lab Results  Component Value Date   NA 140 05/20/2020   K 3.9 05/20/2020   CO2 23 05/20/2020   GLUCOSE 107 (H) 05/20/2020   BUN 11  05/20/2020   CREATININE 0.77 05/20/2020   CALCIUM 9.6 05/20/2020   GFRNONAA 92 05/20/2020   Lab Results  Component Value Date   CHOL 145 05/20/2020   HDL 37 (L) 05/20/2020   LDLCALC 81 05/20/2020   TRIG 156 (H) 05/20/2020   CHOLHDL 3.7 06/17/2018   Lab Results  Component Value Date   TSH 3.150 07/11/2020   No results found for: HGBA1C Lab Results  Component Value Date   WBC 8.5 11/11/2018   HGB 15.1 11/11/2018   HCT 44.1 11/11/2018   MCV 86 11/11/2018   PLT 322 11/11/2018   Lab Results  Component Value Date   ALT 19 05/20/2020   AST 24 05/20/2020   ALKPHOS 98 05/20/2020   BILITOT 0.4 05/20/2020   No results found for: 25OHVITD2, 25OHVITD3, VD25OH   Review of Systems  Constitutional:  Negative for appetite change, diaphoresis, fatigue, weight gain and weight loss.  HENT:  Negative for hoarse voice and sore throat.   Respiratory:  Negative for cough, choking and wheezing.   Cardiovascular:  Positive for chest pain. Negative for palpitations.  Gastrointestinal:  Negative for abdominal pain, constipation, diarrhea, dysphagia, heartburn, melena and nausea.  Endocrine: Negative for cold intolerance and heat intolerance.  Musculoskeletal:  Negative for myalgias and muscle weakness.  Neurological:  Negative for tremors and headaches.  Psychiatric/Behavioral:  Positive for depression. Negative for decreased concentration and suicidal ideas. The patient is not nervous/anxious and does not have insomnia.    Patient Active Problem List   Diagnosis Date Noted   Bilateral carotid artery stenosis 09/20/2019   Colon cancer screening  Polyp of sigmoid colon    SOBOE (shortness of breath on exertion) 07/30/2017   PVD (peripheral vascular disease) ([place]) 11/17/2016   Tobacco use disorder 11/17/2016   Reactive airway disease, mild intermittent, uncomplicated 09/98/3382   Allergic rhinitis due to pollen 08/13/2015   Nocturia 08/13/2015   Atherosclerosis of aorta ([place]) 02/12/2015    Essential hypertension 02/12/2015   Esophageal reflux 02/12/2015   Depression 02/12/2015   Adult hypothyroidism 02/12/2015   Chronic anxiety 02/12/2015   Hyperlipemia 02/12/2015    Allergies  Allergen Reactions   Sulfa Antibiotics Rash    Past Surgical History:  Procedure Laterality Date   CATARACT EXTRACTION W/PHACO Right 03/16/2018   Procedure: CATARACT EXTRACTION PHACO AND INTRAOCULAR LENS PLACEMENT ([place]);  Surgeon: Marchia Meiers, MD;  Location: [facility];  Service: Ophthalmology;  Laterality: Right;  [place]  01:05 CDE 11.85 Fluid pack lot # [id number]   CATARACT EXTRACTION W/PHACO Left 05/08/2020   Procedure: CATARACT EXTRACTION PHACO AND INTRAOCULAR LENS PLACEMENT ([facility]) LEFT 7.32 00:54.7 13.4%;  Surgeon: Leandrew Koyanagi, MD;  Location: [place];  Service: Ophthalmology;  Laterality: Left;   CHOLECYSTECTOMY     COLONOSCOPY  2015   Dr Allen Norris- cleared for 5 years    COLONOSCOPY WITH PROPOFOL N/A 01/17/2018   Procedure: COLONOSCOPY WITH PROPOFOL;  Surgeon: Lucilla Lame, MD;  Location: [place];  Service: Endoscopy;  Laterality: N/A;  requests early   INSERTION OF MESH N/A 08/31/2017   Procedure: INSERTION OF MESH;  Surgeon: Vickie Epley, MD;  Location: [facility];  Service: General;  Laterality: N/A;   POLYPECTOMY  01/17/2018   Procedure: POLYPECTOMY;  Surgeon: Lucilla Lame, MD;  Location: [place];  Service: Endoscopy;;   UMBILICAL HERNIA REPAIR N/A 08/31/2017   Procedure: HERNIA REPAIR UMBILICAL ADULT;  Surgeon: Vickie Epley, MD;  Location: [facility];  Service: General;  Laterality: N/A;   VEIN SURGERY     2 stents in R) leg    Social History   Tobacco Use   Smoking status: Every Day    Packs/day: 1.50    Years: 50.00    Pack years: 75.00    Types: Cigarettes   Smokeless tobacco: Never   Tobacco comments:    pt wants to try patches again but states they are too costly  Vaping Use   Vaping Use: Never used  Substance Use Topics    Alcohol use: Not Currently    Alcohol/week: 1.0 standard drink    Types: 1 Cans of beer per week    Comment: once a month   Drug use: No     Medication list has been reviewed and updated.  Current Meds  Medication Sig   acetaminophen (TYLENOL) 325 MG tablet Take 325 mg by mouth daily as needed for moderate pain or headache.   amoxicillin (AMOXIL) 500 MG capsule Take 1 capsule (500 mg total) by mouth 3 (three) times daily for 10 days.   azelastine (ASTELIN) 0.1 % nasal spray Place 2 sprays into both nostrils 2 (two) times daily. Use in each nostril as directed   cetirizine (ZYRTEC) 10 MG tablet Take 1 tablet (10 mg total) by mouth daily.   clopidogrel (PLAVIX) 75 MG tablet Take 1 tablet (75 mg total) by mouth daily.   dexlansoprazole (DEXILANT) 60 MG capsule Take 1 capsule (60 mg total) by mouth daily.   fenofibrate micronized (LOFIBRA) 134 MG capsule Take 1 capsule by mouth daily with breakfast. Nehemiah Massed   fluticasone (FLONASE) 50 MCG/ACT nasal spray SPRAY  2 SPRAYS INTO EACH NOSTRIL EVERY DAY   guaiFENesin-codeine (ROBITUSSIN AC) 100-10 MG/5ML syrup Take 5 mLs by mouth 3 (three) times daily as needed for cough.   ibuprofen (ADVIL,MOTRIN) 200 MG tablet Take 400 mg by mouth every 8 (eight) hours as needed (for pain).   levothyroxine (SYNTHROID) 125 MCG tablet Take 1 tablet (125 mcg total) by mouth daily.   losartan (COZAAR) 100 MG tablet Take 100 mg by mouth daily.   rosuvastatin (CRESTOR) 40 MG tablet Take 1 tablet by mouth daily. K[place]ki   sertraline (ZOLOFT) 100 MG tablet Take 1 tablet (100 mg total) by mouth daily.   [DISCONTINUED] clonazePAM (KLONOPIN) 0.5 MG tablet Take 0.5 tablets (0.25 mg total) by mouth daily. Take one half daily    PHQ 2/9 Scores 02/25/2021 11/13/2020 08/23/2020 07/31/2020  PHQ - 2 Score 0 0 0 0  PHQ- 9 Score 0 0 0 -    GAD 7 : Generalized Anxiety Score 02/25/2021 11/13/2020 08/23/2020 05/20/2020  Nervous, Anxious, on Edge 0 0 1 0  Control/stop worrying 0 0 0 0   Worry too much - different things 0 0 0 0  Trouble relaxing 0 0 0 0  Restless 0 0 0 0  Easily annoyed or irritable 0 0 0 0  Afraid - awful might happen 0 0 0 0  Total GAD 7 Score 0 0 1 0  Anxiety Difficulty - - Not difficult at all -    BP Readings from Last 3 Encounters:  05/06/21 130/88  05/03/21 (!) 153/92  04/29/21 134/80    Physical Exam Vitals and nursing note reviewed.  Constitutional:      General: He is irritable.  HENT:     Head: Normocephalic.     Right Ear: Tympanic membrane, ear canal and external ear normal. There is no impacted cerumen.     Left Ear: Tympanic membrane, ear canal and external ear normal. There is no impacted cerumen.     Nose: Nose normal. No congestion or rhinorrhea.     Mouth/Throat:     Mouth: Mucous membranes are moist.  Eyes:     General: No scleral icterus.       Right eye: No discharge.        Left eye: No discharge.     Conjunctiva/sclera: Conjunctivae normal.     Pupils: Pupils are equal, round, and reactive to light.  Neck:     Thyroid: No thyromegaly.     Vascular: No carotid bruit or JVD.     Trachea: No tracheal deviation.  Cardiovascular:     Rate and Rhythm: Normal rate and regular rhythm.     Heart sounds: Normal heart sounds. No murmur heard.   No friction rub. No gallop.  Pulmonary:     Effort: No respiratory distress.     Breath sounds: Normal breath sounds. No wheezing, rhonchi or rales.  Abdominal:     General: Bowel sounds are normal.     Palpations: Abdomen is soft. There is no mass.     Tenderness: There is no abdominal tenderness. There is no guarding or rebound.  Musculoskeletal:        General: No tenderness. Normal range of motion.     Cervical back: Normal range of motion and neck supple.  Lymphadenopathy:     Cervical: No cervical adenopathy.  Skin:    General: Skin is warm.     Findings: No rash.  Neurological:     Mental Status: He is alert and oriented  to person, place, and time.     Cranial  Nerves: No cranial nerve deficit.     Deep Tendon Reflexes: Reflexes are normal and symmetric.    Wt Readings from Last 3 Encounters:  05/06/21 173 lb (78.5 kg)  05/03/21 172 lb (78 kg)  04/29/21 174 lb (78.9 kg)    BP 130/88    Pulse 64    Ht 5\' 8"  (1.727 m)    Wt 173 lb (78.5 kg)    BMI 26.30 kg/m   Assessment and Plan:  1. Adult hypothyroidism Chronic.  Controlled.  Stable.  We will check TSH and if appropriate will continue levothyroxine 125 mcg daily. - levothyroxine (SYNTHROID) 125 MCG tablet; Take 1 tablet (125 mcg total) by mouth daily.  Dispense: 90 tablet; Refill: 1 - Thyroid Panel With TSH  2. Chronic anxiety Chronic.  Controlled.  Stable.  Patient has a gad score that is in the 6 range.  We will continue sertraline 100 mg once a day. - sertraline (ZOLOFT) 100 MG tablet; Take 1 tablet (100 mg total) by mouth daily.  Dispense: 90 tablet; Refill: 1  3. Gastroesophageal reflux disease, unspecified whether esophagitis present Chronic.  Controlled.  Stable.  Insurance has refused to continue to pay for Dexilant and we will switch over to pantoprazole 40 mg once a day. - pantoprazole (PROTONIX) 40 MG tablet; Take 1 tablet (40 mg total) by mouth daily.  Dispense: 90 tablet; Refill: 1  4. Recurrent major depressive disorder, in full remission ([place]) Chronic.  Controlled.  Stable.  Continue sertraline 100 mg once a day. - sertraline (ZOLOFT) 100 MG tablet; Take 1 tablet (100 mg total) by mouth daily.  Dispense: 90 tablet; Refill: 1  5. Atherosclerosis of aorta [facility]) Patient is followed by Dr. Jose Persia for coronary artery disease for which I prescribed Plavix 75 mg once a day.  Patient also is on medications for cholesterol/rosuvastatin and hypertension/losartan. - clopidogrel (PLAVIX) 75 MG tablet; Take 1 tablet (75 mg total) by mouth daily.  Dispense: 90 tablet; Refill: 1  6. Essential hypertension Chronic.  Controlled.  Stable.  Blood pressure is 130/88.  Will check renal  function panel for GFR and electrolyte concerns. - Renal Function Panel  7. Hyperlipidemia, unspecified hyperlipidemia type Chronic.  Controlled.  Stable.  Patient is currently taking rosuvastatin per cardiology.  We will check lipid panel for current level of Control with LDL. - Lipid Panel With LDL/HDL Ratio

## 2021-05-07 LAB — RENAL FUNCTION PANEL
Albumin: 4.4 g/dL (ref 3.7–4.7)
BUN/Creatinine Ratio: 20 (ref 10–24)
BUN: 17 mg/dL (ref 8–27)
CO2: 21 mmol/L (ref 20–29)
Calcium: 9.6 mg/dL (ref 8.6–10.2)
Chloride: 104 mmol/L (ref 96–106)
Creatinine, Ser: 0.83 mg/dL (ref 0.76–1.27)
Glucose: 125 mg/dL — ABNORMAL HIGH (ref 70–99)
Phosphorus: 3.3 mg/dL (ref 2.8–4.1)
Potassium: 4.3 mmol/L (ref 3.5–5.2)
Sodium: 143 mmol/L (ref 134–144)
eGFR: 94 mL/min/{1.73_m2} (ref >59)

## 2021-05-07 LAB — LIPID PANEL WITH LDL/HDL RATIO
Cholesterol, Total: 113 mg/dL (ref 100–199)
HDL: 34 mg/dL — ABNORMAL LOW (ref >39)
LDL Chol Calc (NIH): 57 mg/dL (ref 0–99)
LDL/HDL Ratio: 1.7 ratio (ref 0.0–3.6)
Triglycerides: 124 mg/dL (ref 0–149)
VLDL Cholesterol Cal: 22 mg/dL (ref 5–40)

## 2021-05-07 LAB — THYROID PANEL WITH TSH
Free Thyroxine Index: 2.4 (ref 1.2–4.9)
T3 Uptake Ratio: 28 % (ref 24–39)
T4, Total: 8.6 ug/dL (ref 4.5–12.0)
TSH: 1.52 u[IU]/mL (ref 0.450–4.500)

## 2021-05-11 ENCOUNTER — Encounter: Payer: Self-pay | Admitting: Emergency Medicine

## 2021-05-11 ENCOUNTER — Other Ambulatory Visit: Payer: Self-pay

## 2021-05-11 ENCOUNTER — Ambulatory Visit
Admission: EM | Admit: 2021-05-11 | Discharge: 2021-05-11 | Disposition: A | Discharge status: Home / Self Care | Payer: Medicare PPO | Attending: Emergency Medicine | Admitting: Emergency Medicine

## 2021-05-11 DIAGNOSIS — Z4802 Encounter for removal of sutures: Secondary | ICD-10-CM

## 2021-05-11 NOTE — ED Triage Notes (Addendum)
Patient here for suture removal to his left 2nd finger.  Patient denies any problems. Patient states that he finished his antibiotics.  Patient informed his BP reading was elevated today.  Patient states that he does not want to be seen today for his BP and just wants his sutures removed today.  Patient states that he will monitor his BP at home.

## 2021-05-15 DIAGNOSIS — J310 Chronic rhinitis: Secondary | ICD-10-CM | POA: Diagnosis not present

## 2021-05-15 DIAGNOSIS — R918 Other nonspecific abnormal finding of lung field: Secondary | ICD-10-CM | POA: Diagnosis not present

## 2021-05-15 DIAGNOSIS — I700 Atherosclerosis of aorta: Secondary | ICD-10-CM | POA: Diagnosis not present

## 2021-05-15 DIAGNOSIS — J449 Chronic obstructive pulmonary disease, unspecified: Secondary | ICD-10-CM | POA: Diagnosis not present

## 2021-05-15 DIAGNOSIS — F17218 Nicotine dependence, cigarettes, with other nicotine-induced disorders: Secondary | ICD-10-CM | POA: Diagnosis not present

## 2021-05-15 DIAGNOSIS — R059 Cough, unspecified: Secondary | ICD-10-CM | POA: Diagnosis not present

## 2021-05-15 DIAGNOSIS — R053 Chronic cough: Secondary | ICD-10-CM | POA: Diagnosis not present

## 2021-05-16 ENCOUNTER — Ambulatory Visit: Payer: Medicare PPO | Admitting: Family Medicine

## 2021-05-26 DIAGNOSIS — J310 Chronic rhinitis: Secondary | ICD-10-CM | POA: Diagnosis not present

## 2021-05-26 DIAGNOSIS — R053 Chronic cough: Secondary | ICD-10-CM | POA: Diagnosis not present

## 2021-05-26 DIAGNOSIS — J449 Chronic obstructive pulmonary disease, unspecified: Secondary | ICD-10-CM | POA: Diagnosis not present

## 2021-05-30 ENCOUNTER — Other Ambulatory Visit: Payer: Self-pay | Admitting: Family Medicine

## 2021-05-30 DIAGNOSIS — K219 Gastro-esophageal reflux disease without esophagitis: Secondary | ICD-10-CM

## 2021-05-30 NOTE — Telephone Encounter (Signed)
Requested medications are due for refill today.  unsure  Requested medications are on the active medications list.  yes  Last refill. 11/13/2020  Future visit scheduled.   yes  Notes to clinic.  Per note from OV of 05/06/2021, insurance will no longer pay for this medication and pt was switched to Protonix.    Requested Prescriptions  Pending Prescriptions Disp Refills   DEXILANT 60 MG capsule [Pharmacy Med Name: [place] DR 60 MG CAPSULE] 90 capsule 1    Sig: TAKE ONE CAPSULE BY MOUTH DAILY.     Gastroenterology: Proton Pump Inhibitors Passed - 05/30/2021  1:55 AM      Passed - Valid encounter within last 12 months    Recent Outpatient Visits           3 weeks ago Adult hypothyroidism   [facility] Juline Patch, MD   1 month ago Acute maxillary sinusitis, recurrence not specified   [facility] Juline Patch, MD   3 months ago Acute maxillary sinusitis, recurrence not specified   [facility] Juline Patch, MD   6 months ago Chronic anxiety   [facility] Juline Patch, MD   9 months ago Acute maxillary sinusitis, recurrence not specified   [facility] Juline Patch, MD       Future Appointments             In 5 months Juline Patch, MD [facility], [facility]

## 2021-06-03 DIAGNOSIS — F17200 Nicotine dependence, unspecified, uncomplicated: Secondary | ICD-10-CM | POA: Diagnosis not present

## 2021-06-03 DIAGNOSIS — I6523 Occlusion and stenosis of bilateral carotid arteries: Secondary | ICD-10-CM | POA: Diagnosis not present

## 2021-06-03 DIAGNOSIS — I25118 Atherosclerotic heart disease of native coronary artery with other forms of angina pectoris: Secondary | ICD-10-CM | POA: Diagnosis not present

## 2021-06-03 DIAGNOSIS — E782 Mixed hyperlipidemia: Secondary | ICD-10-CM | POA: Diagnosis not present

## 2021-06-03 DIAGNOSIS — I70219 Atherosclerosis of native arteries of extremities with intermittent claudication, unspecified extremity: Secondary | ICD-10-CM | POA: Diagnosis not present

## 2021-06-03 DIAGNOSIS — I10 Essential (primary) hypertension: Secondary | ICD-10-CM | POA: Diagnosis not present

## 2021-06-12 ENCOUNTER — Ambulatory Visit (INDEPENDENT_AMBULATORY_CARE_PROVIDER_SITE_OTHER): Payer: Medicare PPO | Admitting: Family Medicine

## 2021-06-12 ENCOUNTER — Encounter: Payer: Self-pay | Admitting: Family Medicine

## 2021-06-12 ENCOUNTER — Other Ambulatory Visit: Payer: Self-pay

## 2021-06-12 ENCOUNTER — Ambulatory Visit: Payer: Self-pay

## 2021-06-12 DIAGNOSIS — U071 COVID-19: Secondary | ICD-10-CM | POA: Diagnosis not present

## 2021-06-12 MED ORDER — MOLNUPIRAVIR EUA 200MG CAPSULE: 4.0000 | ORAL_CAPSULE | Freq: Two times a day (BID) | ORAL | 0 refills | Status: AC

## 2021-06-12 NOTE — Telephone Encounter (Signed)
Pt tested positive for covid yesterday. (His wife has it too and coughing alot)  pt just getting over bronchitis has had ongoing cough associated w/ that, so he is not sureif he is having any "covid symptoms".  He said he doesn't know what a covid sx is.   Pt. Refuses triage. "Why isn't my doctor calling me back? Why are you calling? This is screwed up. Have her call me." Please advise pt.    Answer Assessment - Initial Assessment Questions 1. COVID-19 DIAGNOSIS: "Who made your COVID-19 diagnosis?" "Was it confirmed by a positive lab test or self-test?" If not diagnosed by a doctor (or NP/PA), ask "Are there lots of cases (community spread) where you live?" Note: See public health department website, if unsure.     Home test 2. COVID-19 EXPOSURE: "Was there any known exposure to COVID before the symptoms began?" CDC Definition of close contact: within 6 feet (2 meters) for a total of 15 minutes or more over a 24-hour period.      Yes 3. ONSET: "When did the COVID-19 symptoms start?"      Today 4. WORST SYMPTOM: "What is your worst symptom?" (e.g., cough, fever, shortness of breath, muscle aches)     Cough 5. COUGH: "Do you have a cough?" If Yes, ask: "How bad is the cough?"       Yes 6. FEVER: "Do you have a fever?" If Yes, ask: "What is your temperature, how was it measured, and when did it start?"     99 7. RESPIRATORY STATUS: "Describe your breathing?" (e.g., shortness of breath, wheezing, unable to speak)      Refuses to answer questions 8. BETTER-SAME-WORSE: "Are you getting better, staying the same or getting worse compared to yesterday?"  If getting worse, ask, "In what way?"     N/a 9. HIGH RISK DISEASE: "Do you have any chronic medical problems?" (e.g., asthma, heart or lung disease, weak immune system, obesity, etc.)     N/a 10. VACCINE: "Have you had the COVID-19 vaccine?" If Yes, ask: "Which one, how many shots, when did you get it?"       N/a 11. BOOSTER: "Have you received  your COVID-19 booster?" If Yes, ask: "Which one and when did you get it?"       N/a 12. PREGNANCY: "Is there any chance you are pregnant?" "When was your last menstrual period?"       N/a 13. OTHER SYMPTOMS: "Do you have any other symptoms?"  (e.g., chills, fatigue, headache, loss of smell or taste, muscle pain, sore throat)       N/a 14. O2 SATURATION MONITOR:  "Do you use an oxygen saturation monitor (pulse oximeter) at home?" If Yes, ask "What is your reading (oxygen level) today?" "What is your usual oxygen saturation reading?" (e.g., 95%)       N/a  Protocols used: Coronavirus (COVID-19) Diagnosed or Suspected-A-AH

## 2021-06-12 NOTE — Progress Notes (Signed)
Date:  06/12/2021   Name:  Joel Flores   DOB:  1950/09/04   MRN:  [id number]   Chief Complaint: Covid Positive (Tested positive yesterday. Wife tested positive on Tuesday. Finished ZPack and Pred. On 06/05/21 for bronchitis.)  I [employer] [place] decision from my office connected with this patient, Vanessa Alesi. [place], by telephone at the patient's home.  I verified that I am speaking with the correct person using two identifiers. This visit was conducted via telephone due to the Covid-19 outbreak from my office at [facility] in [place], [place]. I discussed the limitations, risks, security and privacy concerns of performing an evaluation and management service by telephone. I also discussed with the patient that there may be a patient responsible charge related to this service. The patient expressed understanding and agreed to proceed.     Lab Results  Component Value Date   NA 143 05/06/2021   K 4.3 05/06/2021   CO2 21 05/06/2021   GLUCOSE 125 (H) 05/06/2021   BUN 17 05/06/2021   CREATININE 0.83 05/06/2021   CALCIUM 9.6 05/06/2021   EGFR 94 05/06/2021   GFRNONAA 92 05/20/2020   Lab Results  Component Value Date   CHOL 113 05/06/2021   HDL 34 (L) 05/06/2021   LDLCALC 57 05/06/2021   TRIG 124 05/06/2021   CHOLHDL 3.7 06/17/2018   Lab Results  Component Value Date   TSH 1.520 05/06/2021   No results found for: HGBA1C Lab Results  Component Value Date   WBC 8.5 11/11/2018   HGB 15.1 11/11/2018   HCT 44.1 11/11/2018   MCV 86 11/11/2018   PLT 322 11/11/2018   Lab Results  Component Value Date   ALT 19 05/20/2020   AST 24 05/20/2020   ALKPHOS 98 05/20/2020   BILITOT 0.4 05/20/2020   No results found for: 25OHVITD2, 25OHVITD3, VD25OH   Review of Systems  Constitutional:  Negative for chills and fever.  HENT:  Negative for drooling, ear discharge, ear pain and sore throat.   Respiratory:  Negative for cough, shortness of breath and wheezing.   Cardiovascular:  Negative  for chest pain, palpitations and leg swelling.  Gastrointestinal:  Negative for abdominal pain, blood in stool, constipation, diarrhea and nausea.  Endocrine: Negative for polydipsia.  Genitourinary:  Negative for dysuria, frequency, hematuria and urgency.  Musculoskeletal:  Negative for back pain, myalgias and neck pain.  Skin:  Negative for rash.  Allergic/Immunologic: Negative for environmental allergies.  Neurological:  Negative for dizziness and headaches.  Hematological:  Does not bruise/bleed easily.  Psychiatric/Behavioral:  Negative for suicidal ideas. The patient is not nervous/anxious.    Patient Active Problem List   Diagnosis Date Noted   Bilateral carotid artery stenosis 09/20/2019   Colon cancer screening    Polyp of sigmoid colon    SOBOE (shortness of breath on exertion) 07/30/2017   PVD (peripheral vascular disease) ([place]) 11/17/2016   Tobacco use disorder 11/17/2016   Reactive airway disease, mild intermittent, uncomplicated 96/88/6484   Allergic rhinitis due to pollen 08/13/2015   Nocturia 08/13/2015   Atherosclerosis of aorta ([place]) 02/12/2015   Essential hypertension 02/12/2015   Esophageal reflux 02/12/2015   Depression 02/12/2015   Adult hypothyroidism 02/12/2015   Chronic anxiety 02/12/2015   Hyperlipemia 02/12/2015    Allergies  Allergen Reactions   Sulfa Antibiotics Rash    Past Surgical History:  Procedure Laterality Date   CATARACT EXTRACTION W/PHACO Right 03/16/2018   Procedure: CATARACT EXTRACTION PHACO AND INTRAOCULAR  LENS PLACEMENT (IOC);  Surgeon: Marchia Meiers, MD;  Location: [facility];  Service: Ophthalmology;  Laterality: Right;  [place]  01:05 CDE 11.85 Fluid pack lot # [id number]   CATARACT EXTRACTION W/PHACO Left 05/08/2020   Procedure: CATARACT EXTRACTION PHACO AND INTRAOCULAR LENS PLACEMENT (IOC) LEFT 7.32 00:54.7 13.4%;  Surgeon: Leandrew Koyanagi, MD;  Location: [place];  Service: Ophthalmology;  Laterality: Left;    CHOLECYSTECTOMY     COLONOSCOPY  2015   Dr Allen Norris- cleared for 5 years    COLONOSCOPY WITH PROPOFOL N/A 01/17/2018   Procedure: COLONOSCOPY WITH PROPOFOL;  Surgeon: Lucilla Lame, MD;  Location: [place];  Service: Endoscopy;  Laterality: N/A;  requests early   INSERTION OF MESH N/A 08/31/2017   Procedure: INSERTION OF MESH;  Surgeon: Vickie Epley, MD;  Location: [facility];  Service: General;  Laterality: N/A;   POLYPECTOMY  01/17/2018   Procedure: POLYPECTOMY;  Surgeon: Lucilla Lame, MD;  Location: [place];  Service: Endoscopy;;   UMBILICAL HERNIA REPAIR N/A 08/31/2017   Procedure: HERNIA REPAIR UMBILICAL ADULT;  Surgeon: Vickie Epley, MD;  Location: [facility];  Service: General;  Laterality: N/A;   VEIN SURGERY     2 stents in R) leg    Social History   Tobacco Use   Smoking status: Every Day    Packs/day: 1.50    Years: 50.00    Pack years: 75.00    Types: Cigarettes   Smokeless tobacco: Never   Tobacco comments:    pt wants to try patches again but states they are too costly  Vaping Use   Vaping Use: Never used  Substance Use Topics   Alcohol use: Not Currently    Alcohol/week: 1.0 standard drink    Types: 1 Cans of beer per week    Comment: once a month   Drug use: No     Medication list has been reviewed and updated.  Current Meds  Medication Sig   acetaminophen (TYLENOL) 325 MG tablet Take 325 mg by mouth daily as needed for moderate pain or headache.   azelastine (ASTELIN) 0.1 % nasal spray Place 2 sprays into both nostrils 2 (two) times daily. Use in each nostril as directed   cetirizine (ZYRTEC) 10 MG tablet Take 1 tablet (10 mg total) by mouth daily.   clopidogrel (PLAVIX) 75 MG tablet Take 1 tablet (75 mg total) by mouth daily.   DEXILANT 60 MG capsule TAKE ONE CAPSULE BY MOUTH DAILY.   fenofibrate micronized (LOFIBRA) 134 MG capsule Take 1 capsule by mouth daily with breakfast. Nehemiah Massed   fluticasone (FLONASE) 50 MCG/ACT nasal spray  SPRAY 2 SPRAYS INTO EACH NOSTRIL EVERY DAY   ibuprofen (ADVIL,MOTRIN) 200 MG tablet Take 400 mg by mouth every 8 (eight) hours as needed (for pain).   levothyroxine (SYNTHROID) 125 MCG tablet Take 1 tablet (125 mcg total) by mouth daily.   losartan (COZAAR) 100 MG tablet Take 100 mg by mouth daily.   pantoprazole (PROTONIX) 40 MG tablet Take 1 tablet (40 mg total) by mouth daily.   rosuvastatin (CRESTOR) 40 MG tablet Take 1 tablet by mouth daily. [place]   sertraline (ZOLOFT) 100 MG tablet Take 1 tablet (100 mg total) by mouth daily.   [DISCONTINUED] guaiFENesin-codeine (ROBITUSSIN AC) 100-10 MG/5ML syrup Take 5 mLs by mouth 3 (three) times daily as needed for cough.    PHQ 2/9 Scores 02/25/2021 11/13/2020 08/23/2020 07/31/2020  PHQ - 2 Score 0 0 0 0  PHQ- 9 Score  0 0 0 -    GAD 7 : Generalized Anxiety Score 02/25/2021 11/13/2020 08/23/2020 05/20/2020  Nervous, Anxious, on Edge 0 0 1 0  Control/stop worrying 0 0 0 0  Worry too much - different things 0 0 0 0  Trouble relaxing 0 0 0 0  Restless 0 0 0 0  Easily annoyed or irritable 0 0 0 0  Afraid - awful might happen 0 0 0 0  Total GAD 7 Score 0 0 1 0  Anxiety Difficulty - - Not difficult at all -    BP Readings from Last 3 Encounters:  05/11/21 (!) 132/115  05/06/21 130/88  05/03/21 (!) 153/92    Physical Exam Vitals and nursing note reviewed.  Neck:     Thyroid: No thyromegaly.     Vascular: No JVD.     Trachea: No tracheal deviation.  Neurological:     Mental Status: He is alert.     Deep Tendon Reflexes: Reflexes are normal and symmetric.    Wt Readings from Last 3 Encounters:  05/11/21 173 lb 1 oz (78.5 kg)  05/06/21 173 lb (78.5 kg)  05/03/21 172 lb (78 kg)    There were no vitals taken for this visit.  Assessment and Plan:  1. COVID New onset.  Persistent.  Relatively stable.  Patient has multiple risk factors and is on multiple medications.  His GLOVF-64 risk of complications is a 5 which puts him in the moderate  range.  Patient is asymptomatic currently but did test positive this morning and as of today when needs to be isolated at home with wearing a mask from 5 days thereafter for the next 5 days.  Initiation of molnupiravir.  200 mg 4 capsules twice a day for 5 days.  Of also suggested that patient obtain Mucinex DM if further cough should develop in for is mucolytic action.. - molnupiravir EUA (LAGEVRIO) 200 mg CAPS capsule; Take 4 capsules (800 mg total) by mouth 2 (two) times daily for 5 days.  Dispense: 40 capsule; Refill: 0   I spent 10 minutes with this patient, More than 50% of that time was spent in face to face education, counseling and care coordination.

## 2021-06-12 NOTE — Patient Instructions (Signed)

## 2021-06-30 DIAGNOSIS — F17210 Nicotine dependence, cigarettes, uncomplicated: Secondary | ICD-10-CM | POA: Diagnosis not present

## 2021-06-30 DIAGNOSIS — J310 Chronic rhinitis: Secondary | ICD-10-CM | POA: Diagnosis not present

## 2021-06-30 DIAGNOSIS — J449 Chronic obstructive pulmonary disease, unspecified: Secondary | ICD-10-CM | POA: Diagnosis not present

## 2021-06-30 DIAGNOSIS — R053 Chronic cough: Secondary | ICD-10-CM | POA: Diagnosis not present

## 2021-06-30 DIAGNOSIS — R918 Other nonspecific abnormal finding of lung field: Secondary | ICD-10-CM | POA: Diagnosis not present

## 2021-07-16 ENCOUNTER — Other Ambulatory Visit: Payer: Self-pay | Admitting: *Deleted

## 2021-07-16 DIAGNOSIS — Z87891 Personal history of nicotine dependence: Secondary | ICD-10-CM

## 2021-07-16 DIAGNOSIS — F17210 Nicotine dependence, cigarettes, uncomplicated: Secondary | ICD-10-CM

## 2021-07-21 ENCOUNTER — Other Ambulatory Visit (INDEPENDENT_AMBULATORY_CARE_PROVIDER_SITE_OTHER): Payer: Self-pay | Admitting: Vascular Surgery

## 2021-07-21 DIAGNOSIS — I6523 Occlusion and stenosis of bilateral carotid arteries: Secondary | ICD-10-CM

## 2021-07-21 DIAGNOSIS — Z9889 Other specified postprocedural states: Secondary | ICD-10-CM

## 2021-07-22 ENCOUNTER — Ambulatory Visit (INDEPENDENT_AMBULATORY_CARE_PROVIDER_SITE_OTHER): Payer: Medicare PPO

## 2021-07-22 ENCOUNTER — Ambulatory Visit (INDEPENDENT_AMBULATORY_CARE_PROVIDER_SITE_OTHER): Payer: Medicare PPO | Admitting: Vascular Surgery

## 2021-07-22 ENCOUNTER — Other Ambulatory Visit: Payer: Self-pay

## 2021-07-22 DIAGNOSIS — I739 Peripheral vascular disease, unspecified: Secondary | ICD-10-CM | POA: Diagnosis not present

## 2021-07-22 DIAGNOSIS — I6523 Occlusion and stenosis of bilateral carotid arteries: Secondary | ICD-10-CM

## 2021-07-22 DIAGNOSIS — Z9889 Other specified postprocedural states: Secondary | ICD-10-CM

## 2021-07-28 ENCOUNTER — Encounter (INDEPENDENT_AMBULATORY_CARE_PROVIDER_SITE_OTHER): Payer: Self-pay | Admitting: *Deleted

## 2021-07-30 ENCOUNTER — Ambulatory Visit: Payer: Medicare PPO | Attending: Acute Care

## 2021-08-04 ENCOUNTER — Ambulatory Visit: Payer: Medicare PPO

## 2021-08-06 ENCOUNTER — Telehealth: Payer: Self-pay | Admitting: *Deleted

## 2021-08-06 NOTE — Telephone Encounter (Signed)
Left message for pt to call back to reschedule f/u lung screening CT scan.  ?

## 2021-08-13 ENCOUNTER — Telehealth: Payer: Self-pay | Admitting: *Deleted

## 2021-08-13 NOTE — Telephone Encounter (Signed)
Left message for patient to call back to schedule follow up lung cancer screening CT scan.  

## 2021-08-25 ENCOUNTER — Ambulatory Visit (INDEPENDENT_AMBULATORY_CARE_PROVIDER_SITE_OTHER): Payer: Medicare PPO

## 2021-08-25 VITALS — BP 152/88 | HR 100 | Resp 17 | Ht 68.0 in | Wt 171.0 lb

## 2021-08-25 DIAGNOSIS — Z0000 Encounter for general adult medical examination without abnormal findings: Secondary | ICD-10-CM

## 2021-08-25 NOTE — Progress Notes (Signed)
? ?Subjective:  ? Joel Flores is a 71 y.o. male who presents for Medicare Annual/Subsequent preventive examination. ? ?Review of Systems    ? ?Cardiac Risk Factors include: advanced age (>70mn, >>80women);dyslipidemia;male gender;hypertension;smoking/ tobacco exposure;sedentary lifestyle ? ?   ?Objective:  ?  ?Today's Vitals  ? 08/25/21 1020  ?BP: (!) 152/88  ?Pulse: 100  ?Resp: 17  ?SpO2: 95%  ?Weight: 171 lb (77.6 kg)  ?Height: '5\' 8"'$  (1.727 m)  ? ?Body mass index is 26 kg/m?. ? ? ?  08/25/2021  ? 10:32 AM 05/11/2021  ?  8:17 AM 05/03/2021  ? 10:58 AM 07/31/2020  ? 11:41 AM 05/08/2020  ? 10:41 AM 07/24/2019  ? 11:43 AM 06/01/2018  ?  9:03 AM  ?Advanced Directives  ?Does Patient Have a Medical Advance Directive? No No No No No No No  ?Would patient like information on creating a medical advance directive? Yes (MAU/Ambulatory/Procedural Areas - Information given)   No - Patient declined No - Patient declined Yes (MAU/Ambulatory/Procedural Areas - Information given) Yes (MAU/Ambulatory/Procedural Areas - Information given)  ? ? ?Current Medications (verified) ?Outpatient Encounter Medications as of 08/25/2021  ?Medication Sig  ? acetaminophen (TYLENOL) 325 MG tablet Take 325 mg by mouth daily as needed for moderate pain or headache.  ? albuterol (VENTOLIN HFA) 108 (90 Base) MCG/ACT inhaler Inhale into the lungs. Inhale 2 inhalations into the lungs 4 (four) times daily PRN  ? azelastine (ASTELIN) 0.1 % nasal spray Place 2 sprays into both nostrils 2 (two) times daily. Use in each nostril as directed  ? cetirizine (ZYRTEC) 10 MG tablet Take 1 tablet (10 mg total) by mouth daily.  ? clopidogrel (PLAVIX) 75 MG tablet Take 1 tablet (75 mg total) by mouth daily.  ? fluticasone (FLONASE) 50 MCG/ACT nasal spray SPRAY 2 SPRAYS INTO EACH NOSTRIL EVERY DAY  ? ibuprofen (ADVIL,MOTRIN) 200 MG tablet Take 400 mg by mouth every 8 (eight) hours as needed (for pain).  ? levothyroxine (SYNTHROID) 125 MCG tablet Take 1 tablet (125 mcg total) by  mouth daily.  ? losartan (COZAAR) 100 MG tablet Take 100 mg by mouth daily.  ? pantoprazole (PROTONIX) 40 MG tablet Take 1 tablet (40 mg total) by mouth daily.  ? rosuvastatin (CRESTOR) 40 MG tablet Take 1 tablet by mouth daily. KNehemiah Massed ? sertraline (ZOLOFT) 100 MG tablet Take 1 tablet (100 mg total) by mouth daily.  ? [DISCONTINUED] DEXILANT 60 MG capsule TAKE ONE CAPSULE BY MOUTH DAILY.  ? [DISCONTINUED] fenofibrate micronized (LOFIBRA) 134 MG capsule Take 1 capsule by mouth daily with breakfast. KNehemiah Massed ? ?No facility-administered encounter medications on file as of 08/25/2021.  ? ? ?Allergies (verified) ?Sulfa antibiotics  ? ?History: ?Past Medical History:  ?Diagnosis Date  ? Allergy 1990  ? Anxiety   ? PANIC ATTACKS  ? Arthritis   ? right shoulder  ? [place]  ? Cataract   ? [place]   ? Cough   ? Depression   ? Environmental and seasonal allergies   ? Essential hypertension 02/12/2015  ? Family history of adverse reaction to anesthesia   ? Father - 89- MI, then stroke after Prostate surgery  ? GERD (gastroesophageal reflux disease)   ? History of hiatal hernia   ? Hyperlipidemia   ? Hypertension   ? Hypothyroidism   ? Incisional hernia, without obstruction or gangrene   ? PVD [place]ipheral vascular disease) [place] 11/17/[place]  ? Thyroid disease   ?  Venous insufficiency   ? Vertigo   ? can happen daily  ? Wears hearing aid in both ears   ? has, does not wear  ? ?Past Surgical History:  ?Procedure Laterality Date  ? CATARACT EXTRACTION W/PHACO Right 03/16/2018  ? Procedure: CATARACT EXTRACTION PHACO AND INTRAOCULAR LENS PLACEMENT (IOC);  Surgeon: Marchia Meiers, MD;  Location: [facility];  Service: Ophthalmology;  Laterality: Right;  [place]  01:05 ?CDE 11.85 ?Fluid pack lot # [id number]  ? CATARACT EXTRACTION W/PHACO Left 05/08/2020  ? Procedure: CATARACT EXTRACTION PHACO AND INTRAOCULAR LENS PLACEMENT (IOC) LEFT 7.32 00:54.7 13.4%;  Surgeon:  Leandrew Koyanagi, MD;  Location: [place];  Service: Ophthalmology;  Laterality: Left;  ? CHOLECYSTECTOMY    ? COLONOSCOPY  2015  ? Dr Allen Norris- cleared for 5 years   ? COLONOSCOPY WITH PROPOFOL N/A 01/17/2018  ? Procedure: COLONOSCOPY WITH PROPOFOL;  Surgeon: Lucilla Lame, MD;  Location: [place];  Service: Endoscopy;  Laterality: N/A;  requests early  ? HERNIA REPAIR  July 2019  ? INSERTION OF MESH N/A 08/31/2017  ? Procedure: INSERTION OF MESH;  Surgeon: Vickie Epley, MD;  Location: [facility];  Service: General;  Laterality: N/A;  ? POLYPECTOMY  01/17/2018  ? Procedure: POLYPECTOMY;  Surgeon: Lucilla Lame, MD;  Location: [place];  Service: Endoscopy;;  ? UMBILICAL HERNIA REPAIR N/A 08/31/2017  ? Procedure: HERNIA REPAIR UMBILICAL ADULT;  Surgeon: Vickie Epley, MD;  Location: [facility];  Service: General;  Laterality: N/A;  ? VEIN SURGERY    ? 2 stents in R) leg  ? ?Family History  ?Problem Relation Age of Onset  ? Diabetes Mother   ? Cancer Father   ? Heart disease Father   ? Stroke Father   ? ?Social History  ? ?Socioeconomic History  ? Marital status: Married  ?  Spouse name: Not on file  ? Number of children: 3  ? Years of education: Not on file  ? Highest education level: 12th grade  ?Occupational History  ? Occupation: retired  ?Tobacco Use  ? Smoking status: Every Day  ?  Packs/day: 1.50  ?  Years: 50.00  ?  Pack years: 75.00  ?  Types: Cigarettes  ? Smokeless tobacco: Never  ?Vaping Use  ? Vaping Use: Never used  ?Substance and Sexual Activity  ? Alcohol use: Yes  ?  Comment: once a month  ? Drug use: No  ? Sexual activity: Not Currently  ?Other Topics Concern  ? Not on file  ?Social History Narrative  ? Not on file  ? ?Social Determinants of Health  ? ?Financial Resource Strain: Low Risk   ? Difficulty of Paying Living Expenses: Not hard at all  ?Food Insecurity: No Food Insecurity  ? Worried About Charity fundraiser in the Last Year: Never true  ? Ran Out of  Food in the Last Year: Never true  ?Transportation Needs: No Transportation Needs  ? Lack of Transportation (Medical): No  ? Lack of Transportation (Non-Medical): No  ?Physical Activity: Inactive  ? Days of Exercise per Week: 0 days  ? Minutes of Exercise per Session: 0 min  ?Stress: No Stress Concern Present  ? Feeling of Stress : Not at all  ?Social Connections: Moderately Isolated  ? Frequency of Communication with Friends and Family: More than three times a week  ? Frequency of Social Gatherings with Friends and Family: Once a week  ? Attends Religious Services: Never  ? Active Member of  Clubs or Organizations: No  ? Attends Archivist Meetings: Never  ? Marital Status: Married  ? ? ?Tobacco Counseling ?Ready to quit: Yes ?Counseling given: Not Answered ? ? ?Clinical Intake: ? ?Pre-visit preparation completed: Yes ? ?Pain : No/denies pain ? ?  ? ?BMI - recorded: 26 ?Nutritional Status: BMI 25 -29 Overweight ?Nutritional Risks: None ?Diabetes: No ? ?How often do you need to have someone help you when you read instructions, pamphlets, or other written materials from your doctor or pharmacy?: 1 - Never ? ? ? ?Interpreter Needed?: No ? ?Information entered by :: Clemetine Marker LPN ? ? ?Activities of Daily Living ? ?  08/25/2021  ? 10:34 AM  ?In your present state of health, do you have any difficulty performing the following activities:  ?Hearing? 1  ?Vision? 0  ?Difficulty concentrating or making decisions? 0  ?Walking or climbing stairs? 1  ?Dressing or bathing? 0  ?Doing errands, shopping? 0  ?Preparing Food and eating ? N  ?Using the Toilet? N  ?In the past six months, have you accidently leaked urine? N  ?Do you have problems with loss of bowel control? N  ?Managing your Medications? N  ?Managing your Finances? N  ?Housekeeping or managing your Housekeeping? N  ? ? ?Patient Care Team: ?Juline Patch, MD as PCP - General (Family Medicine) ? ?Indicate any recent Medical Services you may have received from  other than [facility] providers in the past year (date may be approximate). ? ?   ?Assessment:  ? This is a routine wellness examination for [employer]. ? ?Hearing/Vision screen ?Hearing Screening - Comments:: Hearing

## 2021-08-25 NOTE — Patient Instructions (Addendum)
Joel Flores , ?Thank you for taking time to come for your Medicare Wellness Visit. I appreciate your ongoing commitment to your health goals. Please review the following plan we discussed and let me know if I can assist you in the future.  ? ?Screening recommendations/referrals: ?Colonoscopy: done 01/17/18. Repeat 12/2022.  ?Recommended yearly ophthalmology/optometry visit for glaucoma screening and checkup ?Recommended yearly dental visit for hygiene and checkup ? ?Vaccinations: ?Influenza vaccine: due fall 2023 ?Pneumococcal vaccine: done 12/20/17 ?Tdap vaccine: done 11/02/16 ?Shingles vaccine: Shingrix discussed. Please contact your pharmacy for coverage information.    ?Covid-19: done 05/26/19, 06/20/19 & 02/19/20 ? ?Advanced directives: Advance directive discussed with you today. I have provided a copy for you to complete at home and have notarized. Once this is complete please bring a copy in to our office so we can scan it into your chart.  ? ?Conditions/risks identified: If you wish to quit smoking, help is available. For free tobacco cessation program offerings call the [facility] at [phone] or Live Well Line at [phone]. You may also visit [email or web address] or email [email or web address] for more information on other programs.  ? ?Please call [facility] to reschedule your lung cancer screening at [phone].  ? ?Next appointment: Follow up in one year for your annual wellness visit.  ? ?Preventive Care 2 Years and Older, Male ?Preventive care refers to lifestyle choices and visits with your health care provider that can promote health and wellness. ?What does preventive care include? ?A yearly physical exam. This is also called an annual well check. ?Dental exams once or twice a year. ?Routine eye exams. Ask your health care provider how often you should have your eyes checked. ?Personal lifestyle choices, including: ?Daily care of your teeth and gums. ?Regular physical  activity. ?Eating a healthy diet. ?Avoiding tobacco and drug use. ?Limiting alcohol use. ?Practicing safe sex. ?Taking low doses of aspirin every day. ?Taking vitamin and mineral supplements as recommended by your health care provider. ?What happens during an annual well check? ?The services and screenings done by your health care provider during your annual well check will depend on your age, overall health, lifestyle risk factors, and family history of disease. ?Counseling  ?Your health care provider may ask you questions about your: ?Alcohol use. ?Tobacco use. ?Drug use. ?Emotional well-being. ?Home and relationship well-being. ?Sexual activity. ?Eating habits. ?History of falls. ?Memory and ability to understand (cognition). ?Work and work Statistician. ?Screening  ?You may have the following tests or measurements: ?Height, weight, and BMI. ?Blood pressure. ?Lipid and cholesterol levels. These may be checked every 5 years, or more frequently if you are over 6 years old. ?Skin check. ?Lung cancer screening. You may have this screening every year starting at age 27 if you have a 30-pack-year history of smoking and currently smoke or have quit within the past 15 years. ?Fecal occult blood test (FOBT) of the stool. You may have this test every year starting at age 83. ?Flexible sigmoidoscopy or colonoscopy. You may have a sigmoidoscopy every 5 years or a colonoscopy every 10 years starting at age 10. ?Prostate cancer screening. Recommendations will vary depending on your family history and other risks. ?Hepatitis C blood test. ?Hepatitis B blood test. ?Sexually transmitted disease (STD) testing. ?Diabetes screening. This is done by checking your blood sugar (glucose) after you have not eaten for a while (fasting). You may have this done every 1-3 years. ?Abdominal aortic aneurysm (AAA) screening. You may need this if you are  a current or former smoker. ?Osteoporosis. You may be screened starting at age 61 if you are  at high risk. ?Talk with your health care provider about your test results, treatment options, and if necessary, the need for more tests. ?Vaccines  ?Your health care provider may recommend certain vaccines, such as: ?Influenza vaccine. This is recommended every year. ?Tetanus, diphtheria, and acellular pertussis (Tdap, Td) vaccine. You may need a Td booster every 10 years. ?Zoster vaccine. You may need this after age 4. ?Pneumococcal 13-valent conjugate (PCV13) vaccine. One dose is recommended after age 49. ?Pneumococcal polysaccharide (PPSV23) vaccine. One dose is recommended after age 64. ?Talk to your health care provider about which screenings and vaccines you need and how often you need them. ?This information is not intended to replace advice given to you by your health care provider. Make sure you discuss any questions you have with your health care provider. ?Document Released: 05/10/2015 Document Revised: 01/01/2016 Document Reviewed: 02/12/2015 ?Elsevier Interactive Patient Education ? 2017 [place]. ? ?Fall Prevention in the Home ?Falls can cause injuries. They can happen to people of all ages. There are many things you can do to make your home safe and to help prevent falls. ?What can I do on the outside of my home? ?Regularly fix the edges of walkways and driveways and fix any cracks. ?Remove anything that might make you trip as you walk through a door, such as a raised step or threshold. ?Trim any bushes or trees on the path to your home. ?Use bright outdoor lighting. ?Clear any walking paths of anything that might make someone trip, such as rocks or tools. ?Regularly check to see if handrails are loose or broken. Make sure that both sides of any steps have handrails. ?Any raised decks and porches should have guardrails on the edges. ?Have any leaves, snow, or ice cleared regularly. ?Use sand or salt on walking paths during winter. ?Clean up any spills in your garage right away. This includes oil  or grease spills. ?What can I do in the bathroom? ?Use night lights. ?Install grab bars by the toilet and in the tub and shower. Do not use towel bars as grab bars. ?Use non-skid mats or decals in the tub or shower. ?If you need to sit down in the shower, use a plastic, non-slip stool. ?Keep the floor dry. Clean up any water that spills on the floor as soon as it happens. ?Remove soap buildup in the tub or shower regularly. ?Attach bath mats securely with double-sided non-slip rug tape. ?Do not have throw rugs and other things on the floor that can make you trip. ?What can I do in the bedroom? ?Use night lights. ?Make sure that you have a light by your bed that is easy to reach. ?Do not use any sheets or blankets that are too big for your bed. They should not hang down onto the floor. ?Have a firm chair that has side arms. You can use this for support while you get dressed. ?Do not have throw rugs and other things on the floor that can make you trip. ?What can I do in the kitchen? ?Clean up any spills right away. ?Avoid walking on wet floors. ?Keep items that you use a lot in easy-to-reach places. ?If you need to reach something above you, use a strong step stool that has a grab bar. ?Keep electrical cords out of the way. ?Do not use floor polish or wax that makes floors slippery. If you must  use wax, use non-skid floor wax. ?Do not have throw rugs and other things on the floor that can make you trip. ?What can I do with my stairs? ?Do not leave any items on the stairs. ?Make sure that there are handrails on both sides of the stairs and use them. Fix handrails that are broken or loose. Make sure that handrails are as long as the stairways. ?Check any carpeting to make sure that it is firmly attached to the stairs. Fix any carpet that is loose or worn. ?Avoid having throw rugs at the top or bottom of the stairs. If you do have throw rugs, attach them to the floor with carpet tape. ?Make sure that you have a light  switch at the top of the stairs and the bottom of the stairs. If you do not have them, ask someone to add them for you. ?What else can I do to help prevent falls? ?Wear shoes that: ?Do not have high he

## 2021-09-04 DIAGNOSIS — I70219 Atherosclerosis of native arteries of extremities with intermittent claudication, unspecified extremity: Secondary | ICD-10-CM | POA: Diagnosis not present

## 2021-09-04 DIAGNOSIS — E782 Mixed hyperlipidemia: Secondary | ICD-10-CM | POA: Diagnosis not present

## 2021-09-04 DIAGNOSIS — F17200 Nicotine dependence, unspecified, uncomplicated: Secondary | ICD-10-CM | POA: Diagnosis not present

## 2021-09-04 DIAGNOSIS — I10 Essential (primary) hypertension: Secondary | ICD-10-CM | POA: Diagnosis not present

## 2021-09-04 DIAGNOSIS — I25118 Atherosclerotic heart disease of native coronary artery with other forms of angina pectoris: Secondary | ICD-10-CM | POA: Diagnosis not present

## 2021-09-04 DIAGNOSIS — I6523 Occlusion and stenosis of bilateral carotid arteries: Secondary | ICD-10-CM | POA: Diagnosis not present

## 2021-09-23 ENCOUNTER — Ambulatory Visit
Admission: RE | Admit: 2021-09-23 | Discharge: 2021-09-23 | Disposition: A | Discharge status: Home / Self Care | Payer: Medicare PPO | Source: Ambulatory Visit | Attending: Acute Care | Admitting: Acute Care

## 2021-09-23 DIAGNOSIS — Z87891 Personal history of nicotine dependence: Secondary | ICD-10-CM | POA: Insufficient documentation

## 2021-09-23 DIAGNOSIS — F17210 Nicotine dependence, cigarettes, uncomplicated: Secondary | ICD-10-CM | POA: Diagnosis not present

## 2021-09-24 ENCOUNTER — Other Ambulatory Visit: Payer: Self-pay

## 2021-09-24 DIAGNOSIS — Z87891 Personal history of nicotine dependence: Secondary | ICD-10-CM

## 2021-09-24 DIAGNOSIS — Z122 Encounter for screening for malignant neoplasm of respiratory organs: Secondary | ICD-10-CM

## 2021-09-24 DIAGNOSIS — F17210 Nicotine dependence, cigarettes, uncomplicated: Secondary | ICD-10-CM

## 2021-10-06 DIAGNOSIS — J449 Chronic obstructive pulmonary disease, unspecified: Secondary | ICD-10-CM | POA: Diagnosis not present

## 2021-10-06 DIAGNOSIS — R918 Other nonspecific abnormal finding of lung field: Secondary | ICD-10-CM | POA: Diagnosis not present

## 2021-10-06 DIAGNOSIS — R058 Other specified cough: Secondary | ICD-10-CM | POA: Diagnosis not present

## 2021-11-02 ENCOUNTER — Other Ambulatory Visit: Payer: Self-pay | Admitting: Family Medicine

## 2021-11-02 DIAGNOSIS — E039 Hypothyroidism, unspecified: Secondary | ICD-10-CM

## 2021-11-02 DIAGNOSIS — K219 Gastro-esophageal reflux disease without esophagitis: Secondary | ICD-10-CM

## 2021-11-03 ENCOUNTER — Ambulatory Visit: Payer: Medicare PPO | Admitting: Family Medicine

## 2021-11-03 ENCOUNTER — Encounter: Payer: Self-pay | Admitting: Family Medicine

## 2021-11-03 VITALS — BP 120/64 | HR 64 | Ht 68.0 in | Wt 168.0 lb

## 2021-11-03 DIAGNOSIS — R739 Hyperglycemia, unspecified: Secondary | ICD-10-CM | POA: Diagnosis not present

## 2021-11-03 DIAGNOSIS — I700 Atherosclerosis of aorta: Secondary | ICD-10-CM

## 2021-11-03 DIAGNOSIS — K219 Gastro-esophageal reflux disease without esophagitis: Secondary | ICD-10-CM | POA: Diagnosis not present

## 2021-11-03 DIAGNOSIS — J00 Acute nasopharyngitis [common cold]: Secondary | ICD-10-CM

## 2021-11-03 DIAGNOSIS — E039 Hypothyroidism, unspecified: Secondary | ICD-10-CM

## 2021-11-03 DIAGNOSIS — F419 Anxiety disorder, unspecified: Secondary | ICD-10-CM | POA: Diagnosis not present

## 2021-11-03 DIAGNOSIS — F3342 Major depressive disorder, recurrent, in full remission: Secondary | ICD-10-CM

## 2021-11-03 MED ORDER — SERTRALINE HCL 100 MG PO TABS: 100.0000 mg | ORAL_TABLET | Freq: Every day | ORAL | 1 refills | Status: DC

## 2021-11-03 MED ORDER — CETIRIZINE HCL 10 MG PO TABS: 10.0000 mg | ORAL_TABLET | Freq: Every day | ORAL | 1 refills | Status: DC

## 2021-11-03 MED ORDER — AZELASTINE HCL 0.1 % NA SOLN: 2.0000 | Freq: Two times a day (BID) | NASAL | 12 refills | Status: AC

## 2021-11-03 MED ORDER — PANTOPRAZOLE SODIUM 40 MG PO TBEC: 40.0000 mg | DELAYED_RELEASE_TABLET | Freq: Every day | ORAL | 1 refills | Status: DC

## 2021-11-03 MED ORDER — CLOPIDOGREL BISULFATE 75 MG PO TABS: 75.0000 mg | ORAL_TABLET | Freq: Every day | ORAL | 1 refills | Status: DC

## 2021-11-03 MED ORDER — LEVOTHYROXINE SODIUM 125 MCG PO TABS: 125.0000 ug | ORAL_TABLET | Freq: Every day | ORAL | 1 refills | Status: DC

## 2021-11-03 NOTE — Progress Notes (Signed)
Date:  11/03/2021   Name:  Joel Flores   DOB:  June 16, 1950   MRN:  [id number]   Chief Complaint: Depression, Gastroesophageal Reflux, Hypothyroidism, Allergic Rhinitis , Coronary Artery Disease, and Hyperglycemia  Depression        This is a chronic problem.  The current episode started more than 1 year ago.   The onset quality is gradual.   Associated symptoms include irritable.  Associated symptoms include no decreased concentration, no fatigue, no helplessness, no hopelessness, does not have insomnia, no restlessness, no decreased interest, no appetite change, no body aches, no myalgias, no headaches, no indigestion, not sad and no suicidal ideas.  Past treatments include SSRIs - Selective serotonin reuptake inhibitors.  Compliance with treatment is good.  Previous treatment provided mild relief.  Past medical history includes thyroid problem.   Gastroesophageal Reflux He reports no chest pain, no choking, no dysphagia, no heartburn or no wheezing. This is a new problem. The current episode started in the past 7 days. The problem has been gradually improving. Pertinent negatives include no fatigue or weight loss. He has tried a PPI for the symptoms. The treatment provided moderate relief.  Coronary Artery Disease Presents for follow-up visit. Pertinent negatives include no chest pain, chest tightness, palpitations, shortness of breath or weight gain. Symptom course: waxes and wanes.  Hyperglycemia This is a chronic problem. The current episode started more than 1 year ago. The problem occurs intermittently. The problem has been gradually improving. Pertinent negatives include no chest pain, congestion, fatigue, headaches, myalgias or urinary symptoms.  Thyroid Problem Presents for follow-up visit. Patient reports no cold intolerance, constipation, depressed mood, fatigue, palpitations, weight gain or weight loss. The symptoms have been stable.    Lab Results  Component Value Date   NA 143  05/06/2021   K 4.3 05/06/2021   CO2 21 05/06/2021   GLUCOSE 125 (H) 05/06/2021   BUN 17 05/06/2021   CREATININE 0.83 05/06/2021   CALCIUM 9.6 05/06/2021   EGFR 94 05/06/2021   GFRNONAA 92 05/20/2020   Lab Results  Component Value Date   CHOL 113 05/06/2021   HDL 34 (L) 05/06/2021   LDLCALC 57 05/06/2021   TRIG 124 05/06/2021   CHOLHDL 3.7 06/17/2018   Lab Results  Component Value Date   TSH 1.520 05/06/2021   No results found for: "HGBA1C" Lab Results  Component Value Date   WBC 8.5 11/11/2018   HGB 15.1 11/11/2018   HCT 44.1 11/11/2018   MCV 86 11/11/2018   PLT 322 11/11/2018   Lab Results  Component Value Date   ALT 19 05/20/2020   AST 24 05/20/2020   ALKPHOS 98 05/20/2020   BILITOT 0.4 05/20/2020   No results found for: "25OHVITD2", "25OHVITD3", "VD25OH"   Review of Systems  Constitutional:  Negative for appetite change, fatigue, weight gain and weight loss.  HENT:  Negative for congestion.   Respiratory:  Negative for choking, chest tightness, shortness of breath and wheezing.   Cardiovascular:  Negative for chest pain and palpitations.  Gastrointestinal:  Negative for blood in stool, constipation, dysphagia and heartburn.  Endocrine: Negative for cold intolerance.  Musculoskeletal:  Negative for myalgias.  Neurological:  Negative for headaches.  Psychiatric/Behavioral:  Positive for depression. Negative for decreased concentration and suicidal ideas. The patient does not have insomnia.     Patient Active Problem List   Diagnosis Date Noted   Bilateral carotid artery stenosis 09/20/2019   Colon cancer screening  Polyp of sigmoid colon    SOBOE (shortness of breath on exertion) 07/30/2017   PVD (peripheral vascular disease) ([place]) 11/17/2016   Tobacco use disorder 11/17/2016   Reactive airway disease, mild intermittent, uncomplicated 53/97/6734   Allergic rhinitis due to pollen 08/13/2015   Nocturia 08/13/2015   Atherosclerosis of aorta ([place])  02/12/2015   Essential hypertension 02/12/2015   Esophageal reflux 02/12/2015   Depression 02/12/2015   Adult hypothyroidism 02/12/2015   Chronic anxiety 02/12/2015   Hyperlipemia 02/12/2015    Allergies  Allergen Reactions   Sulfa Antibiotics Rash    Past Surgical History:  Procedure Laterality Date   CATARACT EXTRACTION W/PHACO Right 03/16/2018   Procedure: CATARACT EXTRACTION PHACO AND INTRAOCULAR LENS PLACEMENT ([place]);  Surgeon: Marchia Meiers, MD;  Location: [facility];  Service: Ophthalmology;  Laterality: Right;  [place]  01:05 CDE 11.85 Fluid pack lot # [id number]   CATARACT EXTRACTION W/PHACO Left 05/08/2020   Procedure: CATARACT EXTRACTION PHACO AND INTRAOCULAR LENS PLACEMENT ([facility]) LEFT 7.32 00:54.7 13.4%;  Surgeon: Leandrew Koyanagi, MD;  Location: [place];  Service: Ophthalmology;  Laterality: Left;   CHOLECYSTECTOMY     COLONOSCOPY  2015   Dr Allen Norris- cleared for 5 years    COLONOSCOPY WITH PROPOFOL N/A 01/17/2018   Procedure: COLONOSCOPY WITH PROPOFOL;  Surgeon: Lucilla Lame, MD;  Location: [place];  Service: Endoscopy;  Laterality: N/A;  requests early   HERNIA REPAIR  July 2019   INSERTION OF MESH N/A 08/31/2017   Procedure: INSERTION OF MESH;  Surgeon: Vickie Epley, MD;  Location: [facility];  Service: General;  Laterality: N/A;   POLYPECTOMY  01/17/2018   Procedure: POLYPECTOMY;  Surgeon: Lucilla Lame, MD;  Location: [place];  Service: Endoscopy;;   UMBILICAL HERNIA REPAIR N/A 08/31/2017   Procedure: HERNIA REPAIR UMBILICAL ADULT;  Surgeon: Vickie Epley, MD;  Location: [facility];  Service: General;  Laterality: N/A;   VEIN SURGERY     2 stents in R) leg    Social History   Tobacco Use   Smoking status: Every Day    Packs/day: 1.50    Years: 50.00    Total pack years: 75.00    Types: Cigarettes   Smokeless tobacco: Never  Vaping Use   Vaping Use: Never used  Substance Use Topics   Alcohol use: Yes    Comment: once a  month   Drug use: No     Medication list has been reviewed and updated.  Current Meds  Medication Sig   acetaminophen (TYLENOL) 325 MG tablet Take 325 mg by mouth daily as needed for moderate pain or headache.   albuterol (VENTOLIN HFA) 108 (90 Base) MCG/ACT inhaler Inhale into the lungs. Inhale 2 inhalations into the lungs 4 (four) times daily PRN   azelastine (ASTELIN) 0.1 % nasal spray Place 2 sprays into both nostrils 2 (two) times daily. Use in each nostril as directed   cetirizine (ZYRTEC) 10 MG tablet Take 1 tablet (10 mg total) by mouth daily.   clopidogrel (PLAVIX) 75 MG tablet Take 1 tablet (75 mg total) by mouth daily.   fluticasone (FLONASE) 50 MCG/ACT nasal spray SPRAY 2 SPRAYS INTO EACH NOSTRIL EVERY DAY   ibuprofen (ADVIL,MOTRIN) 200 MG tablet Take 400 mg by mouth every 8 (eight) hours as needed (for pain).   levothyroxine (SYNTHROID) 125 MCG tablet Take 1 tablet (125 mcg total) by mouth daily.   losartan (COZAAR) 100 MG tablet Take 100 mg by mouth daily.   pantoprazole (  PROTONIX) 40 MG tablet Take 1 tablet (40 mg total) by mouth daily.   sertraline (ZOLOFT) 100 MG tablet Take 1 tablet (100 mg total) by mouth daily.       11/03/2021    8:08 AM 02/25/2021   11:06 AM 11/13/2020    8:42 AM 08/23/2020   10:23 AM  GAD 7 : Generalized Anxiety Score  Nervous, Anxious, on Edge 0 0 0 1  Control/stop worrying 0 0 0 0  Worry too much - different things 0 0 0 0  Trouble relaxing 0 0 0 0  Restless 0 0 0 0  Easily annoyed or irritable 3 0 0 0  Afraid - awful might happen 0 0 0 0  Total GAD 7 Score 3 0 0 1  Anxiety Difficulty Not difficult at all   Not difficult at all       11/03/2021    8:07 AM 08/25/2021   10:31 AM 02/25/2021   11:05 AM  Depression screen PHQ 2/9  Decreased Interest 2 0 0  Down, Depressed, Hopeless 1 0 0  PHQ - 2 Score 3 0 0  Altered sleeping 1 2 0  Tired, decreased energy 2 2 0  Change in appetite 0 0 0  Feeling bad or failure about yourself  0 0 0   Trouble concentrating 0 0 0  Moving slowly or fidgety/restless 0 0 0  Suicidal thoughts 0 0 0  PHQ-9 Score 6 4 0  Difficult doing work/chores Not difficult at all Not difficult at all     BP Readings from Last 3 Encounters:  11/03/21 120/64  08/25/21 (!) 152/88  05/11/21 (!) 132/115    Physical Exam Vitals and nursing note reviewed.  Constitutional:      General: He is irritable.  HENT:     Head: Normocephalic.     Right Ear: External ear normal.     Left Ear: External ear normal.     Nose: Nose normal.  Eyes:     General: No scleral icterus.       Right eye: No discharge.        Left eye: No discharge.     Conjunctiva/sclera: Conjunctivae normal.     Pupils: Pupils are equal, round, and reactive to light.  Neck:     Thyroid: No thyromegaly.     Vascular: No JVD.     Trachea: No tracheal deviation.  Cardiovascular:     Rate and Rhythm: Normal rate and regular rhythm.     Heart sounds: Normal heart sounds. No murmur heard.    No friction rub. No gallop.  Pulmonary:     Effort: No respiratory distress.     Breath sounds: Normal breath sounds. No wheezing, rhonchi or rales.  Abdominal:     General: Bowel sounds are normal.     Palpations: Abdomen is soft. There is no mass.     Tenderness: There is no abdominal tenderness. There is no guarding or rebound.  Musculoskeletal:        General: No tenderness. Normal range of motion.     Cervical back: Neck supple.  Lymphadenopathy:     Cervical: No cervical adenopathy.  Skin:    General: Skin is warm.     Findings: No rash.  Neurological:     Mental Status: He is alert and oriented to person, place, and time.     Cranial Nerves: No cranial nerve deficit.     Deep Tendon Reflexes: Reflexes are normal and symmetric.  Wt Readings from Last 3 Encounters:  11/03/21 168 lb (76.2 kg)  09/23/21 172 lb (78 kg)  08/25/21 171 lb (77.6 kg)    BP 120/64   Pulse 64   Ht 5' 8" (1.727 m)   Wt 168 lb (76.2 kg)   BMI  25.54 kg/m   Assessment and Plan:  1. Adult hypothyroidism Chronic.  Controlled.  Stable.  Continue levothyroxine 125 mg once a day.  Will check thyroid panel with TSH. - levothyroxine (SYNTHROID) 125 MCG tablet; Take 1 tablet (125 mcg total) by mouth daily.  Dispense: 90 tablet; Refill: 1 - Thyroid Panel With TSH  2. Acute rhinitis Chronic.  Controlled.  Stable.  Continue Astelin nasal spray 2 sprays in both nostrils daily. - azelastine (ASTELIN) 0.1 % nasal spray; Place 2 sprays into both nostrils 2 (two) times daily. Use in each nostril as directed  Dispense: 30 mL; Refill: 12  3. Atherosclerosis of aorta (H[facility]) Chronic.  Controlled.  Stable.  Continue Plavix 75 mg once a day.  It was noted that he has some vessel disease and aortic sclerosis on CT scan.  Patient is also been encouraged to quit smoking. - clopidogrel (PLAVIX) 75 MG tablet; Take 1 tablet (75 mg total) by mouth daily.  Dispense: 90 tablet; Refill: 1  4. Gastroesophageal reflux disease, unspecified whether esophagitis present Chronic.  Controlled.  Stable.  Continue pantoprazole 40 mg once a day. - pantoprazole (PROTONIX) 40 MG tablet; Take 1 tablet (40 mg total) by mouth daily.  Dispense: 90 tablet; Refill: 1  5. Chronic anxiety Chronic.  Controlled.  Stable.  GAD score is pertinent for irritability which is baseline.  Continue sertraline 100 mg once a day. - sertraline (ZOLOFT) 100 MG tablet; Take 1 tablet (100 mg total) by mouth daily.  Dispense: 90 tablet; Refill: 1  6. Recurrent major depressive disorder, in full remission ([place]) Chronic.  Controlled.  Stable.  Continue sertraline 100 mg once a day. - sertraline (ZOLOFT) 100 MG tablet; Take 1 tablet (100 mg total) by mouth daily.  Dispense: 90 tablet; Refill: 1  7. Hyperglycemia Noted last glucose reading was 125 mcg/dL.  Will check A1c to see if diabetic. - HgB A1c

## 2021-11-04 ENCOUNTER — Other Ambulatory Visit: Payer: Self-pay

## 2021-11-04 DIAGNOSIS — E039 Hypothyroidism, unspecified: Secondary | ICD-10-CM

## 2021-11-04 DIAGNOSIS — E119 Type 2 diabetes mellitus without complications: Secondary | ICD-10-CM

## 2021-11-04 LAB — HEMOGLOBIN A1C
Est. average glucose Bld gHb Est-mCnc: 157 mg/dL
Hgb A1c MFr Bld: 7.1 % — ABNORMAL HIGH (ref 4.8–5.6)

## 2021-11-04 LAB — THYROID PANEL WITH TSH
Free Thyroxine Index: 3.4 (ref 1.2–4.9)
T3 Uptake Ratio: 31 % (ref 24–39)
T4, Total: 11.1 ug/dL (ref 4.5–12.0)
TSH: 0.129 u[IU]/mL — ABNORMAL LOW (ref 0.450–4.500)

## 2021-11-04 MED ORDER — METFORMIN HCL 500 MG PO TABS: 500.0000 mg | ORAL_TABLET | Freq: Every day | ORAL | 1 refills | Status: DC

## 2021-11-04 MED ORDER — LEVOTHYROXINE SODIUM 112 MCG PO TABS: 112.0000 ug | ORAL_TABLET | Freq: Every day | ORAL | 1 refills | Status: DC

## 2021-11-12 ENCOUNTER — Ambulatory Visit: Payer: Medicare PPO | Admitting: Family Medicine

## 2021-11-24 DIAGNOSIS — Z872 Personal history of diseases of the skin and subcutaneous tissue: Secondary | ICD-10-CM | POA: Diagnosis not present

## 2021-11-24 DIAGNOSIS — A630 Anogenital (venereal) warts: Secondary | ICD-10-CM | POA: Diagnosis not present

## 2021-11-24 DIAGNOSIS — L578 Other skin changes due to chronic exposure to nonionizing radiation: Secondary | ICD-10-CM | POA: Diagnosis not present

## 2021-11-24 DIAGNOSIS — D239 Other benign neoplasm of skin, unspecified: Secondary | ICD-10-CM | POA: Diagnosis not present

## 2021-11-24 DIAGNOSIS — L570 Actinic keratosis: Secondary | ICD-10-CM | POA: Diagnosis not present

## 2021-11-25 ENCOUNTER — Telehealth: Payer: Self-pay | Admitting: Family Medicine

## 2021-11-25 NOTE — Telephone Encounter (Signed)
Copied from [place] [id number]. Topic: General - Other >> Nov 25, 2021  1:45 PM Ludger Nutting wrote: Pt said he would like to come into the office this week for repeat labs for the labs drawn on 7/11. No lab orders in Epic. Please advise pt.

## 2021-11-26 ENCOUNTER — Other Ambulatory Visit: Payer: Self-pay | Admitting: Family Medicine

## 2021-11-26 DIAGNOSIS — E119 Type 2 diabetes mellitus without complications: Secondary | ICD-10-CM

## 2021-11-27 ENCOUNTER — Other Ambulatory Visit: Payer: Self-pay

## 2021-11-27 DIAGNOSIS — E119 Type 2 diabetes mellitus without complications: Secondary | ICD-10-CM | POA: Diagnosis not present

## 2021-11-27 DIAGNOSIS — E039 Hypothyroidism, unspecified: Secondary | ICD-10-CM | POA: Diagnosis not present

## 2021-11-28 LAB — TSH: TSH: 0.535 u[IU]/mL (ref 0.450–4.500)

## 2021-11-28 LAB — MICROALBUMIN, URINE: Microalbumin, Urine: 12.4 ug/mL

## 2021-11-28 LAB — HEMOGLOBIN A1C
Est. average glucose Bld gHb Est-mCnc: 154 mg/dL
Hgb A1c MFr Bld: 7 % — ABNORMAL HIGH (ref 4.8–5.6)

## 2021-12-21 ENCOUNTER — Other Ambulatory Visit: Payer: Self-pay | Admitting: Family Medicine

## 2021-12-21 DIAGNOSIS — E119 Type 2 diabetes mellitus without complications: Secondary | ICD-10-CM

## 2021-12-22 NOTE — Telephone Encounter (Signed)
Requested medication (s) are due for refill today- yes  Requested medication (s) are on the active medication list -yes  Future visit scheduled -yes  Last refill: 11/27/21 #30  Notes to clinic: fails lab protocol- over 1 year- 2020  Requested Prescriptions  Pending Prescriptions Disp Refills   metFORMIN (GLUCOPHAGE) 500 MG tablet [Pharmacy Med Name: METFORMIN HCL 500 MG TABLET] 30 tablet 0    Sig: TAKE 1 TABLET BY MOUTH EVERY DAY WITH BREAKFAST     Endocrinology:  Diabetes - Biguanides Failed - 12/21/2021 11:30 AM      Failed - B12 Level in normal range and within 720 days    No results found for: "VITAMINB12"       Failed - CBC within normal limits and completed in the last 12 months    WBC  Date Value Ref Range Status  11/11/2018 8.5 3.4 - 10.8 x10E3/uL Final  11/02/2016 14.0 (H) 3.8 - 10.6 K/uL Final   RBC  Date Value Ref Range Status  11/11/2018 5.15 4.14 - 5.80 x10E6/uL Final  11/02/2016 4.26 (L) 4.40 - 5.90 MIL/uL Final   Hemoglobin  Date Value Ref Range Status  11/11/2018 15.1 13.0 - 17.7 g/dL Final   Hematocrit  Date Value Ref Range Status  11/11/2018 44.1 37.5 - 51.0 % Final   MCHC  Date Value Ref Range Status  11/11/2018 34.2 31.5 - 35.7 g/dL Final  11/02/2016 34.4 32.0 - 36.0 g/dL Final   [facility]  Date Value Ref Range Status  11/11/2018 29.3 26.6 - 33.0 pg Final  11/02/2016 31.2 26.0 - 34.0 pg Final   MCV  Date Value Ref Range Status  11/11/2018 86 79 - 97 fL Final   No results found for: "PLTCOUNTKUC", "LABPLAT", "POCPLA" RDW  Date Value Ref Range Status  11/11/2018 14.0 11.6 - 15.4 % Final         Passed - Cr in normal range and within 360 days    Creatinine, Ser  Date Value Ref Range Status  05/06/2021 0.83 0.76 - 1.27 mg/dL Final         Passed - HBA1C is between 0 and 7.9 and within 180 days    Hgb A1c MFr Bld  Date Value Ref Range Status  11/27/2021 7.0 (H) 4.8 - 5.6 % Final    Comment:             Prediabetes: 5.7 - 6.4           Diabetes: >6.4          Glycemic control for adults with diabetes: <7.0          Passed - eGFR in normal range and within 360 days    GFR calc Af Amer  Date Value Ref Range Status  05/20/2020 106 >59 mL/min/1.73 Final    Comment:    **In accordance with recommendations from the NKF-ASN Task force,**   [facility] is in the process of updating its eGFR calculation to the   2021 CKD-EPI creatinine equation that estimates kidney function   without a race variable.    GFR calc non Af Amer  Date Value Ref Range Status  05/20/2020 92 >59 mL/min/1.73 Final   eGFR  Date Value Ref Range Status  05/06/2021 94 >59 mL/min/1.73 Final         Passed - Valid encounter within last 6 months    Recent Outpatient Visits           1 month ago Adult hypothyroidism  [place] [facility] at [facility], MD   6 months ago [place] [facility] at [facility], MD   7 months ago Adult hypothyroidism   [place] [facility] at [facility], MD   7 months ago Acute maxillary sinusitis, recurrence not specified   [place] [facility] at [facility], MD   10 months ago Acute maxillary sinusitis, recurrence not specified   [place] [facility] at [facility], MD       Future Appointments             In 4 months Juline Patch, MD [facility] at [facility], [facility]               Requested Prescriptions  Pending Prescriptions Disp Refills   metFORMIN (GLUCOPHAGE) 500 MG tablet [Pharmacy Med Name: METFORMIN HCL 500 MG TABLET] 30 tablet 0    Sig: TAKE 1 TABLET BY MOUTH EVERY DAY WITH BREAKFAST     Endocrinology:  Diabetes - Biguanides Failed - 12/21/2021 11:30 AM      Failed - B12 Level in normal range and within 720 days    No  results found for: "VITAMINB12"       Failed - CBC within normal limits and completed in the last 12 months    WBC  Date Value Ref Range Status  11/11/2018 8.5 3.4 - 10.8 x10E3/uL Final  11/02/2016 14.0 (H) 3.8 - 10.6 K/uL Final   RBC  Date Value Ref Range Status  11/11/2018 5.15 4.14 - 5.80 x10E6/uL Final  11/02/2016 4.26 (L) 4.40 - 5.90 MIL/uL Final   Hemoglobin  Date Value Ref Range Status  11/11/2018 15.1 13.0 - 17.7 g/dL Final   Hematocrit  Date Value Ref Range Status  11/11/2018 44.1 37.5 - 51.0 % Final   MCHC  Date Value Ref Range Status  11/11/2018 34.2 31.5 - 35.7 g/dL Final  11/02/2016 34.4 32.0 - 36.0 g/dL Final   [facility]  Date Value Ref Range Status  11/11/2018 29.3 26.6 - 33.0 pg Final  11/02/2016 31.2 26.0 - 34.0 pg Final   MCV  Date Value Ref Range Status  11/11/2018 86 79 - 97 fL Final   No results found for: "PLTCOUNTKUC", "LABPLAT", "POCPLA" RDW  Date Value Ref Range Status  11/11/2018 14.0 11.6 - 15.4 % Final         Passed - Cr in normal range and within 360 days    Creatinine, Ser  Date Value Ref Range Status  05/06/2021 0.83 0.76 - 1.27 mg/dL Final         Passed - HBA1C is between 0 and 7.9 and within 180 days    Hgb A1c MFr Bld  Date Value Ref Range Status  11/27/2021 7.0 (H) 4.8 - 5.6 % Final    Comment:             Prediabetes: 5.7 - 6.4          Diabetes: >6.4          Glycemic control for adults with diabetes: <7.0          Passed - eGFR in normal range and within 360 days    GFR calc Af Amer  Date Value Ref Range Status  05/20/2020 106 >  59 mL/min/1.73 Final    Comment:    **In accordance with recommendations from the NKF-ASN Task force,**   [facility] is in the process of updating its eGFR calculation to the   2021 CKD-EPI creatinine equation that estimates kidney function   without a race variable.    GFR calc non Af Amer  Date Value Ref Range Status  05/20/2020 92 >59 mL/min/1.73 Final   eGFR  Date Value Ref Range  Status  05/06/2021 94 >59 mL/min/1.73 Final         Passed - Valid encounter within last 6 months    Recent Outpatient Visits           1 month ago Adult hypothyroidism   [place] [facility] [place], Deanna C, MD   6 months ago [place] [facility] at [place], Deanna C, MD   7 months ago Adult hypothyroidism   [place] [facility] at [place], Deanna C, MD   7 months ago Acute maxillary sinusitis, recurrence not specified   [place] [facility] at [place], [place], MD   10 months ago Acute maxillary sinusitis, recurrence not specified   [place] [facility] at [place], [place], MD       Future Appointments             In 4 months Juline Patch, MD [place] [facility] at [facility], [facility]

## 2021-12-23 ENCOUNTER — Other Ambulatory Visit: Payer: Self-pay | Admitting: Family Medicine

## 2021-12-23 DIAGNOSIS — E039 Hypothyroidism, unspecified: Secondary | ICD-10-CM

## 2022-02-23 ENCOUNTER — Encounter (INDEPENDENT_AMBULATORY_CARE_PROVIDER_SITE_OTHER): Payer: Self-pay

## 2022-02-26 DIAGNOSIS — M9903 Segmental and somatic dysfunction of lumbar region: Secondary | ICD-10-CM | POA: Diagnosis not present

## 2022-02-26 DIAGNOSIS — M9902 Segmental and somatic dysfunction of thoracic region: Secondary | ICD-10-CM | POA: Diagnosis not present

## 2022-02-26 DIAGNOSIS — M9905 Segmental and somatic dysfunction of pelvic region: Secondary | ICD-10-CM | POA: Diagnosis not present

## 2022-02-26 DIAGNOSIS — M5417 Radiculopathy, lumbosacral region: Secondary | ICD-10-CM | POA: Diagnosis not present

## 2022-02-26 DIAGNOSIS — M9904 Segmental and somatic dysfunction of sacral region: Secondary | ICD-10-CM | POA: Diagnosis not present

## 2022-02-26 DIAGNOSIS — M5387 Other specified dorsopathies, lumbosacral region: Secondary | ICD-10-CM | POA: Diagnosis not present

## 2022-02-26 DIAGNOSIS — M9901 Segmental and somatic dysfunction of cervical region: Secondary | ICD-10-CM | POA: Diagnosis not present

## 2022-03-02 DIAGNOSIS — M9902 Segmental and somatic dysfunction of thoracic region: Secondary | ICD-10-CM | POA: Diagnosis not present

## 2022-03-02 DIAGNOSIS — M5417 Radiculopathy, lumbosacral region: Secondary | ICD-10-CM | POA: Diagnosis not present

## 2022-03-02 DIAGNOSIS — M9905 Segmental and somatic dysfunction of pelvic region: Secondary | ICD-10-CM | POA: Diagnosis not present

## 2022-03-02 DIAGNOSIS — M9903 Segmental and somatic dysfunction of lumbar region: Secondary | ICD-10-CM | POA: Diagnosis not present

## 2022-03-02 DIAGNOSIS — M9901 Segmental and somatic dysfunction of cervical region: Secondary | ICD-10-CM | POA: Diagnosis not present

## 2022-03-02 DIAGNOSIS — M9904 Segmental and somatic dysfunction of sacral region: Secondary | ICD-10-CM | POA: Diagnosis not present

## 2022-03-02 DIAGNOSIS — M5387 Other specified dorsopathies, lumbosacral region: Secondary | ICD-10-CM | POA: Diagnosis not present

## 2022-03-09 DIAGNOSIS — M5417 Radiculopathy, lumbosacral region: Secondary | ICD-10-CM | POA: Diagnosis not present

## 2022-03-09 DIAGNOSIS — M5387 Other specified dorsopathies, lumbosacral region: Secondary | ICD-10-CM | POA: Diagnosis not present

## 2022-03-09 DIAGNOSIS — M9905 Segmental and somatic dysfunction of pelvic region: Secondary | ICD-10-CM | POA: Diagnosis not present

## 2022-03-09 DIAGNOSIS — M9904 Segmental and somatic dysfunction of sacral region: Secondary | ICD-10-CM | POA: Diagnosis not present

## 2022-03-09 DIAGNOSIS — M9903 Segmental and somatic dysfunction of lumbar region: Secondary | ICD-10-CM | POA: Diagnosis not present

## 2022-03-09 DIAGNOSIS — M9902 Segmental and somatic dysfunction of thoracic region: Secondary | ICD-10-CM | POA: Diagnosis not present

## 2022-03-09 DIAGNOSIS — M9901 Segmental and somatic dysfunction of cervical region: Secondary | ICD-10-CM | POA: Diagnosis not present

## 2022-03-18 DIAGNOSIS — I70219 Atherosclerosis of native arteries of extremities with intermittent claudication, unspecified extremity: Secondary | ICD-10-CM | POA: Diagnosis not present

## 2022-03-18 DIAGNOSIS — E782 Mixed hyperlipidemia: Secondary | ICD-10-CM | POA: Diagnosis not present

## 2022-03-18 DIAGNOSIS — I6523 Occlusion and stenosis of bilateral carotid arteries: Secondary | ICD-10-CM | POA: Diagnosis not present

## 2022-03-18 DIAGNOSIS — I25118 Atherosclerotic heart disease of native coronary artery with other forms of angina pectoris: Secondary | ICD-10-CM | POA: Diagnosis not present

## 2022-03-18 DIAGNOSIS — R0602 Shortness of breath: Secondary | ICD-10-CM | POA: Diagnosis not present

## 2022-03-18 DIAGNOSIS — I10 Essential (primary) hypertension: Secondary | ICD-10-CM | POA: Diagnosis not present

## 2022-03-22 ENCOUNTER — Other Ambulatory Visit: Payer: Self-pay | Admitting: Family Medicine

## 2022-03-22 DIAGNOSIS — E119 Type 2 diabetes mellitus without complications: Secondary | ICD-10-CM

## 2022-03-23 DIAGNOSIS — M9901 Segmental and somatic dysfunction of cervical region: Secondary | ICD-10-CM | POA: Diagnosis not present

## 2022-03-23 DIAGNOSIS — M9902 Segmental and somatic dysfunction of thoracic region: Secondary | ICD-10-CM | POA: Diagnosis not present

## 2022-03-23 DIAGNOSIS — M9904 Segmental and somatic dysfunction of sacral region: Secondary | ICD-10-CM | POA: Diagnosis not present

## 2022-03-23 DIAGNOSIS — M9903 Segmental and somatic dysfunction of lumbar region: Secondary | ICD-10-CM | POA: Diagnosis not present

## 2022-03-23 DIAGNOSIS — M5387 Other specified dorsopathies, lumbosacral region: Secondary | ICD-10-CM | POA: Diagnosis not present

## 2022-03-23 DIAGNOSIS — M5417 Radiculopathy, lumbosacral region: Secondary | ICD-10-CM | POA: Diagnosis not present

## 2022-03-23 DIAGNOSIS — M9905 Segmental and somatic dysfunction of pelvic region: Secondary | ICD-10-CM | POA: Diagnosis not present

## 2022-03-25 ENCOUNTER — Other Ambulatory Visit: Payer: Self-pay | Admitting: Family Medicine

## 2022-03-25 DIAGNOSIS — E039 Hypothyroidism, unspecified: Secondary | ICD-10-CM

## 2022-03-25 NOTE — Telephone Encounter (Signed)
Requested Prescriptions  Pending Prescriptions Disp Refills   levothyroxine (SYNTHROID) 112 MCG tablet [Pharmacy Med Name: LEVOTHYROXINE 112 MCG TABLET] 90 tablet 0    Sig: TAKE 1 TABLET BY MOUTH EVERY DAY     Endocrinology:  Hypothyroid Agents Passed - 03/25/2022  9:57 AM      Passed - TSH in normal range and within 360 days    TSH  Date Value Ref Range Status  11/27/2021 0.535 0.450 - 4.500 uIU/mL Final         Passed - Valid encounter within last 12 months    Recent Outpatient Visits           4 months ago Adult hypothyroidism   [place] [facility] at [place], Deanna C, MD   9 months ago [place] [facility] at [place], Deanna C, MD   10 months ago Adult hypothyroidism   [place] [facility] at [place], [place], MD   11 months ago Acute maxillary sinusitis, recurrence not specified   [place] [facility] at [place], Deanna C, MD   1 year ago Acute maxillary sinusitis, recurrence not specified   [place] [facility] at [place], [place], MD       Future Appointments             In 1 month Juline Patch, MD [place] [facility] at [facility], [facility]

## 2022-04-01 DIAGNOSIS — I25118 Atherosclerotic heart disease of native coronary artery with other forms of angina pectoris: Secondary | ICD-10-CM | POA: Diagnosis not present

## 2022-04-01 DIAGNOSIS — R0602 Shortness of breath: Secondary | ICD-10-CM | POA: Diagnosis not present

## 2022-04-06 DIAGNOSIS — M9902 Segmental and somatic dysfunction of thoracic region: Secondary | ICD-10-CM | POA: Diagnosis not present

## 2022-04-06 DIAGNOSIS — M9905 Segmental and somatic dysfunction of pelvic region: Secondary | ICD-10-CM | POA: Diagnosis not present

## 2022-04-06 DIAGNOSIS — M9903 Segmental and somatic dysfunction of lumbar region: Secondary | ICD-10-CM | POA: Diagnosis not present

## 2022-04-06 DIAGNOSIS — M9901 Segmental and somatic dysfunction of cervical region: Secondary | ICD-10-CM | POA: Diagnosis not present

## 2022-04-06 DIAGNOSIS — M5417 Radiculopathy, lumbosacral region: Secondary | ICD-10-CM | POA: Diagnosis not present

## 2022-04-06 DIAGNOSIS — M9904 Segmental and somatic dysfunction of sacral region: Secondary | ICD-10-CM | POA: Diagnosis not present

## 2022-04-06 DIAGNOSIS — M5387 Other specified dorsopathies, lumbosacral region: Secondary | ICD-10-CM | POA: Diagnosis not present

## 2022-04-21 ENCOUNTER — Other Ambulatory Visit: Payer: Self-pay | Admitting: Family Medicine

## 2022-04-22 DIAGNOSIS — I70219 Atherosclerosis of native arteries of extremities with intermittent claudication, unspecified extremity: Secondary | ICD-10-CM | POA: Diagnosis not present

## 2022-04-22 DIAGNOSIS — I25118 Atherosclerotic heart disease of native coronary artery with other forms of angina pectoris: Secondary | ICD-10-CM | POA: Diagnosis not present

## 2022-04-22 DIAGNOSIS — E782 Mixed hyperlipidemia: Secondary | ICD-10-CM | POA: Diagnosis not present

## 2022-04-22 DIAGNOSIS — I6523 Occlusion and stenosis of bilateral carotid arteries: Secondary | ICD-10-CM | POA: Diagnosis not present

## 2022-04-22 DIAGNOSIS — I10 Essential (primary) hypertension: Secondary | ICD-10-CM | POA: Diagnosis not present

## 2022-04-28 DIAGNOSIS — M9902 Segmental and somatic dysfunction of thoracic region: Secondary | ICD-10-CM | POA: Diagnosis not present

## 2022-04-28 DIAGNOSIS — M9904 Segmental and somatic dysfunction of sacral region: Secondary | ICD-10-CM | POA: Diagnosis not present

## 2022-04-28 DIAGNOSIS — M9903 Segmental and somatic dysfunction of lumbar region: Secondary | ICD-10-CM | POA: Diagnosis not present

## 2022-04-28 DIAGNOSIS — M9905 Segmental and somatic dysfunction of pelvic region: Secondary | ICD-10-CM | POA: Diagnosis not present

## 2022-04-28 DIAGNOSIS — M5387 Other specified dorsopathies, lumbosacral region: Secondary | ICD-10-CM | POA: Diagnosis not present

## 2022-04-28 DIAGNOSIS — M5417 Radiculopathy, lumbosacral region: Secondary | ICD-10-CM | POA: Diagnosis not present

## 2022-04-28 DIAGNOSIS — M9901 Segmental and somatic dysfunction of cervical region: Secondary | ICD-10-CM | POA: Diagnosis not present

## 2022-04-30 ENCOUNTER — Other Ambulatory Visit: Payer: Self-pay | Admitting: Family Medicine

## 2022-04-30 DIAGNOSIS — K219 Gastro-esophageal reflux disease without esophagitis: Secondary | ICD-10-CM

## 2022-04-30 NOTE — Telephone Encounter (Signed)
Requested Prescriptions  Pending Prescriptions Disp Refills   pantoprazole (PROTONIX) 40 MG tablet [Pharmacy Med Name: PANTOPRAZOLE SOD DR 40 MG TAB] 90 tablet 2    Sig: TAKE 1 TABLET BY MOUTH EVERY DAY     Gastroenterology: Proton Pump Inhibitors Passed - 04/30/2022  1:36 AM      Passed - Valid encounter within last 12 months    Recent Outpatient Visits           5 months ago Adult hypothyroidism   [place] [facility] at [place], Deanna C, MD   10 months ago [place] [facility] at [place], Deanna C, MD   11 months ago Adult hypothyroidism   [place] [facility] at [place], Deanna C, MD   1 year ago Acute maxillary sinusitis, recurrence not specified   [place] [facility] at [place], Deanna C, MD   1 year ago Acute maxillary sinusitis, recurrence not specified    Primary Care and Sports Medicine at [place], [place], MD       Future Appointments             In 6 days Juline Patch, MD [place] and Sports Medicine at [facility], [facility]

## 2022-05-06 ENCOUNTER — Encounter: Payer: Self-pay | Admitting: Family Medicine

## 2022-05-06 ENCOUNTER — Ambulatory Visit: Payer: Medicare PPO | Admitting: Family Medicine

## 2022-05-06 VITALS — BP 120/76 | HR 110 | Ht 68.0 in | Wt 166.0 lb

## 2022-05-06 DIAGNOSIS — F3342 Major depressive disorder, recurrent, in full remission: Secondary | ICD-10-CM | POA: Diagnosis not present

## 2022-05-06 DIAGNOSIS — E119 Type 2 diabetes mellitus without complications: Secondary | ICD-10-CM

## 2022-05-06 DIAGNOSIS — E039 Hypothyroidism, unspecified: Secondary | ICD-10-CM

## 2022-05-06 DIAGNOSIS — I700 Atherosclerosis of aorta: Secondary | ICD-10-CM

## 2022-05-06 DIAGNOSIS — K219 Gastro-esophageal reflux disease without esophagitis: Secondary | ICD-10-CM | POA: Diagnosis not present

## 2022-05-06 DIAGNOSIS — E785 Hyperlipidemia, unspecified: Secondary | ICD-10-CM | POA: Diagnosis not present

## 2022-05-06 DIAGNOSIS — F419 Anxiety disorder, unspecified: Secondary | ICD-10-CM

## 2022-05-06 DIAGNOSIS — J301 Allergic rhinitis due to pollen: Secondary | ICD-10-CM | POA: Diagnosis not present

## 2022-05-06 DIAGNOSIS — F17200 Nicotine dependence, unspecified, uncomplicated: Secondary | ICD-10-CM

## 2022-05-06 MED ORDER — CLOPIDOGREL BISULFATE 75 MG PO TABS: 75.0000 mg | ORAL_TABLET | Freq: Every day | ORAL | 1 refills | Status: DC

## 2022-05-06 MED ORDER — FLUTICASONE PROPIONATE 50 MCG/ACT NA SUSP: NASAL | 1 refills | Status: DC

## 2022-05-06 MED ORDER — PANTOPRAZOLE SODIUM 40 MG PO TBEC: 40.0000 mg | DELAYED_RELEASE_TABLET | Freq: Every day | ORAL | 1 refills | Status: DC

## 2022-05-06 MED ORDER — METFORMIN HCL 500 MG PO TABS: ORAL_TABLET | ORAL | 1 refills | Status: DC

## 2022-05-06 MED ORDER — SERTRALINE HCL 100 MG PO TABS: 100.0000 mg | ORAL_TABLET | Freq: Every day | ORAL | 1 refills | Status: DC

## 2022-05-06 MED ORDER — CETIRIZINE HCL 10 MG PO TABS: 10.0000 mg | ORAL_TABLET | Freq: Every day | ORAL | 1 refills | Status: DC

## 2022-05-06 MED ORDER — FLUTICASONE PROPIONATE 50 MCG/ACT NA SUSP: NASAL | 11 refills | Status: DC

## 2022-05-06 MED ORDER — LEVOTHYROXINE SODIUM 112 MCG PO TABS: 112.0000 ug | ORAL_TABLET | Freq: Every day | ORAL | 1 refills | Status: DC

## 2022-05-06 NOTE — Progress Notes (Signed)
Date:  05/06/2022   Name:  Joel Flores   DOB:  December 30, 1950   MRN:  [id number]   Chief Complaint: Allergic Rhinitis , Hypothyroidism, Diabetes, Gastroesophageal Reflux, Depression, and Coronary Artery Disease  Diabetes He presents for his follow-up diabetic visit. He has type 2 diabetes mellitus. His disease course has been stable. There are no hypoglycemic associated symptoms. Pertinent negatives for hypoglycemia include no headaches. There are no diabetic associated symptoms. Pertinent negatives for diabetes include no blurred vision, no chest pain, no fatigue, no polydipsia, no polyphagia, no polyuria and no weakness. There are no hypoglycemic complications. Symptoms are stable. There are no diabetic complications. There are no known risk factors for coronary artery disease. When asked about current treatments, none were reported. His weight is stable. He is following a generally healthy diet. Meal planning includes avoidance of concentrated sweets and carbohydrate counting. An ACE inhibitor/angiotensin II receptor blocker is being taken.  Gastroesophageal Reflux He reports no abdominal pain, no chest pain, no choking, no dysphagia, no heartburn or no sore throat. The current episode started in the past 7 days. The problem has been gradually improving. The symptoms are aggravated by certain foods. Pertinent negatives include no fatigue or melena.  Depression        This is a chronic problem.  The current episode started more than 1 year ago.   The problem occurs intermittently.  The problem has been gradually improving since onset.  Associated symptoms include no decreased concentration, no fatigue, no helplessness, no hopelessness, does not have insomnia, not irritable, no restlessness, no decreased interest, no appetite change, no body aches, no myalgias, no headaches, no indigestion, not sad and no suicidal ideas.     The symptoms are aggravated by nothing.  Past treatments include SSRIs -  Selective serotonin reuptake inhibitors.  Previous treatment provided moderate relief.  Past medical history includes thyroid problem.   Coronary Artery Disease Presents for follow-up visit. Pertinent negatives include no chest pain, chest pressure, chest tightness or shortness of breath. The symptoms have been stable.  Thyroid Problem Presents for follow-up visit. Patient reports no diaphoresis, diarrhea, fatigue or heat intolerance. The symptoms have been stable.    Lab Results  Component Value Date   NA 143 05/06/2021   K 4.3 05/06/2021   CO2 21 05/06/2021   GLUCOSE 125 (H) 05/06/2021   BUN 17 05/06/2021   CREATININE 0.83 05/06/2021   CALCIUM 9.6 05/06/2021   EGFR 94 05/06/2021   GFRNONAA 92 05/20/2020   Lab Results  Component Value Date   CHOL 113 05/06/2021   HDL 34 (L) 05/06/2021   LDLCALC 57 05/06/2021   TRIG 124 05/06/2021   CHOLHDL 3.7 06/17/2018   Lab Results  Component Value Date   TSH 0.535 11/27/2021   Lab Results  Component Value Date   HGBA1C 7.0 (H) 11/27/2021   Lab Results  Component Value Date   WBC 8.5 11/11/2018   HGB 15.1 11/11/2018   HCT 44.1 11/11/2018   MCV 86 11/11/2018   PLT 322 11/11/2018   Lab Results  Component Value Date   ALT 19 05/20/2020   AST 24 05/20/2020   ALKPHOS 98 05/20/2020   BILITOT 0.4 05/20/2020   No results found for: "25OHVITD2", "25OHVITD3", "VD25OH"   Review of Systems  Constitutional:  Negative for appetite change, diaphoresis and fatigue.  HENT:  Negative for sore throat.   Eyes:  Negative for blurred vision.  Respiratory:  Negative for choking, chest tightness and  shortness of breath.   Cardiovascular:  Negative for chest pain.  Gastrointestinal:  Negative for abdominal pain, diarrhea, dysphagia, heartburn and melena.  Endocrine: Negative for heat intolerance, polydipsia, polyphagia and polyuria.  Musculoskeletal:  Negative for myalgias.  Neurological:  Negative for weakness and headaches.   Psychiatric/Behavioral:  Positive for depression. Negative for decreased concentration and suicidal ideas. The patient does not have insomnia.     Patient Active Problem List   Diagnosis Date Noted   Bilateral carotid artery stenosis 09/20/2019   Colon cancer screening    Polyp of sigmoid colon    SOBOE (shortness of breath on exertion) 07/30/2017   PVD (peripheral vascular disease) ([place]) 11/17/2016   Tobacco use disorder 11/17/2016   Reactive airway disease, mild intermittent, uncomplicated 49/44/9675   Allergic rhinitis due to pollen 08/13/2015   Nocturia 08/13/2015   Atherosclerosis of aorta ([place]) 02/12/2015   Essential hypertension 02/12/2015   Esophageal reflux 02/12/2015   Depression 02/12/2015   Adult hypothyroidism 02/12/2015   Chronic anxiety 02/12/2015   Hyperlipemia 02/12/2015    Allergies  Allergen Reactions   Sulfa Antibiotics Rash    Past Surgical History:  Procedure Laterality Date   CATARACT EXTRACTION W/PHACO Right 03/16/2018   Procedure: CATARACT EXTRACTION PHACO AND INTRAOCULAR LENS PLACEMENT ([place]);  Surgeon: Marchia Meiers, MD;  Location: [facility];  Service: Ophthalmology;  Laterality: Right;  [place]  01:05 CDE 11.85 Fluid pack lot # [id number]   CATARACT EXTRACTION W/PHACO Left 05/08/2020   Procedure: CATARACT EXTRACTION PHACO AND INTRAOCULAR LENS PLACEMENT ([facility]) LEFT 7.32 00:54.7 13.4%;  Surgeon: Leandrew Koyanagi, MD;  Location: [place];  Service: Ophthalmology;  Laterality: Left;   CHOLECYSTECTOMY     COLONOSCOPY  2015   Dr Allen Norris- cleared for 5 years    COLONOSCOPY WITH PROPOFOL N/A 01/17/2018   Procedure: COLONOSCOPY WITH PROPOFOL;  Surgeon: Lucilla Lame, MD;  Location: [place];  Service: Endoscopy;  Laterality: N/A;  requests early   HERNIA REPAIR  July 2019   INSERTION OF MESH N/A 08/31/2017   Procedure: INSERTION OF MESH;  Surgeon: Vickie Epley, MD;  Location: [facility];  Service: General;  Laterality: N/A;   POLYPECTOMY   01/17/2018   Procedure: POLYPECTOMY;  Surgeon: Lucilla Lame, MD;  Location: [place];  Service: Endoscopy;;   UMBILICAL HERNIA REPAIR N/A 08/31/2017   Procedure: HERNIA REPAIR UMBILICAL ADULT;  Surgeon: Vickie Epley, MD;  Location: [facility];  Service: General;  Laterality: N/A;   VEIN SURGERY     2 stents in R) leg    Social History   Tobacco Use   Smoking status: Every Day    Packs/day: 1.50    Years: 50.00    Total pack years: 75.00    Types: Cigarettes   Smokeless tobacco: Never  Vaping Use   Vaping Use: Never used  Substance Use Topics   Alcohol use: Yes    Comment: once a month   Drug use: No     Medication list has been reviewed and updated.  Current Meds  Medication Sig   acetaminophen (TYLENOL) 325 MG tablet Take 325 mg by mouth daily as needed for moderate pain or headache.   albuterol (VENTOLIN HFA) 108 (90 Base) MCG/ACT inhaler Inhale into the lungs. Inhale 2 inhalations into the lungs 4 (four) times daily PRN   azelastine (ASTELIN) 0.1 % nasal spray Place 2 sprays into both nostrils 2 (two) times daily. Use in each nostril as directed   cetirizine (ZYRTEC) 10 MG  tablet TAKE 1 TABLET BY MOUTH EVERY DAY   clopidogrel (PLAVIX) 75 MG tablet Take 1 tablet (75 mg total) by mouth daily.   fluticasone (FLONASE) 50 MCG/ACT nasal spray SPRAY 2 SPRAYS INTO EACH NOSTRIL EVERY DAY   ibuprofen (ADVIL,MOTRIN) 200 MG tablet Take 400 mg by mouth every 8 (eight) hours as needed (for pain).   levothyroxine (SYNTHROID) 112 MCG tablet TAKE 1 TABLET BY MOUTH EVERY DAY   losartan (COZAAR) 100 MG tablet Take 100 mg by mouth daily.   metFORMIN (GLUCOPHAGE) 500 MG tablet TAKE 1 TABLET BY MOUTH EVERY DAY WITH BREAKFAST   pantoprazole (PROTONIX) 40 MG tablet TAKE 1 TABLET BY MOUTH EVERY DAY   sertraline (ZOLOFT) 100 MG tablet Take 1 tablet (100 mg total) by mouth daily.       05/06/2022    8:07 AM 11/03/2021    8:08 AM 02/25/2021   11:06 AM 11/13/2020    8:42 AM  GAD 7  : Generalized Anxiety Score  Nervous, Anxious, on Edge 0 0 0 0  Control/stop worrying 0 0 0 0  Worry too much - different things 0 0 0 0  Trouble relaxing 0 0 0 0  Restless 0 0 0 0  Easily annoyed or irritable 0 3 0 0  Afraid - awful might happen 0 0 0 0  Total GAD 7 Score 0 3 0 0  Anxiety Difficulty Not difficult at all Not difficult at all         05/06/2022    8:07 AM 11/03/2021    8:07 AM 08/25/2021   10:31 AM  Depression screen PHQ 2/9  Decreased Interest 0 2 0  Down, Depressed, Hopeless 0 1 0  PHQ - 2 Score 0 3 0  Altered sleeping 0 1 2  Tired, decreased energy 0 2 2  Change in appetite 0 0 0  Feeling bad or failure about yourself  0 0 0  Trouble concentrating 0 0 0  Moving slowly or fidgety/restless 0 0 0  Suicidal thoughts 0 0 0  PHQ-9 Score 0 6 4  Difficult doing work/chores Not difficult at all Not difficult at all Not difficult at all    BP Readings from Last 3 Encounters:  05/06/22 120/76  11/03/21 120/64  08/25/21 (!) 152/88    Physical Exam Vitals and nursing note reviewed.  Constitutional:      General: He is not irritable. HENT:     Head: Normocephalic.     Right Ear: Tympanic membrane and external ear normal.     Left Ear: Tympanic membrane and external ear normal.     Nose: Nose normal. No congestion or rhinorrhea.     Mouth/Throat:     Mouth: Mucous membranes are moist.     Pharynx: No oropharyngeal exudate or posterior oropharyngeal erythema.  Eyes:     General: No scleral icterus.       Right eye: No discharge.        Left eye: No discharge.     Conjunctiva/sclera: Conjunctivae normal.     Pupils: Pupils are equal, round, and reactive to light.  Neck:     Thyroid: No thyromegaly.     Vascular: No JVD.     Trachea: No tracheal deviation.  Cardiovascular:     Rate and Rhythm: Normal rate and regular rhythm.     Heart sounds: Normal heart sounds. No murmur heard.    No friction rub. No gallop.  Pulmonary:     Effort: No respiratory  distress.     Breath sounds: Normal breath sounds. No wheezing or rales.  Abdominal:     General: Bowel sounds are normal.     Palpations: Abdomen is soft. There is no hepatomegaly, splenomegaly or mass.     Tenderness: There is no abdominal tenderness. There is no guarding or rebound.  Musculoskeletal:        General: No tenderness. Normal range of motion.     Cervical back: Neck supple.  Lymphadenopathy:     Cervical: No cervical adenopathy.  Skin:    General: Skin is warm.     Findings: No rash.  Neurological:     Mental Status: He is alert.     Wt Readings from Last 3 Encounters:  05/06/22 166 lb (75.3 kg)  11/03/21 168 lb (76.2 kg)  09/23/21 172 lb (78 kg)    BP 120/76   Pulse (!) 110   Ht '5\' 8"'$  (1.727 m)   Wt 166 lb (75.3 kg)   SpO2 95%   BMI 25.24 kg/m   Assessment and Plan: 1. Adult hypothyroidism Chronic.  Controlled.  Stable.  Asymptomatic for hypothyroid.  Continue levothyroxine 112 mcg daily.  Will recheck TSH and adjust accordingly. - levothyroxine (SYNTHROID) 112 MCG tablet; Take 1 tablet (112 mcg total) by mouth daily.  Dispense: 90 tablet; Refill: 1 - TSH  2. Type 2 diabetes mellitus without complication, without long-term current use of insulin (HCC) Chronic.  Controlled.  Stable.  Last A1c 7.0.  Continue metformin 500 mg once a day and will check A1c for microalbuminuria for current status of control. - metFORMIN (GLUCOPHAGE) 500 MG tablet; TAKE 1 TABLET BY MOUTH EVERY DAY WITH BREAKFAST  Dispense: 90 tablet; Refill: 1 - HgB A1c - Comprehensive Metabolic Panel (CMET) - Microalbumin / creatinine urine ratio  3. Gastroesophageal reflux disease, unspecified whether esophagitis present Chronic.  Controlled.  Stable.  Continue pantoprazole 40 mg once a day.  - pantoprazole (PROTONIX) 40 MG tablet; Take 1 tablet (40 mg total) by mouth daily.  Dispense: 90 tablet; Refill: 1  4. Atherosclerosis of aorta (HCC) Chronic.  Controlled.  Stable.  Atherosclerosis  involving the carotids peripheral vascular coronary and atherosclerosis of the aorta.  Have discussed again about cessation of smoking but will in the meantime continue Plavix 75 mg daily and control of cholesterol. - clopidogrel (PLAVIX) 75 MG tablet; Take 1 tablet (75 mg total) by mouth daily.  Dispense: 90 tablet; Refill: 1  5. Chronic anxiety Chronic.  Controlled.  Stable.  GAD score is 0.  Continue sertraline 100 mg daily.  Will recheck status in 6 months. - sertraline (ZOLOFT) 100 MG tablet; Take 1 tablet (100 mg total) by mouth daily.  Dispense: 90 tablet; Refill: 1  6. Recurrent major depressive disorder, in full remission ([place]) Chronic.  Controlled.  Stable.  PHQ score is 0.  Continue sertraline 100 mg once a day. - sertraline (ZOLOFT) 100 MG tablet; Take 1 tablet (100 mg total) by mouth daily.  Dispense: 90 tablet; Refill: 1  7. Non-seasonal allergic rhinitis due to pollen .  Controlled.  Stable.  Continue sertraline Zyrtec 10 mg and Flonase to be used on a daily basis. - cetirizine (ZYRTEC) 10 MG tablet; Take 1 tablet (10 mg total) by mouth daily.  Dispense: 90 tablet; Refill: 1  8. Hyperlipidemia, unspecified hyperlipidemia type Chronic.  Controlled.  Stable.  Currently is prescribed by cardiology rosuvastatin 40 mg daily and last LDL was noted to be 57. - Lipid Panel With  LDL/HDL Ratio  9. Tobacco use disorder Patient has been advised of the health risks of smoking and counseled concerning cessation of tobacco products.      Otilio Miu, MD

## 2022-05-07 LAB — COMPREHENSIVE METABOLIC PANEL
ALT: 29 IU/L (ref 0–44)
AST: 39 IU/L (ref 0–40)
Albumin/Globulin Ratio: 1.7 (ref 1.2–2.2)
Albumin: 4.7 g/dL (ref 3.8–4.8)
Alkaline Phosphatase: 100 IU/L (ref 44–121)
BUN/Creatinine Ratio: 31 — ABNORMAL HIGH (ref 10–24)
BUN: 23 mg/dL (ref 8–27)
Bilirubin Total: 0.4 mg/dL (ref 0.0–1.2)
CO2: 22 mmol/L (ref 20–29)
Calcium: 10.1 mg/dL (ref 8.6–10.2)
Chloride: 101 mmol/L (ref 96–106)
Creatinine, Ser: 0.74 mg/dL — ABNORMAL LOW (ref 0.76–1.27)
Globulin, Total: 2.7 g/dL (ref 1.5–4.5)
Glucose: 106 mg/dL — ABNORMAL HIGH (ref 70–99)
Potassium: 4.4 mmol/L (ref 3.5–5.2)
Sodium: 138 mmol/L (ref 134–144)
Total Protein: 7.4 g/dL (ref 6.0–8.5)
eGFR: 96 mL/min/{1.73_m2} (ref >59)

## 2022-05-07 LAB — HEMOGLOBIN A1C
Est. average glucose Bld gHb Est-mCnc: 146 mg/dL
Hgb A1c MFr Bld: 6.7 % — ABNORMAL HIGH (ref 4.8–5.6)

## 2022-05-07 LAB — LIPID PANEL WITH LDL/HDL RATIO
Cholesterol, Total: 121 mg/dL (ref 100–199)
HDL: 42 mg/dL (ref >39)
LDL Chol Calc (NIH): 50 mg/dL (ref 0–99)
LDL/HDL Ratio: 1.2 ratio (ref 0.0–3.6)
Triglycerides: 176 mg/dL — ABNORMAL HIGH (ref 0–149)
VLDL Cholesterol Cal: 29 mg/dL (ref 5–40)

## 2022-05-07 LAB — MICROALBUMIN / CREATININE URINE RATIO
Creatinine, Urine: 137.6 mg/dL
Microalb/Creat Ratio: 12 mg/g creat (ref 0–29)
Microalbumin, Urine: 16.1 ug/mL

## 2022-05-07 LAB — TSH: TSH: 4.89 u[IU]/mL — ABNORMAL HIGH (ref 0.450–4.500)

## 2022-05-17 IMAGING — CT CT CHEST LUNG CANCER SCREENING LOW DOSE W/O CM
2 of 5 series · 15 of 40 positions shown, 18 images · non-contrast
Comparison: 06/22/2019.

CLINICAL DATA: Current smoker, 78 pack-year history.

EXAM:
CT CHEST WITHOUT CONTRAST LOW-DOSE FOR LUNG CANCER SCREENING
TECHNIQUE: Multidetector CT imaging of the chest was performed following the
standard protocol without IV contrast.

[Series 3: lung 1.00 · axial · 0.81mm/px · z∈[-1157,-878]mm · 12 of 307 slices shown, 15 images]
[im 14/307  mediastinal]
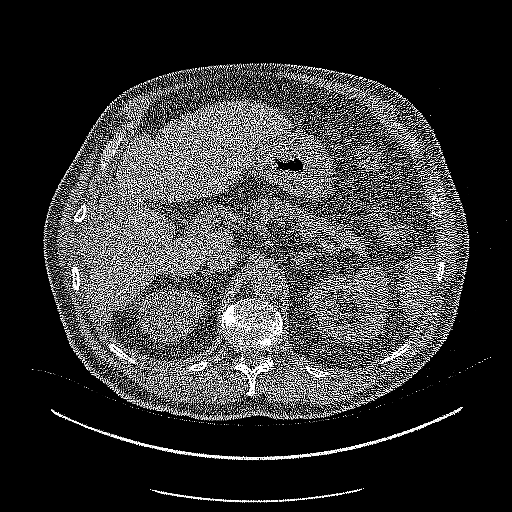
[im 14/307  lung]
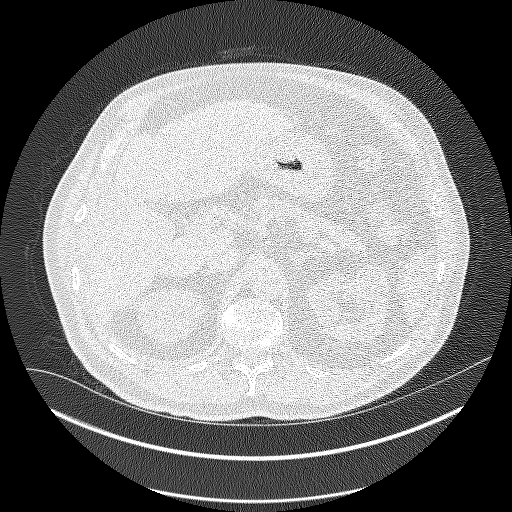
[im 42/307  lung]
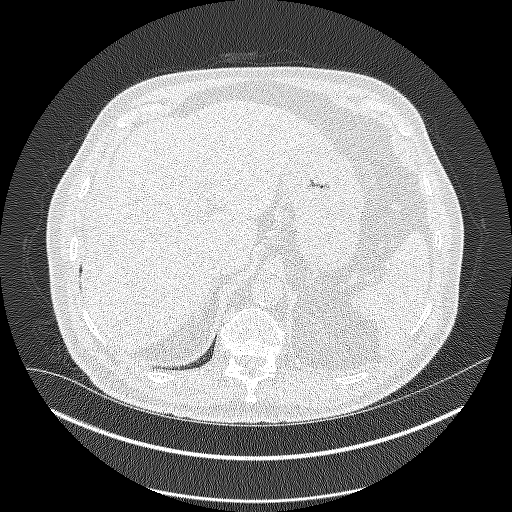
[im 70/307  lung]
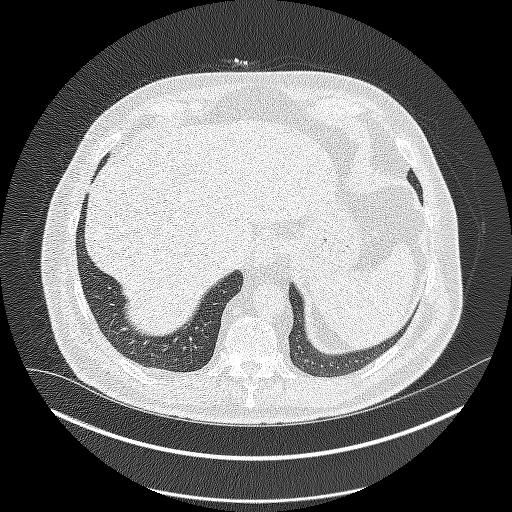
[im 98/307  lung]
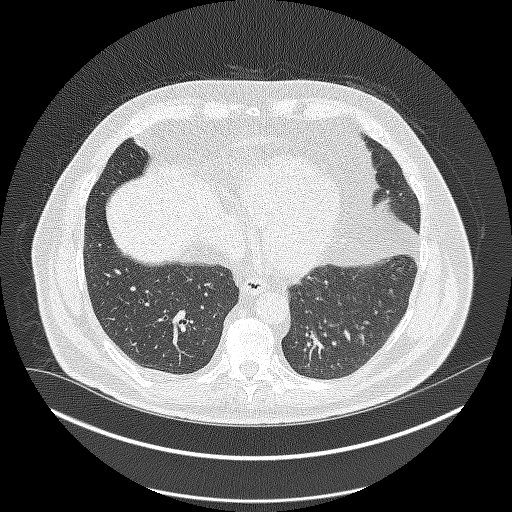
[im 112/307  mediastinal]
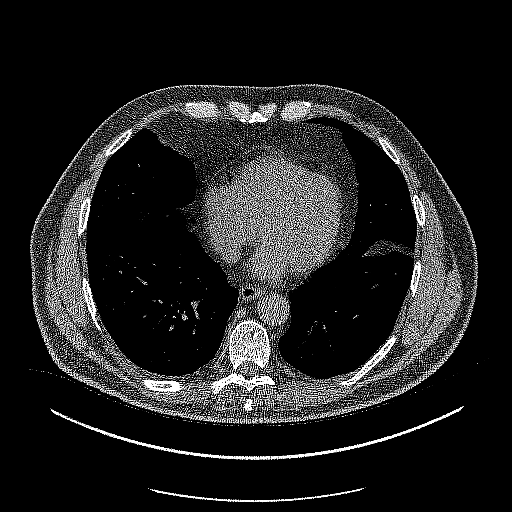
[im 112/307  lung]
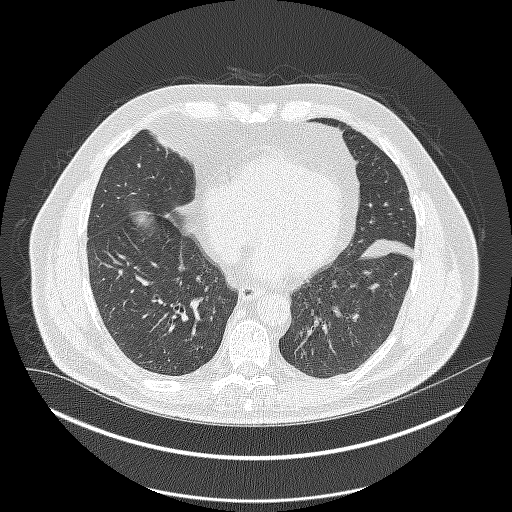
[im 140/307  lung]
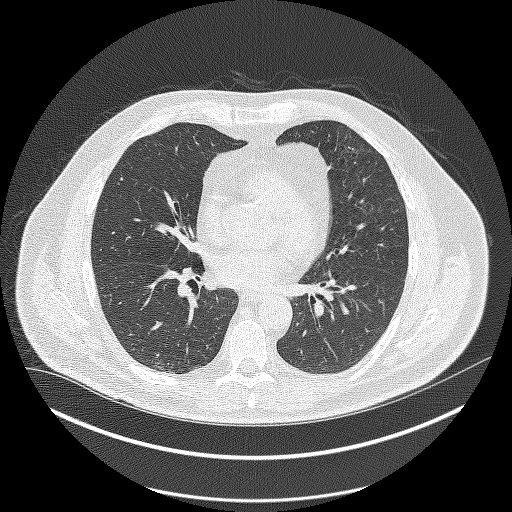
[im 167/307  lung]
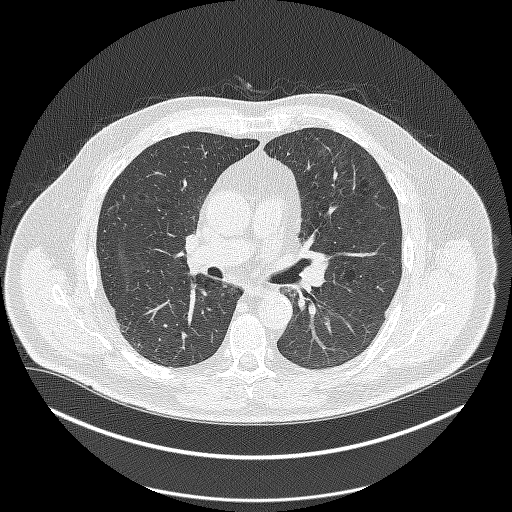
[im 195/307  lung]
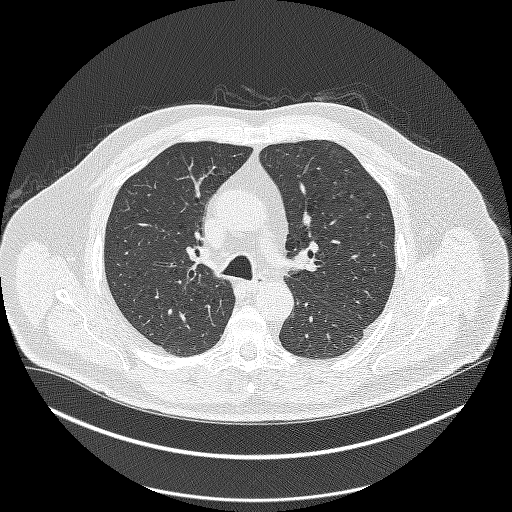
[im 209/307  mediastinal]
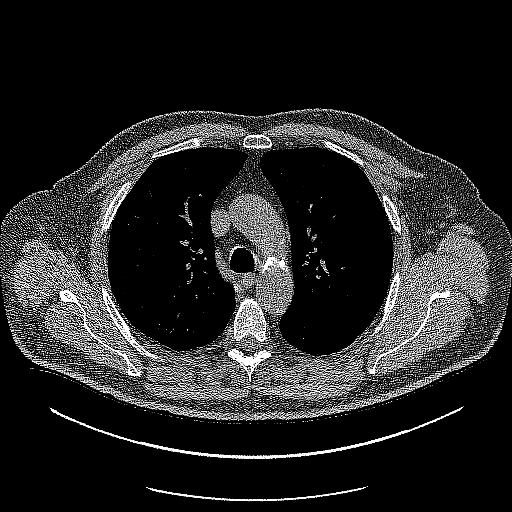
[im 209/307  lung]
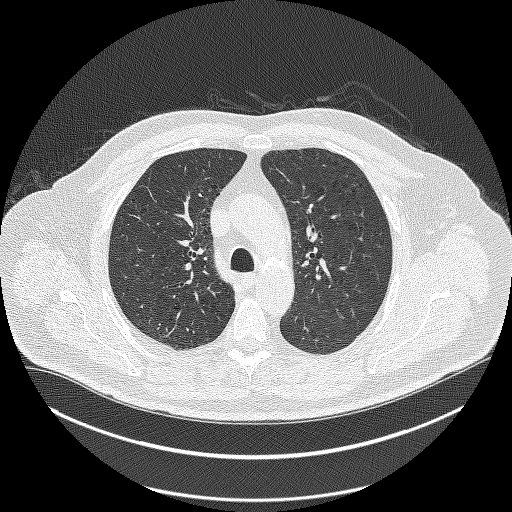
[im 237/307  lung]
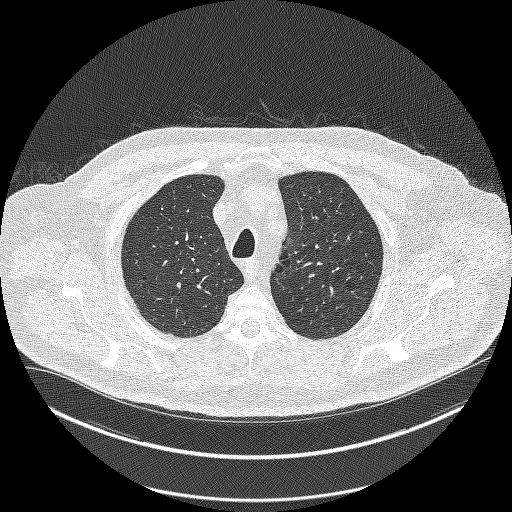
[im 265/307  lung]
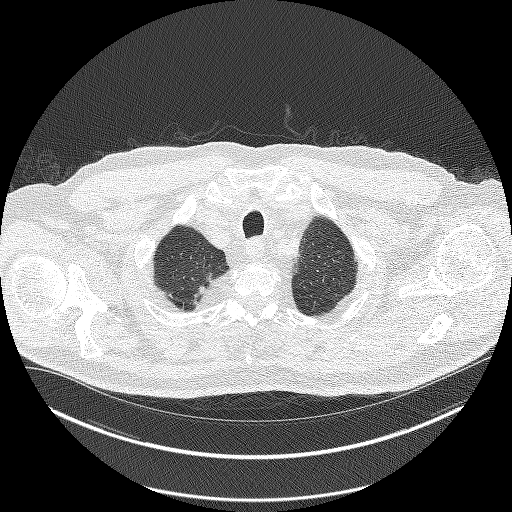
[im 293/307  lung]
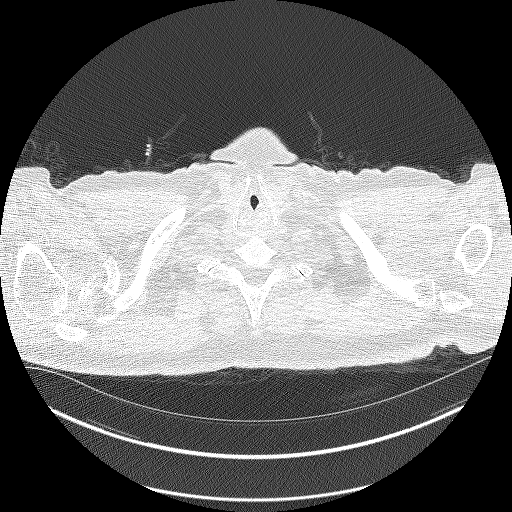

[Series 5: coronals lung 1.00 cor · coronal · 0.60mm/px · 3 of 350 slices shown]
[im 70/350  lung]
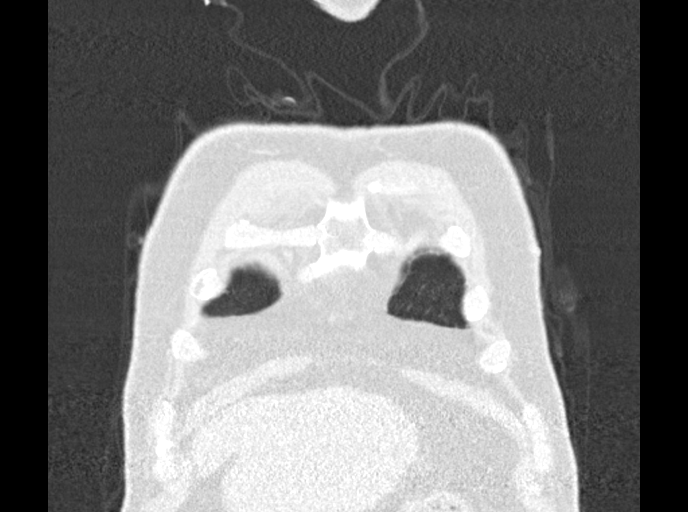
[im 140/350  lung]
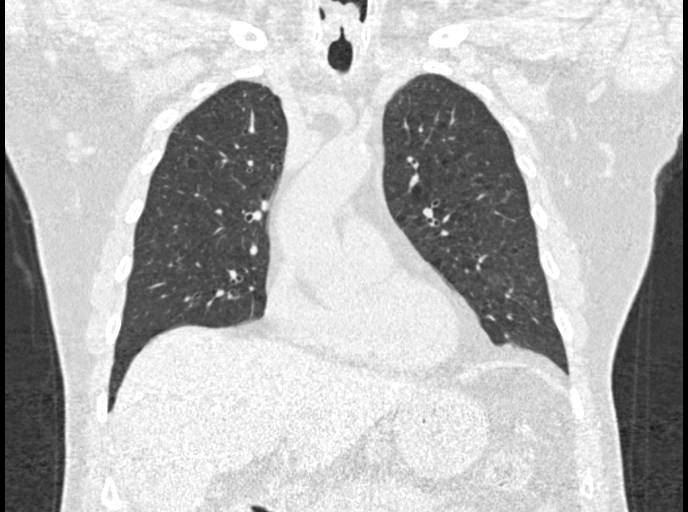
[im 210/350  lung]
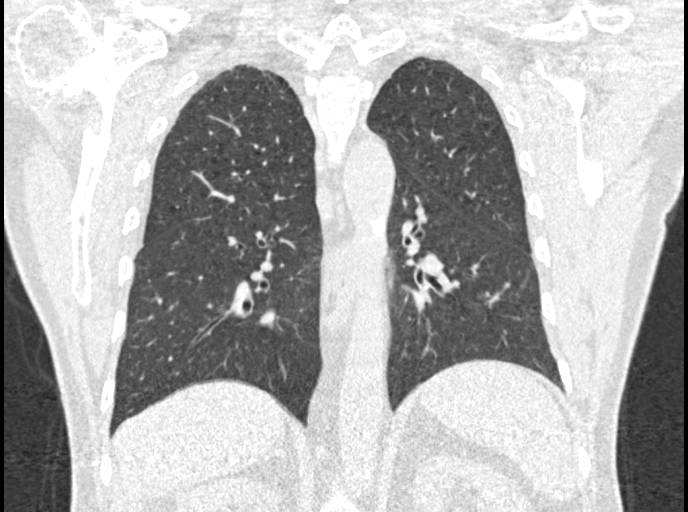

[15 of 40 positions shown; findings below may reference images not displayed]

FINDINGS: Cardiovascular: Atherosclerotic calcification of the aorta and
coronary arteries. Heart size normal. No pericardial effusion.

Mediastinum/Nodes: No pathologically enlarged mediastinal or
axillary lymph nodes. Prepericardiac lymph nodes are not enlarged by
CT size criteria. Hilar regions are difficult to definitively
evaluate without IV contrast but appear grossly unremarkable.
Esophagus is grossly unremarkable.

Lungs/Pleura: Biapical pleuroparenchymal scarring. Centrilobular and
paraseptal emphysema. Smoking related respiratory bronchiolitis.
Pulmonary nodules measure 5.7 mm or less in size, as before. No new
or suspicious pulmonary nodules. No pleural fluid. Airway is
unremarkable.

Upper Abdomen: Calcification and soft tissue thickening adjacent to
the inferior right hepatic lobe is similar to the prior exam but
incompletely imaged. Visualized portions of the liver, adrenal
glands, kidneys, spleen, pancreas, stomach and bowel are otherwise
unremarkable.

Musculoskeletal: Degenerative changes in the right shoulder. No
worrisome lytic or sclerotic lesions.
IMPRESSION: 1. Lung-RADS 2, benign appearance or behavior. Continue annual
screening with low-dose chest CT without contrast in 12 months.
2. Aortic atherosclerosis (0G8XG-4CO.O). Coronary artery
calcification.
3.  Emphysema (0G8XG-0MP.O).

## 2022-06-12 ENCOUNTER — Other Ambulatory Visit: Payer: Self-pay

## 2022-06-12 DIAGNOSIS — E039 Hypothyroidism, unspecified: Secondary | ICD-10-CM

## 2022-06-12 NOTE — Progress Notes (Signed)
Printed thyroid lab for recheck

## 2022-06-13 LAB — THYROID PANEL WITH TSH
Free Thyroxine Index: 2.6 (ref 1.2–4.9)
T3 Uptake Ratio: 29 % (ref 24–39)
T4, Total: 9 ug/dL (ref 4.5–12.0)
TSH: 4.86 u[IU]/mL — ABNORMAL HIGH (ref 0.450–4.500)

## 2022-06-15 ENCOUNTER — Telehealth: Payer: Self-pay

## 2022-06-15 NOTE — Telephone Encounter (Signed)
Called pt to give him his lab results. He brought up weight loss, so we went over his diabetes being a possible factor. It is too early to repeat his A1C- can repeat at the end of Feb. Also went over pt needing to come in for visit to see if there is something else going on such as cancer. He doesn't want an appt right now, he wants to wait another month.

## 2022-07-15 ENCOUNTER — Other Ambulatory Visit (INDEPENDENT_AMBULATORY_CARE_PROVIDER_SITE_OTHER): Payer: Self-pay | Admitting: Nurse Practitioner

## 2022-07-15 DIAGNOSIS — I6523 Occlusion and stenosis of bilateral carotid arteries: Secondary | ICD-10-CM

## 2022-07-15 DIAGNOSIS — Z9889 Other specified postprocedural states: Secondary | ICD-10-CM

## 2022-07-21 ENCOUNTER — Ambulatory Visit (INDEPENDENT_AMBULATORY_CARE_PROVIDER_SITE_OTHER): Payer: Medicare PPO | Admitting: Nurse Practitioner

## 2022-07-21 ENCOUNTER — Encounter (INDEPENDENT_AMBULATORY_CARE_PROVIDER_SITE_OTHER): Payer: Self-pay | Admitting: Nurse Practitioner

## 2022-07-21 ENCOUNTER — Ambulatory Visit (INDEPENDENT_AMBULATORY_CARE_PROVIDER_SITE_OTHER): Payer: Medicare PPO

## 2022-07-21 VITALS — BP 126/79 | HR 84 | Resp 18 | Ht 68.0 in | Wt 165.2 lb

## 2022-07-21 DIAGNOSIS — Z9889 Other specified postprocedural states: Secondary | ICD-10-CM

## 2022-07-21 DIAGNOSIS — I10 Essential (primary) hypertension: Secondary | ICD-10-CM

## 2022-07-21 DIAGNOSIS — R202 Paresthesia of skin: Secondary | ICD-10-CM | POA: Diagnosis not present

## 2022-07-21 DIAGNOSIS — I739 Peripheral vascular disease, unspecified: Secondary | ICD-10-CM

## 2022-07-21 DIAGNOSIS — I6523 Occlusion and stenosis of bilateral carotid arteries: Secondary | ICD-10-CM | POA: Diagnosis not present

## 2022-07-21 DIAGNOSIS — R200 Anesthesia of skin: Secondary | ICD-10-CM

## 2022-07-21 NOTE — Progress Notes (Incomplete)
Subjective:    Patient ID: Joel Flores, male    DOB: 10/30/1950, 72 y.o.   MRN: [id number] Chief Complaint  Patient presents with  . Follow-up    Follow up 2yr carotid/abi.    HPI  Review of Systems     Objective:   Physical Exam  BP 126/79 (BP Location: Left Arm)   Pulse 84   Resp 18   Ht 5\' 8"  (1.727 m)   Wt 165 lb 3.2 oz (74.9 kg)   BMI 25.12 kg/m   Past Medical History:  Diagnosis Date  . Allergy 1990  . Anxiety    PANIC ATTACKS  . Arthritis    right shoulder  . Atherosclerosis of aorta ([place]) 02/12/2015  . Cataract   . COPD (chronic obstructive pulmonary disease) ([place])   . Cough   . Depression   . Environmental and seasonal allergies   . Essential hypertension 02/12/2015  . Family history of adverse reaction to anesthesia    Father - 25 - MI, then stroke after Prostate surgery  . GERD (gastroesophageal reflux disease)   . History of hiatal hernia   . Hyperlipidemia   . Hypertension   . Hypothyroidism   . Incisional hernia, without obstruction or gangrene   . PVD (peripheral vascular disease) ([place]) 11/17/2016  . Thyroid disease   . Venous insufficiency   . Vertigo    can happen daily  . Wears hearing aid in both ears    has, does not wear    Social History   Socioeconomic History  . Marital status: Married    Spouse name: Not on file  . Number of children: 3  . Years of education: Not on file  . Highest education level: 12th grade  Occupational History  . Occupation: retired  Tobacco Use  . Smoking status: Every Day    Packs/day: 1.50    Years: 50.00    Additional pack years: 0.00    Total pack years: 75.00    Types: Cigarettes  . Smokeless tobacco: Never  Vaping Use  . Vaping Use: Never used  Substance and Sexual Activity  . Alcohol use: Yes    Comment: once a month  . Drug use: No  . Sexual activity: Not Currently  Other Topics Concern  . Not on file  Social History Narrative  . Not on file   Social Determinants of Health    Financial Resource Strain: Low Risk  (08/25/2021)   Overall Financial Resource Strain (CARDIA)   . Difficulty of Paying Living Expenses: Not hard at all  Food Insecurity: No Food Insecurity (08/25/2021)   Hunger Vital Sign   . Worried About Charity fundraiser in the Last Year: Never true   . Ran Out of Food in the Last Year: Never true  Transportation Needs: No Transportation Needs (08/25/2021)   PRAPARE - Transportation   . Lack of Transportation (Medical): No   . Lack of Transportation (Non-Medical): No  Physical Activity: Inactive (08/25/2021)   Exercise Vital Sign   . Days of Exercise per Week: 0 days   . Minutes of Exercise per Session: 0 min  Stress: No Stress Concern Present (08/25/2021)   [place]   . Feeling of Stress : Not at all  Social Connections: Moderately Isolated (08/25/2021)   Social Connection and Isolation Panel [NHANES]   . Frequency of Communication with Friends and Family: More than three times a week   .  Frequency of Social Gatherings with Friends and Family: Once a week   . Attends Religious Services: Never   . Active Member of Clubs or Organizations: No   . Attends Archivist Meetings: Never   . Marital Status: Married  Human resources officer Violence: Not At Risk (08/25/2021)   Humiliation, Afraid, Rape, and Kick questionnaire   . Fear of Current or Ex-Partner: No   . Emotionally Abused: No   . Physically Abused: No   . Sexually Abused: No    Past Surgical History:  Procedure Laterality Date  . CATARACT EXTRACTION W/PHACO Right 03/16/2018   Procedure: CATARACT EXTRACTION PHACO AND INTRAOCULAR LENS PLACEMENT (IOC);  Surgeon: Marchia Meiers, MD;  Location: [facility];  Service: Ophthalmology;  Laterality: Right;  [place]  01:05 CDE 11.85 Fluid pack lot # [id number]  . CATARACT EXTRACTION W/PHACO Left 05/08/2020   Procedure: CATARACT EXTRACTION PHACO AND INTRAOCULAR LENS PLACEMENT (IOC) LEFT 7.32  00:54.7 13.4%;  Surgeon: Leandrew Koyanagi, MD;  Location: [place];  Service: Ophthalmology;  Laterality: Left;  . CHOLECYSTECTOMY    . COLONOSCOPY  2015   Dr Allen Norris- cleared for 5 years   . COLONOSCOPY WITH PROPOFOL N/A 01/17/2018   Procedure: COLONOSCOPY WITH PROPOFOL;  Surgeon: Lucilla Lame, MD;  Location: [place];  Service: Endoscopy;  Laterality: N/A;  requests early  . HERNIA REPAIR  July 2019  . INSERTION OF MESH N/A 08/31/2017   Procedure: INSERTION OF MESH;  Surgeon: Vickie Epley, MD;  Location: [facility];  Service: General;  Laterality: N/A;  . POLYPECTOMY  01/17/2018   Procedure: POLYPECTOMY;  Surgeon: Lucilla Lame, MD;  Location: [place];  Service: Endoscopy;;  . UMBILICAL HERNIA REPAIR N/A 08/31/2017   Procedure: HERNIA REPAIR UMBILICAL ADULT;  Surgeon: Vickie Epley, MD;  Location: [facility];  Service: General;  Laterality: N/A;  . VEIN SURGERY     2 stents in R) leg    Family History  Problem Relation Age of Onset  . Diabetes Mother   . Cancer Father   . Heart disease Father   . Stroke Father     Allergies  Allergen Reactions  . Sulfa Antibiotics Rash       Latest Ref Rng & Units 11/11/2018   10:12 AM 07/27/2017    9:42 AM 11/02/2016    9:50 AM  CBC  WBC 3.4 - 10.8 x10E3/uL 8.5  8.3  14.0   Hemoglobin 13.0 - 17.7 g/dL 15.1  13.7  13.3   Hematocrit 37.5 - 51.0 % 44.1  41.1  38.6   Platelets 150 - 450 x10E3/uL 322  413  326       CMP     Component Value Date/Time   NA 138 05/06/2022 0918   K 4.4 05/06/2022 0918   CL 101 05/06/2022 0918   CO2 22 05/06/2022 0918   GLUCOSE 106 (H) 05/06/2022 0918   GLUCOSE 130 (H) 10/30/2016 0904   BUN 23 05/06/2022 0918   CREATININE 0.74 (L) 05/06/2022 0918   CALCIUM 10.1 05/06/2022 0918   PROT 7.4 05/06/2022 0918   ALBUMIN 4.7 05/06/2022 0918   AST 39 05/06/2022 0918   ALT 29 05/06/2022 0918   ALKPHOS 100 05/06/2022 0918   BILITOT 0.4 05/06/2022 0918   GFRNONAA 92  05/20/2020 1520   GFRAA 106 05/20/2020 1520     No results found.     Assessment & Plan:   1. Bilateral carotid artery stenosis ***  2. Peripheral arterial disease with  history of revascularization (HCC) ***  3. Essential hypertension Continue antihypertensive medications as already ordered, these medications have been reviewed and there are no changes at this time.   Current Outpatient Medications on File Prior to Visit  Medication Sig Dispense Refill  . acetaminophen (TYLENOL) 325 MG tablet Take 325 mg by mouth daily as needed for moderate pain or headache.    . albuterol (VENTOLIN HFA) 108 (90 Base) MCG/ACT inhaler Inhale into the lungs. Inhale 2 inhalations into the lungs 4 (four) times daily PRN    . amLODipine (NORVASC) 5 MG tablet Take 5 mg by mouth daily.    Marland Kitchen azelastine (ASTELIN) 0.1 % nasal spray Place 2 sprays into both nostrils 2 (two) times daily. Use in each nostril as directed 30 mL 12  . cetirizine (ZYRTEC) 10 MG tablet Take 1 tablet (10 mg total) by mouth daily. 90 tablet 1  . clopidogrel (PLAVIX) 75 MG tablet Take 1 tablet (75 mg total) by mouth daily. 90 tablet 1  . fluticasone (FLONASE) 50 MCG/ACT nasal spray SPRAY 2 SPRAYS INTO EACH NOSTRIL EVERY DAY 48 mL 11  . ibuprofen (ADVIL,MOTRIN) 200 MG tablet Take 400 mg by mouth every 8 (eight) hours as needed (for pain).    Marland Kitchen levothyroxine (SYNTHROID) 112 MCG tablet Take 1 tablet (112 mcg total) by mouth daily. 90 tablet 1  . losartan (COZAAR) 100 MG tablet Take 100 mg by mouth daily.    . metFORMIN (GLUCOPHAGE) 500 MG tablet TAKE 1 TABLET BY MOUTH EVERY DAY WITH BREAKFAST 90 tablet 1  . pantoprazole (PROTONIX) 40 MG tablet Take 1 tablet (40 mg total) by mouth daily. 90 tablet 1  . rosuvastatin (CRESTOR) 40 MG tablet Take 40 mg by mouth daily.    . sertraline (ZOLOFT) 100 MG tablet Take 1 tablet (100 mg total) by mouth daily. 90 tablet 1   No current facility-administered medications on file prior to visit.     There are no Patient Instructions on file for this visit. No follow-ups on file.   Kris Hartmann, NP

## 2022-07-21 NOTE — Progress Notes (Signed)
Subjective:    Patient ID: Joel Flores, male    DOB: 07-Aug-1950, 72 y.o.   MRN: [id number] Chief Complaint  Patient presents with   Follow-up    Follow up 20yr carotid/abi.    Patient returns today in follow up of PAD.  He is a little over a decade status post right iliac stent placement for short distance claudication.  He is doing well with no current claudication, ischemic rest pain, or tissue loss.  His ABIs are normal at 1.08 on the right and 1.03 on the left.  Patient has multiphasic tibial vessel waveforms bilaterally with good toe waveforms bilaterally.  The patient also had bilateral carotid duplexes done which show 1 to 39% stenosis with antegrade flow in the bilateral vertebral arteries and normal flow hemodynamics in the bilateral subclavian arteries.    Review of Systems  Musculoskeletal:  Positive for arthralgias.  All other systems reviewed and are negative.      Objective:   Physical Exam Vitals reviewed.  HENT:     Head: Normocephalic.  Neck:     Vascular: No carotid bruit.  Cardiovascular:     Rate and Rhythm: Normal rate.     Pulses:          Dorsalis pedis pulses are detected w/ Doppler on the right side and detected w/ Doppler on the left side.       Posterior tibial pulses are detected w/ Doppler on the right side and detected w/ Doppler on the left side.  Pulmonary:     Effort: Pulmonary effort is normal.  Skin:    General: Skin is warm and dry.  Neurological:     Mental Status: He is alert and oriented to person, place, and time.  Psychiatric:        Mood and Affect: Mood normal.        Behavior: Behavior normal.        Thought Content: Thought content normal.        Judgment: Judgment normal.     BP 126/79 (BP Location: Left Arm)   Pulse 84   Resp 18   Ht 5\' 8"  (1.727 m)   Wt 165 lb 3.2 oz (74.9 kg)   BMI 25.12 kg/m   Past Medical History:  Diagnosis Date   Allergy 1990   Anxiety    PANIC ATTACKS   Arthritis    right shoulder    Atherosclerosis of aorta ([place]) 02/12/2015   Cataract    COPD (chronic obstructive pulmonary disease) (HCC)    Cough    Depression    Environmental and seasonal allergies    Essential hypertension 02/12/2015   Family history of adverse reaction to anesthesia    Father - 22 - MI, then stroke after Prostate surgery   GERD (gastroesophageal reflux disease)    History of hiatal hernia    Hyperlipidemia    Hypertension    Hypothyroidism    Incisional hernia, without obstruction or gangrene    PVD (peripheral vascular disease) ([place]) 11/17/2016   Thyroid disease    Venous insufficiency    Vertigo    can happen daily   Wears hearing aid in both ears    has, does not wear    Social History   Socioeconomic History   Marital status: Married    Spouse name: Not on file   Number of children: 3   Years of education: Not on file   Highest education level: 12th grade  Occupational History  Occupation: retired  Tobacco Use   Smoking status: Every Day    Packs/day: 1.50    Years: 50.00    Additional pack years: 0.00    Total pack years: 75.00    Types: Cigarettes   Smokeless tobacco: Never  Vaping Use   Vaping Use: Never used  Substance and Sexual Activity   Alcohol use: Yes    Comment: once a month   Drug use: No   Sexual activity: Not Currently  Other Topics Concern   Not on file  Social History Narrative   Not on file   Social Determinants of Health   Financial Resource Strain: Low Risk  (08/25/2021)   Overall Financial Resource Strain (CARDIA)    Difficulty of Paying Living Expenses: Not hard at all  Food Insecurity: No Food Insecurity (08/25/2021)   Hunger Vital Sign    Worried About Running Out of Food in the Last Year: Never true    Ran Out of Food in the Last Year: Never true  Transportation Needs: No Transportation Needs (08/25/2021)   PRAPARE - Hydrologist (Medical): No    Lack of Transportation (Non-Medical): No  Physical Activity:  Inactive (08/25/2021)   Exercise Vital Sign    Days of Exercise per Week: 0 days    Minutes of Exercise per Session: 0 min  Stress: No Stress Concern Present (08/25/2021)   [place]    Feeling of Stress : Not at all  Social Connections: Moderately Isolated (08/25/2021)   Social Connection and Isolation Panel [NHANES]    Frequency of Communication with Friends and Family: More than three times a week    Frequency of Social Gatherings with Friends and Family: Once a week    Attends Religious Services: Never    Marine scientist or Organizations: No    Attends Archivist Meetings: Never    Marital Status: Married  Human resources officer Violence: Not At Risk (08/25/2021)   Humiliation, Afraid, Rape, and Kick questionnaire    Fear of Current or Ex-Partner: No    Emotionally Abused: No    Physically Abused: No    Sexually Abused: No    Past Surgical History:  Procedure Laterality Date   CATARACT EXTRACTION W/PHACO Right 03/16/2018   Procedure: CATARACT EXTRACTION PHACO AND INTRAOCULAR LENS PLACEMENT ([place]);  Surgeon: Marchia Meiers, MD;  Location: [facility];  Service: Ophthalmology;  Laterality: Right;  [place]  01:05 CDE 11.85 Fluid pack lot # [id number]   CATARACT EXTRACTION W/PHACO Left 05/08/2020   Procedure: CATARACT EXTRACTION PHACO AND INTRAOCULAR LENS PLACEMENT (IOC) LEFT 7.32 00:54.7 13.4%;  Surgeon: Leandrew Koyanagi, MD;  Location: [place];  Service: Ophthalmology;  Laterality: Left;   CHOLECYSTECTOMY     COLONOSCOPY  2015   Dr Allen Norris- cleared for 5 years    COLONOSCOPY WITH PROPOFOL N/A 01/17/2018   Procedure: COLONOSCOPY WITH PROPOFOL;  Surgeon: Lucilla Lame, MD;  Location: [place];  Service: Endoscopy;  Laterality: N/A;  requests early   HERNIA REPAIR  July 2019   INSERTION OF MESH N/A 08/31/2017   Procedure: INSERTION OF MESH;  Surgeon: Vickie Epley, MD;  Location: [facility];   Service: General;  Laterality: N/A;   POLYPECTOMY  01/17/2018   Procedure: POLYPECTOMY;  Surgeon: Lucilla Lame, MD;  Location: [place];  Service: Endoscopy;;   UMBILICAL HERNIA REPAIR N/A 08/31/2017   Procedure: HERNIA REPAIR UMBILICAL ADULT;  Surgeon: Tama High  Ellard Artis, MD;  Location: [facility];  Service: General;  Laterality: N/A;   VEIN SURGERY     2 stents in R) leg    Family History  Problem Relation Age of Onset   Diabetes Mother    Cancer Father    Heart disease Father    Stroke Father     Allergies  Allergen Reactions   Sulfa Antibiotics Rash       Latest Ref Rng & Units 11/11/2018   10:12 AM 07/27/2017    9:42 AM 11/02/2016    9:50 AM  CBC  WBC 3.4 - 10.8 x10E3/uL 8.5  8.3  14.0   Hemoglobin 13.0 - 17.7 g/dL 15.1  13.7  13.3   Hematocrit 37.5 - 51.0 % 44.1  41.1  38.6   Platelets 150 - 450 x10E3/uL 322  413  326       CMP     Component Value Date/Time   NA 138 05/06/2022 0918   K 4.4 05/06/2022 0918   CL 101 05/06/2022 0918   CO2 22 05/06/2022 0918   GLUCOSE 106 (H) 05/06/2022 0918   GLUCOSE 130 (H) 10/30/2016 0904   BUN 23 05/06/2022 0918   CREATININE 0.74 (L) 05/06/2022 0918   CALCIUM 10.1 05/06/2022 0918   PROT 7.4 05/06/2022 0918   ALBUMIN 4.7 05/06/2022 0918   AST 39 05/06/2022 0918   ALT 29 05/06/2022 0918   ALKPHOS 100 05/06/2022 0918   BILITOT 0.4 05/06/2022 0918   GFRNONAA 92 05/20/2020 1520   GFRAA 106 05/20/2020 1520     No results found.     Assessment & Plan:   1. Bilateral carotid artery stenosis Recommend:  Given the patient's asymptomatic subcritical stenosis no further invasive testing or surgery at this time.  Duplex ultrasound shows 1-39% stenosis bilaterally.  Continue antiplatelet therapy as prescribed Continue management of CAD, HTN and Hyperlipidemia Healthy heart diet,  encouraged exercise at least 4 times per week Follow up in 12 months with duplex ultrasound and physical exam   2. Peripheral arterial  disease with history of revascularization (HCC)  Recommend:  The patient has evidence of atherosclerosis of the lower extremities with no claudication.  The patient does not voice lifestyle limiting changes at this point in time.  Noninvasive studies do not suggest clinically significant change.  No invasive studies, angiography or surgery at this time The patient should continue walking and begin a more formal exercise program.  The patient should continue antiplatelet therapy and aggressive treatment of the lipid abnormalities  No changes in the patient's medications at this time  Continued surveillance is indicated as atherosclerosis is likely to progress with time.    The patient will continue follow up with noninvasive studies as ordered.   3. Essential hypertension Continue antihypertensive medications as already ordered, these medications have been reviewed and there are no changes at this time.   4. Numbness and tingling of right arm Based on the patient's description of the numbness and tingling in his arm I suspect that it is more so related to musculoskeletal causes such as a possible pinched nerve.  We also discussed the evaluation for this which would be an arterial duplex of his right arm.  The patient notes at this time the issue is not significant and so we will not schedule follow-up visit.  Patient advised that if it becomes more consistent or if the pain begins to occur with activity that we should evaluate more closely.  Otherwise, the patient has  strong pulses and no clear cause for concern at the moment.  Current Outpatient Medications on File Prior to Visit  Medication Sig Dispense Refill   acetaminophen (TYLENOL) 325 MG tablet Take 325 mg by mouth daily as needed for moderate pain or headache.     albuterol (VENTOLIN HFA) 108 (90 Base) MCG/ACT inhaler Inhale into the lungs. Inhale 2 inhalations into the lungs 4 (four) times daily PRN     amLODipine (NORVASC) 5 MG  tablet Take 5 mg by mouth daily.     azelastine (ASTELIN) 0.1 % nasal spray Place 2 sprays into both nostrils 2 (two) times daily. Use in each nostril as directed 30 mL 12   cetirizine (ZYRTEC) 10 MG tablet Take 1 tablet (10 mg total) by mouth daily. 90 tablet 1   clopidogrel (PLAVIX) 75 MG tablet Take 1 tablet (75 mg total) by mouth daily. 90 tablet 1   fluticasone (FLONASE) 50 MCG/ACT nasal spray SPRAY 2 SPRAYS INTO EACH NOSTRIL EVERY DAY 48 mL 11   ibuprofen (ADVIL,MOTRIN) 200 MG tablet Take 400 mg by mouth every 8 (eight) hours as needed (for pain).     levothyroxine (SYNTHROID) 112 MCG tablet Take 1 tablet (112 mcg total) by mouth daily. 90 tablet 1   losartan (COZAAR) 100 MG tablet Take 100 mg by mouth daily.     metFORMIN (GLUCOPHAGE) 500 MG tablet TAKE 1 TABLET BY MOUTH EVERY DAY WITH BREAKFAST 90 tablet 1   pantoprazole (PROTONIX) 40 MG tablet Take 1 tablet (40 mg total) by mouth daily. 90 tablet 1   rosuvastatin (CRESTOR) 40 MG tablet Take 40 mg by mouth daily.     sertraline (ZOLOFT) 100 MG tablet Take 1 tablet (100 mg total) by mouth daily. 90 tablet 1   No current facility-administered medications on file prior to visit.    There are no Patient Instructions on file for this visit. No follow-ups on file.   Kris Hartmann, NP

## 2022-07-22 LAB — VAS US ABI WITH/WO TBI
Left ABI: 1.03
Right ABI: 1.08

## 2022-08-12 ENCOUNTER — Telehealth: Payer: Self-pay | Admitting: Family Medicine

## 2022-08-12 NOTE — Telephone Encounter (Signed)
Contacted Lissa Merlin Ingham to schedule their annual wellness visit. Patient declined to schedule AWV at this time. Patient stated he did it last year and do not want to do it anymore a waste of time  Verlee Rossetti; Care Guide Ambulatory Clinical Support [place] [facility] Direct Dial: [phone]

## 2022-08-25 ENCOUNTER — Other Ambulatory Visit: Payer: Self-pay | Admitting: Acute Care

## 2022-08-25 DIAGNOSIS — F17210 Nicotine dependence, cigarettes, uncomplicated: Secondary | ICD-10-CM

## 2022-08-25 DIAGNOSIS — Z87891 Personal history of nicotine dependence: Secondary | ICD-10-CM

## 2022-08-25 DIAGNOSIS — Z122 Encounter for screening for malignant neoplasm of respiratory organs: Secondary | ICD-10-CM

## 2022-09-04 ENCOUNTER — Ambulatory Visit (INDEPENDENT_AMBULATORY_CARE_PROVIDER_SITE_OTHER): Payer: Medicare PPO

## 2022-09-04 ENCOUNTER — Ambulatory Visit: Payer: Medicare PPO | Admitting: Family Medicine

## 2022-09-04 ENCOUNTER — Encounter: Payer: Self-pay | Admitting: Family Medicine

## 2022-09-04 VITALS — BP 134/84 | HR 120 | Ht 68.0 in | Wt 158.0 lb

## 2022-09-04 DIAGNOSIS — Z0000 Encounter for general adult medical examination without abnormal findings: Secondary | ICD-10-CM | POA: Diagnosis not present

## 2022-09-04 DIAGNOSIS — Z7984 Long term (current) use of oral hypoglycemic drugs: Secondary | ICD-10-CM | POA: Diagnosis not present

## 2022-09-04 DIAGNOSIS — E119 Type 2 diabetes mellitus without complications: Secondary | ICD-10-CM

## 2022-09-04 MED ORDER — METFORMIN HCL 500 MG PO TABS: ORAL_TABLET | ORAL | 1 refills | Status: DC

## 2022-09-04 NOTE — Progress Notes (Signed)
Date:  09/04/2022   Name:  Joel Flores   DOB:  Nov 04, 1950   MRN:  [id number]   Chief Complaint: Diabetes  Diabetes He presents for his follow-up diabetic visit. He has type 2 diabetes mellitus. His disease course has been stable. There are no hypoglycemic associated symptoms. Pertinent negatives for diabetes include no blurred vision, no chest pain, no fatigue, no polydipsia, no polyphagia and no polyuria. There are no hypoglycemic complications. Symptoms are stable. There are no diabetic complications. Risk factors for coronary artery disease include dyslipidemia and hypertension. He is following a generally healthy diet. Meal planning includes avoidance of concentrated sweets and carbohydrate counting. An ACE inhibitor/angiotensin II receptor blocker is being taken.    Lab Results  Component Value Date   NA 138 05/06/2022   K 4.4 05/06/2022   CO2 22 05/06/2022   GLUCOSE 106 (H) 05/06/2022   BUN 23 05/06/2022   CREATININE 0.74 (L) 05/06/2022   CALCIUM 10.1 05/06/2022   EGFR 96 05/06/2022   GFRNONAA 92 05/20/2020   Lab Results  Component Value Date   CHOL 121 05/06/2022   HDL 42 05/06/2022   LDLCALC 50 05/06/2022   TRIG 176 (H) 05/06/2022   CHOLHDL 3.7 06/17/2018   Lab Results  Component Value Date   TSH 4.860 (H) 06/12/2022   Lab Results  Component Value Date   HGBA1C 6.7 (H) 05/06/2022   Lab Results  Component Value Date   WBC 8.5 11/11/2018   HGB 15.1 11/11/2018   HCT 44.1 11/11/2018   MCV 86 11/11/2018   PLT 322 11/11/2018   Lab Results  Component Value Date   ALT 29 05/06/2022   AST 39 05/06/2022   ALKPHOS 100 05/06/2022   BILITOT 0.4 05/06/2022   No results found for: "25OHVITD2", "25OHVITD3", "VD25OH"   Review of Systems  Constitutional:  Negative for fatigue.  HENT:  Negative for trouble swallowing.   Eyes:  Negative for blurred vision and visual disturbance.  Respiratory:  Positive for shortness of breath. Negative for chest tightness and  wheezing.   Cardiovascular:  Negative for chest pain, palpitations and leg swelling.       DOE  Gastrointestinal:  Negative for abdominal distention.  Endocrine: Negative for polydipsia, polyphagia and polyuria.    Patient Active Problem List   Diagnosis Date Noted   Bilateral carotid artery stenosis 09/20/2019   Colon cancer screening    Polyp of sigmoid colon    SOBOE (shortness of breath on exertion) 07/30/2017   PVD (peripheral vascular disease) (HCC) 11/17/2016   Tobacco use disorder 11/17/2016   Reactive airway disease, mild intermittent, uncomplicated 08/12/2016   Allergic rhinitis due to pollen 08/13/2015   Nocturia 08/13/2015   Atherosclerosis of aorta (HCC) 02/12/2015   Essential hypertension 02/12/2015   Esophageal reflux 02/12/2015   Depression 02/12/2015   Adult hypothyroidism 02/12/2015   Chronic anxiety 02/12/2015   Hyperlipemia 02/12/2015    Allergies  Allergen Reactions   Sulfa Antibiotics Rash    Past Surgical History:  Procedure Laterality Date   CATARACT EXTRACTION W/PHACO Right 03/16/2018   Procedure: CATARACT EXTRACTION PHACO AND INTRAOCULAR LENS PLACEMENT (IOC);  Surgeon: Elliot Cousin, MD;  Location: [facility] OR[facility];  Service: Ophthalmology;  Laterality: Right;  [place]  01:05 CDE 11.85 Fluid pack lot # [id number]   CATARACT EXTRACTION W/PHACO Left 05/08/2020   Procedure: CATARACT EXTRACTION PHACO AND INTRAOCULAR LENS PLACEMENT (IOC) LEFT 7.32 00:54.7 13.4%;  Surgeon: Lockie Mola, MD;  Location: [facility];  Service:  Ophthalmology;  Laterality: Left;   CHOLECYSTECTOMY     COLONOSCOPY  2015   Dr Servando Snare- cleared for 5 years    COLONOSCOPY WITH PROPOFOL N/A 01/17/2018   Procedure: COLONOSCOPY WITH PROPOFOL;  Surgeon: Midge Minium, MD;  Location: [facility];  Service: Endoscopy;  Laterality: N/A;  requests early   HERNIA REPAIR  July 2019   INSERTION OF MESH N/A 08/31/2017   Procedure: INSERTION OF MESH;  Surgeon: Ancil Linsey, MD;   Location: [facility] OR[facility];  Service: General;  Laterality: N/A;   POLYPECTOMY  01/17/2018   Procedure: POLYPECTOMY;  Surgeon: Midge Minium, MD;  Location: [facility];  Service: Endoscopy;;   UMBILICAL HERNIA REPAIR N/A 08/31/2017   Procedure: HERNIA REPAIR UMBILICAL ADULT;  Surgeon: Ancil Linsey, MD;  Location: [facility];  Service: General;  Laterality: N/A;   VEIN SURGERY     2 stents in R) leg    Social History   Tobacco Use   Smoking status: Every Day    Packs/day: 1.50    Years: 50.00    Additional pack years: 0.00    Total pack years: 75.00    Types: Cigarettes   Smokeless tobacco: Never  Vaping Use   Vaping Use: Never used  Substance Use Topics   Alcohol use: Yes    Comment: once a month   Drug use: No     Medication list has been reviewed and updated.  Current Meds  Medication Sig   acetaminophen (TYLENOL) 325 MG tablet Take 325 mg by mouth daily as needed for moderate pain or headache.   albuterol (VENTOLIN HFA) 108 (90 Base) MCG/ACT inhaler Inhale into the lungs. Inhale 2 inhalations into the lungs 4 (four) times daily PRN   amLODipine (NORVASC) 5 MG tablet Take 5 mg by mouth daily.   azelastine (ASTELIN) 0.1 % nasal spray Place 2 sprays into both nostrils 2 (two) times daily. Use in each nostril as directed   cetirizine (ZYRTEC) 10 MG tablet Take 1 tablet (10 mg total) by mouth daily.   clopidogrel (PLAVIX) 75 MG tablet Take 1 tablet (75 mg total) by mouth daily.   fluticasone (FLONASE) 50 MCG/ACT nasal spray SPRAY 2 SPRAYS INTO EACH NOSTRIL EVERY DAY   ibuprofen (ADVIL,MOTRIN) 200 MG tablet Take 400 mg by mouth every 8 (eight) hours as needed (for pain).   levothyroxine (SYNTHROID) 112 MCG tablet Take 1 tablet (112 mcg total) by mouth daily.   losartan (COZAAR) 100 MG tablet Take 100 mg by mouth daily.   metFORMIN (GLUCOPHAGE) 500 MG tablet TAKE 1 TABLET BY MOUTH EVERY DAY WITH BREAKFAST   pantoprazole (PROTONIX) 40 MG tablet Take 1 tablet (40 mg total) by  mouth daily.   rosuvastatin (CRESTOR) 40 MG tablet Take 40 mg by mouth daily.   sertraline (ZOLOFT) 100 MG tablet Take 1 tablet (100 mg total) by mouth daily.       09/04/2022    8:34 AM 05/06/2022    8:07 AM 11/03/2021    8:08 AM 02/25/2021   11:06 AM  GAD 7 : Generalized Anxiety Score  Nervous, Anxious, on Edge 0 0 0 0  Control/stop worrying 0 0 0 0  Worry too much - different things 0 0 0 0  Trouble relaxing 0 0 0 0  Restless 0 0 0 0  Easily annoyed or irritable 0 0 3 0  Afraid - awful might happen 0 0 0 0  Total GAD 7 Score 0 0 3 0  Anxiety  Difficulty Not difficult at all Not difficult at all Not difficult at all        09/04/2022    8:34 AM 05/06/2022    8:07 AM 11/03/2021    8:07 AM  Depression screen PHQ 2/9  Decreased Interest 0 0 2  Down, Depressed, Hopeless 0 0 1  PHQ - 2 Score 0 0 3  Altered sleeping 0 0 1  Tired, decreased energy 0 0 2  Change in appetite 0 0 0  Feeling bad or failure about yourself  0 0 0  Trouble concentrating 0 0 0  Moving slowly or fidgety/restless 0 0 0  Suicidal thoughts 0 0 0  PHQ-9 Score 0 0 6  Difficult doing work/chores Not difficult at all Not difficult at all Not difficult at all    BP Readings from Last 3 Encounters:  09/04/22 134/84  07/21/22 126/79  05/06/22 120/76    Physical Exam Vitals and nursing note reviewed.  HENT:     Head: Normocephalic.     Right Ear: Tympanic membrane and external ear normal.     Left Ear: Tympanic membrane and external ear normal.     Nose: Nose normal.  Eyes:     General: No scleral icterus.       Right eye: No discharge.        Left eye: No discharge.     Conjunctiva/sclera: Conjunctivae normal.     Pupils: Pupils are equal, round, and reactive to light.  Neck:     Thyroid: No thyromegaly.     Vascular: No JVD.     Trachea: No tracheal deviation.  Cardiovascular:     Rate and Rhythm: Normal rate and regular rhythm.     Heart sounds: Normal heart sounds. No murmur heard.    No  friction rub. No gallop.  Pulmonary:     Effort: No respiratory distress.     Breath sounds: Normal breath sounds. No wheezing or rales.  Abdominal:     General: Bowel sounds are normal.     Palpations: Abdomen is soft. There is no mass.     Tenderness: There is no abdominal tenderness. There is no guarding or rebound.  Musculoskeletal:        General: No tenderness. Normal range of motion.     Cervical back: Normal range of motion and neck supple.  Lymphadenopathy:     Cervical: No cervical adenopathy.  Skin:    General: Skin is warm.     Findings: No rash.  Neurological:     Mental Status: He is alert.     Cranial Nerves: Cranial nerves 2-12 are intact.     Sensory: Sensation is intact.     Motor: Motor function is intact.     Deep Tendon Reflexes: Reflexes are normal and symmetric.     Reflex Scores:      Patellar reflexes are 2+ on the right side and 2+ on the left side.    Wt Readings from Last 3 Encounters:  09/04/22 158 lb (71.7 kg)  07/21/22 165 lb 3.2 oz (74.9 kg)  05/06/22 166 lb (75.3 kg)    BP 134/84 (BP Location: Right Arm, Cuff Size: Large)   Pulse (!) 120   Ht 5\' 8"  (1.727 m)   Wt 158 lb (71.7 kg)   SpO2 97%   BMI 24.02 kg/m   Assessment and Plan:  1. Type 2 diabetes mellitus without complication, without long-term current use of insulin (HCC) Chronic.  Controlled.  Stable.  Asymptomatic.  Tolerating metformin medication at current dosing.  Will check A1c for current control and pending results will likely continue metformin 500 mg once a day.  Will recheck in 4 months. - metFORMIN (GLUCOPHAGE) 500 MG tablet; TAKE 1 TABLET BY MOUTH EVERY DAY WITH BREAKFAST  Dispense: 90 tablet; Refill: 1 - HgB A1c    Elizabeth Sauer, MD

## 2022-09-04 NOTE — Progress Notes (Signed)
Subjective:   Joel Flores is a 72 y.o. male who presents for Medicare Annual/Subsequent preventive examination.  I connected with  Anthon Plotts Aramburo on 09/04/22 by an in person visit and verified that I am speaking with the correct person using two identifiers.  Patient Location: Other:  In Office  Provider Location: Office/Clinic  I discussed the limitations of evaluation and management by telemedicine. The patient expressed understanding and agreed to proceed.   Review of Systems    Defer to PCP  Cardiac Risk Factors include: advanced age (>75men, >13 women);diabetes mellitus;male gender     Objective:    There were no vitals filed for this visit. There is no height or weight on file to calculate BMI.     09/04/2022    8:47 AM 08/25/2021   10:32 AM 05/11/2021    8:17 AM 05/03/2021   10:58 AM 07/31/2020   11:41 AM 05/08/2020   10:41 AM 07/24/2019   11:43 AM  Advanced Directives  Does Patient Have a Medical Advance Directive? No No No No No No No  Would patient like information on creating a medical advance directive? No - Patient declined Yes (MAU/Ambulatory/Procedural Areas - Information given)   No - Patient declined No - Patient declined Yes (MAU/Ambulatory/Procedural Areas - Information given)    Current Medications (verified) Outpatient Encounter Medications as of 09/04/2022  Medication Sig   acetaminophen (TYLENOL) 325 MG tablet Take 325 mg by mouth daily as needed for moderate pain or headache.   albuterol (VENTOLIN HFA) 108 (90 Base) MCG/ACT inhaler Inhale into the lungs. Inhale 2 inhalations into the lungs 4 (four) times daily PRN   amLODipine (NORVASC) 5 MG tablet Take 5 mg by mouth daily.   azelastine (ASTELIN) 0.1 % nasal spray Place 2 sprays into both nostrils 2 (two) times daily. Use in each nostril as directed   cetirizine (ZYRTEC) 10 MG tablet Take 1 tablet (10 mg total) by mouth daily.   clopidogrel (PLAVIX) 75 MG tablet Take 1 tablet (75 mg total) by mouth daily.    fluticasone (FLONASE) 50 MCG/ACT nasal spray SPRAY 2 SPRAYS INTO EACH NOSTRIL EVERY DAY   ibuprofen (ADVIL,MOTRIN) 200 MG tablet Take 400 mg by mouth every 8 (eight) hours as needed (for pain).   levothyroxine (SYNTHROID) 112 MCG tablet Take 1 tablet (112 mcg total) by mouth daily.   losartan (COZAAR) 100 MG tablet Take 100 mg by mouth daily.   pantoprazole (PROTONIX) 40 MG tablet Take 1 tablet (40 mg total) by mouth daily.   rosuvastatin (CRESTOR) 40 MG tablet Take 40 mg by mouth daily.   sertraline (ZOLOFT) 100 MG tablet Take 1 tablet (100 mg total) by mouth daily.   [DISCONTINUED] metFORMIN (GLUCOPHAGE) 500 MG tablet TAKE 1 TABLET BY MOUTH EVERY DAY WITH BREAKFAST   No facility-administered encounter medications on file as of 09/04/2022.    Allergies (verified) Sulfa antibiotics   History: Past Medical History:  Diagnosis Date   Allergy 1990   Anxiety    PANIC ATTACKS   Arthritis    right shoulder   Atherosclerosis of aorta (HCC) 02/12/2015   Cataract    COPD (chronic obstructive pulmonary disease) (HCC)    Cough    Depression    Environmental and seasonal allergies    Essential hypertension 02/12/2015   Family history of adverse reaction to anesthesia    Father - 27 - MI, then stroke after Prostate surgery   GERD (gastroesophageal reflux disease)    History of  hiatal hernia    Hyperlipidemia    Hypertension    Hypothyroidism    Incisional hernia, without obstruction or gangrene    PVD (peripheral vascular disease) (HCC) 11/17/2016   Thyroid disease    Venous insufficiency    Vertigo    can happen daily   Wears hearing aid in both ears    has, does not wear   Past Surgical History:  Procedure Laterality Date   CATARACT EXTRACTION W/PHACO Right 03/16/2018   Procedure: CATARACT EXTRACTION PHACO AND INTRAOCULAR LENS PLACEMENT (IOC);  Surgeon: Elliot Cousin, MD;  Location: [facility];  Service: Ophthalmology;  Laterality: Right;  [place]  01:05 CDE 11.85 Fluid pack lot  # [id number]   CATARACT EXTRACTION W/PHACO Left 05/08/2020   Procedure: CATARACT EXTRACTION PHACO AND INTRAOCULAR LENS PLACEMENT (IOC) LEFT 7.32 00:54.7 13.4%;  Surgeon: Lockie Mola, MD;  Location: [facility];  Service: Ophthalmology;  Laterality: Left;   CHOLECYSTECTOMY     COLONOSCOPY  2015   Dr Servando Snare- cleared for 5 years    COLONOSCOPY WITH PROPOFOL N/A 01/17/2018   Procedure: COLONOSCOPY WITH PROPOFOL;  Surgeon: Midge Minium, MD;  Location: [facility];  Service: Endoscopy;  Laterality: N/A;  requests early   HERNIA REPAIR  July 2019   INSERTION OF MESH N/A 08/31/2017   Procedure: INSERTION OF MESH;  Surgeon: Ancil Linsey, MD;  Location: [facility];  Service: General;  Laterality: N/A;   POLYPECTOMY  01/17/2018   Procedure: POLYPECTOMY;  Surgeon: Midge Minium, MD;  Location: [facility];  Service: Endoscopy;;   UMBILICAL HERNIA REPAIR N/A 08/31/2017   Procedure: HERNIA REPAIR UMBILICAL ADULT;  Surgeon: Ancil Linsey, MD;  Location: [facility];  Service: General;  Laterality: N/A;   VEIN SURGERY     2 stents in R) leg   Family History  Problem Relation Age of Onset   Diabetes Mother    Cancer Father    Heart disease Father    Stroke Father    Social History   Socioeconomic History   Marital status: Married    Spouse name: Not on file   Number of children: 3   Years of education: Not on file   Highest education level: 12th grade  Occupational History   Occupation: retired  Tobacco Use   Smoking status: Every Day    Packs/day: 1.50    Years: 50.00    Additional pack years: 0.00    Total pack years: 75.00    Types: Cigarettes   Smokeless tobacco: Never  Vaping Use   Vaping Use: Never used  Substance and Sexual Activity   Alcohol use: Yes    Comment: once a month   Drug use: No   Sexual activity: Not Currently  Other Topics Concern   Not on file  Social History Narrative   Not on file   Social Determinants of Health    Financial Resource Strain: Low Risk  (09/04/2022)   Overall Financial Resource Strain (CARDIA)    Difficulty of Paying Living Expenses: Not hard at all  Food Insecurity: No Food Insecurity (09/04/2022)   Hunger Vital Sign    Worried About Running Out of Food in the Last Year: Never true    Ran Out of Food in the Last Year: Never true  Transportation Needs: No Transportation Needs (09/04/2022)   PRAPARE - Administrator, Civil Service (Medical): No    Lack of Transportation (Non-Medical): No  Physical Activity: Inactive (08/25/2021)   Exercise Vital  Sign    Days of Exercise per Week: 0 days    Minutes of Exercise per Session: 0 min  Stress: No Stress Concern Present (09/04/2022)   [employer] of Occupational Health - Occupational Stress Questionnaire    Feeling of Stress : Not at all  Social Connections: Moderately Isolated (08/25/2021)   Social Connection and Isolation Panel [NHANES]    Frequency of Communication with Friends and Family: More than three times a week    Frequency of Social Gatherings with Friends and Family: Once a week    Attends Religious Services: Never    Database administrator or Organizations: No    Attends Banker Meetings: Never    Marital Status: Married    Tobacco Counseling -Counseling not needed.   Clinical Intake:  Pre-visit preparation completed: Yes  Pain : No/denies pain     BMI - recorded: 24.02 Nutritional Status: BMI of 19-24  Normal Nutritional Risks: None Diabetes: Yes  How often do you need to have someone help you when you read instructions, pamphlets, or other written materials from your doctor or pharmacy?: 1 - Never  Diabetic? Yes.  Interpreter Needed?: No  Information entered by :: Margaretha Sheffield, CMA   Activities of Daily Living    09/04/2022    8:47 AM  In your present state of health, do you have any difficulty performing the following activities:  Hearing? 0  Vision? 0  Difficulty  concentrating or making decisions? 0  Walking or climbing stairs? 1  Dressing or bathing? 0  Doing errands, shopping? 0  Preparing Food and eating ? N  Using the Toilet? N  In the past six months, have you accidently leaked urine? N  Do you have problems with loss of bowel control? N  Managing your Medications? N  Managing your Finances? N  Housekeeping or managing your Housekeeping? N    Patient Care Team: Duanne Limerick, MD as PCP - General (Family Medicine)  Indicate any recent Medical Services you may have received from other than [facility] providers in the past year (date may be approximate).     Assessment:   This is a routine wellness examination for [employer].  Hearing/Vision screen - No concerns at this time.  Dietary issues and exercise activities discussed: Current Exercise Habits: The patient does not participate in regular exercise at present   Goals Addressed   None   Depression Screen    09/04/2022    8:34 AM 05/06/2022    8:07 AM 11/03/2021    8:07 AM 08/25/2021   10:31 AM 02/25/2021   11:05 AM 11/13/2020    8:41 AM 08/23/2020   10:23 AM  PHQ 2/9 Scores  PHQ - 2 Score 0 0 3 0 0 0 0  PHQ- 9 Score 0 0 6 4 0 0 0    Fall Risk    09/04/2022    8:34 AM 05/06/2022    8:06 AM 11/03/2021    8:07 AM 08/25/2021   10:33 AM 11/13/2020    8:41 AM  Fall Risk   Falls in the past year? 0 0 0 0 0  Number falls in past yr: 0 0 0 0 0  Injury with Fall? 0 0 0 0 0  Risk for fall due to : No Fall Risks No Fall Risks No Fall Risks No Fall Risks No Fall Risks  Follow up Falls evaluation completed Falls evaluation completed Falls evaluation completed Falls prevention discussed Falls evaluation completed  FALL RISK PREVENTION PERTAINING TO THE HOME:  Any stairs in or around the home? Yes  If so, are there any without handrails? No  Home free of loose throw rugs in walkways, pet beds, electrical cords, etc? Yes  Adequate lighting in your home to reduce risk of falls? Yes    ASSISTIVE DEVICES UTILIZED TO PREVENT FALLS:  Life alert? No  Use of a cane, walker or w/c? No   TIMED UP AND GO:  Was the test performed? Yes .  Gait steady and fast without use of assistive device     07/31/2020   11:44 AM 07/24/2019   11:45 AM 06/01/2018    9:08 AM  6CIT Screen  What Year? 0 points 0 points 0 points  What month? 0 points 0 points 0 points  What time? 0 points 0 points 0 points  Count back from 20 0 points 0 points 0 points  Months in reverse 4 points 2 points 2 points  Repeat phrase 0 points 0 points 4 points  Total Score 4 points 2 points 6 points    Immunizations Immunization History  Administered Date(s) Administered   Fluad Quad(high Dose 65+) 01/18/2019, 05/20/2020   Influenza, High Dose Seasonal PF 01/29/2017, 02/09/2018   PFIZER(Purple Top)SARS-COV-2 Vaccination 05/26/2019, 06/20/2019, 02/19/2020   Pneumococcal Conjugate-13 11/02/2016   Pneumococcal Polysaccharide-23 12/20/2017   Tdap 11/02/2016    TDAP status: Up to date  Flu Vaccine status: Declined, Education has been provided regarding the importance of this vaccine but patient still declined. Advised may receive this vaccine at local pharmacy or Health Dept. Aware to provide a copy of the vaccination record if obtained from local pharmacy or Health Dept. Verbalized acceptance and understanding.  Pneumococcal vaccine status: Up to date  Covid-19 vaccine status: Completed vaccines  Qualifies for Shingles Vaccine? Yes   Zostavax completed No   Shingrix Completed?: No.    Education has been provided regarding the importance of this vaccine. Patient has been advised to call insurance company to determine out of pocket expense if they have not yet received this vaccine. Advised may also receive vaccine at local pharmacy or Health Dept. Verbalized acceptance and understanding.  Screening Tests Health Maintenance  Topic Date Due   Lung Cancer Screening  09/24/2022   COVID-19 Vaccine (4 -  2023-24 season) 09/20/2022 (Originally 12/26/2021)   Zoster Vaccines- Shingrix (1 of 2) 12/05/2022 (Originally 05/01/2000)   INFLUENZA VACCINE  11/26/2022   COLONOSCOPY (Pts 45-3yrs Insurance coverage will need to be confirmed)  01/18/2023   Diabetic kidney evaluation - eGFR measurement  05/07/2023   Diabetic kidney evaluation - Urine ACR  05/07/2023   Medicare Annual Wellness (AWV)  09/04/2023   DTaP/Tdap/Td (2 - Td or Tdap) 11/03/2026   Pneumonia Vaccine 75+ Years old  Completed   Hepatitis C Screening  Completed   HPV VACCINES  Aged Out    Health Maintenance  Health Maintenance Due  Topic Date Due   Lung Cancer Screening  09/24/2022    Colorectal cancer screening: Type of screening: Colonoscopy. Completed 01/17/2018. Repeat every 5 years  Lung Cancer Screening: (Low Dose CT Chest recommended if Age 3-80 years, 30 pack-year currently smoking OR have quit w/in 15years.) does not qualify.   Additional Screening:  Hepatitis C Screening: does not qualify  Vision Screening: Recommended annual ophthalmology exams for early detection of glaucoma and other disorders of the eye. Is the patient up to date with their annual eye exam?  Yes  Who is the provider  or what is the name of the office in which the patient attends annual eye exams? [facility] [place]  Dental Screening: Recommended annual dental exams for proper oral hygiene  Community Resource Referral / Chronic Care Management: CRR required this visit?  No   CCM required this visit?  No      Plan:     I have personally reviewed and noted the following in the patient's chart:   Medical and social history Use of alcohol, tobacco or illicit drugs  Current medications and supplements including opioid prescriptions. Patient is not currently taking opioid prescriptions. Functional ability and status Nutritional status Physical activity Advanced directives List of other physicians Hospitalizations, surgeries, and  ER visits in previous 12 months Vitals Screenings to include cognitive, depression, and falls Referrals and appointments  In addition, I have reviewed and discussed with patient certain preventive protocols, quality metrics, and best practice recommendations. A written personalized care plan for preventive services as well as general preventive health recommendations were provided to patient.     Mariel Sleet, CMA   09/04/2022   Nurse Notes: None.

## 2022-09-05 LAB — HEMOGLOBIN A1C
Est. average glucose Bld gHb Est-mCnc: 146 mg/dL
Hgb A1c MFr Bld: 6.7 % — ABNORMAL HIGH (ref 4.8–5.6)

## 2022-09-25 ENCOUNTER — Ambulatory Visit
Admission: RE | Admit: 2022-09-25 | Discharge: 2022-09-25 | Disposition: A | Discharge status: Home / Self Care | Payer: Medicare PPO | Source: Ambulatory Visit | Attending: Acute Care | Admitting: Acute Care

## 2022-09-25 DIAGNOSIS — Z122 Encounter for screening for malignant neoplasm of respiratory organs: Secondary | ICD-10-CM | POA: Insufficient documentation

## 2022-09-25 DIAGNOSIS — F17210 Nicotine dependence, cigarettes, uncomplicated: Secondary | ICD-10-CM | POA: Diagnosis not present

## 2022-09-25 DIAGNOSIS — Z87891 Personal history of nicotine dependence: Secondary | ICD-10-CM | POA: Diagnosis not present

## 2022-09-30 ENCOUNTER — Other Ambulatory Visit: Payer: Self-pay | Admitting: Acute Care

## 2022-09-30 DIAGNOSIS — Z87891 Personal history of nicotine dependence: Secondary | ICD-10-CM

## 2022-09-30 DIAGNOSIS — Z122 Encounter for screening for malignant neoplasm of respiratory organs: Secondary | ICD-10-CM

## 2022-09-30 DIAGNOSIS — F17210 Nicotine dependence, cigarettes, uncomplicated: Secondary | ICD-10-CM

## 2022-10-27 ENCOUNTER — Ambulatory Visit
Admission: RE | Admit: 2022-10-27 | Discharge: 2022-10-27 | Disposition: A | Discharge status: Home / Self Care | Payer: Medicare PPO | Source: Ambulatory Visit | Attending: Family Medicine | Admitting: Family Medicine

## 2022-10-27 ENCOUNTER — Ambulatory Visit: Payer: Medicare PPO | Admitting: Family Medicine

## 2022-10-27 ENCOUNTER — Ambulatory Visit
Admission: RE | Admit: 2022-10-27 | Discharge: 2022-10-27 | Disposition: A | Discharge status: Home / Self Care | Payer: Medicare PPO | Attending: Family Medicine | Admitting: Family Medicine

## 2022-10-27 ENCOUNTER — Encounter: Payer: Self-pay | Admitting: Family Medicine

## 2022-10-27 VITALS — BP 128/78 | HR 100 | Ht 68.0 in | Wt 159.0 lb

## 2022-10-27 DIAGNOSIS — R0781 Pleurodynia: Secondary | ICD-10-CM | POA: Insufficient documentation

## 2022-10-27 NOTE — Progress Notes (Signed)
Date:  10/27/2022   Name:  Joel Flores   DOB:  [birth year]   MRN:  [id number]   Chief Complaint: Bloated (On R) side near liver/ under incision from gallbladder removal)  Chest Pain  This is a new (chest wall pain) problem. The current episode started 1 to 4 weeks ago (3 weeks ago). The onset quality is sudden. The problem occurs constantly. The problem has been unchanged. The pain is present in the lateral region. The pain is mild. The quality of the pain is described as dull. The pain does not radiate. Associated symptoms include shortness of breath. Pertinent negatives include no abdominal pain, cough, dizziness, exertional chest pressure, fever, headaches, hemoptysis, leg pain, malaise/fatigue, nausea, palpitations or syncope. He has tried NSAIDs (icey hot) for the symptoms.    Lab Results  Component Value Date   NA 138 05/06/2022   K 4.4 05/06/2022   CO2 22 05/06/2022   GLUCOSE 106 (H) 05/06/2022   BUN 23 05/06/2022   CREATININE 0.74 (L) 05/06/2022   CALCIUM 10.1 05/06/2022   EGFR 96 05/06/2022   GFRNONAA 92 05/20/2020   Lab Results  Component Value Date   CHOL 121 05/06/2022   HDL 42 05/06/2022   LDLCALC 50 05/06/2022   TRIG 176 (H) 05/06/2022   CHOLHDL 3.7 06/17/2018   Lab Results  Component Value Date   TSH 4.860 (H) 06/12/2022   Lab Results  Component Value Date   HGBA1C 6.7 (H) 09/04/2022   Lab Results  Component Value Date   WBC 8.5 11/11/2018   HGB 15.1 11/11/2018   HCT 44.1 11/11/2018   MCV 86 11/11/2018   PLT 322 11/11/2018   Lab Results  Component Value Date   ALT 29 05/06/2022   AST 39 05/06/2022   ALKPHOS 100 05/06/2022   BILITOT 0.4 05/06/2022   No results found for: "25OHVITD2", "25OHVITD3", "VD25OH"   Review of Systems  Constitutional:  Negative for fever and malaise/fatigue.  Respiratory:  Positive for shortness of breath and wheezing. Negative for cough, hemoptysis and chest tightness.   Cardiovascular:  Positive for chest pain.  Negative for palpitations, leg swelling and syncope.  Gastrointestinal:  Negative for abdominal pain and nausea.  Neurological:  Negative for dizziness and headaches.    Patient Active Problem List   Diagnosis Date Noted   Bilateral carotid artery stenosis 09/20/2019   Colon cancer screening    Polyp of sigmoid colon    SOBOE (shortness of breath on exertion) 07/30/2017   PVD (peripheral vascular disease) (HCC) 11/17/2016   Tobacco use disorder 11/17/2016   Reactive airway disease, mild intermittent, uncomplicated 08/12/2016   Allergic rhinitis due to pollen 08/13/2015   Nocturia 08/13/2015   Atherosclerosis of aorta (HCC) 02/12/2015   Essential hypertension 02/12/2015   Esophageal reflux 02/12/2015   Depression 02/12/2015   Adult hypothyroidism 02/12/2015   Chronic anxiety 02/12/2015   Hyperlipemia 02/12/2015    Allergies  Allergen Reactions   Sulfa Antibiotics Rash    Past Surgical History:  Procedure Laterality Date   CATARACT EXTRACTION W/PHACO Right 03/16/2018   Procedure: CATARACT EXTRACTION PHACO AND INTRAOCULAR LENS PLACEMENT (IOC);  Surgeon: Elliot Cousin, MD;  Location: [facility];  Service: Ophthalmology;  Laterality: Right;  [place]  01:05 CDE 11.85 Fluid pack lot # [id number]   CATARACT EXTRACTION W/PHACO Left 05/08/2020   Procedure: CATARACT EXTRACTION PHACO AND INTRAOCULAR LENS PLACEMENT (IOC) LEFT 7.32 00:54.7 13.4%;  Surgeon: Lockie Mola, MD;  Location: [facility];  Service: Ophthalmology;  Laterality: Left;   CHOLECYSTECTOMY     COLONOSCOPY  2015   Dr Servando Snare- cleared for 5 years    COLONOSCOPY WITH PROPOFOL N/A 01/17/2018   Procedure: COLONOSCOPY WITH PROPOFOL;  Surgeon: Midge Minium, MD;  Location: [facility];  Service: Endoscopy;  Laterality: N/A;  requests early   HERNIA REPAIR  July 2019   INSERTION OF MESH N/A 08/31/2017   Procedure: INSERTION OF MESH;  Surgeon: Ancil Linsey, MD;  Location: [facility];  Service: General;   Laterality: N/A;   POLYPECTOMY  01/17/2018   Procedure: POLYPECTOMY;  Surgeon: Midge Minium, MD;  Location: [facility];  Service: Endoscopy;;   UMBILICAL HERNIA REPAIR N/A 08/31/2017   Procedure: HERNIA REPAIR UMBILICAL ADULT;  Surgeon: Ancil Linsey, MD;  Location: [facility];  Service: General;  Laterality: N/A;   VEIN SURGERY     2 stents in R) leg    Social History   Tobacco Use   Smoking status: Every Day    Packs/day: 1.50    Years: 50.00    Additional pack years: 0.00    Total pack years: 75.00    Types: Cigarettes   Smokeless tobacco: Never  Vaping Use   Vaping Use: Never used  Substance Use Topics   Alcohol use: Yes    Comment: once a month   Drug use: No     Medication list has been reviewed and updated.  Current Meds  Medication Sig   acetaminophen (TYLENOL) 325 MG tablet Take 325 mg by mouth daily as needed for moderate pain or headache.   albuterol (VENTOLIN HFA) 108 (90 Base) MCG/ACT inhaler Inhale into the lungs. Inhale 2 inhalations into the lungs 4 (four) times daily PRN   amLODipine (NORVASC) 5 MG tablet Take 5 mg by mouth daily.   azelastine (ASTELIN) 0.1 % nasal spray Place 2 sprays into both nostrils 2 (two) times daily. Use in each nostril as directed   cetirizine (ZYRTEC) 10 MG tablet Take 1 tablet (10 mg total) by mouth daily.   clopidogrel (PLAVIX) 75 MG tablet Take 1 tablet (75 mg total) by mouth daily.   fluticasone (FLONASE) 50 MCG/ACT nasal spray SPRAY 2 SPRAYS INTO EACH NOSTRIL EVERY DAY   ibuprofen (ADVIL,MOTRIN) 200 MG tablet Take 400 mg by mouth every 8 (eight) hours as needed (for pain).   levothyroxine (SYNTHROID) 112 MCG tablet Take 1 tablet (112 mcg total) by mouth daily.   losartan (COZAAR) 100 MG tablet Take 100 mg by mouth daily.   metFORMIN (GLUCOPHAGE) 500 MG tablet TAKE 1 TABLET BY MOUTH EVERY DAY WITH BREAKFAST   pantoprazole (PROTONIX) 40 MG tablet Take 1 tablet (40 mg total) by mouth daily.   rosuvastatin (CRESTOR)  40 MG tablet Take 40 mg by mouth daily.   sertraline (ZOLOFT) 100 MG tablet Take 1 tablet (100 mg total) by mouth daily.       10/27/2022    3:10 PM 09/04/2022    8:34 AM 05/06/2022    8:07 AM 11/03/2021    8:08 AM  GAD 7 : Generalized Anxiety Score  Nervous, Anxious, on Edge 0 0 0 0  Control/stop worrying 0 0 0 0  Worry too much - different things 0 0 0 0  Trouble relaxing 0 0 0 0  Restless 0 0 0 0  Easily annoyed or irritable 0 0 0 3  Afraid - awful might happen 0 0 0 0  Total GAD 7 Score 0 0 0 3  Anxiety Difficulty  Not difficult at all Not difficult at all Not difficult at all Not difficult at all       10/27/2022    3:10 PM 09/04/2022    8:34 AM 05/06/2022    8:07 AM  Depression screen PHQ 2/9  Decreased Interest 0 0 0  Down, Depressed, Hopeless 0 0 0  PHQ - 2 Score 0 0 0  Altered sleeping 0 0 0  Tired, decreased energy 0 0 0  Change in appetite 0 0 0  Feeling bad or failure about yourself  0 0 0  Trouble concentrating 0 0 0  Moving slowly or fidgety/restless 0 0 0  Suicidal thoughts 0 0 0  PHQ-9 Score 0 0 0  Difficult doing work/chores Not difficult at all Not difficult at all Not difficult at all    BP Readings from Last 3 Encounters:  10/27/22 128/78  09/04/22 134/84  07/21/22 126/79    Physical Exam Vitals and nursing note reviewed.  HENT:     Head: Normocephalic.     Right Ear: Tympanic membrane, ear canal and external ear normal. There is no impacted cerumen.     Left Ear: Tympanic membrane, ear canal and external ear normal. There is no impacted cerumen.     Nose: No congestion or rhinorrhea.     Mouth/Throat:     Mouth: Mucous membranes are moist.     Pharynx: No oropharyngeal exudate or posterior oropharyngeal erythema.  Eyes:     Extraocular Movements: Extraocular movements intact.     Conjunctiva/sclera: Conjunctivae normal.     Pupils: Pupils are equal, round, and reactive to light.  Pulmonary:     Breath sounds: No wheezing, rhonchi or rales.   Abdominal:     General: There is no distension.     Palpations: There is no hepatomegaly, splenomegaly or mass.     Tenderness: There is no abdominal tenderness. There is no guarding.  Neurological:     Mental Status: He is alert.     Wt Readings from Last 3 Encounters:  10/27/22 159 lb (72.1 kg)  09/04/22 158 lb (71.7 kg)  07/21/22 165 lb 3.2 oz (74.9 kg)    BP 128/78   Pulse 100   Ht 5\' 8"  (1.727 m)   Wt 159 lb (72.1 kg)   SpO2 96%   BMI 24.18 kg/m   Assessment and Plan:  1. Rib pain on right side New onset.  3 days after pressure washing and driveway patient noted a knot and discomfort of the right area which appears to be vicinity of the 11th and 12th distal ribs.  There is some mild tenderness but breath sounds are normal.  We will obtain an x-ray to make sure there is no issues or fractures and treat with over-the-counter Advil. - DG Ribs Unilateral Right    Elizabeth Sauer, MD

## 2022-11-17 ENCOUNTER — Other Ambulatory Visit: Payer: Self-pay | Admitting: Family Medicine

## 2022-11-17 DIAGNOSIS — I700 Atherosclerosis of aorta: Secondary | ICD-10-CM

## 2022-11-25 DIAGNOSIS — Z872 Personal history of diseases of the skin and subcutaneous tissue: Secondary | ICD-10-CM | POA: Diagnosis not present

## 2022-11-25 DIAGNOSIS — C4359 Malignant melanoma of other part of trunk: Secondary | ICD-10-CM | POA: Diagnosis not present

## 2022-11-25 DIAGNOSIS — L570 Actinic keratosis: Secondary | ICD-10-CM | POA: Diagnosis not present

## 2022-11-25 DIAGNOSIS — L578 Other skin changes due to chronic exposure to nonionizing radiation: Secondary | ICD-10-CM | POA: Diagnosis not present

## 2022-11-25 DIAGNOSIS — D485 Neoplasm of uncertain behavior of skin: Secondary | ICD-10-CM | POA: Diagnosis not present

## 2022-11-27 ENCOUNTER — Other Ambulatory Visit
Admission: RE | Admit: 2022-11-27 | Discharge: 2022-11-27 | Disposition: A | Discharge status: Home / Self Care | Payer: Medicare PPO | Attending: Family Medicine | Admitting: Family Medicine

## 2022-11-27 ENCOUNTER — Encounter: Payer: Self-pay | Admitting: Family Medicine

## 2022-11-27 ENCOUNTER — Ambulatory Visit: Payer: Medicare PPO | Admitting: Family Medicine

## 2022-11-27 VITALS — BP 122/80 | HR 100 | Ht 68.0 in | Wt 160.0 lb

## 2022-11-27 DIAGNOSIS — E039 Hypothyroidism, unspecified: Secondary | ICD-10-CM

## 2022-11-27 DIAGNOSIS — E119 Type 2 diabetes mellitus without complications: Secondary | ICD-10-CM

## 2022-11-27 DIAGNOSIS — I10 Essential (primary) hypertension: Secondary | ICD-10-CM | POA: Insufficient documentation

## 2022-11-27 DIAGNOSIS — K219 Gastro-esophageal reflux disease without esophagitis: Secondary | ICD-10-CM

## 2022-11-27 DIAGNOSIS — F419 Anxiety disorder, unspecified: Secondary | ICD-10-CM | POA: Diagnosis not present

## 2022-11-27 DIAGNOSIS — Z7984 Long term (current) use of oral hypoglycemic drugs: Secondary | ICD-10-CM | POA: Diagnosis not present

## 2022-11-27 DIAGNOSIS — F3342 Major depressive disorder, recurrent, in full remission: Secondary | ICD-10-CM | POA: Diagnosis not present

## 2022-11-27 DIAGNOSIS — J301 Allergic rhinitis due to pollen: Secondary | ICD-10-CM

## 2022-11-27 DIAGNOSIS — I700 Atherosclerosis of aorta: Secondary | ICD-10-CM

## 2022-11-27 LAB — RENAL FUNCTION PANEL
Albumin: 3.8 g/dL (ref 3.5–5.0)
Anion gap: 9 (ref 5–15)
BUN: 14 mg/dL (ref 8–23)
CO2: 22 mmol/L (ref 22–32)
Calcium: 9 mg/dL (ref 8.9–10.3)
Chloride: 105 mmol/L (ref 98–111)
Creatinine, Ser: 0.64 mg/dL (ref 0.61–1.24)
GFR, Estimated: >60 mL/min (ref >60)
Glucose, Bld: 96 mg/dL (ref 70–99)
Phosphorus: 3.3 mg/dL (ref 2.5–4.6)
Potassium: 4.1 mmol/L (ref 3.5–5.1)
Sodium: 136 mmol/L (ref 135–145)

## 2022-11-27 LAB — TSH: TSH: 1.612 u[IU]/mL (ref 0.350–4.500)

## 2022-11-27 LAB — HEMOGLOBIN A1C
Hgb A1c MFr Bld: 6.8 % — ABNORMAL HIGH (ref 4.8–5.6)
Mean Plasma Glucose: 148.46 mg/dL

## 2022-11-27 MED ORDER — CETIRIZINE HCL 10 MG PO TABS: 10.0000 mg | ORAL_TABLET | Freq: Every day | ORAL | 1 refills | Status: DC

## 2022-11-27 MED ORDER — SERTRALINE HCL 100 MG PO TABS: 100.0000 mg | ORAL_TABLET | Freq: Every day | ORAL | 1 refills | Status: DC

## 2022-11-27 MED ORDER — LEVOTHYROXINE SODIUM 112 MCG PO TABS: 112.0000 ug | ORAL_TABLET | Freq: Every day | ORAL | 1 refills | Status: DC

## 2022-11-27 MED ORDER — PANTOPRAZOLE SODIUM 40 MG PO TBEC: 40.0000 mg | DELAYED_RELEASE_TABLET | Freq: Every day | ORAL | 1 refills | Status: DC

## 2022-11-27 MED ORDER — FLUTICASONE PROPIONATE 50 MCG/ACT NA SUSP: NASAL | 11 refills | Status: DC

## 2022-11-27 MED ORDER — METFORMIN HCL 500 MG PO TABS: ORAL_TABLET | ORAL | 1 refills | Status: DC

## 2022-11-27 MED ORDER — CLOPIDOGREL BISULFATE 75 MG PO TABS: 75.0000 mg | ORAL_TABLET | Freq: Every day | ORAL | 1 refills | Status: DC

## 2022-11-27 NOTE — Progress Notes (Signed)
Date:  11/27/2022   Name:  Joel Flores   DOB:  03/02/1951   MRN:  [id number]   Chief Complaint: Medication Refill  Thyroid Problem Presents for follow-up visit. Symptoms include diaphoresis and weight loss. Patient reports no anxiety, cold intolerance, constipation, depressed mood, diarrhea, dry skin, fatigue, hair loss, heat intolerance, leg swelling, nail problem, palpitations, visual change or weight gain.  Diabetes He presents for his follow-up diabetic visit. He has type 2 diabetes mellitus. Pertinent negatives for hypoglycemia include no confusion, headaches or nervousness/anxiousness. Associated symptoms include weight loss. Pertinent negatives for diabetes include no chest pain, no fatigue, no polydipsia, no polyuria and no visual change. There are no hypoglycemic complications. Symptoms are stable. There are no diabetic complications. Risk factors for coronary artery disease include dyslipidemia and hypertension. Current diabetic treatment includes oral agent (monotherapy). He is following a generally healthy diet. Meal planning includes avoidance of concentrated sweets and carbohydrate counting (eats once a day).  Depression        Associated symptoms include no decreased concentration, no fatigue, no helplessness, no hopelessness, does not have insomnia, not irritable, no restlessness, no decreased interest, no appetite change, no body aches, no myalgias, no headaches, no indigestion, not sad and no suicidal ideas.  Past medical history includes thyroid problem and anxiety.   Anxiety Presents for follow-up visit. Patient reports no chest pain, confusion, decreased concentration, depressed mood, excessive worry, insomnia, irritability, nervous/anxious behavior, palpitations, panic, restlessness or suicidal ideas.    Gastroesophageal Reflux He reports no abdominal pain, no belching, no chest pain, no dysphagia or no heartburn. This is a chronic problem. The current episode started more  than 1 year ago. The problem has been gradually improving. The symptoms are aggravated by certain foods. Associated symptoms include weight loss. Pertinent negatives include no fatigue.    Lab Results  Component Value Date   NA 138 05/06/2022   K 4.4 05/06/2022   CO2 22 05/06/2022   GLUCOSE 106 (H) 05/06/2022   BUN 23 05/06/2022   CREATININE 0.74 (L) 05/06/2022   CALCIUM 10.1 05/06/2022   EGFR 96 05/06/2022   GFRNONAA 92 05/20/2020   Lab Results  Component Value Date   CHOL 121 05/06/2022   HDL 42 05/06/2022   LDLCALC 50 05/06/2022   TRIG 176 (H) 05/06/2022   CHOLHDL 3.7 06/17/2018   Lab Results  Component Value Date   TSH 4.860 (H) 06/12/2022   Lab Results  Component Value Date   HGBA1C 6.7 (H) 09/04/2022   Lab Results  Component Value Date   WBC 8.5 11/11/2018   HGB 15.1 11/11/2018   HCT 44.1 11/11/2018   MCV 86 11/11/2018   PLT 322 11/11/2018   Lab Results  Component Value Date   ALT 29 05/06/2022   AST 39 05/06/2022   ALKPHOS 100 05/06/2022   BILITOT 0.4 05/06/2022   No results found for: "25OHVITD2", "25OHVITD3", "VD25OH"   Review of Systems  Constitutional:  Positive for diaphoresis and weight loss. Negative for appetite change, fatigue, irritability and weight gain.  Cardiovascular:  Negative for chest pain and palpitations.  Gastrointestinal:  Negative for abdominal pain, constipation, diarrhea, dysphagia and heartburn.  Endocrine: Negative for cold intolerance, heat intolerance, polydipsia and polyuria.  Musculoskeletal:  Negative for myalgias.  Neurological:  Negative for headaches.  Psychiatric/Behavioral:  Positive for depression. Negative for confusion, decreased concentration and suicidal ideas. The patient is not nervous/anxious and does not have insomnia.     Patient Active Problem List  Diagnosis Date Noted   Bilateral carotid artery stenosis 09/20/2019   Colon cancer screening    Polyp of sigmoid colon    SOBOE (shortness of breath on  exertion) 07/30/2017   PVD (peripheral vascular disease) (HCC) 11/17/2016   Tobacco use disorder 11/17/2016   Reactive airway disease, mild intermittent, uncomplicated 08/12/2016   Allergic rhinitis due to pollen 08/13/2015   Nocturia 08/13/2015   Atherosclerosis of aorta (HCC) 02/12/2015   Essential hypertension 02/12/2015   Esophageal reflux 02/12/2015   Depression 02/12/2015   Adult hypothyroidism 02/12/2015   Chronic anxiety 02/12/2015   Hyperlipemia 02/12/2015    Allergies  Allergen Reactions   Sulfa Antibiotics Rash    Past Surgical History:  Procedure Laterality Date   CATARACT EXTRACTION W/PHACO Right 03/16/2018   Procedure: CATARACT EXTRACTION PHACO AND INTRAOCULAR LENS PLACEMENT (IOC);  Surgeon: Elliot Cousin, MD;  Location: [facility];  Service: Ophthalmology;  Laterality: Right;  [place]  01:05 CDE 11.85 Fluid pack lot # [id number]   CATARACT EXTRACTION W/PHACO Left 05/08/2020   Procedure: CATARACT EXTRACTION PHACO AND INTRAOCULAR LENS PLACEMENT (IOC) LEFT 7.32 00:54.7 13.4%;  Surgeon: Lockie Mola, MD;  Location: [facility];  Service: Ophthalmology;  Laterality: Left;   CHOLECYSTECTOMY     COLONOSCOPY  2015   Dr Servando Snare- cleared for 5 years    COLONOSCOPY WITH PROPOFOL N/A 01/17/2018   Procedure: COLONOSCOPY WITH PROPOFOL;  Surgeon: Midge Minium, MD;  Location: [facility];  Service: Endoscopy;  Laterality: N/A;  requests early   HERNIA REPAIR  July 2019   INSERTION OF MESH N/A 08/31/2017   Procedure: INSERTION OF MESH;  Surgeon: Ancil Linsey, MD;  Location: [facility];  Service: General;  Laterality: N/A;   POLYPECTOMY  01/17/2018   Procedure: POLYPECTOMY;  Surgeon: Midge Minium, MD;  Location: [facility];  Service: Endoscopy;;   UMBILICAL HERNIA REPAIR N/A 08/31/2017   Procedure: HERNIA REPAIR UMBILICAL ADULT;  Surgeon: Ancil Linsey, MD;  Location: [facility];  Service: General;  Laterality: N/A;   VEIN SURGERY     2 stents in R) leg     Social History   Tobacco Use   Smoking status: Every Day    Current packs/day: 1.50    Average packs/day: 1.5 packs/day for 50.0 years (75.0 ttl pk-yrs)    Types: Cigarettes   Smokeless tobacco: Never  Vaping Use   Vaping status: Never Used  Substance Use Topics   Alcohol use: Yes    Comment: once a month   Drug use: No     Medication list has been reviewed and updated.  Current Meds  Medication Sig   acetaminophen (TYLENOL) 325 MG tablet Take 325 mg by mouth daily as needed for moderate pain or headache.   albuterol (VENTOLIN HFA) 108 (90 Base) MCG/ACT inhaler Inhale into the lungs. Inhale 2 inhalations into the lungs 4 (four) times daily PRN   amLODipine (NORVASC) 5 MG tablet Take 5 mg by mouth daily.   azelastine (ASTELIN) 0.1 % nasal spray Place 2 sprays into both nostrils 2 (two) times daily. Use in each nostril as directed   cetirizine (ZYRTEC) 10 MG tablet Take 1 tablet (10 mg total) by mouth daily.   clopidogrel (PLAVIX) 75 MG tablet TAKE 1 TABLET BY MOUTH EVERY DAY   fluticasone (FLONASE) 50 MCG/ACT nasal spray SPRAY 2 SPRAYS INTO EACH NOSTRIL EVERY DAY   ibuprofen (ADVIL,MOTRIN) 200 MG tablet Take 400 mg by mouth every 8 (eight) hours as needed (for pain).  levothyroxine (SYNTHROID) 112 MCG tablet Take 1 tablet (112 mcg total) by mouth daily.   losartan (COZAAR) 100 MG tablet Take 100 mg by mouth daily.   metFORMIN (GLUCOPHAGE) 500 MG tablet TAKE 1 TABLET BY MOUTH EVERY DAY WITH BREAKFAST   pantoprazole (PROTONIX) 40 MG tablet Take 1 tablet (40 mg total) by mouth daily.   rosuvastatin (CRESTOR) 40 MG tablet Take 40 mg by mouth daily.   sertraline (ZOLOFT) 100 MG tablet Take 1 tablet (100 mg total) by mouth daily.       11/27/2022    9:46 AM 10/27/2022    3:10 PM 09/04/2022    8:34 AM 05/06/2022    8:07 AM  GAD 7 : Generalized Anxiety Score  Nervous, Anxious, on Edge 0 0 0 0  Control/stop worrying 0 0 0 0  Worry too much - different things 0 0 0 0  Trouble  relaxing 0 0 0 0  Restless 0 0 0 0  Easily annoyed or irritable 0 0 0 0  Afraid - awful might happen 0 0 0 0  Total GAD 7 Score 0 0 0 0  Anxiety Difficulty Not difficult at all Not difficult at all Not difficult at all Not difficult at all       11/27/2022    9:46 AM 10/27/2022    3:10 PM 09/04/2022    8:34 AM  Depression screen PHQ 2/9  Decreased Interest 0 0 0  Down, Depressed, Hopeless 0 0 0  PHQ - 2 Score 0 0 0  Altered sleeping 0 0 0  Tired, decreased energy 0 0 0  Change in appetite 0 0 0  Feeling bad or failure about yourself  0 0 0  Trouble concentrating 0 0 0  Moving slowly or fidgety/restless 0 0 0  Suicidal thoughts 0 0 0  PHQ-9 Score 0 0 0  Difficult doing work/chores Not difficult at all Not difficult at all Not difficult at all    BP Readings from Last 3 Encounters:  10/27/22 128/78  09/04/22 134/84  07/21/22 126/79    Physical Exam Vitals and nursing note reviewed.  Constitutional:      General: He is not irritable. HENT:     Head: Normocephalic.     Right Ear: Tympanic membrane, ear canal and external ear normal.     Left Ear: Tympanic membrane, ear canal and external ear normal.     Nose: Nose normal.     Mouth/Throat:     Mouth: Mucous membranes are moist.  Eyes:     General: No scleral icterus.       Right eye: No discharge.        Left eye: No discharge.     Conjunctiva/sclera: Conjunctivae normal.     Pupils: Pupils are equal, round, and reactive to light.  Neck:     Thyroid: No thyromegaly.     Vascular: No JVD.     Trachea: No tracheal deviation.  Cardiovascular:     Rate and Rhythm: Normal rate and regular rhythm.     Heart sounds: Normal heart sounds. No murmur heard.    No friction rub. No gallop.  Pulmonary:     Effort: No respiratory distress.     Breath sounds: Normal breath sounds. No wheezing, rhonchi or rales.  Abdominal:     General: Bowel sounds are normal.     Palpations: Abdomen is soft. There is no mass.     Tenderness:  There is no abdominal tenderness. There  is no guarding or rebound.       Comments: Incisional hernia right  Musculoskeletal:        General: No tenderness. Normal range of motion.     Cervical back: Normal range of motion and neck supple.  Lymphadenopathy:     Cervical: No cervical adenopathy.  Skin:    General: Skin is warm.     Findings: No rash.  Neurological:     Mental Status: He is alert and oriented to person, place, and time.     Cranial Nerves: No cranial nerve deficit.     Deep Tendon Reflexes: Reflexes are normal and symmetric.     Wt Readings from Last 3 Encounters:  11/27/22 160 lb (72.6 kg)  10/27/22 159 lb (72.1 kg)  09/04/22 158 lb (71.7 kg)    Pulse 100   Ht 5\' 8"  (1.727 m)   Wt 160 lb (72.6 kg)   SpO2 98%   BMI 24.33 kg/m   Assessment and Plan: 1. Non-seasonal allergic rhinitis due to pollen Chronic.  Controlled.  Stable.  Continue Zyrtec 10 mg once a day. - cetirizine (ZYRTEC) 10 MG tablet; Take 1 tablet (10 mg total) by mouth daily.  Dispense: 90 tablet; Refill: 1  2. Atherosclerosis of aorta (HCC) Chronic.  Controlled.  Stable.  History of atherosclerosis that is noted and we will treat with Plavix 75 mg daily. - clopidogrel (PLAVIX) 75 MG tablet; Take 1 tablet (75 mg total) by mouth daily.  Dispense: 90 tablet; Refill: 1  3. Adult hypothyroidism Chronic.  Controlled.  Stable.  Asymptomatic.  Tolerating current dosing of levothyroxine 112 mcg daily.  Will check TSH to see if this dosing is sufficient. - levothyroxine (SYNTHROID) 112 MCG tablet; Take 1 tablet (112 mcg total) by mouth daily.  Dispense: 90 tablet; Refill: 1 - TSH  4. Type 2 diabetes mellitus without complication, without long-term current use of insulin (HCC) Chronic.  Controlled.  Stable.  Continue metformin 500 mg once a day.  Will check A1c for current level of control and renal function panel for electrolytes and GFR. - metFORMIN (GLUCOPHAGE) 500 MG tablet; TAKE 1 TABLET BY MOUTH  EVERY DAY WITH BREAKFAST  Dispense: 90 tablet; Refill: 1 - HgB A1c - Renal Function Panel  5. Gastroesophageal reflux disease, unspecified whether esophagitis present Chronic.  Controlled.  Stable.  Continue pantoprazole 40 mg once a day. - pantoprazole (PROTONIX) 40 MG tablet; Take 1 tablet (40 mg total) by mouth daily.  Dispense: 90 tablet; Refill: 1  6. Chronic anxiety Chronic.  Controlled.  Stable.  GAD score 0.  Continue sertraline 100 mg daily. - sertraline (ZOLOFT) 100 MG tablet; Take 1 tablet (100 mg total) by mouth daily.  Dispense: 90 tablet; Refill: 1  7. Recurrent major depressive disorder, in full remission (HCC) Chronic.  Controlled.  Stable.  PHQ is 0.  Continue sertraline. - sertraline (ZOLOFT) 100 MG tablet; Take 1 tablet (100 mg total) by mouth daily.  Dispense: 90 tablet; Refill: 1     Elizabeth Sauer, MD

## 2022-12-30 DIAGNOSIS — C439 Malignant melanoma of skin, unspecified: Secondary | ICD-10-CM | POA: Diagnosis not present

## 2022-12-30 DIAGNOSIS — I10 Essential (primary) hypertension: Secondary | ICD-10-CM | POA: Diagnosis not present

## 2022-12-30 DIAGNOSIS — Z01818 Encounter for other preprocedural examination: Secondary | ICD-10-CM | POA: Diagnosis not present

## 2022-12-30 DIAGNOSIS — C4359 Malignant melanoma of other part of trunk: Secondary | ICD-10-CM | POA: Diagnosis not present

## 2022-12-30 DIAGNOSIS — E039 Hypothyroidism, unspecified: Secondary | ICD-10-CM | POA: Diagnosis not present

## 2023-01-06 ENCOUNTER — Other Ambulatory Visit: Payer: Self-pay | Admitting: Family Medicine

## 2023-01-06 DIAGNOSIS — E039 Hypothyroidism, unspecified: Secondary | ICD-10-CM

## 2023-01-07 NOTE — Telephone Encounter (Signed)
Requested Prescriptions  Pending Prescriptions Disp Refills   levothyroxine (SYNTHROID) 112 MCG tablet [Pharmacy Med Name: LEVOTHYROXINE 112 MCG TABLET] 90 tablet 1    Sig: TAKE 1 TABLET BY MOUTH EVERY DAY     Endocrinology:  Hypothyroid Agents Passed - 01/06/2023  1:34 AM      Passed - TSH in normal range and within 360 days    TSH  Date Value Ref Range Status  11/27/2022 1.612 0.350 - 4.500 uIU/mL Final    Comment:    Performed by a 3rd Generation assay with a functional sensitivity of <=0.01 uIU/mL. Performed at [facility], [address] [place], [place] [address]   06/12/2022 4.860 (H) 0.450 - 4.500 uIU/mL Final         Passed - Valid encounter within last 12 months    Recent Outpatient Visits           1 month ago Type 2 diabetes mellitus without complication, without long-term current use of insulin (HCC)   [place] [facility], MD   2 months ago Rib pain on right side   [facility] at [facility], MD   4 months ago Type 2 diabetes mellitus without complication, without long-term current use of insulin (HCC)   [place] [facility], MD   8 months ago Type 2 diabetes mellitus without complication, without long-term current use of insulin (HCC)   [place] [facility], MD   1 year ago Adult hypothyroidism   [place] [facility], MD       Future Appointments             In 2 months Duanne Limerick, MD [facility] at [facility], [facility]

## 2023-01-08 DIAGNOSIS — F17200 Nicotine dependence, unspecified, uncomplicated: Secondary | ICD-10-CM | POA: Diagnosis not present

## 2023-01-08 DIAGNOSIS — I2510 Atherosclerotic heart disease of native coronary artery without angina pectoris: Secondary | ICD-10-CM | POA: Diagnosis not present

## 2023-01-08 DIAGNOSIS — R0602 Shortness of breath: Secondary | ICD-10-CM | POA: Diagnosis not present

## 2023-01-08 DIAGNOSIS — I10 Essential (primary) hypertension: Secondary | ICD-10-CM | POA: Diagnosis not present

## 2023-01-08 DIAGNOSIS — E782 Mixed hyperlipidemia: Secondary | ICD-10-CM | POA: Diagnosis not present

## 2023-01-08 DIAGNOSIS — I6523 Occlusion and stenosis of bilateral carotid arteries: Secondary | ICD-10-CM | POA: Diagnosis not present

## 2023-01-13 DIAGNOSIS — C4359 Malignant melanoma of other part of trunk: Secondary | ICD-10-CM | POA: Diagnosis not present

## 2023-01-14 DIAGNOSIS — I2510 Atherosclerotic heart disease of native coronary artery without angina pectoris: Secondary | ICD-10-CM | POA: Diagnosis not present

## 2023-01-14 DIAGNOSIS — I6523 Occlusion and stenosis of bilateral carotid arteries: Secondary | ICD-10-CM | POA: Diagnosis not present

## 2023-01-14 DIAGNOSIS — C4359 Malignant melanoma of other part of trunk: Secondary | ICD-10-CM | POA: Diagnosis not present

## 2023-01-14 DIAGNOSIS — I70219 Atherosclerosis of native arteries of extremities with intermittent claudication, unspecified extremity: Secondary | ICD-10-CM | POA: Diagnosis not present

## 2023-01-14 DIAGNOSIS — Z7902 Long term (current) use of antithrombotics/antiplatelets: Secondary | ICD-10-CM | POA: Diagnosis not present

## 2023-01-14 DIAGNOSIS — Z79899 Other long term (current) drug therapy: Secondary | ICD-10-CM | POA: Diagnosis not present

## 2023-01-14 DIAGNOSIS — F17210 Nicotine dependence, cigarettes, uncomplicated: Secondary | ICD-10-CM | POA: Diagnosis not present

## 2023-01-14 DIAGNOSIS — I10 Essential (primary) hypertension: Secondary | ICD-10-CM | POA: Diagnosis not present

## 2023-01-14 DIAGNOSIS — J4520 Mild intermittent asthma, uncomplicated: Secondary | ICD-10-CM | POA: Diagnosis not present

## 2023-01-28 ENCOUNTER — Ambulatory Visit: Payer: Medicare PPO | Admitting: Family Medicine

## 2023-02-02 DIAGNOSIS — C4359 Malignant melanoma of other part of trunk: Secondary | ICD-10-CM | POA: Diagnosis not present

## 2023-02-26 DIAGNOSIS — M7981 Nontraumatic hematoma of soft tissue: Secondary | ICD-10-CM | POA: Diagnosis not present

## 2023-02-26 DIAGNOSIS — D0359 Melanoma in situ of other part of trunk: Secondary | ICD-10-CM | POA: Diagnosis not present

## 2023-03-09 ENCOUNTER — Emergency Department: Payer: Medicare PPO

## 2023-03-09 ENCOUNTER — Inpatient Hospital Stay
Admission: EM | Admit: 2023-03-09 | Discharge: 2023-03-12 | DRG: 853 | Disposition: A | Discharge status: Home / Self Care | Payer: Medicare PPO | Attending: Internal Medicine | Admitting: Internal Medicine

## 2023-03-09 ENCOUNTER — Encounter: Payer: Self-pay | Admitting: Emergency Medicine

## 2023-03-09 ENCOUNTER — Encounter: Payer: Self-pay | Admitting: Family Medicine

## 2023-03-09 ENCOUNTER — Ambulatory Visit: Payer: Medicare PPO | Admitting: Family Medicine

## 2023-03-09 VITALS — BP 112/72 | HR 124 | Ht 68.0 in | Wt 155.0 lb

## 2023-03-09 DIAGNOSIS — Z8249 Family history of ischemic heart disease and other diseases of the circulatory system: Secondary | ICD-10-CM

## 2023-03-09 DIAGNOSIS — K750 Abscess of liver: Secondary | ICD-10-CM | POA: Diagnosis present

## 2023-03-09 DIAGNOSIS — E039 Hypothyroidism, unspecified: Secondary | ICD-10-CM | POA: Diagnosis present

## 2023-03-09 DIAGNOSIS — L02211 Cutaneous abscess of abdominal wall: Secondary | ICD-10-CM | POA: Diagnosis present

## 2023-03-09 DIAGNOSIS — F419 Anxiety disorder, unspecified: Secondary | ICD-10-CM | POA: Diagnosis present

## 2023-03-09 DIAGNOSIS — E785 Hyperlipidemia, unspecified: Secondary | ICD-10-CM | POA: Diagnosis present

## 2023-03-09 DIAGNOSIS — F17200 Nicotine dependence, unspecified, uncomplicated: Secondary | ICD-10-CM | POA: Diagnosis present

## 2023-03-09 DIAGNOSIS — Z7902 Long term (current) use of antithrombotics/antiplatelets: Secondary | ICD-10-CM | POA: Diagnosis not present

## 2023-03-09 DIAGNOSIS — I739 Peripheral vascular disease, unspecified: Secondary | ICD-10-CM | POA: Diagnosis present

## 2023-03-09 DIAGNOSIS — I872 Venous insufficiency (chronic) (peripheral): Secondary | ICD-10-CM | POA: Diagnosis present

## 2023-03-09 DIAGNOSIS — F17210 Nicotine dependence, cigarettes, uncomplicated: Secondary | ICD-10-CM | POA: Diagnosis present

## 2023-03-09 DIAGNOSIS — K436 Other and unspecified ventral hernia with obstruction, without gangrene: Secondary | ICD-10-CM

## 2023-03-09 DIAGNOSIS — Z85820 Personal history of malignant melanoma of skin: Secondary | ICD-10-CM

## 2023-03-09 DIAGNOSIS — K650 Generalized (acute) peritonitis: Secondary | ICD-10-CM | POA: Diagnosis present

## 2023-03-09 DIAGNOSIS — Z9842 Cataract extraction status, left eye: Secondary | ICD-10-CM

## 2023-03-09 DIAGNOSIS — E119 Type 2 diabetes mellitus without complications: Secondary | ICD-10-CM

## 2023-03-09 DIAGNOSIS — E876 Hypokalemia: Secondary | ICD-10-CM | POA: Diagnosis not present

## 2023-03-09 DIAGNOSIS — Z882 Allergy status to sulfonamides status: Secondary | ICD-10-CM

## 2023-03-09 DIAGNOSIS — Z974 Presence of external hearing-aid: Secondary | ICD-10-CM

## 2023-03-09 DIAGNOSIS — Z833 Family history of diabetes mellitus: Secondary | ICD-10-CM | POA: Diagnosis not present

## 2023-03-09 DIAGNOSIS — Z9841 Cataract extraction status, right eye: Secondary | ICD-10-CM

## 2023-03-09 DIAGNOSIS — K5730 Diverticulosis of large intestine without perforation or abscess without bleeding: Secondary | ICD-10-CM | POA: Diagnosis not present

## 2023-03-09 DIAGNOSIS — E1151 Type 2 diabetes mellitus with diabetic peripheral angiopathy without gangrene: Secondary | ICD-10-CM | POA: Diagnosis present

## 2023-03-09 DIAGNOSIS — R109 Unspecified abdominal pain: Secondary | ICD-10-CM | POA: Diagnosis present

## 2023-03-09 DIAGNOSIS — K651 Peritoneal abscess: Secondary | ICD-10-CM | POA: Diagnosis present

## 2023-03-09 DIAGNOSIS — I10 Essential (primary) hypertension: Secondary | ICD-10-CM | POA: Diagnosis present

## 2023-03-09 DIAGNOSIS — F32A Depression, unspecified: Secondary | ICD-10-CM | POA: Diagnosis present

## 2023-03-09 DIAGNOSIS — A419 Sepsis, unspecified organism: Principal | ICD-10-CM | POA: Diagnosis present

## 2023-03-09 DIAGNOSIS — E1165 Type 2 diabetes mellitus with hyperglycemia: Secondary | ICD-10-CM | POA: Diagnosis not present

## 2023-03-09 DIAGNOSIS — Z79890 Hormone replacement therapy (postmenopausal): Secondary | ICD-10-CM | POA: Diagnosis not present

## 2023-03-09 DIAGNOSIS — I700 Atherosclerosis of aorta: Secondary | ICD-10-CM | POA: Diagnosis present

## 2023-03-09 DIAGNOSIS — J449 Chronic obstructive pulmonary disease, unspecified: Secondary | ICD-10-CM | POA: Diagnosis present

## 2023-03-09 DIAGNOSIS — Z860100 Personal history of colon polyps, unspecified: Secondary | ICD-10-CM | POA: Diagnosis not present

## 2023-03-09 DIAGNOSIS — Z9049 Acquired absence of other specified parts of digestive tract: Secondary | ICD-10-CM | POA: Diagnosis not present

## 2023-03-09 DIAGNOSIS — K219 Gastro-esophageal reflux disease without esophagitis: Secondary | ICD-10-CM | POA: Diagnosis present

## 2023-03-09 DIAGNOSIS — R1011 Right upper quadrant pain: Secondary | ICD-10-CM | POA: Diagnosis not present

## 2023-03-09 DIAGNOSIS — Z7984 Long term (current) use of oral hypoglycemic drugs: Secondary | ICD-10-CM | POA: Diagnosis not present

## 2023-03-09 DIAGNOSIS — Z823 Family history of stroke: Secondary | ICD-10-CM | POA: Diagnosis not present

## 2023-03-09 DIAGNOSIS — Z79899 Other long term (current) drug therapy: Secondary | ICD-10-CM

## 2023-03-09 DIAGNOSIS — C439 Malignant melanoma of skin, unspecified: Secondary | ICD-10-CM | POA: Diagnosis not present

## 2023-03-09 DIAGNOSIS — Z961 Presence of intraocular lens: Secondary | ICD-10-CM | POA: Diagnosis present

## 2023-03-09 LAB — BASIC METABOLIC PANEL
Anion gap: 9 (ref 5–15)
BUN: 13 mg/dL (ref 8–23)
CO2: 23 mmol/L (ref 22–32)
Calcium: 8.8 mg/dL — ABNORMAL LOW (ref 8.9–10.3)
Chloride: 102 mmol/L (ref 98–111)
Creatinine, Ser: 0.67 mg/dL (ref 0.61–1.24)
GFR, Estimated: >60 mL/min (ref >60)
Glucose, Bld: 141 mg/dL — ABNORMAL HIGH (ref 70–99)
Potassium: 3.5 mmol/L (ref 3.5–5.1)
Sodium: 134 mmol/L — ABNORMAL LOW (ref 135–145)

## 2023-03-09 LAB — HEPATIC FUNCTION PANEL
ALT: 10 U/L (ref 0–44)
AST: 18 U/L (ref 15–41)
Albumin: 3.6 g/dL (ref 3.5–5.0)
Alkaline Phosphatase: 86 U/L (ref 38–126)
Bilirubin, Direct: <0.1 mg/dL (ref 0.0–0.2)
Total Bilirubin: 0.3 mg/dL (ref <1.2)
Total Protein: 7.6 g/dL (ref 6.5–8.1)

## 2023-03-09 LAB — CBC WITH DIFFERENTIAL/PLATELET
Abs Immature Granulocytes: 0.08 10*3/uL — ABNORMAL HIGH (ref 0.00–0.07)
Basophils Absolute: 0.1 10*3/uL (ref 0.0–0.1)
Basophils Relative: 1 %
Eosinophils Absolute: 0.3 10*3/uL (ref 0.0–0.5)
Eosinophils Relative: 2 %
HCT: 39.4 % (ref 39.0–52.0)
Hemoglobin: 12.8 g/dL — ABNORMAL LOW (ref 13.0–17.0)
Immature Granulocytes: 1 %
Lymphocytes Relative: 11 %
Lymphs Abs: 1.6 10*3/uL (ref 0.7–4.0)
MCH: 27.8 pg (ref 26.0–34.0)
MCHC: 32.5 g/dL (ref 30.0–36.0)
MCV: 85.7 fL (ref 80.0–100.0)
Monocytes Absolute: 1.3 10*3/uL — ABNORMAL HIGH (ref 0.1–1.0)
Monocytes Relative: 9 %
Neutro Abs: 10.7 10*3/uL — ABNORMAL HIGH (ref 1.7–7.7)
Neutrophils Relative %: 76 %
Platelets: 484 10*3/uL — ABNORMAL HIGH (ref 150–400)
RBC: 4.6 MIL/uL (ref 4.22–5.81)
RDW: 15.4 % (ref 11.5–15.5)
WBC: 14 10*3/uL — ABNORMAL HIGH (ref 4.0–10.5)
nRBC: 0 % (ref 0.0–0.2)

## 2023-03-09 MED ORDER — ACETAMINOPHEN 325 MG PO TABS
650.0000 mg | ORAL_TABLET | Freq: Once | ORAL | Status: AC
  Administered 2023-03-09: 650 mg via ORAL
  Filled 2023-03-09: qty 2

## 2023-03-09 MED ORDER — IOHEXOL 300 MG/ML  SOLN
100.0000 mL | Freq: Once | INTRAMUSCULAR | Status: AC | PRN
  Administered 2023-03-09: 100 mL via INTRAVENOUS

## 2023-03-09 MED ORDER — PIPERACILLIN-TAZOBACTAM 3.375 G IVPB 30 MIN
3.3750 g | Freq: Once | INTRAVENOUS | Status: AC
  Administered 2023-03-09: 3.375 g via INTRAVENOUS
  Filled 2023-03-09 (×2): qty 50

## 2023-03-09 NOTE — ED Provider Notes (Signed)
[facility] Emergency Department Provider Note     Event Date/Time   First MD Initiated Contact with Patient 03/09/23 1759     (approximate)   History   Hernia   HPI  Joel Flores is a 72 y.o. male with a history of melanoma, diabetes, HTN, HLD, peripheral vascular disease on Plavix, and prior lap cholecystectomy for a perforated gallbladder (prior to 2018), presents to the ED for evaluation of a rapidly expanding right abdominal wall swelling.  Patient thought he had developed a hernia under one of his prior laparoscopic port incisions.  He would note since August the area began to expand rapidly.  He denies any local trauma, redness, or drainage.  He denies any fevers, chills, sweats but no chest pain, shortness of breath, or bowel changes reported.  He was advised by his primary provider to have the area evaluated.  His oncologist at [facility] had scheduled him for an outpatient CT scan later in November.  He presents to the ED for evaluation noting some mild increased pain to the area.  Physical Exam   Triage Vital Signs: ED Triage Vitals [03/09/23 1643]  Encounter Vitals Group     BP (!) 129/97     Systolic BP Percentile      Diastolic BP Percentile      Pulse Rate (!) 111     Resp 18     Temp 98 F (36.7 C)     Temp Source Oral     SpO2 98 %     Weight 155 lb (70.3 kg)     Height 5\' 7"  (1.702 m)     Head Circumference      Peak Flow      Pain Score 2     Pain Loc      Pain Education      Exclude from Growth Chart     Most recent vital signs: Vitals:   03/09/23 2030 03/09/23 2339  BP: 138/84 129/74  Pulse: 85 80  Resp:  16  Temp:  98 F (36.7 C)  SpO2: 96% 98%    General Awake, no distress. NAD HEENT NCAT. PERRL. EOMI. No rhinorrhea. Mucous membranes are moist.  CV:  Good peripheral perfusion. RRR RESP:  Normal effort. CTA ABD:  No distention.  With a well-defined soft tissue mass to the right upper quadrant of the abdominal wall in  the anterior axillary line.  No overlying skin changes are appreciated.  The area is firm and not mobile.   ED Results / Procedures / Treatments   Labs (all labs ordered are listed, but only abnormal results are displayed) Labs Reviewed  CBC WITH DIFFERENTIAL/PLATELET - Abnormal; Notable for the following components:      Result Value   WBC 14.0 (*)    Hemoglobin 12.8 (*)    Platelets 484 (*)    Neutro Abs 10.7 (*)    Monocytes Absolute 1.3 (*)    Abs Immature Granulocytes 0.08 (*)    All other components within normal limits  BASIC METABOLIC PANEL - Abnormal; Notable for the following components:   Sodium 134 (*)    Glucose, Bld 141 (*)    Calcium 8.8 (*)    All other components within normal limits  HEPATIC FUNCTION PANEL   EKG   RADIOLOGY  I personally viewed and evaluated these images as part of my medical decision making, as well as reviewing the written report by the radiologist.  ED Provider  Interpretation: Perihepatic abscess with extension into the abdominal wall on the right upper quadrant  CT ABDOMEN PELVIS W CONTRAST  Result Date: 03/09/2023 CLINICAL DATA:  Hernia suspected, abdominal wall Started in August and growing in size. History of melanoma. History of perforated cholecystitis. EXAM: CT ABDOMEN AND PELVIS WITH CONTRAST TECHNIQUE: Multidetector CT imaging of the abdomen and pelvis was performed using the standard protocol following bolus administration of intravenous contrast. RADIATION DOSE REDUCTION: This exam was performed according to the departmental dose-optimization program which includes automated exposure control, adjustment of the mA and/or kV according to patient size and/or use of iterative reconstruction technique. CONTRAST:  OMNIPAQUE IOHEXOL 300 MG/ML  SOLN COMPARISON:  CT chest 09/25/2022, ultrasound abdomen 08/17/2016, ultrasound abdomen 12/23/2017 FINDINGS: Lower chest: Trace right pleural effusion. Hepatobiliary: No focal liver  abnormality. Status post cholecystectomy. No biliary dilatation. Pancreas: No focal lesion. Normal pancreatic contour. No surrounding inflammatory changes. No main pancreatic ductal dilatation. Spleen: Normal in size without focal abnormality. Adrenals/Urinary Tract: No adrenal nodule bilaterally. Bilateral kidneys enhance symmetrically. No hydronephrosis. No hydroureter. The urinary bladder is unremarkable. On delayed imaging, there is no urothelial wall thickening and there are no filling defects in the opacified portions of the bilateral collecting systems or ureters. Stomach/Bowel: Stomach is within normal limits. No evidence of bowel wall thickening or dilatation. Colonic diverticulosis. Unremarkable appendix. Vascular/Lymphatic: No abdominal aorta or iliac aneurysm. Severe atherosclerotic plaque of the aorta and its branches. No abdominal, pelvic, or inguinal lymphadenopathy. Reproductive: Prostate is unremarkable. Other: No intraperitoneal free fluid. No intraperitoneal free gas. Interval increase in size of a perihepatic, now fully visualized (previously only partially visualized on CT chest 2021, 2022, 2023), large lobulated fluid density lesion within thick walls and multiple septation as well as several layering calcified stones (2:37). Finding is located along the retroperitoneum and perihepatic space with extension through the right lateral abdominal wall into the right abdominal musculature with largest component measuring up to (8 x 2 x 10 cm). Perihepatic component measures up to at least 13 cm in the craniocaudal dimension and approximately up to 8 x 3 cm on axial imaging. Musculoskeletal: No abdominal wall hernia or abnormality. No suspicious lytic or blastic osseous lesions. No acute displaced fracture. Multilevel degenerative changes of the spine. Mild retrolisthesis of L5 on S1 IMPRESSION: 1. Interval increase in size of a perihepatic multi septated abscess or less likely mass. Etiology likely  due to walled-off dropped gallstones from cholecystectomy/history of perforated cholecystitis. Differential diagnosis does include a malignancy/metastasis. Finding is located along the retroperitoneum and perihepatic space with extension through the right lateral abdominal wall into the right abdominal musculature with largest component measuring up to (8 x 6 x 10 cm). Recommend surgical consultation. 2. Colonic diverticulosis with no acute diverticulitis. 3. Trace right pleural effusion. 4.  Aortic Atherosclerosis (ICD10-I70.0). Electronically Signed   By: Tish Frederickson M.D.   On: 03/09/2023 23:12     PROCEDURES:  Critical Care performed: No  Procedures   MEDICATIONS ORDERED IN ED: Medications  iohexol (OMNIPAQUE) 300 MG/ML solution 100 mL (100 mLs Intravenous Contrast Given 03/09/23 1945)  acetaminophen (TYLENOL) tablet 650 mg (650 mg Oral Given 03/09/23 2258)  piperacillin-tazobactam (ZOSYN) IVPB 3.375 g (3.375 g Intravenous New Bag/Given 03/09/23 2338)     IMPRESSION / MDM / ASSESSMENT AND PLAN / ED COURSE  I reviewed the triage vital signs and the nursing notes.  Differential diagnosis includes, but is not limited to, abscess, tumor/mass, seroma, abdominal wall hernia  Patient's presentation is most consistent with acute complicated illness / injury requiring diagnostic workup.  ----------------------------------------- 11:12 PM on 03/09/2023 ----------------------------------------- S/W Dr. Everlene Farrier: He agrees with surgical intervention.  He would recommend admission to the hospital service, and begin the patient on prophylactic antibiotics.  He will evaluate the patient in the ED for planned surgical intervention.  ----------------------------------------- 11:53 PM on 03/09/2023 ----------------------------------------- Dr. Renaldo Reel at bedside for admission.  Patient's diagnosis is consistent with a perihepatic abscess.  Patient presents to the ED  for evaluation of rapidly expanding soft tissue swelling to the lateral right upper quadrant of the abdominal wall.  Patient will be admitted to the hospital service for further evaluation and planned surgical intervention.  Patient and his wife are agreeable to the plan of care.  FINAL CLINICAL IMPRESSION(S) / ED DIAGNOSES   Final diagnoses:  Perihepatic abscess (HCC)     Rx / DC Orders   ED Discharge Orders     None        Note:  This document was prepared using Dragon voice recognition software and may include unintentional dictation errors.    Lissa Hoard, PA-C 03/10/23 0014    Jene Every, MD 03/12/23 [phone]

## 2023-03-09 NOTE — Patient Instructions (Signed)

## 2023-03-09 NOTE — Progress Notes (Signed)
Date:  03/09/2023   Name:  Joel Flores   DOB:  1950-12-31   MRN:  [id number]   Chief Complaint: Flank Pain (X1 month ago, Right side, Near rib cage, getting worse, burning pain, pressure feeling when touching )  Flank Pain This is a new problem. The current episode started more than 1 month ago. The problem occurs intermittently. The problem has been gradually improving since onset. The quality of the pain is described as aching. The pain does not radiate. Associated symptoms include abdominal pain. Pertinent negatives include no chest pain, dysuria, fever, headaches, pelvic pain, weakness or weight loss. Risk factors include recent trauma (pulling lawn mower string felt pain in abd wall vicinity).    Lab Results  Component Value Date   NA 136 11/27/2022   K 4.1 11/27/2022   CO2 22 11/27/2022   GLUCOSE 96 11/27/2022   BUN 14 11/27/2022   CREATININE 0.64 11/27/2022   CALCIUM 9.0 11/27/2022   EGFR 96 05/06/2022   GFRNONAA >60 11/27/2022   Lab Results  Component Value Date   CHOL 121 05/06/2022   HDL 42 05/06/2022   LDLCALC 50 05/06/2022   TRIG 176 (H) 05/06/2022   CHOLHDL 3.7 06/17/2018   Lab Results  Component Value Date   TSH 1.612 11/27/2022   Lab Results  Component Value Date   HGBA1C 6.8 (H) 11/27/2022   Lab Results  Component Value Date   WBC 8.5 11/11/2018   HGB 15.1 11/11/2018   HCT 44.1 11/11/2018   MCV 86 11/11/2018   PLT 322 11/11/2018   Lab Results  Component Value Date   ALT 29 05/06/2022   AST 39 05/06/2022   ALKPHOS 100 05/06/2022   BILITOT 0.4 05/06/2022   No results found for: "25OHVITD2", "25OHVITD3", "VD25OH"   Review of Systems  Constitutional:  Negative for chills, fever and weight loss.  HENT:  Negative for drooling, ear discharge, ear pain, postnasal drip, rhinorrhea and sore throat.   Respiratory:  Negative for cough, shortness of breath and wheezing.   Cardiovascular:  Negative for chest pain, palpitations and leg swelling.   Gastrointestinal:  Positive for abdominal pain. Negative for blood in stool, constipation, diarrhea and nausea.  Endocrine: Negative for polydipsia.  Genitourinary:  Positive for flank pain. Negative for dysuria, frequency, hematuria, pelvic pain, penile swelling and urgency.  Musculoskeletal:  Negative for back pain, myalgias and neck pain.  Skin:  Negative for rash.  Allergic/Immunologic: Negative for environmental allergies.  Neurological:  Negative for dizziness, weakness and headaches.  Hematological:  Does not bruise/bleed easily.  Psychiatric/Behavioral:  Negative for suicidal ideas. The patient is not nervous/anxious.     Patient Active Problem List   Diagnosis Date Noted   Bilateral carotid artery stenosis 09/20/2019   Colon cancer screening    Polyp of sigmoid colon    SOBOE (shortness of breath on exertion) 07/30/2017   PVD (peripheral vascular disease) (HCC) 11/17/2016   Tobacco use disorder 11/17/2016   Reactive airway disease, mild intermittent, uncomplicated 08/12/2016   Allergic rhinitis due to pollen 08/13/2015   Nocturia 08/13/2015   Atherosclerosis of aorta (HCC) 02/12/2015   Essential hypertension 02/12/2015   Esophageal reflux 02/12/2015   Depression 02/12/2015   Adult hypothyroidism 02/12/2015   Chronic anxiety 02/12/2015   Hyperlipemia 02/12/2015    Allergies  Allergen Reactions   Sulfa Antibiotics Rash    Past Surgical History:  Procedure Laterality Date   CATARACT EXTRACTION W/PHACO Right 03/16/2018   Procedure: CATARACT EXTRACTION  PHACO AND INTRAOCULAR LENS PLACEMENT (IOC);  Surgeon: Elliot Cousin, MD;  Location: [facility];  Service: Ophthalmology;  Laterality: Right;  [place]  01:05 CDE 11.85 Fluid pack lot # [id number]   CATARACT EXTRACTION W/PHACO Left 05/08/2020   Procedure: CATARACT EXTRACTION PHACO AND INTRAOCULAR LENS PLACEMENT (IOC) LEFT 7.32 00:54.7 13.4%;  Surgeon: Lockie Mola, MD;  Location: [facility];  Service:  Ophthalmology;  Laterality: Left;   CHOLECYSTECTOMY     COLONOSCOPY  2015   Dr Servando Snare- cleared for 5 years    COLONOSCOPY WITH PROPOFOL N/A 01/17/2018   Procedure: COLONOSCOPY WITH PROPOFOL;  Surgeon: Midge Minium, MD;  Location: [facility];  Service: Endoscopy;  Laterality: N/A;  requests early   HERNIA REPAIR  July 2019   INSERTION OF MESH N/A 08/31/2017   Procedure: INSERTION OF MESH;  Surgeon: Ancil Linsey, MD;  Location: [facility];  Service: General;  Laterality: N/A;   POLYPECTOMY  01/17/2018   Procedure: POLYPECTOMY;  Surgeon: Midge Minium, MD;  Location: [facility];  Service: Endoscopy;;   UMBILICAL HERNIA REPAIR N/A 08/31/2017   Procedure: HERNIA REPAIR UMBILICAL ADULT;  Surgeon: Ancil Linsey, MD;  Location: [facility];  Service: General;  Laterality: N/A;   VEIN SURGERY     2 stents in R) leg    Social History   Tobacco Use   Smoking status: Every Day    Current packs/day: 1.50    Average packs/day: 1.5 packs/day for 50.0 years (75.0 ttl pk-yrs)    Types: Cigarettes   Smokeless tobacco: Never  Vaping Use   Vaping status: Never Used  Substance Use Topics   Alcohol use: Yes    Comment: once a month   Drug use: No     Medication list has been reviewed and updated.  Current Meds  Medication Sig   acetaminophen (TYLENOL) 325 MG tablet Take 325 mg by mouth daily as needed for moderate pain or headache.   albuterol (VENTOLIN HFA) 108 (90 Base) MCG/ACT inhaler Inhale into the lungs. Inhale 2 inhalations into the lungs 4 (four) times daily PRN   amLODipine (NORVASC) 5 MG tablet Take 5 mg by mouth daily.   azelastine (ASTELIN) 0.1 % nasal spray Place 2 sprays into both nostrils 2 (two) times daily. Use in each nostril as directed   cetirizine (ZYRTEC) 10 MG tablet Take 1 tablet (10 mg total) by mouth daily.   clopidogrel (PLAVIX) 75 MG tablet Take 1 tablet (75 mg total) by mouth daily.   fluticasone (FLONASE) 50 MCG/ACT nasal spray SPRAY 2 SPRAYS  INTO EACH NOSTRIL EVERY DAY   ibuprofen (ADVIL,MOTRIN) 200 MG tablet Take 400 mg by mouth every 8 (eight) hours as needed (for pain).   levothyroxine (SYNTHROID) 112 MCG tablet TAKE 1 TABLET BY MOUTH EVERY DAY   losartan (COZAAR) 100 MG tablet Take 100 mg by mouth daily.   metFORMIN (GLUCOPHAGE) 500 MG tablet TAKE 1 TABLET BY MOUTH EVERY DAY WITH BREAKFAST   pantoprazole (PROTONIX) 40 MG tablet Take 1 tablet (40 mg total) by mouth daily.   rosuvastatin (CRESTOR) 40 MG tablet Take 40 mg by mouth daily.   sertraline (ZOLOFT) 100 MG tablet Take 1 tablet (100 mg total) by mouth daily.       03/09/2023    3:13 PM 11/27/2022    9:46 AM 10/27/2022    3:10 PM 09/04/2022    8:34 AM  GAD 7 : Generalized Anxiety Score  Nervous, Anxious, on Edge 1 0 0 0  Control/stop worrying 1 0 0 0  Worry too much - different things 1 0 0 0  Trouble relaxing 0 0 0 0  Restless 0 0 0 0  Easily annoyed or irritable 0 0 0 0  Afraid - awful might happen 0 0 0 0  Total GAD 7 Score 3 0 0 0  Anxiety Difficulty Not difficult at all Not difficult at all Not difficult at all Not difficult at all       03/09/2023    3:13 PM 11/27/2022    9:46 AM 10/27/2022    3:10 PM  Depression screen PHQ 2/9  Decreased Interest 0 0 0  Down, Depressed, Hopeless 0 0 0  PHQ - 2 Score 0 0 0  Altered sleeping 0 0 0  Tired, decreased energy 0 0 0  Change in appetite 0 0 0  Feeling bad or failure about yourself  0 0 0  Trouble concentrating 0 0 0  Moving slowly or fidgety/restless 0 0 0  Suicidal thoughts 0 0 0  PHQ-9 Score 0 0 0  Difficult doing work/chores Not difficult at all Not difficult at all Not difficult at all    BP Readings from Last 3 Encounters:  03/09/23 112/72  11/27/22 122/80  10/27/22 128/78    Physical Exam Vitals and nursing note reviewed.  HENT:     Head: Normocephalic.     Right Ear: Tympanic membrane and external ear normal. There is no impacted cerumen.     Left Ear: Tympanic membrane and external ear  normal. There is no impacted cerumen.     Nose: Nose normal.     Mouth/Throat:     Mouth: Mucous membranes are moist.  Eyes:     General: No scleral icterus.       Right eye: No discharge.        Left eye: No discharge.     Conjunctiva/sclera: Conjunctivae normal.     Pupils: Pupils are equal, round, and reactive to light.  Neck:     Thyroid: No thyromegaly.     Vascular: No JVD.     Trachea: No tracheal deviation.  Cardiovascular:     Rate and Rhythm: Normal rate and regular rhythm.     Heart sounds: Normal heart sounds. No murmur heard.    No friction rub. No gallop.  Pulmonary:     Effort: No respiratory distress.     Breath sounds: Normal breath sounds. No wheezing, rhonchi or rales.  Abdominal:     General: Bowel sounds are normal.     Palpations: Abdomen is soft. There is no mass.     Tenderness: There is no abdominal tenderness. There is no guarding or rebound.       Comments: Abd wall hernia/nonreducable  Musculoskeletal:        General: No tenderness. Normal range of motion.     Cervical back: Normal range of motion and neck supple.  Lymphadenopathy:     Cervical: No cervical adenopathy.  Skin:    General: Skin is warm.     Findings: No rash.  Neurological:     Mental Status: He is alert and oriented to person, place, and time.     Cranial Nerves: No cranial nerve deficit.     Deep Tendon Reflexes: Reflexes are normal and symmetric.     Wt Readings from Last 3 Encounters:  03/09/23 155 lb (70.3 kg)  11/27/22 160 lb (72.6 kg)  10/27/22 159 lb (72.1 kg)  BP 112/72   Pulse (!) 124   Ht 5\' 8"  (1.727 m)   Wt 155 lb (70.3 kg)   SpO2 98%   BMI 23.57 kg/m   Assessment and Plan: 1. Strangulated hernia of abdominal wall New onset from 4 months ago when patient experienced a strain to the flank area when trying to pull a string of a lawnmower that got stuck mid pole.  Patient experienced pain rib x-rays were negative for fracture but over the course of 4  months it went from the size of a small bump to ping-pong sized to tennis size to now is about the size of a large Nerf ball.  Furthermore it is no longer reducible by the patient and it is firm with tenderness and pain with trying to reduce at this time.  At this time I feel like this is becoming nonreducible perhaps strangulated hernia which is at risk for developing obstruction.  We will refer to the emergency room for him emergency evaluation and possible CT scan for evaluation.  Patient has an upcoming appointment that his cancer doctor from melanoma noticed this and serendipitously arranged for him on November 25 to have a CT and see a Careers adviser but I do not think it would be prudent to wait at that time.    Elizabeth Sauer, MD

## 2023-03-09 NOTE — ED Provider Notes (Incomplete)
[facility] Emergency Department Provider Note     Event Date/Time   First MD Initiated Contact with Patient 03/09/23 1759     (approximate)   History   Hernia   HPI  Joel Flores is a 72 y.o. male with a history of melanoma, diabetes, HTN, HLD, peripheral vascular disease on Plavix, and prior lap cholecystectomy for a perforated gallbladder (prior to 2018), presents to the ED for evaluation of a rapidly expanding right abdominal wall swelling.  Patient thought he had developed a hernia under one of his prior laparoscopic port incisions.  He would note since August the area began to expand rapidly.  He denies any local trauma, redness, or drainage.  He denies any fevers, chills, sweats but no chest pain, shortness of breath, or bowel changes reported.  He was advised by his primary provider to have the area evaluated.  His oncologist at [facility] had scheduled him for an outpatient CT scan later in November.  He presents to the ED for evaluation noting some mild increased pain to the area.  Physical Exam   Triage Vital Signs: ED Triage Vitals [03/09/23 1643]  Encounter Vitals Group     BP (!) 129/97     Systolic BP Percentile      Diastolic BP Percentile      Pulse Rate (!) 111     Resp 18     Temp 98 F (36.7 C)     Temp Source Oral     SpO2 98 %     Weight 155 lb (70.3 kg)     Height 5\' 7"  (1.702 m)     Head Circumference      Peak Flow      Pain Score 2     Pain Loc      Pain Education      Exclude from Growth Chart     Most recent vital signs: Vitals:   03/09/23 2030 03/09/23 2339  BP: 138/84 129/74  Pulse: 85 80  Resp:  16  Temp:  98 F (36.7 C)  SpO2: 96% 98%    General Awake, no distress. NAD HEENT NCAT. PERRL. EOMI. No rhinorrhea. Mucous membranes are moist.  CV:  Good peripheral perfusion. RRR RESP:  Normal effort. CTA ABD:  No distention.  With a well-defined soft tissue mass to the right upper quadrant of the abdominal wall in  the anterior axillary line.  No overlying skin changes are appreciated.  The area is firm and not mobile.   ED Results / Procedures / Treatments   Labs (all labs ordered are listed, but only abnormal results are displayed) Labs Reviewed  CBC WITH DIFFERENTIAL/PLATELET - Abnormal; Notable for the following components:      Result Value   WBC 14.0 (*)    Hemoglobin 12.8 (*)    Platelets 484 (*)    Neutro Abs 10.7 (*)    Monocytes Absolute 1.3 (*)    Abs Immature Granulocytes 0.08 (*)    All other components within normal limits  BASIC METABOLIC PANEL - Abnormal; Notable for the following components:   Sodium 134 (*)    Glucose, Bld 141 (*)    Calcium 8.8 (*)    All other components within normal limits  HEPATIC FUNCTION PANEL   EKG   RADIOLOGY  I personally viewed and evaluated these images as part of my medical decision making, as well as reviewing the written report by the radiologist.  ED Provider  Interpretation: Perihepatic abscess with extension into the abdominal wall on the right upper quadrant  CT ABDOMEN PELVIS W CONTRAST  Result Date: 03/09/2023 CLINICAL DATA:  Hernia suspected, abdominal wall Started in August and growing in size. History of melanoma. History of perforated cholecystitis. EXAM: CT ABDOMEN AND PELVIS WITH CONTRAST TECHNIQUE: Multidetector CT imaging of the abdomen and pelvis was performed using the standard protocol following bolus administration of intravenous contrast. RADIATION DOSE REDUCTION: This exam was performed according to the departmental dose-optimization program which includes automated exposure control, adjustment of the mA and/or kV according to patient size and/or use of iterative reconstruction technique. CONTRAST:  OMNIPAQUE IOHEXOL 300 MG/ML  SOLN COMPARISON:  CT chest 09/25/2022, ultrasound abdomen 08/17/2016, ultrasound abdomen 12/23/2017 FINDINGS: Lower chest: Trace right pleural effusion. Hepatobiliary: No focal liver  abnormality. Status post cholecystectomy. No biliary dilatation. Pancreas: No focal lesion. Normal pancreatic contour. No surrounding inflammatory changes. No main pancreatic ductal dilatation. Spleen: Normal in size without focal abnormality. Adrenals/Urinary Tract: No adrenal nodule bilaterally. Bilateral kidneys enhance symmetrically. No hydronephrosis. No hydroureter. The urinary bladder is unremarkable. On delayed imaging, there is no urothelial wall thickening and there are no filling defects in the opacified portions of the bilateral collecting systems or ureters. Stomach/Bowel: Stomach is within normal limits. No evidence of bowel wall thickening or dilatation. Colonic diverticulosis. Unremarkable appendix. Vascular/Lymphatic: No abdominal aorta or iliac aneurysm. Severe atherosclerotic plaque of the aorta and its branches. No abdominal, pelvic, or inguinal lymphadenopathy. Reproductive: Prostate is unremarkable. Other: No intraperitoneal free fluid. No intraperitoneal free gas. Interval increase in size of a perihepatic, now fully visualized (previously only partially visualized on CT chest 2021, 2022, 2023), large lobulated fluid density lesion within thick walls and multiple septation as well as several layering calcified stones (2:37). Finding is located along the retroperitoneum and perihepatic space with extension through the right lateral abdominal wall into the right abdominal musculature with largest component measuring up to (8 x 2 x 10 cm). Perihepatic component measures up to at least 13 cm in the craniocaudal dimension and approximately up to 8 x 3 cm on axial imaging. Musculoskeletal: No abdominal wall hernia or abnormality. No suspicious lytic or blastic osseous lesions. No acute displaced fracture. Multilevel degenerative changes of the spine. Mild retrolisthesis of L5 on S1 IMPRESSION: 1. Interval increase in size of a perihepatic multi septated abscess or less likely mass. Etiology likely  due to walled-off dropped gallstones from cholecystectomy/history of perforated cholecystitis. Differential diagnosis does include a malignancy/metastasis. Finding is located along the retroperitoneum and perihepatic space with extension through the right lateral abdominal wall into the right abdominal musculature with largest component measuring up to (8 x 6 x 10 cm). Recommend surgical consultation. 2. Colonic diverticulosis with no acute diverticulitis. 3. Trace right pleural effusion. 4.  Aortic Atherosclerosis (ICD10-I70.0). Electronically Signed   By: Tish Frederickson M.D.   On: 03/09/2023 23:12     PROCEDURES:  Critical Care performed: No  Procedures   MEDICATIONS ORDERED IN ED: Medications  piperacillin-tazobactam (ZOSYN) IVPB 3.375 g (3.375 g Intravenous New Bag/Given 03/09/23 2338)  iohexol (OMNIPAQUE) 300 MG/ML solution 100 mL (100 mLs Intravenous Contrast Given 03/09/23 1945)  acetaminophen (TYLENOL) tablet 650 mg (650 mg Oral Given 03/09/23 2258)     IMPRESSION / MDM / ASSESSMENT AND PLAN / ED COURSE  I reviewed the triage vital signs and the nursing notes.  Differential diagnosis includes, but is not limited to, abscess, tumor/mass, seroma, abdominal wall hernia  Patient's presentation is most consistent with acute complicated illness / injury requiring diagnostic workup.  ----------------------------------------- 11:12 PM on 03/09/2023 ----------------------------------------- S/W Dr. Everlene Farrier: He agrees with surgical intervention.  He would recommend admission to the hospital service, and begin the patient on prophylactic antibiotics.  He will evaluate the patient in the ED for planned surgical intervention.  ----------------------------------------- 11:53 PM on 03/09/2023 ----------------------------------------- Dr. Renaldo Reel at bedside for admission.  Patient's diagnosis is consistent with a perihepatic abscess.  Patient presents to the ED  for evaluation of rapidly expanding soft tissue swelling to the lateral right upper quadrant of the abdominal wall.  Patient will be admitted to the hospital service for further evaluation and planned surgical intervention.  Patient and his wife are agreeable to the plan of care.  FINAL CLINICAL IMPRESSION(S) / ED DIAGNOSES   Final diagnoses:  Perihepatic abscess (HCC)     Rx / DC Orders   ED Discharge Orders     None        Note:  This document was prepared using Dragon voice recognition software and may include unintentional dictation errors.

## 2023-03-09 NOTE — ED Notes (Signed)
Report given to April RN. This RN updated April, RN that pt is waiting on CT results. Pt alert and oriented, wife at bedside.

## 2023-03-09 NOTE — ED Notes (Signed)
Md at bedside

## 2023-03-09 NOTE — Addendum Note (Signed)
Addended by: Duanne Limerick on: 03/09/2023 04:34 PM   Modules accepted: Level of Service

## 2023-03-09 NOTE — ED Triage Notes (Signed)
Patient to ED via POV for hernia. Sent by PCP- for further evaluation from surgeon. Started in August and growing in size.

## 2023-03-10 ENCOUNTER — Encounter
Admission: EM | Disposition: A | Discharge status: Home / Self Care | Payer: Self-pay | Source: Home / Self Care | Attending: Internal Medicine

## 2023-03-10 ENCOUNTER — Inpatient Hospital Stay: Payer: Medicare PPO | Admitting: Anesthesiology

## 2023-03-10 ENCOUNTER — Other Ambulatory Visit: Payer: Self-pay

## 2023-03-10 ENCOUNTER — Encounter: Payer: Self-pay | Admitting: Internal Medicine

## 2023-03-10 DIAGNOSIS — K750 Abscess of liver: Secondary | ICD-10-CM | POA: Diagnosis present

## 2023-03-10 DIAGNOSIS — Z823 Family history of stroke: Secondary | ICD-10-CM | POA: Diagnosis not present

## 2023-03-10 DIAGNOSIS — E785 Hyperlipidemia, unspecified: Secondary | ICD-10-CM | POA: Diagnosis present

## 2023-03-10 DIAGNOSIS — Z8249 Family history of ischemic heart disease and other diseases of the circulatory system: Secondary | ICD-10-CM | POA: Diagnosis not present

## 2023-03-10 DIAGNOSIS — A419 Sepsis, unspecified organism: Secondary | ICD-10-CM | POA: Diagnosis present

## 2023-03-10 DIAGNOSIS — K650 Generalized (acute) peritonitis: Secondary | ICD-10-CM

## 2023-03-10 DIAGNOSIS — F419 Anxiety disorder, unspecified: Secondary | ICD-10-CM | POA: Diagnosis present

## 2023-03-10 DIAGNOSIS — Z833 Family history of diabetes mellitus: Secondary | ICD-10-CM | POA: Diagnosis not present

## 2023-03-10 DIAGNOSIS — R109 Unspecified abdominal pain: Secondary | ICD-10-CM | POA: Diagnosis present

## 2023-03-10 DIAGNOSIS — L02211 Cutaneous abscess of abdominal wall: Secondary | ICD-10-CM | POA: Diagnosis not present

## 2023-03-10 DIAGNOSIS — Z860100 Personal history of colon polyps, unspecified: Secondary | ICD-10-CM | POA: Diagnosis not present

## 2023-03-10 DIAGNOSIS — Z85820 Personal history of malignant melanoma of skin: Secondary | ICD-10-CM | POA: Diagnosis not present

## 2023-03-10 DIAGNOSIS — Z974 Presence of external hearing-aid: Secondary | ICD-10-CM | POA: Diagnosis not present

## 2023-03-10 DIAGNOSIS — Z79890 Hormone replacement therapy (postmenopausal): Secondary | ICD-10-CM | POA: Diagnosis not present

## 2023-03-10 DIAGNOSIS — E119 Type 2 diabetes mellitus without complications: Secondary | ICD-10-CM

## 2023-03-10 DIAGNOSIS — F32A Depression, unspecified: Secondary | ICD-10-CM | POA: Diagnosis present

## 2023-03-10 DIAGNOSIS — K651 Peritoneal abscess: Secondary | ICD-10-CM | POA: Diagnosis present

## 2023-03-10 DIAGNOSIS — R1011 Right upper quadrant pain: Secondary | ICD-10-CM | POA: Diagnosis not present

## 2023-03-10 DIAGNOSIS — J449 Chronic obstructive pulmonary disease, unspecified: Secondary | ICD-10-CM | POA: Diagnosis present

## 2023-03-10 DIAGNOSIS — Z882 Allergy status to sulfonamides status: Secondary | ICD-10-CM | POA: Diagnosis not present

## 2023-03-10 DIAGNOSIS — E1151 Type 2 diabetes mellitus with diabetic peripheral angiopathy without gangrene: Secondary | ICD-10-CM | POA: Diagnosis present

## 2023-03-10 DIAGNOSIS — Z7902 Long term (current) use of antithrombotics/antiplatelets: Secondary | ICD-10-CM | POA: Diagnosis not present

## 2023-03-10 DIAGNOSIS — E876 Hypokalemia: Secondary | ICD-10-CM | POA: Diagnosis not present

## 2023-03-10 DIAGNOSIS — I700 Atherosclerosis of aorta: Secondary | ICD-10-CM | POA: Diagnosis present

## 2023-03-10 DIAGNOSIS — E039 Hypothyroidism, unspecified: Secondary | ICD-10-CM | POA: Diagnosis present

## 2023-03-10 DIAGNOSIS — F17210 Nicotine dependence, cigarettes, uncomplicated: Secondary | ICD-10-CM | POA: Diagnosis not present

## 2023-03-10 DIAGNOSIS — Z7984 Long term (current) use of oral hypoglycemic drugs: Secondary | ICD-10-CM | POA: Diagnosis not present

## 2023-03-10 DIAGNOSIS — I10 Essential (primary) hypertension: Secondary | ICD-10-CM | POA: Diagnosis not present

## 2023-03-10 HISTORY — DX: Type 2 diabetes mellitus without complications: E11.9

## 2023-03-10 HISTORY — PX: IRRIGATION AND DEBRIDEMENT ABSCESS: SHX5252

## 2023-03-10 LAB — PROTIME-INR
INR: 1.1 (ref 0.8–1.2)
Prothrombin Time: 14.4 s (ref 11.4–15.2)

## 2023-03-10 LAB — CBC
HCT: 34.3 % — ABNORMAL LOW (ref 39.0–52.0)
Hemoglobin: 11.6 g/dL — ABNORMAL LOW (ref 13.0–17.0)
MCH: 28 pg (ref 26.0–34.0)
MCHC: 33.8 g/dL (ref 30.0–36.0)
MCV: 82.9 fL (ref 80.0–100.0)
Platelets: 388 10*3/uL (ref 150–400)
RBC: 4.14 MIL/uL — ABNORMAL LOW (ref 4.22–5.81)
RDW: 15.1 % (ref 11.5–15.5)
WBC: 11.2 10*3/uL — ABNORMAL HIGH (ref 4.0–10.5)
nRBC: 0 % (ref 0.0–0.2)

## 2023-03-10 LAB — COMPREHENSIVE METABOLIC PANEL
ALT: 9 U/L (ref 0–44)
AST: 15 U/L (ref 15–41)
Albumin: 3.4 g/dL — ABNORMAL LOW (ref 3.5–5.0)
Alkaline Phosphatase: 78 U/L (ref 38–126)
Anion gap: 7 (ref 5–15)
BUN: 9 mg/dL (ref 8–23)
CO2: 24 mmol/L (ref 22–32)
Calcium: 8.7 mg/dL — ABNORMAL LOW (ref 8.9–10.3)
Chloride: 105 mmol/L (ref 98–111)
Creatinine, Ser: 0.58 mg/dL — ABNORMAL LOW (ref 0.61–1.24)
GFR, Estimated: >60 mL/min (ref >60)
Glucose, Bld: 111 mg/dL — ABNORMAL HIGH (ref 70–99)
Potassium: 3.4 mmol/L — ABNORMAL LOW (ref 3.5–5.1)
Sodium: 136 mmol/L (ref 135–145)
Total Bilirubin: 0.4 mg/dL (ref <1.2)
Total Protein: 6.9 g/dL (ref 6.5–8.1)

## 2023-03-10 LAB — HEMOGLOBIN A1C
Hgb A1c MFr Bld: 6.7 % — ABNORMAL HIGH (ref 4.8–5.6)
Mean Plasma Glucose: 145.59 mg/dL

## 2023-03-10 LAB — GLUCOSE, CAPILLARY
Glucose-Capillary: 118 mg/dL — ABNORMAL HIGH (ref 70–99)
Glucose-Capillary: 120 mg/dL — ABNORMAL HIGH (ref 70–99)

## 2023-03-10 LAB — LACTIC ACID, PLASMA
Lactic Acid, Venous: 2.1 mmol/L (ref 0.5–1.9)
Lactic Acid, Venous: 1 mmol/L (ref 0.5–1.9)

## 2023-03-10 SURGERY — IRRIGATION AND DEBRIDEMENT ABSCESS
Anesthesia: General

## 2023-03-10 MED ORDER — EPHEDRINE SULFATE-NACL 50-0.9 MG/10ML-% IV SOSY
PREFILLED_SYRINGE | INTRAVENOUS | Status: DC | PRN
  Administered 2023-03-10: 10 mg via INTRAVENOUS

## 2023-03-10 MED ORDER — OXYCODONE HCL 5 MG PO TABS: 5.0000 mg | ORAL_TABLET | Freq: Once | ORAL | Status: DC | PRN

## 2023-03-10 MED ORDER — ACETAMINOPHEN 650 MG RE SUPP
650.0000 mg | Freq: Four times a day (QID) | RECTAL | Status: DC | PRN
Start: 2023-03-10 — End: 2023-03-12

## 2023-03-10 MED ORDER — OXYCODONE HCL 5 MG/5ML PO SOLN: 5.0000 mg | Freq: Once | ORAL | Status: DC | PRN

## 2023-03-10 MED ORDER — CHLORHEXIDINE GLUCONATE 0.12 % MT SOLN
OROMUCOSAL | Status: AC
  Filled 2023-03-10: qty 15

## 2023-03-10 MED ORDER — CHLORHEXIDINE GLUCONATE CLOTH 2 % EX PADS
6.0000 | MEDICATED_PAD | Freq: Every day | CUTANEOUS | Status: DC
  Administered 2023-03-10 – 2023-03-12 (×3): 6 via TOPICAL

## 2023-03-10 MED ORDER — PROPOFOL 1000 MG/100ML IV EMUL
INTRAVENOUS | Status: AC
  Filled 2023-03-10: qty 100

## 2023-03-10 MED ORDER — ALBUTEROL SULFATE HFA 108 (90 BASE) MCG/ACT IN AERS: 1.0000 | INHALATION_SPRAY | Freq: Four times a day (QID) | RESPIRATORY_TRACT | Status: DC | PRN

## 2023-03-10 MED ORDER — LACTATED RINGERS IV BOLUS
1000.0000 mL | Freq: Once | INTRAVENOUS | Status: AC
  Administered 2023-03-10: 1000 mL via INTRAVENOUS

## 2023-03-10 MED ORDER — ONDANSETRON HCL 4 MG/2ML IJ SOLN
INTRAMUSCULAR | Status: DC | PRN
  Administered 2023-03-10: 4 mg via INTRAVENOUS

## 2023-03-10 MED ORDER — SODIUM CHLORIDE 0.9 % IV SOLN: INTRAVENOUS | Status: DC

## 2023-03-10 MED ORDER — SODIUM CHLORIDE 0.9% FLUSH
3.0000 mL | Freq: Two times a day (BID) | INTRAVENOUS | Status: DC
  Administered 2023-03-10 – 2023-03-11 (×3): 3 mL via INTRAVENOUS

## 2023-03-10 MED ORDER — PROPOFOL 10 MG/ML IV BOLUS
INTRAVENOUS | Status: DC | PRN
  Administered 2023-03-10: 120 ug/kg/min via INTRAVENOUS

## 2023-03-10 MED ORDER — ACETAMINOPHEN 325 MG PO TABS: 325.0000 mg | ORAL_TABLET | Freq: Every day | ORAL | Status: DC | PRN

## 2023-03-10 MED ORDER — FENTANYL CITRATE (PF) 100 MCG/2ML IJ SOLN
INTRAMUSCULAR | Status: DC | PRN
  Administered 2023-03-10 (×2): 50 ug via INTRAVENOUS

## 2023-03-10 MED ORDER — MORPHINE SULFATE (PF) 2 MG/ML IV SOLN: 2.0000 mg | INTRAVENOUS | Status: DC | PRN

## 2023-03-10 MED ORDER — ACETAMINOPHEN 325 MG PO TABS
650.0000 mg | ORAL_TABLET | Freq: Four times a day (QID) | ORAL | Status: DC | PRN
  Administered 2023-03-10 – 2023-03-11 (×2): 650 mg via ORAL
  Filled 2023-03-10 (×2): qty 2

## 2023-03-10 MED ORDER — ROSUVASTATIN CALCIUM 10 MG PO TABS
40.0000 mg | ORAL_TABLET | Freq: Every day | ORAL | Status: DC
  Administered 2023-03-10 – 2023-03-12 (×3): 40 mg via ORAL
  Filled 2023-03-10 (×3): qty 4

## 2023-03-10 MED ORDER — SERTRALINE HCL 50 MG PO TABS
100.0000 mg | ORAL_TABLET | Freq: Every day | ORAL | Status: DC
  Administered 2023-03-10 – 2023-03-12 (×3): 100 mg via ORAL
  Filled 2023-03-10 (×3): qty 2

## 2023-03-10 MED ORDER — CHLORHEXIDINE GLUCONATE 0.12 % MT SOLN
15.0000 mL | Freq: Once | OROMUCOSAL | Status: AC
  Administered 2023-03-10: 15 mL via OROMUCOSAL

## 2023-03-10 MED ORDER — NICOTINE 21 MG/24HR TD PT24
21.0000 mg | MEDICATED_PATCH | Freq: Every day | TRANSDERMAL | Status: DC
  Administered 2023-03-10 – 2023-03-11 (×2): 21 mg via TRANSDERMAL
  Filled 2023-03-10 (×2): qty 1

## 2023-03-10 MED ORDER — PIPERACILLIN-TAZOBACTAM 3.375 G IVPB 30 MIN: 3.3750 g | Freq: Four times a day (QID) | INTRAVENOUS | Status: DC

## 2023-03-10 MED ORDER — LOSARTAN POTASSIUM 50 MG PO TABS
100.0000 mg | ORAL_TABLET | Freq: Every day | ORAL | Status: DC
  Administered 2023-03-10 – 2023-03-12 (×3): 100 mg via ORAL
  Filled 2023-03-10 (×3): qty 2

## 2023-03-10 MED ORDER — PHENYLEPHRINE 80 MCG/ML (10ML) SYRINGE FOR IV PUSH (FOR BLOOD PRESSURE SUPPORT)
PREFILLED_SYRINGE | INTRAVENOUS | Status: DC | PRN
  Administered 2023-03-10: 80 ug via INTRAVENOUS
  Administered 2023-03-10: 160 ug via INTRAVENOUS
  Administered 2023-03-10: 80 ug via INTRAVENOUS

## 2023-03-10 MED ORDER — FLUTICASONE PROPIONATE 50 MCG/ACT NA SUSP
1.0000 | Freq: Every day | NASAL | Status: DC
Start: 2023-03-10 — End: 2023-03-12
  Administered 2023-03-10 – 2023-03-12 (×3): 1 via NASAL
  Filled 2023-03-10 (×2): qty 16

## 2023-03-10 MED ORDER — FENTANYL CITRATE (PF) 100 MCG/2ML IJ SOLN
INTRAMUSCULAR | Status: AC
  Filled 2023-03-10: qty 2

## 2023-03-10 MED ORDER — FENTANYL CITRATE (PF) 100 MCG/2ML IJ SOLN: 25.0000 ug | INTRAMUSCULAR | Status: DC | PRN

## 2023-03-10 MED ORDER — PIPERACILLIN-TAZOBACTAM 3.375 G IVPB
3.3750 g | Freq: Three times a day (TID) | INTRAVENOUS | Status: DC
  Administered 2023-03-10 – 2023-03-12 (×7): 3.375 g via INTRAVENOUS
  Filled 2023-03-10 (×7): qty 50

## 2023-03-10 MED ORDER — 0.9 % SODIUM CHLORIDE (POUR BTL) OPTIME
TOPICAL | Status: DC | PRN
  Administered 2023-03-10: 1000 mL

## 2023-03-10 MED ORDER — ALBUTEROL SULFATE (2.5 MG/3ML) 0.083% IN NEBU: 2.5000 mg | INHALATION_SOLUTION | RESPIRATORY_TRACT | Status: DC | PRN

## 2023-03-10 MED ORDER — OXYCODONE HCL 5 MG PO TABS: 5.0000 mg | ORAL_TABLET | ORAL | Status: DC | PRN

## 2023-03-10 MED ORDER — AMLODIPINE BESYLATE 5 MG PO TABS
5.0000 mg | ORAL_TABLET | Freq: Every day | ORAL | Status: DC
  Administered 2023-03-10 – 2023-03-12 (×3): 5 mg via ORAL
  Filled 2023-03-10 (×3): qty 1

## 2023-03-10 MED ORDER — LEVOTHYROXINE SODIUM 112 MCG PO TABS
112.0000 ug | ORAL_TABLET | Freq: Every day | ORAL | Status: DC
  Administered 2023-03-11 – 2023-03-12 (×2): 112 ug via ORAL
  Filled 2023-03-10 (×3): qty 1

## 2023-03-10 SURGICAL SUPPLY — 21 items
BLADE CLIPPER SURG (BLADE) ×1 IMPLANT
BLADE SURG 15 STRL LF DISP TIS (BLADE) ×1 IMPLANT
BLADE SURG 15 STRL SS (BLADE) ×1
BRUSH SCRUB EZ 4% CHG (MISCELLANEOUS) ×1 IMPLANT
DRAPE LAPAROTOMY 77X122 PED (DRAPES) ×1 IMPLANT
ELECT REM PT RETURN 9FT ADLT (ELECTROSURGICAL) ×1
ELECTRODE REM PT RTRN 9FT ADLT (ELECTROSURGICAL) ×1 IMPLANT
GAUZE 4X4 16PLY ~~LOC~~+RFID DBL (SPONGE) ×1 IMPLANT
GLOVE BIO SURGEON STRL SZ7 (GLOVE) ×1 IMPLANT
GOWN STRL REUS W/ TWL LRG LVL3 (GOWN DISPOSABLE) ×2 IMPLANT
GOWN STRL REUS W/TWL LRG LVL3 (GOWN DISPOSABLE) ×2
MANIFOLD NEPTUNE II (INSTRUMENTS) ×1 IMPLANT
NDL HYPO 22X1.5 SAFETY MO (MISCELLANEOUS) ×1 IMPLANT
NEEDLE HYPO 22X1.5 SAFETY MO (MISCELLANEOUS) ×1 IMPLANT
NS IRRIG 1000ML POUR BTL (IV SOLUTION) ×1 IMPLANT
PACK BASIN MINOR ARMC (MISCELLANEOUS) ×1 IMPLANT
SOL PREP PVP 2OZ (MISCELLANEOUS) ×1
SOLUTION PREP PVP 2OZ (MISCELLANEOUS) ×1 IMPLANT
SPONGE T-LAP 18X18 ~~LOC~~+RFID (SPONGE) ×1 IMPLANT
TRAP FLUID SMOKE EVACUATOR (MISCELLANEOUS) ×1 IMPLANT
WATER STERILE IRR 500ML POUR (IV SOLUTION) ×1 IMPLANT

## 2023-03-10 NOTE — H&P (Signed)
History and Physical    Patient: Joel Flores WUX:[id number] DOB: [birth year] DOA: 03/09/2023 DOS: the patient was seen and examined on 03/10/2023 PCP: Duanne Limerick, MD  Patient coming from: Home  Chief Complaint:  Chief Complaint  Patient presents with   Hernia    HPI: Joel Flores is a 72 y.o. male with medical history significant for current tobacco abuse about 1-1/2 pack/day for the past 50 years, occasional alcohol,, sulfa allergy, essential hypertension, GERD, hypothyroidism, anxiety, bilateral carotid artery stenosis, colonic polyps, diabetes mellitus type 2 with a last A1c of 6.8 in August, noninvasive melanoma status post recent resection on his upper middle back, peripheral vascular disease on Plavix, history of lap cholecystectomy for perforated gallbladder in 2018 presents today after being seen at urgent care earlier today with complaints of right flank pain, swelling, that has been going on since August of this year, and now progressively getting worse.  No erythema or drainage discharge fluctuance.  No recent trauma, no recent procedures, no recent colonoscopy.  Urgent care note does state patient was pulling his lawnmower string and felt pain in that same area. Patient is a limited historian at bedside wife is giving some of the history patient states that he is not having any pain he just came because of the swelling.  Patient was sent to the emergency room for concerns of strangulated abdominal wall hernia. No chest pain fever shortness of breath nausea vomiting diarrhea or any other complaints.  Arrival vitals show heart rate of 111 is afebrile respirations are 18 O2 sats of 98% on room air white count of 14 hemoglobin of 12.8 platelets 484.  In emergency room vitals trend shows: Patient meets sepsis criteria. Vitals:   03/09/23 1643 03/09/23 1958 03/09/23 2030 03/09/23 2339  BP: (!) 129/97 (!) 148/83 138/84 129/74  Pulse: (!) 111 84 85 80  Temp: 98 F (36.7 C) 98.4 F  (36.9 C)  98 F (36.7 C)  Resp: 18 17  16   Height: 5\' 7"  (1.702 m)     Weight: 70.3 kg     SpO2: 98% 98% 96% 98%  TempSrc: Oral Oral  Oral  BMI (Calculated): 24.27     Labs are notable for : Stat CT of the abdomen done to evaluate patient's masslike area on the right flank shows:perihepatic multi septated abscess or less likely mass. Etiology likely due to walled-off dropped gallstones from cholecystectomy/history of perforated cholecystitis. Differential diagnosis does include a malignancy/metastasis. Finding is located along the retroperitoneum and perihepatic space with extension through the right lateral abdominal wall into the right abdominal musculature with largest component measuring up to (8 x 6 x 10 cm). Recommend surgical consultation. 2. Colonic diverticulosis with no acute diverticulitis. 3. Trace right pleural effusion. 4. Aortic Atherosclerosis (ICD10-I70.0).  Blood work shows mild hyponatremia of 134 glucose 141 normal kidney function normal LFTs. CBC shows leukocytosis of 14 hemoglobin mildly low at 12.8 platelet count of 484. As patient meets sepsis criteria with heart rate white count and source of infection we will obtain blood cultures and lactic acid. Case was discussed with on-call general surgery by ED provider who recommended patient to receive antibiotic coverage with Zosyn n.p.o. after midnight holding anticoagulation and antiplatelet therapy so that patient can undergo anticipated intervention, suspect patient will need a drain.  In the ED pt received: Medications  albuterol (VENTOLIN HFA) 108 (90 Base) MCG/ACT inhaler 1 puff (has no administration in time range)  amLODipine (NORVASC) tablet 5 mg (has no  administration in time range)  fluticasone (FLONASE) 50 MCG/ACT nasal spray 1 spray (has no administration in time range)  levothyroxine (SYNTHROID) tablet 112 mcg (has no administration in time range)  losartan (COZAAR) tablet 100 mg (has no administration in time  range)  rosuvastatin (CRESTOR) tablet 40 mg (has no administration in time range)  sertraline (ZOLOFT) tablet 100 mg (has no administration in time range)  sodium chloride flush (NS) 0.9 % injection 3 mL (has no administration in time range)  0.9 %  sodium chloride infusion (has no administration in time range)  acetaminophen (TYLENOL) tablet 650 mg (has no administration in time range)    Or  acetaminophen (TYLENOL) suppository 650 mg (has no administration in time range)  morphine (PF) 2 MG/ML injection 2 mg (has no administration in time range)  nicotine (NICODERM CQ - dosed in mg/24 hours) patch 21 mg (has no administration in time range)  iohexol (OMNIPAQUE) 300 MG/ML solution 100 mL (100 mLs Intravenous Contrast Given 03/09/23 1945)  acetaminophen (TYLENOL) tablet 650 mg (650 mg Oral Given 03/09/23 2258)  piperacillin-tazobactam (ZOSYN) IVPB 3.375 g (0 g Intravenous Stopped 03/10/23 0022)   Review of Systems  Genitourinary:  Positive for flank pain.  All other systems reviewed and are negative.  Past Medical History:  Diagnosis Date   Allergy 1990   Anxiety    PANIC ATTACKS   Arthritis    right shoulder   Atherosclerosis of aorta (HCC) 02/12/2015   Cataract    COPD (chronic obstructive pulmonary disease) (HCC)    Cough    Depression    DM (diabetes mellitus) (HCC) 03/10/2023   Environmental and seasonal allergies    Essential hypertension 02/12/2015   Family history of adverse reaction to anesthesia    Father - 34 - MI, then stroke after Prostate surgery   GERD (gastroesophageal reflux disease)    History of hiatal hernia    Hyperlipidemia    Hypertension    Hypothyroidism    Incisional hernia, without obstruction or gangrene    PVD (peripheral vascular disease) (HCC) 11/17/2016   Thyroid disease    Venous insufficiency    Vertigo    can happen daily   Wears hearing aid in both ears    has, does not wear   Past Surgical History:  Procedure Laterality Date    CATARACT EXTRACTION W/PHACO Right 03/16/2018   Procedure: CATARACT EXTRACTION PHACO AND INTRAOCULAR LENS PLACEMENT (IOC);  Surgeon: Elliot Cousin, MD;  Location: [facility];  Service: Ophthalmology;  Laterality: Right;  [place]  01:05 CDE 11.85 Fluid pack lot # [id number]   CATARACT EXTRACTION W/PHACO Left 05/08/2020   Procedure: CATARACT EXTRACTION PHACO AND INTRAOCULAR LENS PLACEMENT (IOC) LEFT 7.32 00:54.7 13.4%;  Surgeon: Lockie Mola, MD;  Location: [facility];  Service: Ophthalmology;  Laterality: Left;   CHOLECYSTECTOMY     COLONOSCOPY  2015   Dr Servando Snare- cleared for 5 years    COLONOSCOPY WITH PROPOFOL N/A 01/17/2018   Procedure: COLONOSCOPY WITH PROPOFOL;  Surgeon: Midge Minium, MD;  Location: [facility];  Service: Endoscopy;  Laterality: N/A;  requests early   HERNIA REPAIR  July 2019   INSERTION OF MESH N/A 08/31/2017   Procedure: INSERTION OF MESH;  Surgeon: Ancil Linsey, MD;  Location: [facility];  Service: General;  Laterality: N/A;   POLYPECTOMY  01/17/2018   Procedure: POLYPECTOMY;  Surgeon: Midge Minium, MD;  Location: [facility];  Service: Endoscopy;;   UMBILICAL HERNIA REPAIR N/A 08/31/2017   Procedure:  HERNIA REPAIR UMBILICAL ADULT;  Surgeon: Ancil Linsey, MD;  Location: [facility];  Service: General;  Laterality: N/A;   VEIN SURGERY     2 stents in R) leg    reports that he has been smoking cigarettes. He has a 75 pack-year smoking history. He has never used smokeless tobacco. He reports current alcohol use. He reports that he does not use drugs.  Allergies  Allergen Reactions   Sulfa Antibiotics Rash    Family History  Problem Relation Age of Onset   Diabetes Mother    Cancer Father    Heart disease Father    Stroke Father     Prior to Admission medications   Medication Sig Start Date End Date Taking? Authorizing Provider  acetaminophen (TYLENOL) 325 MG tablet Take 325 mg by mouth daily as needed for moderate pain or headache.     [provider]  albuterol (VENTOLIN HFA) 108 (90 Base) MCG/ACT inhaler Inhale into the lungs. Inhale 2 inhalations into the lungs 4 (four) times daily PRN 05/26/21   [provider]  amLODipine (NORVASC) 5 MG tablet Take 5 mg by mouth daily.    [provider]  azelastine (ASTELIN) 0.1 % nasal spray Place 2 sprays into both nostrils 2 (two) times daily. Use in each nostril as directed 11/03/21   Duanne Limerick, MD  cetirizine (ZYRTEC) 10 MG tablet Take 1 tablet (10 mg total) by mouth daily. 11/27/22   Duanne Limerick, MD  clopidogrel (PLAVIX) 75 MG tablet Take 1 tablet (75 mg total) by mouth daily. 11/27/22   Duanne Limerick, MD  fluticasone (FLONASE) 50 MCG/ACT nasal spray SPRAY 2 SPRAYS INTO EACH NOSTRIL EVERY DAY 11/27/22   Duanne Limerick, MD  ibuprofen (ADVIL,MOTRIN) 200 MG tablet Take 400 mg by mouth every 8 (eight) hours as needed (for pain).    [provider]  levothyroxine (SYNTHROID) 112 MCG tablet TAKE 1 TABLET BY MOUTH EVERY DAY 01/07/23   Duanne Limerick, MD  losartan (COZAAR) 100 MG tablet Take 100 mg by mouth daily.    [provider]  metFORMIN (GLUCOPHAGE) 500 MG tablet TAKE 1 TABLET BY MOUTH EVERY DAY WITH BREAKFAST 11/27/22   Duanne Limerick, MD  pantoprazole (PROTONIX) 40 MG tablet Take 1 tablet (40 mg total) by mouth daily. 11/27/22   Duanne Limerick, MD  rosuvastatin (CRESTOR) 40 MG tablet Take 40 mg by mouth daily.    [provider]  sertraline (ZOLOFT) 100 MG tablet Take 1 tablet (100 mg total) by mouth daily. 11/27/22   Duanne Limerick, MD     Vitals:   03/09/23 1643 03/09/23 1958 03/09/23 2030 03/09/23 2339  BP: (!) 129/97 (!) 148/83 138/84 129/74  Pulse: (!) 111 84 85 80  Resp: 18 17  16   Temp: 98 F (36.7 C) 98.4 F (36.9 C)  98 F (36.7 C)  TempSrc: Oral Oral  Oral  SpO2: 98% 98% 96% 98%  Weight: 70.3 kg     Height: 5\' 7"  (1.702 m)      Physical Exam Vitals and nursing note reviewed.  Constitutional:       General: He is not in acute distress. HENT:     Head: Normocephalic and atraumatic.     Right Ear: Hearing normal.     Left Ear: Hearing normal.     Nose: Nose normal. No nasal deformity.     Mouth/Throat:     Lips: Pink.     Tongue:  No lesions.     Pharynx: Oropharynx is clear.  Eyes:     General: Lids are normal.     Extraocular Movements: Extraocular movements intact.  Cardiovascular:     Rate and Rhythm: Normal rate and regular rhythm.     Heart sounds: Normal heart sounds.  Pulmonary:     Effort: Pulmonary effort is normal.     Breath sounds: Normal breath sounds.  Abdominal:     General: Bowel sounds are normal. There is no distension. There are no signs of injury.     Palpations: There is mass.     Tenderness: There is no abdominal tenderness. There is no guarding.    Musculoskeletal:     Right lower leg: No edema.     Left lower leg: No edema.  Skin:    General: Skin is warm.  Neurological:     General: No focal deficit present.     Mental Status: He is alert and oriented to person, place, and time.     Cranial Nerves: Cranial nerves 2-12 are intact.  Psychiatric:        Attention and Perception: Attention normal.        Mood and Affect: Mood normal.        Speech: Speech normal.        Behavior: Behavior normal. Behavior is cooperative.      Labs on Admission: I have personally reviewed following labs and imaging studies Results for orders placed or performed during the hospital encounter of 03/09/23 (from the past 24 hour(s))  CBC with Differential     Status: Abnormal   Collection Time: 03/09/23  4:45 PM  Result Value Ref Range   WBC 14.0 (H) 4.0 - 10.5 K/uL   RBC 4.60 4.22 - 5.81 MIL/uL   Hemoglobin 12.8 (L) 13.0 - 17.0 g/dL   HCT 13.0 86.5 - 78.4 %   MCV 85.7 80.0 - 100.0 fL   MCH 27.8 26.0 - 34.0 pg   MCHC 32.5 30.0 - 36.0 g/dL   RDW 69.6 29.5 - 28.4 %   Platelets 484 (H) 150 - 400 K/uL   nRBC 0.0 0.0 - 0.2 %   Neutrophils Relative % 76 %    Neutro Abs 10.7 (H) 1.7 - 7.7 K/uL   Lymphocytes Relative 11 %   Lymphs Abs 1.6 0.7 - 4.0 K/uL   Monocytes Relative 9 %   Monocytes Absolute 1.3 (H) 0.1 - 1.0 K/uL   Eosinophils Relative 2 %   Eosinophils Absolute 0.3 0.0 - 0.5 K/uL   Basophils Relative 1 %   Basophils Absolute 0.1 0.0 - 0.1 K/uL   Immature Granulocytes 1 %   Abs Immature Granulocytes 0.08 (H) 0.00 - 0.07 K/uL  Basic metabolic panel     Status: Abnormal   Collection Time: 03/09/23  4:45 PM  Result Value Ref Range   Sodium 134 (L) 135 - 145 mmol/L   Potassium 3.5 3.5 - 5.1 mmol/L   Chloride 102 98 - 111 mmol/L   CO2 23 22 - 32 mmol/L   Glucose, Bld 141 (H) 70 - 99 mg/dL   BUN 13 8 - 23 mg/dL   Creatinine, Ser 1.32 0.61 - 1.24 mg/dL   Calcium 8.8 (L) 8.9 - 10.3 mg/dL   GFR, Estimated >44 >01 mL/min   Anion gap 9 5 - 15  Hepatic function panel     Status: None   Collection Time: 03/09/23  4:45 PM  Result Value Ref Range  Total Protein 7.6 6.5 - 8.1 g/dL   Albumin 3.6 3.5 - 5.0 g/dL   AST 18 15 - 41 U/L   ALT 10 0 - 44 U/L   Alkaline Phosphatase 86 38 - 126 U/L   Total Bilirubin 0.3 <1.2 mg/dL   Bilirubin, Direct <1.6 0.0 - 0.2 mg/dL   Indirect Bilirubin NOT CALCULATED 0.3 - 0.9 mg/dL    CBC: Recent Labs  Lab 03/09/23 1645  WBC 14.0*  NEUTROABS 10.7*  HGB 12.8*  HCT 39.4  MCV 85.7  PLT 484*   Basic Metabolic Panel: Recent Labs  Lab 03/09/23 1645  NA 134*  K 3.5  CL 102  CO2 23  GLUCOSE 141*  BUN 13  CREATININE 0.67  CALCIUM 8.8*   GFR: Estimated Creatinine Clearance: 78 mL/min (by C-G formula based on SCr of 0.67 mg/dL). Liver Function Tests: Recent Labs  Lab 03/09/23 1645  AST 18  ALT 10  ALKPHOS 86  BILITOT 0.3  PROT 7.6  ALBUMIN 3.6   No results for input(s): "LIPASE", "AMYLASE" in the last 168 hours. No results for input(s): "AMMONIA" in the last 168 hours. Coagulation Profile: No results for input(s): "INR", "PROTIME" in the last 168 hours. Cardiac Enzymes: No results  for input(s): "CKTOTAL", "CKMB", "CKMBINDEX", "TROPONINI" in the last 168 hours. BNP (last 3 results) No results for input(s): "PROBNP" in the last 8760 hours. HbA1C: No results for input(s): "HGBA1C" in the last 72 hours. CBG: No results for input(s): "GLUCAP" in the last 168 hours. Lipid Profile: No results for input(s): "CHOL", "HDL", "LDLCALC", "TRIG", "CHOLHDL", "LDLDIRECT" in the last 72 hours. Thyroid Function Tests: No results for input(s): "TSH", "T4TOTAL", "FREET4", "T3FREE", "THYROIDAB" in the last 72 hours. Anemia Panel: No results for input(s): "VITAMINB12", "FOLATE", "FERRITIN", "TIBC", "IRON", "RETICCTPCT" in the last 72 hours. Urinalysis    Component Value Date/Time   BILIRUBINUR negative 11/13/2020 1026   PROTEINUR Negative 11/13/2020 1026   UROBILINOGEN 0.2 11/13/2020 1026   NITRITE negative 11/13/2020 1026   LEUKOCYTESUR Negative 11/13/2020 1026   Unresulted Labs (From admission, onward)     Start     Ordered   03/10/23 0500  Comprehensive metabolic panel  Tomorrow morning,   R        03/10/23 0105   03/10/23 0500  CBC  Tomorrow morning,   R        03/10/23 0105   03/10/23 0108  Urine Culture (for pregnant, neutropenic or urologic patients or patients with an indwelling urinary catheter)  (Urine Labs)  Add-on,   AD       Question:  Indication  Answer:  Sepsis   03/10/23 0107   03/10/23 0101  Hemoglobin A1c  Add-on,   AD        03/10/23 0102   03/10/23 0058  MRSA culture  Once,   URGENT        03/10/23 0057   03/10/23 0037  Lactic acid, plasma  (Lactic Acid)  STAT Now then every 3 hours,   STAT      03/10/23 0036   03/10/23 0036  Culture, blood (Routine X 2) w Reflex to ID Panel  BLOOD CULTURE X 2,   STAT      03/10/23 0036           Medications  albuterol (VENTOLIN HFA) 108 (90 Base) MCG/ACT inhaler 1 puff (has no administration in time range)  amLODipine (NORVASC) tablet 5 mg (has no administration in time range)  fluticasone (FLONASE) 50 MCG/ACT  nasal spray 1 spray (has no administration in time range)  levothyroxine (SYNTHROID) tablet 112 mcg (has no administration in time range)  losartan (COZAAR) tablet 100 mg (has no administration in time range)  rosuvastatin (CRESTOR) tablet 40 mg (has no administration in time range)  sertraline (ZOLOFT) tablet 100 mg (has no administration in time range)  sodium chloride flush (NS) 0.9 % injection 3 mL (has no administration in time range)  0.9 %  sodium chloride infusion (has no administration in time range)  acetaminophen (TYLENOL) tablet 650 mg (has no administration in time range)    Or  acetaminophen (TYLENOL) suppository 650 mg (has no administration in time range)  morphine (PF) 2 MG/ML injection 2 mg (has no administration in time range)  nicotine (NICODERM CQ - dosed in mg/24 hours) patch 21 mg (has no administration in time range)  iohexol (OMNIPAQUE) 300 MG/ML solution 100 mL (100 mLs Intravenous Contrast Given 03/09/23 1945)  acetaminophen (TYLENOL) tablet 650 mg (650 mg Oral Given 03/09/23 2258)  piperacillin-tazobactam (ZOSYN) IVPB 3.375 g (0 g Intravenous Stopped 03/10/23 0022)   Radiological Exams on Admission: CT ABDOMEN PELVIS W CONTRAST  Result Date: 03/09/2023 CLINICAL DATA:  Hernia suspected, abdominal wall Started in August and growing in size. History of melanoma. History of perforated cholecystitis. EXAM: CT ABDOMEN AND PELVIS WITH CONTRAST TECHNIQUE: Multidetector CT imaging of the abdomen and pelvis was performed using the standard protocol following bolus administration of intravenous contrast. RADIATION DOSE REDUCTION: This exam was performed according to the departmental dose-optimization program which includes automated exposure control, adjustment of the mA and/or kV according to patient size and/or use of iterative reconstruction technique. CONTRAST:  OMNIPAQUE IOHEXOL 300 MG/ML  SOLN COMPARISON:  CT chest 09/25/2022, ultrasound abdomen 08/17/2016, ultrasound  abdomen 12/23/2017 FINDINGS: Lower chest: Trace right pleural effusion. Hepatobiliary: No focal liver abnormality. Status post cholecystectomy. No biliary dilatation. Pancreas: No focal lesion. Normal pancreatic contour. No surrounding inflammatory changes. No main pancreatic ductal dilatation. Spleen: Normal in size without focal abnormality. Adrenals/Urinary Tract: No adrenal nodule bilaterally. Bilateral kidneys enhance symmetrically. No hydronephrosis. No hydroureter. The urinary bladder is unremarkable. On delayed imaging, there is no urothelial wall thickening and there are no filling defects in the opacified portions of the bilateral collecting systems or ureters. Stomach/Bowel: Stomach is within normal limits. No evidence of bowel wall thickening or dilatation. Colonic diverticulosis. Unremarkable appendix. Vascular/Lymphatic: No abdominal aorta or iliac aneurysm. Severe atherosclerotic plaque of the aorta and its branches. No abdominal, pelvic, or inguinal lymphadenopathy. Reproductive: Prostate is unremarkable. Other: No intraperitoneal free fluid. No intraperitoneal free gas. Interval increase in size of a perihepatic, now fully visualized (previously only partially visualized on CT chest 2021, 2022, 2023), large lobulated fluid density lesion within thick walls and multiple septation as well as several layering calcified stones (2:37). Finding is located along the retroperitoneum and perihepatic space with extension through the right lateral abdominal wall into the right abdominal musculature with largest component measuring up to (8 x 2 x 10 cm). Perihepatic component measures up to at least 13 cm in the craniocaudal dimension and approximately up to 8 x 3 cm on axial imaging. Musculoskeletal: No abdominal wall hernia or abnormality. No suspicious lytic or blastic osseous lesions. No acute displaced fracture. Multilevel degenerative changes of the spine. Mild retrolisthesis of L5 on S1 IMPRESSION: 1.  Interval increase in size of a perihepatic multi septated abscess or less likely mass. Etiology likely due to walled-off dropped gallstones from cholecystectomy/history of perforated cholecystitis.  Differential diagnosis does include a malignancy/metastasis. Finding is located along the retroperitoneum and perihepatic space with extension through the right lateral abdominal wall into the right abdominal musculature with largest component measuring up to (8 x 6 x 10 cm). Recommend surgical consultation. 2. Colonic diverticulosis with no acute diverticulitis. 3. Trace right pleural effusion. 4.  Aortic Atherosclerosis (ICD10-I70.0). Electronically Signed   By: Tish Frederickson M.D.   On: 03/09/2023 23:12     Data Reviewed: Relevant notes from primary care and specialist visits, past discharge summaries as available in EHR, including Care Everywhere. Prior diagnostic testing as pertinent to current admission diagnoses Updated medications and problem lists for reconciliation ED course, including vitals, labs, imaging, treatment and response to treatment Triage notes, nursing and pharmacy notes and ED provider's notes Notable results as noted in HPI  Assessment and Plan: * Abdominal pain Secondary to patient's progressive mass and acute worsening with possible strain from him using his lawnmower. As needed Tylenol  Hepatic abscess Patient found to have extensive perihepatic complicated abscess with detailed findings on the CT scan. Started on Zosyn per general surgery recommendation.  General surgery consult requested.    Latest Ref Rng & Units 03/09/2023    4:45 PM 11/27/2022   11:03 AM 05/06/2022    9:18 AM  CMP  Glucose 70 - 99 mg/dL 409  96  811   BUN 8 - 23 mg/dL 13  14  23    Creatinine 0.61 - 1.24 mg/dL 9.14  7.82  9.56   Sodium 135 - 145 mmol/L 134  136  138   Potassium 3.5 - 5.1 mmol/L 3.5  4.1  4.4   Chloride 98 - 111 mmol/L 102  105  101   CO2 22 - 32 mmol/L 23  22  22    Calcium 8.9  - 10.3 mg/dL 8.8  9.0  21.3   Total Protein 6.5 - 8.1 g/dL 7.6   7.4   Total Bilirubin <1.2 mg/dL 0.3   0.4   Alkaline Phos 38 - 126 U/L 86   100   AST 15 - 41 U/L 18   39   ALT 0 - 44 U/L 10   29       Sepsis (HCC) Patient meet sepsis criteria with heart rate white count and infection source being his hepatic abscess. Most likely etiology in this case is gram-negative bacteremia from E. coli or gram-negative rods. Blood cultures ordered will also obtain a urine culture.  I will continue patient on Zosyn and additional supportive measures as deemed appropriate or necessary.  DM (diabetes mellitus) (HCC) Patient states that he does not have diabetes and he takes metformin he is prediabetic. Will defer to a.m. team for counseling on diabetes once patient is postprocedure.  Latest Reference Range & Units 11/03/21 08:59 11/27/21 10:37 05/06/22 09:18 09/04/22 09:42 11/27/22 11:03  Hemoglobin A1C 4.8 - 5.6 % 7.1 (H) 7.0 (H) 6.7 (H) 6.7 (H) 6.8 (H)  Glycemic protocol.  Tobacco use disorder Nicotine patch.  Patient counseled briefly on tobacco cessation as he was eating.  PVD (peripheral vascular disease) (HCC) Antiplatelet therapy held.    Chronic anxiety Continue Zoloft at 100 mg.     Adult hypothyroidism Cont levothyroxine at 112 mcg.    Essential hypertension Vitals:   03/09/23 1643 03/09/23 1958 03/09/23 2030 03/09/23 2339  BP: (!) 129/97 (!) 148/83 138/84 129/74  Amlodipine / cozaar continued.     DVT prophylaxis:  SCD's  Consults:  General surgery: Dr. Everlene Farrier.  Advance Care Planning:    Code Status: Full Code   Family Communication:  Wife at bedside   Disposition Plan:  Home   Severity of Illness: The appropriate patient status for this patient is INPATIENT. Inpatient status is judged to be reasonable and necessary in order to provide the required intensity of service to ensure the patient's safety. The patient's presenting symptoms, physical exam findings,  and initial radiographic and laboratory data in the context of their chronic comorbidities is felt to place them at high risk for further clinical deterioration. Furthermore, it is not anticipated that the patient will be medically stable for discharge from the hospital within 2 midnights of admission.   * I certify that at the point of admission it is my clinical judgment that the patient will require inpatient hospital care spanning beyond 2 midnights from the point of admission due to high intensity of service, high risk for further deterioration and high frequency of surveillance required.*  Author: Gertha Calkin, MD 03/10/2023 1:09 AM  For on call review [email or web address].   Orders Placed This Encounter  Procedures   Culture, blood (Routine X 2) w Reflex to ID Panel    Standing Status:   Standing    Number of Occurrences:   2   MRSA culture    Standing Status:   Standing    Number of Occurrences:   1   Urine Culture (for pregnant, neutropenic or urologic patients or patients with an indwelling urinary catheter)    Standing Status:   Standing    Number of Occurrences:   1    Order Specific Question:   Indication    Answer:   Sepsis   CT ABDOMEN PELVIS W CONTRAST    Standing Status:   Standing    Number of Occurrences:   1    Order Specific Question:   Does the patient have a contrast media/X-ray dye allergy?    Answer:   No    Order Specific Question:   If indicated for the ordered procedure, I authorize the administration of contrast media per Radiology protocol    Answer:   Yes    Order Specific Question:   If indicated for the ordered procedure, I authorize the administration of oral contrast media per Radiology protocol    Answer:   Yes   CBC with Differential    Standing Status:   Standing    Number of Occurrences:   1   Basic metabolic panel    Standing Status:   Standing    Number of Occurrences:   1   Hepatic function panel    Standing Status:   Standing    Number of  Occurrences:   1   Lactic acid, plasma    Standing Status:   Standing    Number of Occurrences:   2   Hemoglobin A1c    Standing Status:   Standing    Number of Occurrences:   1   Comprehensive metabolic panel    Standing Status:   Standing    Number of Occurrences:   1   CBC    Standing Status:   Standing    Number of Occurrences:   1   Diet NPO time specified    Standing Status:   Standing    Number of Occurrences:   1   Place and maintain sequential compression device    Standing Status:   Standing    Number of Occurrences:   1  Maintain IV access    Standing Status:   Standing    Number of Occurrences:   1   Vital signs    Standing Status:   Standing    Number of Occurrences:   1   Notify physician (specify)    Standing Status:   Standing    Number of Occurrences:   20    Order Specific Question:   Notify Physician    Answer:   for pulse less than 55 or greater than 120    Order Specific Question:   Notify Physician    Answer:   for respiratory rate less than 12 or greater than 25    Order Specific Question:   Notify Physician    Answer:   for temperature greater than 100.5 F    Order Specific Question:   Notify Physician    Answer:   for urinary output less than 30 mL/hr for four hours    Order Specific Question:   Notify Physician    Answer:   for systolic BP less than 90 or greater than 160, diastolic BP less than 60 or greater than 100    Order Specific Question:   Notify Physician    Answer:   for new hypoxia w/ oxygen saturations < 88%   Mobility Protocol: No Restrictions    RN to initiate protocols based on patient's level of care    Standing Status:   Standing    Number of Occurrences:   1   Refer to Sidebar Report Refer to ICU, Med-Surg, Progressive, and Step-Down Mobility Protocol Sidebars    Refer to ICU, Med-Surg, Progressive, and Step-Down Mobility Protocol Sidebars    Standing Status:   Standing    Number of Occurrences:   1   Daily weights     Standing Status:   Standing    Number of Occurrences:   1   Intake and Output    Standing Status:   Standing    Number of Occurrences:   1   Do not place and if present remove PureWick    Standing Status:   Standing    Number of Occurrences:   1   Initiate Oral Care Protocol    Standing Status:   Standing    Number of Occurrences:   1   Initiate Carrier Fluid Protocol    Standing Status:   Standing    Number of Occurrences:   1   RN may order General Admission PRN Orders utilizing "General Admission PRN medications" (through manage orders) for the following patient needs: allergy symptoms (Claritin), cold sores (Carmex), cough (Robitussin DM), eye irritation (Liquifilm Tears), hemorrhoids (Tucks), indigestion (Maalox), minor skin irritation (Hydrocortisone Cream), muscle pain (Ben Gay), nose irritation (saline nasal spray) and sore throat (Chloraseptic spray).    Standing Status:   Standing    Number of Occurrences:   [id number]   Cardiac Monitoring Continuous x 48 hours Indications for use: Other; Other indications for use: sepsis    Standing Status:   Standing    Number of Occurrences:   1    Order Specific Question:   Indications for use:    Answer:   Other    Order Specific Question:   Other indications for use:    Answer:   sepsis   Full code    Standing Status:   Standing    Number of Occurrences:   1    Order Specific Question:   By:    Answer:  Other   Consult to hospitalist    Standing Status:   Standing    Number of Occurrences:   1    Order Specific Question:   Place call to:    Answer:   [phone]    Order Specific Question:   Reason for Consult    Answer:   Admit   Consult to general surgery Consult Timeframe: ROUTINE - requires response within 24 hours; Reason for Consult? hepatic abscess    Standing Status:   Standing    Number of Occurrences:   1    Order Specific Question:   Consult Timeframe    Answer:   ROUTINE - requires response within 24 hours    Order  Specific Question:   Reason for Consult?    Answer:   hepatic abscess   Pulse oximetry check with vital signs    Standing Status:   Standing    Number of Occurrences:   1   Oxygen therapy Mode or (Route): Nasal cannula; Liters Per Minute: 2; Keep 02 saturation: greater than 92 %    Standing Status:   Standing    Number of Occurrences:   20    Order Specific Question:   Mode or (Route)    Answer:   Nasal cannula    Order Specific Question:   Liters Per Minute    Answer:   2    Order Specific Question:   Keep 02 saturation    Answer:   greater than 92 %   Admit to Inpatient (patient's expected length of stay will be greater than 2 midnights or inpatient only procedure)    Standing Status:   Standing    Number of Occurrences:   1    Order Specific Question:   Hospital Area    Answer:   [facility] [[id number]]    Order Specific Question:   Level of Care    Answer:   Progressive [102]    Order Specific Question:   Admit to Progressive based on following criteria    Answer:   MULTISYSTEM THREATS such as stable sepsis, metabolic/electrolyte imbalance with or without encephalopathy that is responding to early treatment.    Order Specific Question:   Covid Evaluation    Answer:   Asymptomatic - no recent exposure (last 10 days) testing not required    Order Specific Question:   Diagnosis    Answer:   Abdominal pain [[id number]]    Order Specific Question:   Admitting Physician    Answer:   Darrold Junker    Order Specific Question:   Attending Physician    Answer:   Darrold Junker    Order Specific Question:   Certification:    Answer:   I certify this patient will need inpatient services for at least 2 midnights    Order Specific Question:   Expected Medical Readiness    Answer:   03/13/2023   Admit to Inpatient (patient's expected length of stay will be greater than 2 midnights or inpatient only procedure)    Standing Status:   Standing    Number of  Occurrences:   1    Order Specific Question:   Hospital Area    Answer:   [facility] [[id number]]    Order Specific Question:   Level of Care    Answer:   Telemetry Medical [104]    Order Specific Question:   Covid Evaluation    Answer:  Asymptomatic - no recent exposure (last 10 days) testing not required    Order Specific Question:   Diagnosis    Answer:   Abdominal pain [[id number]]    Order Specific Question:   Admitting Physician    Answer:   Darrold Junker    Order Specific Question:   Attending Physician    Answer:   Darrold Junker    Order Specific Question:   Certification:    Answer:   I certify this patient will need inpatient services for at least 2 midnights    Order Specific Question:   Expected Medical Readiness    Answer:   03/13/2023   Aspiration precautions    Standing Status:   Standing    Number of Occurrences:   1   Fall precautions    Standing Status:   Standing    Number of Occurrences:   1

## 2023-03-10 NOTE — Assessment & Plan Note (Signed)
Cont levothyroxine at 112 mcg.

## 2023-03-10 NOTE — Anesthesia Postprocedure Evaluation (Signed)
Anesthesia Post Note  Patient: Joel Flores  Procedure(s) Performed: IRRIGATION AND DEBRIDEMENT ABDOMINAL WALL ABSCESS  Patient location during evaluation: PACU Anesthesia Type: General Level of consciousness: awake and alert Pain management: pain level controlled Vital Signs Assessment: post-procedure vital signs reviewed and stable Respiratory status: spontaneous breathing, nonlabored ventilation, respiratory function stable and patient connected to nasal cannula oxygen Cardiovascular status: blood pressure returned to baseline and stable Postop Assessment: no apparent nausea or vomiting Anesthetic complications: no  No notable events documented.   Last Vitals:  Vitals:   03/10/23 0754 03/10/23 1005  BP: (!) 140/82 137/85  Pulse: 78 78  Resp: 16 16  Temp: 36.6 C (!) 36.2 C  SpO2: 94% 96%    Last Pain:  Vitals:   03/10/23 1005  TempSrc: Temporal  PainSc: 3                  Stephanie Coup

## 2023-03-10 NOTE — Progress Notes (Signed)
Courtesy visit.   Patient is seen and examined today morning after the surgical procedure. He had incision and drainage of complex  abdominal wall abscess to intraperitoneal extension. He tolerated the procedure well. Has some pain, asks if he is getting antibiotics. Wife at bedside. No nausea, abdominal pain. Pharmacy called to adjust antibiotic dose. He is able to tolerate diet. Further management pending fluid cultures and clinical course.

## 2023-03-10 NOTE — Progress Notes (Signed)
Pt arrived in Pre op with Telemetry Box on

## 2023-03-10 NOTE — Assessment & Plan Note (Addendum)
Patient found to have extensive perihepatic complicated abscess with detailed findings on the CT scan. Started on Zosyn per general surgery recommendation.  General surgery consult requested.    Latest Ref Rng & Units 03/09/2023    4:45 PM 11/27/2022   11:03 AM 05/06/2022    9:18 AM  CMP  Glucose 70 - 99 mg/dL 884  96  166   BUN 8 - 23 mg/dL 13  14  23    Creatinine 0.61 - 1.24 mg/dL 0.63  0.16  0.10   Sodium 135 - 145 mmol/L 134  136  138   Potassium 3.5 - 5.1 mmol/L 3.5  4.1  4.4   Chloride 98 - 111 mmol/L 102  105  101   CO2 22 - 32 mmol/L 23  22  22    Calcium 8.9 - 10.3 mg/dL 8.8  9.0  93.2   Total Protein 6.5 - 8.1 g/dL 7.6   7.4   Total Bilirubin <1.2 mg/dL 0.3   0.4   Alkaline Phos 38 - 126 U/L 86   100   AST 15 - 41 U/L 18   39   ALT 0 - 44 U/L 10   29

## 2023-03-10 NOTE — Consult Note (Addendum)
[facility] SURGICAL CONSULTATION NOTE (initial) - cpt: 16109   HISTORY OF PRESENT ILLNESS (HPI):  72 y.o. male presented to [facility] ED yesterday secondary to abdominal wall swelling. He first noticed an area of swelling to his RUQ in August. This unfortunately has continued to progressively worsen over the course of the last 3 months. He reports it is starting to get sore. No fever, chills, nausea, emesis. He denied any injuries to this area prior. He does have a history of perforated cholecystitis in 2018 and subsequent laparoscopic cholecystectomy. Also with history of umbilical hernia repair as well. Work up in the ED revealed a leukocytosis to 14.0K (now 11.2K), Hgb to 12.8, renal function normal with sCr - 0.67, mild lactic acidosis to 2.1 (now 1.0). CT Abdomen/Pelvis was obtained and concerning for RUQ abdominal wall collection with intra-peritoneal collection. He was admitted to the medicine service. Currently on Zosyn   Surgery is consulted by emergency medicine provide [facility], PA-C in this context for evaluation and management of RUQ abdominal wall abscess.  PAST MEDICAL HISTORY (PMH):  Past Medical History:  Diagnosis Date   Allergy 1990   Anxiety    PANIC ATTACKS   Arthritis    right shoulder   Atherosclerosis of aorta (HCC) 02/12/2015   Cataract    COPD (chronic obstructive pulmonary disease) (HCC)    Cough    Depression    DM (diabetes mellitus) (HCC) 03/10/2023   Environmental and seasonal allergies    Essential hypertension 02/12/2015   Family history of adverse reaction to anesthesia    Father - 31 - MI, then stroke after Prostate surgery   GERD (gastroesophageal reflux disease)    History of hiatal hernia    Hyperlipidemia    Hypertension    Hypothyroidism    Incisional hernia, without obstruction or gangrene    PVD (peripheral vascular disease) (HCC) 11/17/2016   Thyroid disease    Venous insufficiency    Vertigo    can happen daily    Wears hearing aid in both ears    has, does not wear     PAST SURGICAL HISTORY (PSH):  Past Surgical History:  Procedure Laterality Date   CATARACT EXTRACTION W/PHACO Right 03/16/2018   Procedure: CATARACT EXTRACTION PHACO AND INTRAOCULAR LENS PLACEMENT (IOC);  Surgeon: Elliot Cousin, MD;  Location: [facility];  Service: Ophthalmology;  Laterality: Right;  [place]  01:05 CDE 11.85 Fluid pack lot # [id number]   CATARACT EXTRACTION W/PHACO Left 05/08/2020   Procedure: CATARACT EXTRACTION PHACO AND INTRAOCULAR LENS PLACEMENT (IOC) LEFT 7.32 00:54.7 13.4%;  Surgeon: Lockie Mola, MD;  Location: [facility];  Service: Ophthalmology;  Laterality: Left;   CHOLECYSTECTOMY     COLONOSCOPY  2015   Dr Servando Snare- cleared for 5 years    COLONOSCOPY WITH PROPOFOL N/A 01/17/2018   Procedure: COLONOSCOPY WITH PROPOFOL;  Surgeon: Midge Minium, MD;  Location: [facility];  Service: Endoscopy;  Laterality: N/A;  requests early   HERNIA REPAIR  July 2019   INSERTION OF MESH N/A 08/31/2017   Procedure: INSERTION OF MESH;  Surgeon: Ancil Linsey, MD;  Location: [facility];  Service: General;  Laterality: N/A;   POLYPECTOMY  01/17/2018   Procedure: POLYPECTOMY;  Surgeon: Midge Minium, MD;  Location: [facility];  Service: Endoscopy;;   UMBILICAL HERNIA REPAIR N/A 08/31/2017   Procedure: HERNIA REPAIR UMBILICAL ADULT;  Surgeon: Ancil Linsey, MD;  Location: [facility];  Service: General;  Laterality: N/A;   VEIN SURGERY  2 stents in R) leg     MEDICATIONS:  Prior to Admission medications   Medication Sig Start Date End Date Taking? Authorizing Provider  acetaminophen (TYLENOL) 325 MG tablet Take 325 mg by mouth daily as needed for moderate pain or headache.    [provider]  albuterol (VENTOLIN HFA) 108 (90 Base) MCG/ACT inhaler Inhale into the lungs. Inhale 2 inhalations into the lungs 4 (four) times daily PRN 05/26/21   [provider]  amLODipine (NORVASC) 5  MG tablet Take 5 mg by mouth daily.    [provider]  azelastine (ASTELIN) 0.1 % nasal spray Place 2 sprays into both nostrils 2 (two) times daily. Use in each nostril as directed 11/03/21   Duanne Limerick, MD  cetirizine (ZYRTEC) 10 MG tablet Take 1 tablet (10 mg total) by mouth daily. 11/27/22   Duanne Limerick, MD  clopidogrel (PLAVIX) 75 MG tablet Take 1 tablet (75 mg total) by mouth daily. 11/27/22   Duanne Limerick, MD  fluticasone (FLONASE) 50 MCG/ACT nasal spray SPRAY 2 SPRAYS INTO EACH NOSTRIL EVERY DAY 11/27/22   Duanne Limerick, MD  ibuprofen (ADVIL,MOTRIN) 200 MG tablet Take 400 mg by mouth every 8 (eight) hours as needed (for pain).    [provider]  levothyroxine (SYNTHROID) 112 MCG tablet TAKE 1 TABLET BY MOUTH EVERY DAY 01/07/23   Duanne Limerick, MD  losartan (COZAAR) 100 MG tablet Take 100 mg by mouth daily.    [provider]  metFORMIN (GLUCOPHAGE) 500 MG tablet TAKE 1 TABLET BY MOUTH EVERY DAY WITH BREAKFAST 11/27/22   Duanne Limerick, MD  pantoprazole (PROTONIX) 40 MG tablet Take 1 tablet (40 mg total) by mouth daily. 11/27/22   Duanne Limerick, MD  rosuvastatin (CRESTOR) 40 MG tablet Take 40 mg by mouth daily.    [provider]  sertraline (ZOLOFT) 100 MG tablet Take 1 tablet (100 mg total) by mouth daily. 11/27/22   Duanne Limerick, MD     ALLERGIES:  Allergies  Allergen Reactions   Sulfa Antibiotics Rash     SOCIAL HISTORY:  Social History   Socioeconomic History   Marital status: Married    Spouse name: Not on file   Number of children: 3   Years of education: Not on file   Highest education level: 12th grade  Occupational History   Occupation: retired  Tobacco Use   Smoking status: Every Day    Current packs/day: 1.50    Average packs/day: 1.5 packs/day for 50.0 years (75.0 ttl pk-yrs)    Types: Cigarettes   Smokeless tobacco: Never  Vaping Use   Vaping status: Never Used  Substance and Sexual Activity   Alcohol use: Yes     Comment: once a month   Drug use: No   Sexual activity: Not Currently  Other Topics Concern   Not on file  Social History Narrative   Not on file   Social Determinants of Health   Financial Resource Strain: Low Risk  (09/04/2022)   Overall Financial Resource Strain (CARDIA)    Difficulty of Paying Living Expenses: Not hard at all  Food Insecurity: No Food Insecurity (03/10/2023)   Hunger Vital Sign    Worried About Running Out of Food in the Last Year: Never true    Ran Out of Food in the Last Year: Never true  Transportation Needs: No Transportation Needs (03/10/2023)   PRAPARE - Administrator, Civil Service (Medical): No  Lack of Transportation (Non-Medical): No  Physical Activity: Inactive (08/25/2021)   Exercise Vital Sign    Days of Exercise per Week: 0 days    Minutes of Exercise per Session: 0 min  Stress: No Stress Concern Present (09/04/2022)   [employer] of Occupational Health - Occupational Stress Questionnaire    Feeling of Stress : Not at all  Social Connections: Moderately Isolated (08/25/2021)   Social Connection and Isolation Panel [NHANES]    Frequency of Communication with Friends and Family: More than three times a week    Frequency of Social Gatherings with Friends and Family: Once a week    Attends Religious Services: Never    Database administrator or Organizations: No    Attends Banker Meetings: Never    Marital Status: Married  Catering manager Violence: Not At Risk (03/10/2023)   Humiliation, Afraid, Rape, and Kick questionnaire    Fear of Current or Ex-Partner: No    Emotionally Abused: No    Physically Abused: No    Sexually Abused: No     FAMILY HISTORY:  Family History  Problem Relation Age of Onset   Diabetes Mother    Cancer Father    Heart disease Father    Stroke Father       REVIEW OF SYSTEMS:  Review of Systems  Constitutional:  Negative for chills and fever.  Respiratory:  Negative for cough  and shortness of breath.   Cardiovascular:  Negative for chest pain and palpitations.  Gastrointestinal:  Positive for abdominal pain (RUQ; Swelling). Negative for diarrhea, nausea and vomiting.  Genitourinary:  Negative for dysuria and urgency.    VITAL SIGNS:  Temp:  [98 F (36.7 C)-98.6 F (37 C)] 98.3 F (36.8 C) (11/13 0339) Pulse Rate:  [78-124] 88 (11/13 0339) Resp:  [16-18] 16 (11/13 0339) BP: (112-148)/(72-97) 118/73 (11/13 0339) SpO2:  [95 %-98 %] 95 % (11/13 0339) Weight:  [68.5 kg-70.3 kg] 68.5 kg (11/13 0500)     Height: 5\' 7"  (170.2 cm) Weight: 68.5 kg BMI (Calculated): 23.65   INTAKE/OUTPUT:  11/12 0701 - 11/13 0700 In: 258.1 [I.V.:158.1; IV Piggyback:100] Out: -   PHYSICAL EXAM:  Physical Exam Constitutional:      General: He is not in acute distress.    Appearance: Normal appearance. He is normal weight. He is not ill-appearing.     Comments: Resting in bed; NAD  HENT:     Head: Normocephalic and atraumatic.  Eyes:     General: No scleral icterus.    Conjunctiva/sclera: Conjunctivae normal.  Cardiovascular:     Rate and Rhythm: Normal rate.     Pulses: Normal pulses.     Heart sounds: No murmur heard. Pulmonary:     Effort: Pulmonary effort is normal. No respiratory distress.  Abdominal:     General: Abdomen is flat. There is no distension.     Palpations: Abdomen is soft.     Tenderness: There is no abdominal tenderness. There is no guarding or rebound.     Comments: To the RUQ there is a very obvious area of swelling and fluctuance, there are no overlaying skin changes, no erythema. Abdomen is otherwise soft, non-tender, non-distended.   Genitourinary:    Comments: Deferred Musculoskeletal:     Right lower leg: No edema.     Left lower leg: No edema.  Skin:    General: Skin is warm and dry.  Neurological:     General: No focal deficit present.  Mental Status: He is alert and oriented to person, place, and time.  Psychiatric:        Mood and  Affect: Mood normal.        Behavior: Behavior normal.      Labs:     Latest Ref Rng & Units 03/10/2023    3:37 AM 03/09/2023    4:45 PM 11/11/2018   10:12 AM  CBC  WBC 4.0 - 10.5 K/uL 11.2  14.0  8.5   Hemoglobin 13.0 - 17.0 g/dL 09.8  11.9  14.7   Hematocrit 39.0 - 52.0 % 34.3  39.4  44.1   Platelets 150 - 400 K/uL 388  484  322       Latest Ref Rng & Units 03/10/2023    3:37 AM 03/09/2023    4:45 PM 11/27/2022   11:03 AM  CMP  Glucose 70 - 99 mg/dL 829  562  96   BUN 8 - 23 mg/dL 9  13  14    Creatinine 0.61 - 1.24 mg/dL 1.30  8.65  7.84   Sodium 135 - 145 mmol/L 136  134  136   Potassium 3.5 - 5.1 mmol/L 3.4  3.5  4.1   Chloride 98 - 111 mmol/L 105  102  105   CO2 22 - 32 mmol/L 24  23  22    Calcium 8.9 - 10.3 mg/dL 8.7  8.8  9.0   Total Protein 6.5 - 8.1 g/dL 6.9  7.6    Total Bilirubin <1.2 mg/dL 0.4  0.3    Alkaline Phos 38 - 126 U/L 78  86    AST 15 - 41 U/L 15  18    ALT 0 - 44 U/L 9  10      Imaging studies:   CT Abdomen/Pelvis (03/09/2023 personally reviewed which shows RUQ fluid collection with large superficial component and smaller intra-peritoneal collection, this seem to communicate, question of retrain stone in RUQ, and radiologist report reviewed below:  IMPRESSION: 1. Interval increase in size of a perihepatic multi septated abscess or less likely mass. Etiology likely due to walled-off dropped gallstones from cholecystectomy/history of perforated cholecystitis. Differential diagnosis does include a malignancy/metastasis. Finding is located along the retroperitoneum and perihepatic space with extension through the right lateral abdominal wall into the right abdominal musculature with largest component measuring up to (8 x 6 x 10 cm). Recommend surgical consultation. 2. Colonic diverticulosis with no acute diverticulitis. 3. Trace right pleural effusion. 4.  Aortic Atherosclerosis (ICD10-I70.0).   Assessment/Plan:  72 y.o. male with RUQ  intra-abdominal and superficial fluid collection.   - Appreciate medicine admission - Discussed case with Dr Everlene Farrier and IR regarding images and case. Unfortunately, patient is on Plavix and is high risk for drainage of intra-peritoneal collection. We will plan for debridement and drainage of superficial portion in the OR this morning with Dr Everlene Farrier pending OR/Anesthesia availability.   - All risks, benefits, and alternatives to above procedure(s) were discussed with the patient and his family, all of their questions were answered to their expressed satisfaction, patient expresses he wishes to proceed, and informed consent was obtained.  - NPO for planned procedure - IV Abx (Zosyn); will get Cx - Monitor abdominal examination - Pain control prn; antiemetics prn - Mobilize as tolerated   - Further management per primary service; we will follow   All of the above findings and recommendations were discussed with the patient, and all of patient's questions were answered to his expressed satisfaction.  Thank you for the opportunity to participate in this patient's care.   -- Lynden Oxford, PA-C [facility] 03/10/2023, 7:29 AM M-F: 7am - 4pm

## 2023-03-10 NOTE — Assessment & Plan Note (Signed)
Antiplatelet therapy held.

## 2023-03-10 NOTE — Anesthesia Preprocedure Evaluation (Signed)
Anesthesia Evaluation  Patient identified by MRN, date of birth, ID band Patient awake    Reviewed: Allergy & Precautions, NPO status , Patient's Chart, lab work & pertinent test results  History of Anesthesia Complications (+) Family history of anesthesia reactionNegative for: history of anesthetic complications  Airway Mallampati: II  TM Distance: >3 FB Neck ROM: Full    Dental  (+) Poor Dentition   Pulmonary asthma , COPD,  COPD inhaler, Current Smoker and Patient abstained from smoking.   breath sounds clear to auscultation- rhonchi (-) wheezing      Cardiovascular Exercise Tolerance: Good hypertension, Pt. on medications + Peripheral Vascular Disease  (-) CAD, (-) Past MI, (-) Cardiac Stents and (-) CABG  Rhythm:Regular Rate:Normal - Systolic murmurs and - Diastolic murmurs    Neuro/Psych  PSYCHIATRIC DISORDERS Anxiety Depression    negative neurological ROS     GI/Hepatic Neg liver ROS, hiatal hernia,GERD  ,,  Endo/Other  diabetesHypothyroidism    Renal/GU      Musculoskeletal   Abdominal   Peds  Hematology negative hematology ROS (+)   Anesthesia Other Findings Past Medical History: No date: Anxiety     Comment:  PANIC ATTACKS No date: Arthritis     Comment:  right shoulder 02/12/2015: Atherosclerosis of aorta (HCC) No date: COPD (chronic obstructive pulmonary disease) (HCC) No date: Cough No date: Depression No date: Environmental and seasonal allergies 02/12/2015: Essential hypertension No date: Family history of adverse reaction to anesthesia     Comment:  Father - 92 - MI, then stroke after Prostate surgery No date: GERD (gastroesophageal reflux disease) No date: Hyperlipidemia No date: Hypertension No date: Hypothyroidism No date: Incisional hernia, without obstruction or gangrene 11/17/2016: PVD (peripheral vascular disease) (HCC) No date: Thyroid disease No date: Venous insufficiency No  date: Vertigo     Comment:  can happen daily No date: Wears hearing aid in both ears     Comment:  has, does not wear   Reproductive/Obstetrics                             Anesthesia Physical Anesthesia Plan  ASA: 3  Anesthesia Plan: General   Post-op Pain Management:    Induction: Intravenous  PONV Risk Score and Plan: 2 and Midazolam, Ondansetron, TIVA and Propofol infusion  Airway Management Planned: Natural Airway and Nasal Cannula  Additional Equipment:   Intra-op Plan:   Post-operative Plan:   Informed Consent: I have reviewed the patients History and Physical, chart, labs and discussed the procedure including the risks, benefits and alternatives for the proposed anesthesia with the patient or authorized representative who has indicated his/her understanding and acceptance.     Dental advisory given  Plan Discussed with: CRNA and Anesthesiologist  Anesthesia Plan Comments: (Patient consented for risks of anesthesia including but not limited to:  - adverse reactions to medications - risk of airway placement if required - damage to eyes, teeth, lips or other oral mucosa - nerve damage due to positioning  - sore throat or hoarseness - Damage to heart, brain, nerves, lungs, other parts of body or loss of life  Patient voiced understanding and assent.)       Anesthesia Quick Evaluation

## 2023-03-10 NOTE — Progress Notes (Signed)
Telemetry Box off patient, taken to PACU.

## 2023-03-10 NOTE — TOC CM/SW Note (Signed)
Transition of Care [facility]) - Inpatient Brief Assessment   Patient Details  Name: Joel Flores MRN: [id number] Date of Birth: 09-03-1950  Transition of Care [facility]) CM/SW Contact:    Chapman Fitch, RN Phone Number: 03/10/2023, 9:37 AM   Clinical Narrative:  Per surgery intra-peritoneal collection plan for debridement and drainage of superficial portion     Transition of Care (TOC) Screening Note   Patient Details  Name: Joel Flores Date of Birth: 1950-06-02   Transition of Care [facility]) CM/SW Contact:    Chapman Fitch, RN Phone Number: 03/10/2023, 9:37 AM    Transition of Care Department [facility]) has reviewed patient and no TOC needs have been identified at this time.If new patient transition needs arise, please place a TOC consult.   Transition of Care Asessment: Insurance and Status: Insurance coverage has been reviewed Patient has primary care physician: Yes     Prior/Current Home Services: No current home services Social Determinants of Health Reivew: SDOH reviewed no interventions necessary Readmission risk has been reviewed: Yes Transition of care needs: no transition of care needs at this time

## 2023-03-10 NOTE — Assessment & Plan Note (Signed)
Patient states that he does not have diabetes and he takes metformin he is prediabetic. Will defer to a.m. team for counseling on diabetes once patient is postprocedure.  Latest Reference Range & Units 11/03/21 08:59 11/27/21 10:37 05/06/22 09:18 09/04/22 09:42 11/27/22 11:03  Hemoglobin A1C 4.8 - 5.6 % 7.1 (H) 7.0 (H) 6.7 (H) 6.7 (H) 6.8 (H)  Glycemic protocol.

## 2023-03-10 NOTE — Plan of Care (Signed)

## 2023-03-10 NOTE — Assessment & Plan Note (Signed)
Vitals:   03/09/23 1643 03/09/23 1958 03/09/23 2030 03/09/23 2339  BP: (!) 129/97 (!) 148/83 138/84 129/74  Amlodipine / cozaar continued.

## 2023-03-10 NOTE — Transfer of Care (Signed)
Immediate Anesthesia Transfer of Care Note  Patient: Joel Flores  Procedure(s) Performed: IRRIGATION AND DEBRIDEMENT ABDOMINAL WALL ABSCESS  Patient Location: PACU  Anesthesia Type:General  Level of Consciousness: drowsy  Airway & Oxygen Therapy: Patient Spontanous Breathing and Patient connected to face mask oxygen  Post-op Assessment: Report given to RN  Post vital signs: stable  Last Vitals:  Vitals Value Taken Time  BP    Temp    Pulse    Resp    SpO2      Last Pain:  Vitals:   03/10/23 1005  TempSrc: Temporal  PainSc: 3          Complications: No notable events documented.

## 2023-03-10 NOTE — Assessment & Plan Note (Signed)
Continue Zoloft at 100 mg.

## 2023-03-10 NOTE — Op Note (Signed)
  03/10/2023  11:34 AM  PATIENT:  Joel Flores  72 y.o. male  PRE-OPERATIVE DIAGNOSIS: Abdominal wall abscess extending intraperitoneally  POST-OPERATIVE DIAGNOSIS:  Same  PROCEDURE:   1. Incision and drainage of complex  abdominal wall abscess to intraperitoneal extension 2. Excisional debridement of skin subcutaneous tissue and muscle measuring 25 square centimeters    SURGEON:  Surgeons and Role:    * Krystal Teachey F, MD - Primary   ANESTHESIA: General MAC  FINDINGS: large abdominal wall abscess extending to the peritoneal cavity, drained about 250cc of pus  DICTATION:  Patient was explained about the procedure in detail, risk benefits possible complications and a consent was obtained. The patient taken to the operating room and placed in the supine position.   Elliptical incision was created on the RUQ and pus was drained and cultured. About 250cc of yellow foul smelling pus was drained. There was some luculations that we were able to lyse with a combination of finger fracture and suction device. All the loculations were broken down. We inspected the cavity and the cavity also extended into the intraperitoneal cavity, there seems to be a defect of about 2 cms within the peritoneum, the liver seemed to be visualized but given inflammation it was impossible to discern liver parenchyma. Using a sharp curette we gently debrided the sub q tissue down to the muscle to include fascia. There was undermining that measured 5x5 cms. Hemostasis was obtained with electrocautery. Irrigation with normal saline and the wound was packed with conform rolled gauze and dressing placed. Needle and laparotomy counts were correct and there were no immediate complications  Leafy Ro, MD

## 2023-03-10 NOTE — Assessment & Plan Note (Signed)
Secondary to patient's progressive mass and acute worsening with possible strain from him using his lawnmower. As needed Tylenol

## 2023-03-10 NOTE — Assessment & Plan Note (Signed)
Patient meet sepsis criteria with heart rate white count and infection source being his hepatic abscess. Most likely etiology in this case is gram-negative bacteremia from E. coli or gram-negative rods. Blood cultures ordered will also obtain a urine culture.  I will continue patient on Zosyn and additional supportive measures as deemed appropriate or necessary.

## 2023-03-10 NOTE — ED Notes (Signed)
Telephoned unit pt is to be transferred to. Unit is awaiting telemetry monitoring box for pt, then will be able to accept pt to unit, staff to call this rn when ready.

## 2023-03-10 NOTE — Assessment & Plan Note (Signed)
Nicotine patch.  Patient counseled briefly on tobacco cessation as he was eating.

## 2023-03-11 DIAGNOSIS — E039 Hypothyroidism, unspecified: Secondary | ICD-10-CM | POA: Diagnosis not present

## 2023-03-11 DIAGNOSIS — K750 Abscess of liver: Secondary | ICD-10-CM

## 2023-03-11 DIAGNOSIS — F17200 Nicotine dependence, unspecified, uncomplicated: Secondary | ICD-10-CM

## 2023-03-11 DIAGNOSIS — I10 Essential (primary) hypertension: Secondary | ICD-10-CM

## 2023-03-11 DIAGNOSIS — F419 Anxiety disorder, unspecified: Secondary | ICD-10-CM

## 2023-03-11 DIAGNOSIS — L02211 Cutaneous abscess of abdominal wall: Secondary | ICD-10-CM | POA: Diagnosis not present

## 2023-03-11 LAB — CBC
HCT: 34.4 % — ABNORMAL LOW (ref 39.0–52.0)
Hemoglobin: 11.5 g/dL — ABNORMAL LOW (ref 13.0–17.0)
MCH: 27.4 pg (ref 26.0–34.0)
MCHC: 33.4 g/dL (ref 30.0–36.0)
MCV: 81.9 fL (ref 80.0–100.0)
Platelets: 397 10*3/uL (ref 150–400)
RBC: 4.2 MIL/uL — ABNORMAL LOW (ref 4.22–5.81)
RDW: 15.2 % (ref 11.5–15.5)
WBC: 8.5 10*3/uL (ref 4.0–10.5)
nRBC: 0 % (ref 0.0–0.2)

## 2023-03-11 LAB — BASIC METABOLIC PANEL
Anion gap: 10 (ref 5–15)
BUN: 16 mg/dL (ref 8–23)
CO2: 23 mmol/L (ref 22–32)
Calcium: 8.4 mg/dL — ABNORMAL LOW (ref 8.9–10.3)
Chloride: 102 mmol/L (ref 98–111)
Creatinine, Ser: 0.56 mg/dL — ABNORMAL LOW (ref 0.61–1.24)
GFR, Estimated: >60 mL/min (ref >60)
Glucose, Bld: 113 mg/dL — ABNORMAL HIGH (ref 70–99)
Potassium: 3.4 mmol/L — ABNORMAL LOW (ref 3.5–5.1)
Sodium: 135 mmol/L (ref 135–145)

## 2023-03-11 LAB — URINE CULTURE: Culture: NO GROWTH

## 2023-03-11 LAB — SURGICAL PATHOLOGY

## 2023-03-11 MED ORDER — POTASSIUM CHLORIDE 20 MEQ PO PACK
40.0000 meq | PACK | Freq: Once | ORAL | Status: AC
  Administered 2023-03-11: 40 meq via ORAL
  Filled 2023-03-11: qty 2

## 2023-03-11 NOTE — Progress Notes (Addendum)
[facility] SUR[facility]ICAL PROGRESS NOTE  Hospital Day(s): 1.   Post op day(s): 1 Day Post-Op.   Interval History:  Patient seen and examined No acute events or new complaints overnight.  Patient reports he is doing well No significant pain No fever, chills Leukocytosis is resolved; WBC 8.5K Hgb to 11.5 Mild hypokalemia to 3.4 Renal function normal; sCr - 0.56; UO - 800 ccs CX from OR without growth to date Continues on Zosyn   Vital signs in last 24 hours: [min-max] current  Temp:  [97.2 F (36.2 C)-98.2 F (36.8 C)] 97.6 F (36.4 C) (11/14 0743) Pulse Rate:  [78-90] 81 (11/14 0743) Resp:  [16-20] 16 (11/14 0743) BP: (104-137)/(68-86) 132/86 (11/14 0743) SpO2:  [94 %-96 %] 95 % (11/14 0743) Weight:  [68.5 kg] 68.5 kg (11/13 1005)     Height: 5\' 7"  (170.2 cm) Weight: 68.5 kg BMI (Calculated): 23.65   Intake/Output last 2 shifts:  11/13 0701 - 11/14 0700 In: 935.9 [P.O.:720; I.V.:150; IV Piggyback:65.9] Out: 800 [Urine:800]   Physical Exam:  Constitutional: alert, cooperative and no distress  Respiratory: breathing non-labored at rest  Cardiovascular: regular rate and sinus rhythm  Integumentary: I&D to the right upper quadrant of the abdominal wall, swelling markedly improved, there is small amount of bleeding superficially controled with pressure. Dressing changed.   Labs:     Latest Ref Rng & Units 03/11/2023    4:44 AM 03/10/2023    3:37 AM 03/09/2023    4:45 PM  CBC  WBC 4.0 - 10.5 K/uL 8.5  11.2  14.0   Hemoglobin 13.0 - 17.0 g/dL 16.1  09.6  04.5   Hematocrit 39.0 - 52.0 % 34.4  34.3  39.4   Platelets 150 - 400 K/uL 397  388  484       Latest Ref Rng & Units 03/11/2023    4:44 AM 03/10/2023    3:37 AM 03/09/2023    4:45 PM  CMP  Glucose 70 - 99 mg/dL 409  811  914   BUN 8 - 23 mg/dL 16  9  13    Creatinine 0.61 - 1.24 mg/dL 7.82  9.56  2.13   Sodium 135 - 145 mmol/L 135  136  134   Potassium 3.5 - 5.1 mmol/L 3.4  3.4  3.5   Chloride 98  - 111 mmol/L 102  105  102   CO2 22 - 32 mmol/L 23  24  23    Calcium 8.9 - 10.3 mg/dL 8.4  8.7  8.8   Total Protein 6.5 - 8.1 g/dL  6.9  7.6   Total Bilirubin <1.2 mg/dL  0.4  0.3   Alkaline Phos 38 - 126 U/L  78  86   AST 15 - 41 U/L  15  18   ALT 0 - 44 U/L  9  10     Imaging studies: No new pertinent imaging studies   Assessment/Plan:  72 y.o. male 1 Day Post-Op s/p excisional debridement of RUQ abdominal wall abscess   - We will plan to repeat CT Abdomen/Pelvis tomorrow (11/15) with IV contrast to reassess intra-abdominal component of his collections. If there remains a significant internal component to this, will discuss with IR to set up outpatient drain placement once Plavix has washed out.   - Wound Care: Pack RUQ wound with saline moistened 1" rolled gauze, cover, secure. This should be done daily at minimum.   - Continue IV Abx (Zosyn); follow up Cx   -  Continue to hold Plavix   - Pain control prn; antiemetics prn    - Monitor leukocytosis; resolved  - I will also consult for [facility] today as well   - Further management per primary service; we will follow    - Discharge Planning: Doing well, will plan for repeat imaging tomorrow to establish plan regarding intra-abdominal component of his RUQ collection(s). Potentially home tomorrow (11/15).   All of the above findings and recommendations were discussed with the patient, patient's family (wife at bedside), and the medical team, and all of patient's and family's questions were answered to their expressed satisfaction.  -- Joel Oxford, PA-C [facility] 03/11/2023, 9:01 AM M-F: 7am - 4pm

## 2023-03-11 NOTE — Progress Notes (Signed)
Progress Note   Patient: Joel Flores ZOX:[id number] DOB: [birth year] DOA: 03/09/2023     1 DOS: the patient was seen and examined on 03/11/2023   Brief hospital course: Joel Flores is a 72 y.o. male with medical history significant for current tobacco abuse about 1-1/2 pack/day for the past 50 years, occasional alcohol,, sulfa allergy, essential hypertension, GERD, hypothyroidism, anxiety, bilateral carotid artery stenosis, colonic polyps, diabetes mellitus type 2 with a last A1c of 6.8 in August, noninvasive melanoma status post recent resection on his upper middle back, peripheral vascular disease on Plavix, history of lap cholecystectomy for perforated gallbladder in 2018 presents today after being seen at urgent care earlier today with complaints of right flank pain, swelling, that has been going on since August of this year, and now progressively getting worse.  CT of the abdomen done to evaluate patient's masslike area on the right flank shows:perihepatic multi septated abscess or less likely mass. Etiology likely due to walled-off dropped gallstones from cholecystectomy/history of perforated cholecystitis. Differential diagnosis does include a malignancy/metastasis. Finding is located along the retroperitoneum and perihepatic space with extension through the right lateral abdominal wall into the right abdominal musculature with largest component measuring up to (8 x 6 x 10 cm).  Surgery team evaluated him took to OR for incision and drainage of complex abdominal wall abscess to intraperitoneal extension   Assessment and Plan: Sepsis secondary to intra-abdominal abscess Status post incision and drainage of complex abdominal wall abscess to intraperitoneal extension by surgery team. Continue IV Zosyn therapy. Follow fluid culture results. Hb stable. Per surgery patient will get repeat CT scan tomorrow to assess intra-abdominal abscess and extension.  Type 2 diabetes mellitus: Continue  Accu-Cheks, sliding scale insulin. Carbohydrate consistent diet.  Tobacco use disorder: Continue nicotine patch.  Peripheral vascular disease: Antiplatelet therapy on hold due to surgical intervention.  Anxiety: Continue Zoloft therapy.  Hypothyroidism:  Home dose levothyroxine started.  Essential hypertension: Continue home dose Cozaar, amlodipine.        Out of bed to chair. Incentive spirometry. Nursing supportive care. Fall, aspiration precautions. DVT prophylaxis   Code Status: Full Code  Subjective: Patient is seen and examined today morning.  He does not have any abdominal pain or discomfort.  Able to tolerate diet well.  Denies nausea or vomiting.  Physical Exam: Vitals:   03/10/23 1622 03/10/23 2007 03/11/23 0342 03/11/23 0743  BP: 128/81 118/79 120/82 132/86  Pulse: 79 90 82 81  Resp: 16 20 16 16   Temp: 98.2 F (36.8 C) 97.7 F (36.5 C) (!) 97.5 F (36.4 C) 97.6 F (36.4 C)  TempSrc: Oral Oral  Oral  SpO2: 96% 95% 94% 95%  Weight:      Height:        General - Elderly Caucasian male, no apparent distress HEENT - PERRLA, EOMI, atraumatic head, non tender sinuses. Lung - Clear, no rales, rhonchi, wheezes. Heart - S1, S2 heard, no murmurs, rubs, no pedal edema. Abdomen - Soft, RUQ dressing, bowel sounds good Neuro - Alert, awake and oriented x 3, non focal exam. Skin - Warm and dry.  Data Reviewed:      Latest Ref Rng & Units 03/11/2023    4:44 AM 03/10/2023    3:37 AM 03/09/2023    4:45 PM  CBC  WBC 4.0 - 10.5 K/uL 8.5  11.2  14.0   Hemoglobin 13.0 - 17.0 g/dL 81.1  91.4  78.2   Hematocrit 39.0 - 52.0 % 34.4  34.3  39.4   Platelets 150 - 400 K/uL 397  388  484       Latest Ref Rng & Units 03/11/2023    4:44 AM 03/10/2023    3:37 AM 03/09/2023    4:45 PM  BMP  Glucose 70 - 99 mg/dL 132  440  102   BUN 8 - 23 mg/dL 16  9  13    Creatinine 0.61 - 1.24 mg/dL 7.25  3.66  4.40   Sodium 135 - 145 mmol/L 135  136  134   Potassium 3.5 -  5.1 mmol/L 3.4  3.4  3.5   Chloride 98 - 111 mmol/L 102  105  102   CO2 22 - 32 mmol/L 23  24  23    Calcium 8.9 - 10.3 mg/dL 8.4  8.7  8.8    CT ABDOMEN PELVIS W CONTRAST  Result Date: 03/09/2023 CLINICAL DATA:  Hernia suspected, abdominal wall Started in August and growing in size. History of melanoma. History of perforated cholecystitis. EXAM: CT ABDOMEN AND PELVIS WITH CONTRAST TECHNIQUE: Multidetector CT imaging of the abdomen and pelvis was performed using the standard protocol following bolus administration of intravenous contrast. RADIATION DOSE REDUCTION: This exam was performed according to the departmental dose-optimization program which includes automated exposure control, adjustment of the mA and/or kV according to patient size and/or use of iterative reconstruction technique. CONTRAST:  OMNIPAQUE IOHEXOL 300 MG/ML  SOLN COMPARISON:  CT chest 09/25/2022, ultrasound abdomen 08/17/2016, ultrasound abdomen 12/23/2017 FINDINGS: Lower chest: Trace right pleural effusion. Hepatobiliary: No focal liver abnormality. Status post cholecystectomy. No biliary dilatation. Pancreas: No focal lesion. Normal pancreatic contour. No surrounding inflammatory changes. No main pancreatic ductal dilatation. Spleen: Normal in size without focal abnormality. Adrenals/Urinary Tract: No adrenal nodule bilaterally. Bilateral kidneys enhance symmetrically. No hydronephrosis. No hydroureter. The urinary bladder is unremarkable. On delayed imaging, there is no urothelial wall thickening and there are no filling defects in the opacified portions of the bilateral collecting systems or ureters. Stomach/Bowel: Stomach is within normal limits. No evidence of bowel wall thickening or dilatation. Colonic diverticulosis. Unremarkable appendix. Vascular/Lymphatic: No abdominal aorta or iliac aneurysm. Severe atherosclerotic plaque of the aorta and its branches. No abdominal, pelvic, or inguinal lymphadenopathy. Reproductive:  Prostate is unremarkable. Other: No intraperitoneal free fluid. No intraperitoneal free gas. Interval increase in size of a perihepatic, now fully visualized (previously only partially visualized on CT chest 2021, 2022, 2023), large lobulated fluid density lesion within thick walls and multiple septation as well as several layering calcified stones (2:37). Finding is located along the retroperitoneum and perihepatic space with extension through the right lateral abdominal wall into the right abdominal musculature with largest component measuring up to (8 x 2 x 10 cm). Perihepatic component measures up to at least 13 cm in the craniocaudal dimension and approximately up to 8 x 3 cm on axial imaging. Musculoskeletal: No abdominal wall hernia or abnormality. No suspicious lytic or blastic osseous lesions. No acute displaced fracture. Multilevel degenerative changes of the spine. Mild retrolisthesis of L5 on S1 IMPRESSION: 1. Interval increase in size of a perihepatic multi septated abscess or less likely mass. Etiology likely due to walled-off dropped gallstones from cholecystectomy/history of perforated cholecystitis. Differential diagnosis does include a malignancy/metastasis. Finding is located along the retroperitoneum and perihepatic space with extension through the right lateral abdominal wall into the right abdominal musculature with largest component measuring up to (8 x 6 x 10 cm). Recommend surgical consultation. 2. Colonic diverticulosis with no acute diverticulitis.  3. Trace right pleural effusion. 4.  Aortic Atherosclerosis (ICD10-I70.0). Electronically Signed   By: Tish Frederickson M.D.   On: 03/09/2023 23:12     Family Communication: Discussed with patient, wife at bedside. They understand and agree. All questions answereed.    Disposition: Status is: Inpatient Remains inpatient appropriate because: IV antibiotics for intraabd abscess  Planned Discharge Destination: Home with Home  Health     Time spent: 38 minutes  Author: Marcelino Duster, MD 03/11/2023 2:47 PM Secure chat 7am to 7pm For on call review [email or web address].

## 2023-03-11 NOTE — Plan of Care (Signed)

## 2023-03-11 NOTE — Plan of Care (Signed)
  Problem: Safety: Goal: Ability to remain free from injury will improve Outcome: Progressing   Problem: Pain Management: Goal: General experience of comfort will improve Outcome: Progressing

## 2023-03-11 NOTE — TOC Initial Note (Signed)
Transition of Care [facility]) - Initial/Assessment Note    Patient Details  Name: Joel Flores MRN: [id number] Date of Birth: 02-Aug-1950  Transition of Care [facility]) CM/SW Contact:    Chapman Fitch, RN Phone Number: 03/11/2023, 4:15 PM  Clinical Narrative:                  Admitted for:1 Day Post-Op s/p excisional debridement of RUQ abdominal wall abscess  Admitted from: home with wife PCP: Yetta Barre Current home health/prior home health/DME: NA  Patient will require packing dressing changes daily. Wife at bedside and was show dressing changes today, and will be educated again tomorrow. Agreeable to home health and both patient and wife are aware that home health will not be coming out daily, and wife will be responsible for dressing changes.  Patient agreeable to home health.  States he does not have a preference of agency.  CMS Medicare.gov Compare Post Acute Care list reviewed with patient.  Referral made and accepted by [facility]          Patient Goals and CMS Choice            Expected Discharge Plan and Services                                              Prior Living Arrangements/Services                       Activities of Daily Living   ADL Screening (condition at time of admission) Independently performs ADLs?: Yes (appropriate for developmental age) Is the patient deaf or have difficulty hearing?: Yes Does the patient have difficulty seeing, even when wearing glasses/contacts?: No Does the patient have difficulty concentrating, remembering, or making decisions?: No  Permission Sought/Granted                  Emotional Assessment              Admission diagnosis:  Perihepatic abscess (HCC) [K65.0] Abdominal pain [R10.9] Patient Active Problem List   Diagnosis Date Noted   Hepatic abscess 03/10/2023   Abdominal pain 03/10/2023   DM (diabetes mellitus) (HCC) 03/10/2023   Sepsis (HCC) 03/10/2023    Abdominal wall abscess 03/10/2023   Bilateral carotid artery stenosis 09/20/2019   Colon cancer screening    Polyp of sigmoid colon    SOBOE (shortness of breath on exertion) 07/30/2017   PVD (peripheral vascular disease) (HCC) 11/17/2016   Tobacco use disorder 11/17/2016   Reactive airway disease, mild intermittent, uncomplicated 08/12/2016   Allergic rhinitis due to pollen 08/13/2015   Nocturia 08/13/2015   Atherosclerosis of aorta (HCC) 02/12/2015   Essential hypertension 02/12/2015   Esophageal reflux 02/12/2015   Depression 02/12/2015   Adult hypothyroidism 02/12/2015   Chronic anxiety 02/12/2015   Hyperlipemia 02/12/2015   PCP:  Duanne Limerick, MD Pharmacy:   [facility] [phone] - [place], [place] - [address] [place] [address] Phone: [phone] Fax: [phone]  [facility] ORDER) ELECTRONIC - [place], [place] - [address] [place] [address] Phone: [phone] Fax: [phone]  [facility]) - [place], [place] - [address] [facility] [address] [place] [address] [place]  [address] Phone: [phone] Fax: [phone]     Social  Determinants of Health (SDOH) Social History: SDOH Screenings   Food Insecurity: No Food Insecurity (03/10/2023)  Housing: Low Risk  (03/10/2023)  Transportation Needs: No Transportation Needs (03/10/2023)  Utilities: Not At Risk (03/10/2023)  Alcohol Screen: Low Risk  (09/04/2022)  Depression (PHQ2-9): Low Risk  (03/09/2023)  Financial Resource Strain: Low Risk  (09/04/2022)  Physical Activity: Inactive (08/25/2021)  Social Connections: Moderately Isolated (08/25/2021)  Stress: No Stress Concern Present (09/04/2022)  Tobacco Use: High Risk (03/10/2023)   SDOH Interventions:     Readmission Risk Interventions     No data to display

## 2023-03-12 ENCOUNTER — Inpatient Hospital Stay: Payer: Medicare PPO

## 2023-03-12 DIAGNOSIS — E1165 Type 2 diabetes mellitus with hyperglycemia: Secondary | ICD-10-CM

## 2023-03-12 LAB — MRSA CULTURE: Culture: NOT DETECTED

## 2023-03-12 MED ORDER — IOHEXOL 300 MG/ML  SOLN
100.0000 mL | Freq: Once | INTRAMUSCULAR | Status: AC | PRN
Start: 2023-03-12 — End: 2023-03-12
  Administered 2023-03-12: 100 mL via INTRAVENOUS

## 2023-03-12 MED ORDER — IOHEXOL 9 MG/ML PO SOLN
500.0000 mL | ORAL | Status: AC
  Administered 2023-03-12 (×2): 500 mL via ORAL

## 2023-03-12 MED ORDER — AMOXICILLIN-POT CLAVULANATE 500-125 MG PO TABS
1.0000 | ORAL_TABLET | Freq: Two times a day (BID) | ORAL | 0 refills | Status: AC
Start: 2023-03-12 — End: 2023-03-22

## 2023-03-12 NOTE — TOC Transition Note (Signed)
Transition of Care [facility]) - CM/SW Discharge Note   Patient Details  Name: Joel Flores MRN: [id number] Date of Birth: 1951-01-29  Transition of Care [facility]) CM/SW Contact:  Chapman Fitch, RN Phone Number: 03/12/2023, 10:56 AM   Clinical Narrative:     Patient to discharge today Surgery has educated wife on dressing changes Requested bedside Rn to provide patient with some dressing supplies at discharge [facility] notified of discharge Wife updated on the name of the accepting agency         Patient Goals and CMS Choice      Discharge Placement                         Discharge Plan and Services Additional resources added to the After Visit Summary for                                       Social Determinants of Health (SDOH) Interventions SDOH Screenings   Food Insecurity: No Food Insecurity (03/10/2023)  Housing: Low Risk  (03/10/2023)  Transportation Needs: No Transportation Needs (03/10/2023)  Utilities: Not At Risk (03/10/2023)  Alcohol Screen: Low Risk  (09/04/2022)  Depression (PHQ2-9): Low Risk  (03/09/2023)  Financial Resource Strain: Low Risk  (09/04/2022)  Physical Activity: Inactive (08/25/2021)  Social Connections: Moderately Isolated (08/25/2021)  Stress: No Stress Concern Present (09/04/2022)  Tobacco Use: High Risk (03/10/2023)     Readmission Risk Interventions     No data to display

## 2023-03-12 NOTE — Plan of Care (Signed)
Adequate for discharge  Joel Flores V [name]  

## 2023-03-12 NOTE — Plan of Care (Signed)
  Problem: Education: Goal: Knowledge of General Education information will improve Description Including pain rating scale, medication(s)/side effects and non-pharmacologic comfort measures Outcome: Progressing   

## 2023-03-12 NOTE — Progress Notes (Signed)
[facility] SURGICAL PROGRESS NOTE  Hospital Day(s): 2.   Post op day(s): 2 Days Post-Op.   Interval History:  Patient seen and examined No acute events or new complaints overnight.  Patient reports he is doing well No significant pain No fever, chills No new labs  CX from OR without growth to date Continues on Zosyn  CT this morning reassuring with improvement in intra-abdominal portion of his collection(s)  Vital signs in last 24 hours: [min-max] current  Temp:  [97.4 F (36.3 C)-98.1 F (36.7 C)] 97.5 F (36.4 C) (11/15 0431) Pulse Rate:  [67-101] 67 (11/15 0431) Resp:  [16-18] 16 (11/15 0431) BP: (117-130)/(72-80) 130/72 (11/15 0431) SpO2:  [93 %-98 %] 98 % (11/15 0431)     Height: 5\' 7"  (170.2 cm) Weight: 68.5 kg BMI (Calculated): 23.65   Intake/Output last 2 shifts:  11/14 0701 - 11/15 0700 In: 240 [P.O.:240] Out: -    Physical Exam:  Constitutional: alert, cooperative and no distress  Respiratory: breathing non-labored at rest  Cardiovascular: regular rate and sinus rhythm  Integumentary: I&D to the right upper quadrant of the abdominal wall, swelling markedly improved, there is small amount of bleeding superficially controled with pressure. Dressing changed.   Labs:     Latest Ref Rng & Units 03/11/2023    4:44 AM 03/10/2023    3:37 AM 03/09/2023    4:45 PM  CBC  WBC 4.0 - 10.5 K/uL 8.5  11.2  14.0   Hemoglobin 13.0 - 17.0 g/dL 64.3  32.9  51.8   Hematocrit 39.0 - 52.0 % 34.4  34.3  39.4   Platelets 150 - 400 K/uL 397  388  484       Latest Ref Rng & Units 03/11/2023    4:44 AM 03/10/2023    3:37 AM 03/09/2023    4:45 PM  CMP  Glucose 70 - 99 mg/dL 841  660  630   BUN 8 - 23 mg/dL 16  9  13    Creatinine 0.61 - 1.24 mg/dL 1.60  1.09  3.23   Sodium 135 - 145 mmol/L 135  136  134   Potassium 3.5 - 5.1 mmol/L 3.4  3.4  3.5   Chloride 98 - 111 mmol/L 102  105  102   CO2 22 - 32 mmol/L 23  24  23    Calcium 8.9 - 10.3 mg/dL 8.4  8.7   8.8   Total Protein 6.5 - 8.1 g/dL  6.9  7.6   Total Bilirubin <1.2 mg/dL  0.4  0.3   Alkaline Phos 38 - 126 U/L  78  86   AST 15 - 41 U/L  15  18   ALT 0 - 44 U/L  9  10     Imaging studies: No new pertinent imaging studies   Assessment/Plan:  72 y.o. male 2 Days Post-Op s/p excisional debridement of RUQ abdominal wall abscess   - CT Abdomen/Pelvis reviewed this AM with improvement in intra-abdominal components of this abscess. Reviewed with IR and felt no need to place drain given improvements.    - Wound Care: Pack RUQ wound with saline moistened 1" rolled gauze, cover, secure. This should be done daily at minimum.   - Continue IV Abx (Zosyn); follow up Cx - Augmentin for home.   - Continue to hold Plavix   - Pain control prn; antiemetics prn    - Monitor leukocytosis; resolved x48  - Further management per primary service; we will follow    -  Discharge Planning: CT reassuring, patient and wife educated on wound care and HH obtained. Okay for discharge home from surgical perspective. Antibiotics as above. We will follow up with him on 11/27.   All of the above findings and recommendations were discussed with the patient, patient's family (wife at bedside), and the medical team, and all of patient's and family's questions were answered to their expressed satisfaction.  -- Lynden Oxford, PA-C [facility] 03/12/2023, 8:28 AM M-F: 7am - 4pm

## 2023-03-12 NOTE — Discharge Instructions (Signed)
In addition to included general post-operative instructions,  Wound care: Pack wound with saline moistened 1" rolled gauze, cover, secure. This should be done daily at minimum. Can be changed two times a day and as needed as well. It is okay to remove entire dressing and take a short shower. Replace afterwards.   Call office [phone] - Surgery) at any time if any questions, worsening pain, fevers/chills, bleeding, drainage from incision site, or other concerns.

## 2023-03-12 NOTE — Care Management Important Message (Signed)
Important Message  Patient Details  Name: Joel Flores MRN: [id number] Date of Birth: Nov 27, 1950   Important Message Given:  N/A - LOS <3 / Initial given by admissions     [name] [name] 03/12/2023, 2:34 PM

## 2023-03-12 NOTE — Discharge Summary (Signed)
Physician Discharge Summary   Patient: Joel Flores MRN: [id number] DOB: 17-Feb-1951  Admit date:     03/09/2023  Discharge date: 03/12/23  Discharge Physician: Marcelino Duster   PCP: Duanne Limerick, MD   Recommendations at discharge:    PCP follow up in 1 week. Surgery follow up as scheduled.  Discharge Diagnoses: Principal Problem:   Abdominal pain Active Problems:   Hepatic abscess   Essential hypertension   Adult hypothyroidism   Chronic anxiety   PVD (peripheral vascular disease) (HCC)   Tobacco use disorder   DM (diabetes mellitus) (HCC)   Sepsis (HCC)   Abdominal wall abscess  Resolved Problems:   * No resolved hospital problems. *  Hospital Course: [place]TON BIA is a 72 y.o. male with medical history significant for current tobacco abuse about 1-1/2 pack/day for the past 50 years, occasional alcohol,, sulfa allergy, essential hypertension, GERD, hypothyroidism, anxiety, bilateral carotid artery stenosis, colonic polyps, diabetes mellitus type 2 with a last A1c of 6.8 in August, noninvasive melanoma status post recent resection on his upper middle back, peripheral vascular disease on Plavix, history of lap cholecystectomy for perforated gallbladder in 2018 presents today after being seen at urgent care earlier today with complaints of right flank pain, swelling, that has been going on since August of this year, and now progressively getting worse.  CT of the abdomen done to evaluate patient's masslike area on the right flank shows:perihepatic multi septated abscess or less likely mass. Etiology likely due to walled-off dropped gallstones from cholecystectomy/history of perforated cholecystitis. Differential diagnosis does include a malignancy/metastasis. Finding is located along the retroperitoneum and perihepatic space with extension through the right lateral abdominal wall into the right abdominal musculature with largest component measuring up to (8 x 6 x 10 cm).   Surgery team evaluated him took to OR for incision and drainage of complex abdominal wall abscess to intraperitoneal extension.  Patient is continued on IV Zosyn therapy. Repeat CT abdomen done today showed collections much improved and there was no need for placement of drain.  Surgery team advised Augmentin therapy for home, scripts given.  Patient is hemodynamically stable to be discharged home.  Advised him to follow-up with PCP, surgery clinic as suggested.  Patient and wife understand and agree with the discharge plan.    Consultants: Surgery Procedures performed: Incision and drainage of complex  abdominal wall abscess to intraperitoneal extension   Disposition: Home Diet recommendation:  Discharge Diet Orders (From admission, onward)     Start     Ordered   03/12/23 0000  Diet - low sodium heart healthy        03/12/23 1048           Cardiac and Carb modified diet DISCHARGE MEDICATION: Allergies as of 03/12/2023       Reactions   Sulfa Antibiotics Rash        Medication List     TAKE these medications    acetaminophen 325 MG tablet Commonly known as: TYLENOL Take 325 mg by mouth daily as needed for moderate pain or headache.   albuterol 108 (90 Base) MCG/ACT inhaler Commonly known as: VENTOLIN HFA Inhale into the lungs. Inhale 2 inhalations into the lungs 4 (four) times daily PRN   amLODipine 5 MG tablet Commonly known as: NORVASC Take 5 mg by mouth daily.   amoxicillin-clavulanate 500-125 MG tablet Commonly known as: Augmentin Take 1 tablet by mouth every 12 (twelve) hours for 10 days.   azelastine 0.1 %  nasal spray Commonly known as: ASTELIN Place 2 sprays into both nostrils 2 (two) times daily. Use in each nostril as directed   cetirizine 10 MG tablet Commonly known as: ZYRTEC Take 1 tablet (10 mg total) by mouth daily.   clopidogrel 75 MG tablet Commonly known as: PLAVIX Take 1 tablet (75 mg total) by mouth daily.   fluticasone 50 MCG/ACT  nasal spray Commonly known as: FLONASE SPRAY 2 SPRAYS INTO EACH NOSTRIL EVERY DAY   ibuprofen 200 MG tablet Commonly known as: ADVIL Take 400 mg by mouth every 8 (eight) hours as needed (for pain).   levothyroxine 112 MCG tablet Commonly known as: SYNTHROID TAKE 1 TABLET BY MOUTH EVERY DAY   losartan 100 MG tablet Commonly known as: COZAAR Take 100 mg by mouth daily.   metFORMIN 500 MG tablet Commonly known as: GLUCOPHAGE TAKE 1 TABLET BY MOUTH EVERY DAY WITH BREAKFAST   pantoprazole 40 MG tablet Commonly known as: PROTONIX Take 1 tablet (40 mg total) by mouth daily.   rosuvastatin 40 MG tablet Commonly known as: CRESTOR Take 40 mg by mouth daily.   sertraline 100 MG tablet Commonly known as: ZOLOFT Take 1 tablet (100 mg total) by mouth daily.               Discharge Care Instructions  (From admission, onward)           Start     Ordered   03/12/23 0000  Discharge wound care:       Comments: change packing on wound daily conform rolled gauze ( small) followed by ABd and tape   03/12/23 1048            Follow-up Information     Duanne Limerick, MD Follow up in 1 week(s).   Specialty: Family Medicine Contact information: [address] [place] [address] [phone]         [name] F, MD Follow up in 1 week(s).   Specialty: General Surgery Contact information: [address] [place] [address] [phone]                Discharge Exam: Ceasar Mons Weights   03/09/23 1643 03/10/23 0500 03/10/23 1005  Weight: 70.3 kg 68.5 kg 68.5 kg   General - Elderly Caucasian male, no apparent distress HEENT - PERRLA, EOMI, atraumatic head, non tender sinuses. Lung - Clear, no rales, rhonchi, wheezes. Heart - S1, S2 heard, no murmurs, rubs, no pedal edema. Abdomen - Soft, RUQ dressing, bowel sounds good Neuro - Alert, awake and oriented x 3, non focal exam. Skin - Warm and dry.  Condition at discharge:  stable  The results of significant diagnostics from this hospitalization (including imaging, microbiology, ancillary and laboratory) are listed below for reference.   Imaging Studies: CT ABDOMEN PELVIS W CONTRAST  Result Date: 03/09/2023 CLINICAL DATA:  Hernia suspected, abdominal wall Started in August and growing in size. History of melanoma. History of perforated cholecystitis. EXAM: CT ABDOMEN AND PELVIS WITH CONTRAST TECHNIQUE: Multidetector CT imaging of the abdomen and pelvis was performed using the standard protocol following bolus administration of intravenous contrast. RADIATION DOSE REDUCTION: This exam was performed according to the departmental dose-optimization program which includes automated exposure control, adjustment of the mA and/or kV according to patient size and/or use of iterative reconstruction technique. CONTRAST:  OMNIPAQUE IOHEXOL 300 MG/ML  SOLN COMPARISON:  CT chest 09/25/2022, ultrasound abdomen 08/17/2016, ultrasound abdomen 12/23/2017 FINDINGS: Lower chest: Trace right pleural effusion.  Hepatobiliary: No focal liver abnormality. Status post cholecystectomy. No biliary dilatation. Pancreas: No focal lesion. Normal pancreatic contour. No surrounding inflammatory changes. No main pancreatic ductal dilatation. Spleen: Normal in size without focal abnormality. Adrenals/Urinary Tract: No adrenal nodule bilaterally. Bilateral kidneys enhance symmetrically. No hydronephrosis. No hydroureter. The urinary bladder is unremarkable. On delayed imaging, there is no urothelial wall thickening and there are no filling defects in the opacified portions of the bilateral collecting systems or ureters. Stomach/Bowel: Stomach is within normal limits. No evidence of bowel wall thickening or dilatation. Colonic diverticulosis. Unremarkable appendix. Vascular/Lymphatic: No abdominal aorta or iliac aneurysm. Severe atherosclerotic plaque of the aorta and its branches. No abdominal, pelvic, or  inguinal lymphadenopathy. Reproductive: Prostate is unremarkable. Other: No intraperitoneal free fluid. No intraperitoneal free gas. Interval increase in size of a perihepatic, now fully visualized (previously only partially visualized on CT chest 2021, 2022, 2023), large lobulated fluid density lesion within thick walls and multiple septation as well as several layering calcified stones (2:37). Finding is located along the retroperitoneum and perihepatic space with extension through the right lateral abdominal wall into the right abdominal musculature with largest component measuring up to (8 x 2 x 10 cm). Perihepatic component measures up to at least 13 cm in the craniocaudal dimension and approximately up to 8 x 3 cm on axial imaging. Musculoskeletal: No abdominal wall hernia or abnormality. No suspicious lytic or blastic osseous lesions. No acute displaced fracture. Multilevel degenerative changes of the spine. Mild retrolisthesis of L5 on S1 IMPRESSION: 1. Interval increase in size of a perihepatic multi septated abscess or less likely mass. Etiology likely due to walled-off dropped gallstones from cholecystectomy/history of perforated cholecystitis. Differential diagnosis does include a malignancy/metastasis. Finding is located along the retroperitoneum and perihepatic space with extension through the right lateral abdominal wall into the right abdominal musculature with largest component measuring up to (8 x 6 x 10 cm). Recommend surgical consultation. 2. Colonic diverticulosis with no acute diverticulitis. 3. Trace right pleural effusion. 4.  Aortic Atherosclerosis (ICD10-I70.0). Electronically Signed   By: Tish Frederickson M.D.   On: 03/09/2023 23:12    Microbiology: Results for orders placed or performed during the hospital encounter of 03/09/23  Culture, blood (Routine X 2) w Reflex to ID Panel     Status: None (Preliminary result)   Collection Time: 03/10/23 12:47 AM   Specimen: BLOOD  Result Value  Ref Range Status   Specimen Description BLOOD RIGHT HAND  Final   Special Requests   Final    BOTTLES DRAWN AEROBIC AND ANAEROBIC Blood Culture results may not be optimal due to an inadequate volume of blood received in culture bottles   Culture   Final    NO GROWTH 2 DAYS Performed at [facility], [address] [place], [place] [address]    Report Status PENDING  Incomplete  Culture, blood (Routine X 2) w Reflex to ID Panel     Status: None (Preliminary result)   Collection Time: 03/10/23 12:47 AM   Specimen: BLOOD  Result Value Ref Range Status   Specimen Description BLOOD RIGHT ARM  Final   Special Requests   Final    BOTTLES DRAWN AEROBIC AND ANAEROBIC Blood Culture results may not be optimal due to an inadequate volume of blood received in culture bottles   Culture   Final    NO GROWTH 2 DAYS Performed at [facility], [address] [place], [place] [address]    Report Status PENDING  Incomplete  MRSA culture     Status: None (Preliminary result)   Collection Time: 03/10/23  2:15 AM   Specimen: Nasal; Body Fluid  Result Value Ref Range Status   Specimen Description   Final    NASAL SWAB Performed at [facility], [address], [place], [place] [address]    Special Requests   Final    NONE Performed at [facility], [address] [place], [place] [address]    Culture   Final    CULTURE REINCUBATED FOR BETTER GROWTH Performed at [facility], [address] [place], [place] [address]    Report Status PENDING  Incomplete  Urine Culture (for pregnant, neutropenic or urologic patients or patients with an indwelling urinary catheter)     Status: None   Collection Time: 03/10/23  8:43 AM   Specimen: Urine, Clean Catch  Result Value Ref Range Status   Specimen Description   Final    URINE, CLEAN CATCH Performed at [facility], [address], [place], [place] [address]    Special Requests   Final     NONE Performed at [facility], [address] [place], [place] [address]    Culture   Final    NO GROWTH Performed at [facility], [address], [place], [place] [address]    Report Status 03/11/2023 FINAL  Final  Aerobic/Anaerobic Culture w Gram Stain (surgical/deep wound)     Status: None (Preliminary result)   Collection Time: 03/10/23 11:22 AM   Specimen: Abscess  Result Value Ref Range Status   Specimen Description   Final    ABSCESS Performed at [facility], [address] [place], [place] [address]    Special Requests   Final    NONE Performed at [facility], [address] [place], [place] [address]    Gram Stain   Final    RARE WBC PRESENT, PREDOMINANTLY PMN NO ORGANISMS SEEN    Culture   Final    NO GROWTH 2 DAYS Performed at [facility], [address] [place], [place] [address]    Report Status PENDING  Incomplete    Labs: CBC: Recent Labs  Lab 03/09/23 1645 03/10/23 0337 03/11/23 0444  WBC 14.0* 11.2* 8.5  NEUTROABS 10.7*  --   --   HGB 12.8* 11.6* 11.5*  HCT 39.4 34.3* 34.4*  MCV 85.7 82.9 81.9  PLT 484* 388 397   Basic Metabolic Panel: Recent Labs  Lab 03/09/23 1645 03/10/23 0337 03/11/23 0444  NA 134* 136 135  K 3.5 3.4* 3.4*  CL 102 105 102  CO2 23 24 23   GLUCOSE 141* 111* 113*  BUN 13 9 16   CREATININE 0.67 0.58* 0.56*  CALCIUM 8.8* 8.7* 8.4*   Liver Function Tests: Recent Labs  Lab 03/09/23 1645 03/10/23 0337  AST 18 15  ALT 10 9  ALKPHOS 86 78  BILITOT 0.3 0.4  PROT 7.6 6.9  ALBUMIN 3.6 3.4*   CBG: Recent Labs  Lab 03/10/23 1015 03/10/23 1146  GLUCAP 118* 120*    Discharge time spent: 35 minutes.  Signed: Marcelino Duster, MD [facility] 03/12/2023

## 2023-03-12 NOTE — Progress Notes (Signed)
Pt discharged home with spouse in stable condition. Discharge instructions given. Pt and spouse verbalized understanding. No immediate questions or concerns at this time. Discharged from unit via wheelchair.

## 2023-03-15 ENCOUNTER — Telehealth: Payer: Self-pay | Admitting: Family Medicine

## 2023-03-15 LAB — CULTURE, BLOOD (ROUTINE X 2)
Culture: NO GROWTH
Culture: NO GROWTH

## 2023-03-15 LAB — AEROBIC/ANAEROBIC CULTURE W GRAM STAIN (SURGICAL/DEEP WOUND): Culture: NO GROWTH

## 2023-03-15 NOTE — Telephone Encounter (Signed)
Called Pam gave her verbal orders. She verbalized understanding.  KP

## 2023-03-15 NOTE — Telephone Encounter (Signed)
Copied from CRM [id number]. Topic: Quick Communication - Home Health Verbal Orders >> Mar 15, 2023 12:02 PM Clide Dales wrote: Caller/Agency: [facility] Callback Number: [phone] Service Requested: Skilled Nursing Frequency: 1w9, 2 prn Any new concerns about the patient? Yes

## 2023-03-17 ENCOUNTER — Telehealth: Payer: Self-pay

## 2023-03-17 NOTE — Transitions of Care (Post Inpatient/ED Visit) (Signed)
03/17/2023  Name: Joel Flores MRN: [id number] DOB: Jul 03, 1950  Today's TOC FU Call Status: Today's TOC FU Call Status:: Successful TOC FU Call Completed TOC FU Call Complete Date: 03/17/23 Patient's Name and Date of Birth confirmed.  Transition Care Management Follow-up Telephone Call Date of Discharge: 03/12/23 Discharge Facility: [facility]) Type of Discharge: Inpatient Admission Primary Inpatient Discharge Diagnosis:: peri hepatic abcess How have you been since you were released from the hospital?: Better Any questions or concerns?: Yes Patient Questions/Concerns:: He had questions regardimg d/c summary, ABT Culture results, Home Health visits and automated calls Patient Questions/Concerns Addressed: Other: (reviewed new orders [facility] Nursing orders and visit schedule, Wound cultures results, discussed automated calls and SDOH questions)  Items Reviewed: Did you receive and understand the discharge instructions provided?: No (reviewed new orders Wound cultures results,) Medications obtained,verified, and reconciled?: Yes (Medications Reviewed) Any new allergies since your discharge?: No Dietary orders reviewed?: Yes Type of Diet Ordered:: Reg, Heart Healthy Do you have support at home?: Yes People in Home: spouse Name of Support/Comfort Primary Source: Joel Flores  Medications Reviewed Today: Medications Reviewed Today     Reviewed by Joel Barrios, RN (Registered Nurse) on 03/17/23 at 1547  Med List Status: <None>   Medication Order Taking? Sig Documenting Provider Last Dose Status Informant  acetaminophen (TYLENOL) 325 MG tablet [id number] Yes Take 325 mg by mouth daily as needed for moderate pain or headache. [provider] Taking Active Self  albuterol (VENTOLIN HFA) 108 (90 Base) MCG/ACT inhaler [id number] Yes Inhale into the lungs. Inhale 2 inhalations into the lungs 4 (four) times daily PRN [provider] Taking Active    amLODipine (NORVASC) 5 MG tablet [id number] Yes Take 5 mg by mouth daily. [provider] Taking Active   amoxicillin-clavulanate (AUGMENTIN) 500-125 MG tablet [id number] Yes Take 1 tablet by mouth every 12 (twelve) hours for 10 days. Joel Duster, MD Taking Active   azelastine (ASTELIN) 0.1 % nasal spray [id number] Yes Place 2 sprays into both nostrils 2 (two) times daily. Use in each nostril as directed Joel Limerick, MD Taking Active   cetirizine (ZYRTEC) 10 MG tablet [id number] Yes Take 1 tablet (10 mg total) by mouth daily. Joel Limerick, MD Taking Active   clopidogrel (PLAVIX) 75 MG tablet [id number] Yes Take 1 tablet (75 mg total) by mouth daily. Joel Limerick, MD Taking Active   fluticasone [facility]) 50 MCG/ACT nasal spray [id number] Yes SPRAY 2 SPRAYS INTO EACH NOSTRIL EVERY DAY Joel Limerick, MD Taking Active   ibuprofen (ADVIL,MOTRIN) 200 MG tablet [id number] Yes Take 400 mg by mouth every 8 (eight) hours as needed (for pain). [provider] Taking Active Self  levothyroxine (SYNTHROID) 112 MCG tablet [id number] Yes TAKE 1 TABLET BY MOUTH EVERY DAY Joel Limerick, MD Taking Active   losartan (COZAAR) 100 MG tablet [id number] Yes Take 100 mg by mouth daily. [provider] Taking Active   metFORMIN (GLUCOPHAGE) 500 MG tablet [id number] Yes TAKE 1 TABLET BY MOUTH EVERY DAY WITH BREAKFAST Joel Limerick, MD Taking Active   pantoprazole (PROTONIX) 40 MG tablet [id number] Yes Take 1 tablet (40 mg total) by mouth daily. Joel Limerick, MD Taking Active   rosuvastatin (CRESTOR) 40 MG tablet [id number] Yes Take 40 mg by mouth daily. [provider] Taking Active   sertraline (ZOLOFT) 100 MG tablet [id number] Yes Take 1 tablet (100 mg total) by mouth daily. Joel Limerick, MD Taking Active  Home Care and Equipment/Supplies: Were Home Health Services Ordered?: Yes Name of Home Health Agency:: [facility] Has Agency set up a time  to come to your home?: Yes First Home Health Visit Date: 03/14/23 Any new equipment or medical supplies ordered?: No  Functional Questionnaire: Do you need assistance with bathing/showering or dressing?: No Do you need assistance with meal preparation?: No Do you need assistance with eating?: No Do you have difficulty maintaining continence: No Do you need assistance with getting out of bed/getting out of a chair/moving?: No Do you have difficulty managing or taking your medications?: No  Follow up appointments reviewed: PCP Follow-up appointment confirmed?: Yes Date of PCP follow-up appointment?: 03/18/23 Follow-up Provider: Elizabeth Flores Specialist [facility] Follow-up appointment confirmed?: Yes Date of Specialist follow-up appointment?: 03/24/23 Follow-Up Specialty Provider:: Surgical Do you need transportation to your follow-up appointment?: No (Both patient amd spouse drive) Do you understand care options if your condition(s) worsen?: Yes-patient verbalized understanding  SDOH Interventions Today    Flowsheet Row Most Recent Value  SDOH Interventions   Food Insecurity Interventions Intervention Not Indicated  Housing Interventions Intervention Not Indicated  Transportation Interventions Patient Resources (Friends/Family), Intervention Not Indicated  Utilities Interventions Intervention Not Indicated     Based on current information and Insurance plan -Reviewed benefits available to patient, including details about eligibility options for care if any area of needs were identified.  Reviewed patients ability to access and / or navigating the benefits system.Marland KitchenHe stated he is fully capable of handling his care, they are financially stable and have no more questions or concerns r/t post hospital care .    Discussed [facility]  TOC program and weekly calls to patient to assess condition/status, medication management  and provide support/education as indicated . Patient/ Caregiver voiced  understanding and declined enrollment in the 30-day TOC Program.  He reports he is very annoyed with [facility] auto-mated phone system calls. They ask same questions and hacve called him several times 10/17 and 11/20 already, and there was the [facility] call today.   Patient educated on red flag s/s to watch for and was encouraged to report, any changes in baseline or  medication regimen,  changes in health status  /  well-being, safety concerns  or any new unmanaged side effects or symptoms not relieved with interventions  to PCP and / or the  [facility] team . Reviewed his wound culture as final report 11/18  "No Growth" . He will completed his oral  ABT x 10 days . Dressing change today with no s/s infection scant amt serous drainage per spouse.    The patient has been provided with contact information for the care management team and has been advised to call with any health related questions or concerns.    Susa Loffler , BSN, RN Care Management Coordinator [place]   [facility] [email or web address] Direct Dial: [phone]

## 2023-03-18 ENCOUNTER — Ambulatory Visit: Payer: Medicare PPO | Admitting: Family Medicine

## 2023-03-18 ENCOUNTER — Encounter: Payer: Self-pay | Admitting: Family Medicine

## 2023-03-18 VITALS — BP 112/68 | HR 106 | Ht 67.0 in | Wt 151.0 lb

## 2023-03-18 DIAGNOSIS — L02211 Cutaneous abscess of abdominal wall: Secondary | ICD-10-CM

## 2023-03-18 NOTE — Addendum Note (Signed)
Addended by: Duanne Limerick on: 03/18/2023 02:43 PM   Modules accepted: Level of Service

## 2023-03-18 NOTE — Progress Notes (Signed)
Date:  03/18/2023   Name:  Joel Flores   DOB:  March 11, 1951   MRN:  [id number]   Chief Complaint: Hospitalization Follow-up  Follow up Hospitalization  Patient was admitted to [facility] on 11/12 and discharged on 11/15. He was treated for perihepatic abscess. Treatment for this included I&D. Telephone follow up was done on 11/20 He reports excellent compliance with treatment. He reports this condition is improved.  ----------------------------------------------------------------------------------------- -       Lab Results  Component Value Date   NA 135 03/11/2023   K 3.4 (L) 03/11/2023   CO2 23 03/11/2023   GLUCOSE 113 (H) 03/11/2023   BUN 16 03/11/2023   CREATININE 0.56 (L) 03/11/2023   CALCIUM 8.4 (L) 03/11/2023   EGFR 96 05/06/2022   GFRNONAA >60 03/11/2023   Lab Results  Component Value Date   CHOL 121 05/06/2022   HDL 42 05/06/2022   LDLCALC 50 05/06/2022   TRIG 176 (H) 05/06/2022   CHOLHDL 3.7 06/17/2018   Lab Results  Component Value Date   TSH 1.612 11/27/2022   Lab Results  Component Value Date   HGBA1C 6.7 (H) 03/09/2023   Lab Results  Component Value Date   WBC 8.5 03/11/2023   HGB 11.5 (L) 03/11/2023   HCT 34.4 (L) 03/11/2023   MCV 81.9 03/11/2023   PLT 397 03/11/2023   Lab Results  Component Value Date   ALT 9 03/10/2023   AST 15 03/10/2023   ALKPHOS 78 03/10/2023   BILITOT 0.4 03/10/2023   No results found for: "25OHVITD2", "25OHVITD3", "VD25OH"   Review of Systems  Constitutional:  Negative for chills and fever.  HENT:  Negative for trouble swallowing.   Eyes:  Negative for visual disturbance.  Respiratory:  Negative for cough, chest tightness, shortness of breath and wheezing.   Cardiovascular: Negative.  Negative for chest pain, palpitations and leg swelling.  Gastrointestinal:  Negative for abdominal distention, abdominal pain, blood in stool and rectal pain.  Endocrine: Negative for cold intolerance, polydipsia and polyuria.   Genitourinary:  Negative for difficulty urinating.  Musculoskeletal:  Negative for arthralgias and gait problem.  Neurological:  Negative for dizziness.    Patient Active Problem List   Diagnosis Date Noted   Hepatic abscess 03/10/2023   Abdominal pain 03/10/2023   DM (diabetes mellitus) (HCC) 03/10/2023   Sepsis (HCC) 03/10/2023   Abdominal wall abscess 03/10/2023   Bilateral carotid artery stenosis 09/20/2019   Colon cancer screening    Polyp of sigmoid colon    SOBOE (shortness of breath on exertion) 07/30/2017   PVD (peripheral vascular disease) (HCC) 11/17/2016   Tobacco use disorder 11/17/2016   Reactive airway disease, mild intermittent, uncomplicated 08/12/2016   Allergic rhinitis due to pollen 08/13/2015   Nocturia 08/13/2015   Atherosclerosis of aorta (HCC) 02/12/2015   Essential hypertension 02/12/2015   Esophageal reflux 02/12/2015   Depression 02/12/2015   Adult hypothyroidism 02/12/2015   Chronic anxiety 02/12/2015   Hyperlipemia 02/12/2015    Allergies  Allergen Reactions   Sulfa Antibiotics Rash    Past Surgical History:  Procedure Laterality Date   CATARACT EXTRACTION W/PHACO Right 03/16/2018   Procedure: CATARACT EXTRACTION PHACO AND INTRAOCULAR LENS PLACEMENT (IOC);  Surgeon: Elliot Cousin, MD;  Location: [facility];  Service: Ophthalmology;  Laterality: Right;  [place]  01:05 CDE 11.85 Fluid pack lot # [id number]   CATARACT EXTRACTION W/PHACO Left 05/08/2020   Procedure: CATARACT EXTRACTION PHACO AND INTRAOCULAR LENS PLACEMENT (IOC) LEFT 7.32 00:54.7 13.4%;  Surgeon: Lockie Mola, MD;  Location: [facility];  Service: Ophthalmology;  Laterality: Left;   CHOLECYSTECTOMY     COLONOSCOPY  2015   Dr Servando Snare- cleared for 5 years    COLONOSCOPY WITH PROPOFOL N/A 01/17/2018   Procedure: COLONOSCOPY WITH PROPOFOL;  Surgeon: Midge Minium, MD;  Location: [facility];  Service: Endoscopy;  Laterality: N/A;  requests early   HERNIA REPAIR  July  2019   INSERTION OF MESH N/A 08/31/2017   Procedure: INSERTION OF MESH;  Surgeon: Ancil Linsey, MD;  Location: [facility];  Service: General;  Laterality: N/A;   IRRIGATION AND DEBRIDEMENT ABSCESS N/A 03/10/2023   Procedure: IRRIGATION AND DEBRIDEMENT ABDOMINAL WALL ABSCESS;  Surgeon: Leafy Ro, MD;  Location: [facility];  Service: General;  Laterality: N/A;   POLYPECTOMY  01/17/2018   Procedure: POLYPECTOMY;  Surgeon: Midge Minium, MD;  Location: [facility];  Service: Endoscopy;;   UMBILICAL HERNIA REPAIR N/A 08/31/2017   Procedure: HERNIA REPAIR UMBILICAL ADULT;  Surgeon: Ancil Linsey, MD;  Location: [facility];  Service: General;  Laterality: N/A;   VEIN SURGERY     2 stents in R) leg    Social History   Tobacco Use   Smoking status: Every Day    Current packs/day: 1.50    Average packs/day: 1.5 packs/day for 50.0 years (75.0 ttl pk-yrs)    Types: Cigarettes   Smokeless tobacco: Never  Vaping Use   Vaping status: Never Used  Substance Use Topics   Alcohol use: Yes    Comment: once a month   Drug use: No     Medication list has been reviewed and updated.  Current Meds  Medication Sig   acetaminophen (TYLENOL) 325 MG tablet Take 325 mg by mouth daily as needed for moderate pain or headache.   albuterol (VENTOLIN HFA) 108 (90 Base) MCG/ACT inhaler Inhale into the lungs. Inhale 2 inhalations into the lungs 4 (four) times daily PRN   amLODipine (NORVASC) 5 MG tablet Take 5 mg by mouth daily.   amoxicillin-clavulanate (AUGMENTIN) 500-125 MG tablet Take 1 tablet by mouth every 12 (twelve) hours for 10 days.   azelastine (ASTELIN) 0.1 % nasal spray Place 2 sprays into both nostrils 2 (two) times daily. Use in each nostril as directed   cetirizine (ZYRTEC) 10 MG tablet Take 1 tablet (10 mg total) by mouth daily.   clopidogrel (PLAVIX) 75 MG tablet Take 1 tablet (75 mg total) by mouth daily.   fluticasone (FLONASE) 50 MCG/ACT nasal spray SPRAY 2 SPRAYS INTO EACH  NOSTRIL EVERY DAY   ibuprofen (ADVIL,MOTRIN) 200 MG tablet Take 400 mg by mouth every 8 (eight) hours as needed (for pain).   levothyroxine (SYNTHROID) 112 MCG tablet TAKE 1 TABLET BY MOUTH EVERY DAY   losartan (COZAAR) 100 MG tablet Take 100 mg by mouth daily.   metFORMIN (GLUCOPHAGE) 500 MG tablet TAKE 1 TABLET BY MOUTH EVERY DAY WITH BREAKFAST   pantoprazole (PROTONIX) 40 MG tablet Take 1 tablet (40 mg total) by mouth daily.   rosuvastatin (CRESTOR) 40 MG tablet Take 40 mg by mouth daily.   sertraline (ZOLOFT) 100 MG tablet Take 1 tablet (100 mg total) by mouth daily.       03/09/2023    3:13 PM 11/27/2022    9:46 AM 10/27/2022    3:10 PM 09/04/2022    8:34 AM  GAD 7 : Generalized Anxiety Score  Nervous, Anxious, on Edge 1 0 0 0  Control/stop worrying 1  0 0 0  Worry too much - different things 1 0 0 0  Trouble relaxing 0 0 0 0  Restless 0 0 0 0  Easily annoyed or irritable 0 0 0 0  Afraid - awful might happen 0 0 0 0  Total GAD 7 Score 3 0 0 0  Anxiety Difficulty Not difficult at all Not difficult at all Not difficult at all Not difficult at all       03/09/2023    3:13 PM 11/27/2022    9:46 AM 10/27/2022    3:10 PM  Depression screen PHQ 2/9  Decreased Interest 0 0 0  Down, Depressed, Hopeless 0 0 0  PHQ - 2 Score 0 0 0  Altered sleeping 0 0 0  Tired, decreased energy 0 0 0  Change in appetite 0 0 0  Feeling bad or failure about yourself  0 0 0  Trouble concentrating 0 0 0  Moving slowly or fidgety/restless 0 0 0  Suicidal thoughts 0 0 0  PHQ-9 Score 0 0 0  Difficult doing work/chores Not difficult at all Not difficult at all Not difficult at all    BP Readings from Last 3 Encounters:  03/18/23 112/68  03/12/23 (!) 147/95  03/09/23 112/72    Physical Exam Vitals and nursing note reviewed.  HENT:     Head: Normocephalic.     Right Ear: Tympanic membrane and external ear normal.     Left Ear: Tympanic membrane and external ear normal.     Nose: Nose normal.      Mouth/Throat:     Mouth: Mucous membranes are moist.     Pharynx: No oropharyngeal exudate or posterior oropharyngeal erythema.  Eyes:     General: No scleral icterus.       Right eye: No discharge.        Left eye: No discharge.     Conjunctiva/sclera: Conjunctivae normal.     Pupils: Pupils are equal, round, and reactive to light.  Neck:     Thyroid: No thyromegaly.     Vascular: No JVD.     Trachea: No tracheal deviation.  Cardiovascular:     Rate and Rhythm: Normal rate and regular rhythm.     Heart sounds: Normal heart sounds. No murmur heard.    No friction rub. No gallop.  Pulmonary:     Effort: No respiratory distress.     Breath sounds: Normal breath sounds. No wheezing, rhonchi or rales.  Abdominal:     General: Bowel sounds are normal.     Palpations: Abdomen is soft. There is no mass.     Tenderness: There is no abdominal tenderness. There is no guarding or rebound.  Musculoskeletal:        General: No tenderness. Normal range of motion.     Cervical back: Normal range of motion and neck supple.  Lymphadenopathy:     Cervical: No cervical adenopathy.  Skin:    General: Skin is warm.     Findings: No rash.  Neurological:     Mental Status: He is alert and oriented to person, place, and time.     Cranial Nerves: No cranial nerve deficit.     Deep Tendon Reflexes: Reflexes are normal and symmetric.     Wt Readings from Last 3 Encounters:  03/18/23 151 lb (68.5 kg)  03/10/23 151 lb 0.2 oz (68.5 kg)  03/09/23 155 lb (70.3 kg)    BP 112/68   Pulse (!) 106  Ht 5\' 7"  (1.702 m)   Wt 151 lb (68.5 kg)   SpO2 98%   BMI 23.65 kg/m   [facility]ICINE AT [facility]                                   Transitional [facility]   [facility] Discharge Acute Issues Care Follow Up                                                                        Patient Demographics  Joel Flores, is a 72  y.o. male  DOB Nov 07, 1950  MRN [id number].  Primary MD  Duanne Limerick, MD   Reason for TCC follow Up -patient presents for follow-up of incision and drainage of perihepatic abscess.  Patient is doing well without abdominal pain fever chills and is changing the dressing on a daily basis with packing with the assistance of his wife.  We will continue in this direction although I have asked him to call home health to see if there is supposed to be, now and a supervising.  Patient has an upcoming appointment with Dr. Elenor Legato and will attend this.   Past Medical History:  Diagnosis Date   Allergy 1990   Anxiety    PANIC ATTACKS   Arthritis    right shoulder   Atherosclerosis of aorta (HCC) 02/12/2015   Cataract    COPD (chronic obstructive pulmonary disease) (HCC)    Cough    Depression    DM (diabetes mellitus) (HCC) 03/10/2023   Environmental and seasonal allergies    Essential hypertension 02/12/2015   Family history of adverse reaction to anesthesia    Father - 62 - MI, then stroke after Prostate surgery   GERD (gastroesophageal reflux disease)    History of hiatal hernia    Hyperlipidemia    Hypertension    Hypothyroidism    Incisional hernia, without obstruction or gangrene    PVD (peripheral vascular disease) (HCC) 11/17/2016   Thyroid disease    Venous insufficiency    Vertigo    can happen daily   Wears hearing aid in both ears    has, does not wear    Past Surgical History:  Procedure Laterality Date   CATARACT EXTRACTION W/PHACO Right 03/16/2018   Procedure: CATARACT EXTRACTION PHACO AND INTRAOCULAR LENS PLACEMENT (IOC);  Surgeon: Elliot Cousin, MD;  Location: [facility];  Service: Ophthalmology;  Laterality: Right;  [place]  01:05 CDE 11.85 Fluid pack lot # [id number]   CATARACT EXTRACTION W/PHACO Left 05/08/2020   Procedure: CATARACT EXTRACTION PHACO AND INTRAOCULAR LENS PLACEMENT (IOC) LEFT 7.32 00:54.7 13.4%;  Surgeon: Lockie Mola, MD;  Location: [facility];  Service: Ophthalmology;  Laterality: Left;   CHOLECYSTECTOMY     COLONOSCOPY  2015   Dr Servando Snare- cleared for 5 years    COLONOSCOPY WITH PROPOFOL N/A 01/17/2018   Procedure: COLONOSCOPY WITH PROPOFOL;  Surgeon: Midge Minium, MD;  Location: [facility];  Service: Endoscopy;  Laterality: N/A;  requests early   HERNIA REPAIR  July 2019   INSERTION OF MESH  N/A 08/31/2017   Procedure: INSERTION OF MESH;  Surgeon: Ancil Linsey, MD;  Location: [facility];  Service: General;  Laterality: N/A;   IRRIGATION AND DEBRIDEMENT ABSCESS N/A 03/10/2023   Procedure: IRRIGATION AND DEBRIDEMENT ABDOMINAL WALL ABSCESS;  Surgeon: Leafy Ro, MD;  Location: [facility];  Service: General;  Laterality: N/A;   POLYPECTOMY  01/17/2018   Procedure: POLYPECTOMY;  Surgeon: Midge Minium, MD;  Location: [facility];  Service: Endoscopy;;   UMBILICAL HERNIA REPAIR N/A 08/31/2017   Procedure: HERNIA REPAIR UMBILICAL ADULT;  Surgeon: Ancil Linsey, MD;  Location: [facility];  Service: General;  Laterality: N/A;   VEIN SURGERY     2 stents in R) leg  Recent HPI and Hospital course patient had abdominal discomfort with gradual increase in size of a what was thought to be a hernia.  This turned out to be a perihepatic abscess that was gradually feeling and patient was hospitalized with possible sepsis and procedure was accomplished.  Post Hospital acute care issue to be followed in clinic: There is no specific other than to inquire if patient is having difficulty with dressing changes and which is being accomplished without complications and will continue to do so.  Patient is currently taking his antibiotics on a regular basis and will return on an as-needed basis.        Subjective:   Joel Flores today has, No headache, No chest pain, No abdominal pain - No Nausea, No new weakness tingling or numbness, No Cough - SOB.  No fever no chills no abdominal pain no drainage other than what is to  be expected as patient is being packed and dressed on a regular basis without closure.  Assessment & Plan    There are no diagnoses linked to this encounter.   Reason for frequent admissions/ER visits none at this time.      Objective:   Vitals:   03/18/23 1351  BP: 112/68  Pulse: (!) 106  SpO2: 98%  Weight: 151 lb (68.5 kg)  Height: 5\' 7"  (1.702 m)    Wt Readings from Last 3 Encounters:  03/18/23 151 lb (68.5 kg)  03/10/23 151 lb 0.2 oz (68.5 kg)  03/09/23 155 lb (70.3 kg)    Allergies as of 03/18/2023       Reactions   Sulfa Antibiotics Rash        Medication List        Accurate as of March 18, 2023  2:26 PM. If you have any questions, ask your nurse or doctor.          acetaminophen 325 MG tablet Commonly known as: TYLENOL Take 325 mg by mouth daily as needed for moderate pain or headache.   albuterol 108 (90 Base) MCG/ACT inhaler Commonly known as: VENTOLIN HFA Inhale into the lungs. Inhale 2 inhalations into the lungs 4 (four) times daily PRN   amLODipine 5 MG tablet Commonly known as: NORVASC Take 5 mg by mouth daily.   amoxicillin-clavulanate 500-125 MG tablet Commonly known as: Augmentin Take 1 tablet by mouth every 12 (twelve) hours for 10 days.   azelastine 0.1 % nasal spray Commonly known as: ASTELIN Place 2 sprays into both nostrils 2 (two) times daily. Use in each nostril as directed   cetirizine 10 MG tablet Commonly known as: ZYRTEC Take 1 tablet (10 mg total) by mouth daily.   clopidogrel 75 MG tablet Commonly known as: PLAVIX Take 1 tablet (75 mg total) by mouth daily.  fluticasone 50 MCG/ACT nasal spray Commonly known as: FLONASE SPRAY 2 SPRAYS INTO EACH NOSTRIL EVERY DAY   ibuprofen 200 MG tablet Commonly known as: ADVIL Take 400 mg by mouth every 8 (eight) hours as needed (for pain).   levothyroxine 112 MCG tablet Commonly known as: SYNTHROID TAKE 1 TABLET BY MOUTH EVERY DAY   losartan 100 MG  tablet Commonly known as: COZAAR Take 100 mg by mouth daily.   metFORMIN 500 MG tablet Commonly known as: GLUCOPHAGE TAKE 1 TABLET BY MOUTH EVERY DAY WITH BREAKFAST   pantoprazole 40 MG tablet Commonly known as: PROTONIX Take 1 tablet (40 mg total) by mouth daily.   rosuvastatin 40 MG tablet Commonly known as: CRESTOR Take 40 mg by mouth daily.   sertraline 100 MG tablet Commonly known as: ZOLOFT Take 1 tablet (100 mg total) by mouth daily.         Physical Exam: Constitutional: Patient appears well-developed and well-nourished. Not in obvious distress. HENT: Normocephalic, atraumatic, External right and left ear normal. Oropharynx is clear and moist.  Eyes: Conjunctivae and EOM are normal. PERRLA, no scleral icterus. Neck: Normal ROM. Neck supple. No JVD. No tracheal deviation. No thyromegaly. CVS: RRR, S1/S2 +, no murmurs, no gallops, no carotid bruit.  Pulmonary: Effort and breath sounds normal, no stridor, rhonchi, wheezes, rales.  Abdominal: Soft. BS +, no distension, tenderness, rebound or guarding.  Musculoskeletal: Normal range of motion. No edema and no tenderness.  Lymphadenopathy: No lymphadenopathy noted, cervical, inguinal or axillary Neuro: Alert. Normal reflexes, muscle tone coordination. No cranial nerve deficit. Skin: Skin is warm and dry. No rash noted. Not diaphoretic. No erythema. No pallor. Psychiatric: Normal mood and affect. Behavior, judgment, thought content normal.   Data Review   Micro Results Recent Results (from the past 240 hour(s))  Culture, blood (Routine X 2) w Reflex to ID Panel     Status: None   Collection Time: 03/10/23 12:47 AM   Specimen: BLOOD  Result Value Ref Range Status   Specimen Description BLOOD RIGHT HAND  Final   Special Requests   Final    BOTTLES DRAWN AEROBIC AND ANAEROBIC Blood Culture results may not be optimal due to an inadequate volume of blood received in culture bottles   Culture   Final    NO GROWTH 5  DAYS Performed at [facility], [address] [place], [place] [address]    Report Status 03/15/2023 FINAL  Final  Culture, blood (Routine X 2) w Reflex to ID Panel     Status: None   Collection Time: 03/10/23 12:47 AM   Specimen: BLOOD  Result Value Ref Range Status   Specimen Description BLOOD RIGHT ARM  Final   Special Requests   Final    BOTTLES DRAWN AEROBIC AND ANAEROBIC Blood Culture results may not be optimal due to an inadequate volume of blood received in culture bottles   Culture   Final    NO GROWTH 5 DAYS Performed at [facility], [address] [place], [place] [address]    Report Status 03/15/2023 FINAL  Final  MRSA culture     Status: None   Collection Time: 03/10/23  2:15 AM   Specimen: Nasal; Body Fluid  Result Value Ref Range Status   Specimen Description NASAL SWAB  Final   Special Requests NONE  Final   Culture NO MRSA DETECTED  Final   Report Status 03/12/2023 FINAL  Final  Urine Culture (for pregnant, neutropenic or urologic patients or  patients with an indwelling urinary catheter)     Status: None   Collection Time: 03/10/23  8:43 AM   Specimen: Urine, Clean Catch  Result Value Ref Range Status   Specimen Description   Final    URINE, CLEAN CATCH Performed at [facility], [address], [place], [place] [address]    Special Requests   Final    NONE Performed at [facility], [address], [place], [place] [address]    Culture   Final    NO GROWTH Performed at [facility], [address] [place], [place] [address]    Report Status 03/11/2023 FINAL  Final  Aerobic/Anaerobic Culture w Gram Stain (surgical/deep wound)     Status: None   Collection Time: 03/10/23 11:22 AM   Specimen: Abscess  Result Value Ref Range Status   Specimen Description   Final    ABSCESS Performed at [facility], [address] [place], [place] [address]    Special Requests   Final    NONE Performed at  [facility], [address] [place], [place] [address]    Gram Stain   Final    RARE WBC PRESENT, PREDOMINANTLY PMN NO ORGANISMS SEEN    Culture   Final    No growth aerobically or anaerobically. Performed at [facility], [address] [place], [place] [address]    Report Status 03/15/2023 FINAL  Final     CBC No results for input(s): "WBC", "HGB", "HCT", "PLT", "MCV", "MCH", "MCHC", "RDW", "LYMPHSABS", "MONOABS", "EOSABS", "BASOSABS", "BANDABS" in the last 168 hours.  Invalid input(s): "NEUTRABS", "BANDSABD"  Chemistries  No results for input(s): "NA", "K", "CL", "CO2", "GLUCOSE", "BUN", "CREATININE", "CALCIUM", "MG", "AST", "ALT", "ALKPHOS", "BILITOT" in the last 168 hours.  Invalid input(s): "GFRCGP" ------------------------------------------------------------------------------------------------------------------ estimated creatinine clearance is 78 mL/min (A) (by C-G formula based on SCr of 0.56 mg/dL (L)). ------------------------------------------------------------------------------------------------------------------ No results for input(s): "HGBA1C" in the last 72 hours. ------------------------------------------------------------------------------------------------------------------ No results for input(s): "CHOL", "HDL", "LDLCALC", "TRIG", "CHOLHDL", "LDLDIRECT" in the last 72 hours. ------------------------------------------------------------------------------------------------------------------ No results for input(s): "TSH", "T4TOTAL", "T3FREE", "THYROIDAB" in the last 72 hours.  Invalid input(s): "FREET3" ------------------------------------------------------------------------------------------------------------------ No results for input(s): "VITAMINB12", "FOLATE", "FERRITIN", "TIBC", "IRON", "RETICCTPCT" in the last 72 hours.  Coagulation profile No results for input(s): "INR", "PROTIME" in the last 168 hours.  No results for input(s):  "DDIMER" in the last 72 hours.  Cardiac Enzymes No results for input(s): "CKMB", "TROPONINI", "MYOGLOBIN" in the last 168 hours.  Invalid input(s): "CK" ------------------------------------------------------------------------------------------------------------------ Invalid input(s): "POCBNP" Time spent with patient 30 minutes There are no diagnoses linked to this encounter.  1. Abdominal wall abscess Patient was follow-up for an abdominal wall abscess from prior surgery for cholelithiasis.  Patient is currently doing well status post incision and drainage.  Patient has daily dressing changes as assisted by his wife with packing and is doing well with no fever chills abdominal pain and minimal drainage as expected.  Will continue to follow-up and I have instructed patient to call home health to see if this needs to be further supervised as that they have not been back since initial encounter.   Elizabeth Sauer M.D on 03/18/2023 at 2:26 PM   Disclaimer: This note may have been dictated with voice recognition software. Similar sounding words can inadvertently be transcribed and this note may contain transcription errors which may not have been corrected upon publication of note.      Elizabeth Sauer, MD

## 2023-03-22 DIAGNOSIS — C4359 Malignant melanoma of other part of trunk: Secondary | ICD-10-CM | POA: Diagnosis not present

## 2023-03-24 ENCOUNTER — Ambulatory Visit: Payer: Medicare PPO | Admitting: Surgery

## 2023-03-24 ENCOUNTER — Encounter: Payer: Self-pay | Admitting: Surgery

## 2023-03-24 VITALS — BP 124/80 | HR 120 | Temp 98.2°F | Ht 68.0 in | Wt 152.2 lb

## 2023-03-24 DIAGNOSIS — L02211 Cutaneous abscess of abdominal wall: Secondary | ICD-10-CM

## 2023-03-24 DIAGNOSIS — Z09 Encounter for follow-up examination after completed treatment for conditions other than malignant neoplasm: Secondary | ICD-10-CM

## 2023-03-24 NOTE — Progress Notes (Signed)
Outpatient Surgical Follow Up  03/24/2023  Joel Flores is an 71 y.o. male.   Chief Complaint  Patient presents with   Routine Post Op    Abdominal wall abscess     HPI: s/p abd wall abscess, doing very well, no fevers or chills. He continues to improve   Past Medical History:  Diagnosis Date   Allergy 1990   Anxiety    PANIC ATTACKS   Arthritis    right shoulder   Atherosclerosis of aorta (HCC) 02/12/2015   Cataract    COPD (chronic obstructive pulmonary disease) (HCC)    Cough    Depression    DM (diabetes mellitus) (HCC) 03/10/2023   Environmental and seasonal allergies    Essential hypertension 02/12/2015   Family history of adverse reaction to anesthesia    Father - 38 - MI, then stroke after Prostate surgery   GERD (gastroesophageal reflux disease)    History of hiatal hernia    Hyperlipidemia    Hypertension    Hypothyroidism    Incisional hernia, without obstruction or gangrene    PVD (peripheral vascular disease) (HCC) 11/17/2016   Thyroid disease    Venous insufficiency    Vertigo    can happen daily   Wears hearing aid in both ears    has, does not wear    Past Surgical History:  Procedure Laterality Date   CATARACT EXTRACTION W/PHACO Right 03/16/2018   Procedure: CATARACT EXTRACTION PHACO AND INTRAOCULAR LENS PLACEMENT (IOC);  Surgeon: Elliot Cousin, MD;  Location: [facility];  Service: Ophthalmology;  Laterality: Right;  [place]  01:05 CDE 11.85 Fluid pack lot # [id number]   CATARACT EXTRACTION W/PHACO Left 05/08/2020   Procedure: CATARACT EXTRACTION PHACO AND INTRAOCULAR LENS PLACEMENT (IOC) LEFT 7.32 00:54.7 13.4%;  Surgeon: Lockie Mola, MD;  Location: [facility];  Service: Ophthalmology;  Laterality: Left;   CHOLECYSTECTOMY     COLONOSCOPY  2015   Dr Servando Snare- cleared for 5 years    COLONOSCOPY WITH PROPOFOL N/A 01/17/2018   Procedure: COLONOSCOPY WITH PROPOFOL;  Surgeon: Midge Minium, MD;  Location: [facility];  Service:  Endoscopy;  Laterality: N/A;  requests early   HERNIA REPAIR  July 2019   INSERTION OF MESH N/A 08/31/2017   Procedure: INSERTION OF MESH;  Surgeon: Ancil Linsey, MD;  Location: [facility];  Service: General;  Laterality: N/A;   IRRIGATION AND DEBRIDEMENT ABSCESS N/A 03/10/2023   Procedure: IRRIGATION AND DEBRIDEMENT ABDOMINAL WALL ABSCESS;  Surgeon: Leafy Ro, MD;  Location: [facility];  Service: General;  Laterality: N/A;   POLYPECTOMY  01/17/2018   Procedure: POLYPECTOMY;  Surgeon: Midge Minium, MD;  Location: [facility];  Service: Endoscopy;;   UMBILICAL HERNIA REPAIR N/A 08/31/2017   Procedure: HERNIA REPAIR UMBILICAL ADULT;  Surgeon: Ancil Linsey, MD;  Location: [facility];  Service: General;  Laterality: N/A;   VEIN SURGERY     2 stents in R) leg    Family History  Problem Relation Age of Onset   Diabetes Mother    Cancer Father    Heart disease Father    Stroke Father     Social History:  reports that he has been smoking cigarettes. He has a 75 pack-year smoking history. He has never used smokeless tobacco. He reports current alcohol use. He reports that he does not use drugs.  Allergies:  Allergies  Allergen Reactions   Sulfa Antibiotics Rash    Medications reviewed.    ROS Full ROS  performed and is otherwise negative other than what is stated in HPI   BP 124/80   Pulse (!) 120   Temp 98.2 F (36.8 C)   Ht 5\' 8"  (1.727 m)   Wt 152 lb 3.2 oz (69 kg)   SpO2 99%   BMI 23.14 kg/m   Physical Exam NAD alert Abd: soft, nt, ruq wound 2x1 cm w good granulation, 5 mm depth, no un drained posckets     Assessment/Plan:  Doing well after w/d abd wall and perihepatic abscess RTC 3-4 weeks Continue dressing care  [name], MD FACS General Surgeon

## 2023-03-24 NOTE — Patient Instructions (Signed)

## 2023-03-29 ENCOUNTER — Encounter: Payer: Medicare PPO | Admitting: Surgery

## 2023-04-05 ENCOUNTER — Encounter: Payer: Self-pay | Admitting: Family Medicine

## 2023-04-05 ENCOUNTER — Ambulatory Visit: Payer: Medicare PPO | Admitting: Family Medicine

## 2023-04-05 VITALS — BP 122/64 | HR 88 | Ht 68.0 in | Wt 159.0 lb

## 2023-04-05 DIAGNOSIS — I700 Atherosclerosis of aorta: Secondary | ICD-10-CM

## 2023-04-05 DIAGNOSIS — J301 Allergic rhinitis due to pollen: Secondary | ICD-10-CM | POA: Diagnosis not present

## 2023-04-05 DIAGNOSIS — K219 Gastro-esophageal reflux disease without esophagitis: Secondary | ICD-10-CM

## 2023-04-05 DIAGNOSIS — F419 Anxiety disorder, unspecified: Secondary | ICD-10-CM | POA: Diagnosis not present

## 2023-04-05 DIAGNOSIS — E039 Hypothyroidism, unspecified: Secondary | ICD-10-CM

## 2023-04-05 DIAGNOSIS — E785 Hyperlipidemia, unspecified: Secondary | ICD-10-CM

## 2023-04-05 DIAGNOSIS — E119 Type 2 diabetes mellitus without complications: Secondary | ICD-10-CM

## 2023-04-05 DIAGNOSIS — Z7984 Long term (current) use of oral hypoglycemic drugs: Secondary | ICD-10-CM

## 2023-04-05 DIAGNOSIS — F3342 Major depressive disorder, recurrent, in full remission: Secondary | ICD-10-CM | POA: Diagnosis not present

## 2023-04-05 MED ORDER — FLUTICASONE PROPIONATE 50 MCG/ACT NA SUSP: NASAL | 11 refills | Status: AC

## 2023-04-05 MED ORDER — SERTRALINE HCL 100 MG PO TABS: 100.0000 mg | ORAL_TABLET | Freq: Every day | ORAL | 1 refills | Status: AC

## 2023-04-05 MED ORDER — METFORMIN HCL 500 MG PO TABS
ORAL_TABLET | ORAL | 1 refills | Status: AC
Start: 2023-04-05 — End: ?

## 2023-04-05 MED ORDER — CLOPIDOGREL BISULFATE 75 MG PO TABS: 75.0000 mg | ORAL_TABLET | Freq: Every day | ORAL | 1 refills | Status: AC

## 2023-04-05 MED ORDER — LEVOTHYROXINE SODIUM 112 MCG PO TABS: 112.0000 ug | ORAL_TABLET | Freq: Every day | ORAL | 1 refills | Status: AC

## 2023-04-05 MED ORDER — ROSUVASTATIN CALCIUM 40 MG PO TABS: 40.0000 mg | ORAL_TABLET | Freq: Every day | ORAL | 1 refills | Status: AC

## 2023-04-05 MED ORDER — PANTOPRAZOLE SODIUM 40 MG PO TBEC: 40.0000 mg | DELAYED_RELEASE_TABLET | Freq: Every day | ORAL | 1 refills | Status: DC

## 2023-04-05 NOTE — Patient Instructions (Signed)

## 2023-04-05 NOTE — Progress Notes (Signed)
Date:  04/05/2023   Name:  Joel Flores   DOB:  11-Mar-1951   MRN:  [id number]   Chief Complaint: Hypothyroidism and Diabetes  Diabetes He presents for his follow-up diabetic visit. He has type 2 diabetes mellitus. His disease course has been stable. Hypoglycemia symptoms include nervousness/anxiousness. Pertinent negatives for hypoglycemia include no dizziness or headaches. There are no diabetic associated symptoms. Pertinent negatives for diabetes include no blurred vision, no chest pain, no fatigue, no foot paresthesias, no foot ulcerations, no polydipsia, no polyphagia, no polyuria, no visual change, no weakness and no weight loss. There are no hypoglycemic complications. Symptoms are stable. Diabetic complications include heart disease. Risk factors for coronary artery disease include dyslipidemia. Current diabetic treatment includes oral agent (monotherapy). He is following a generally healthy diet. Meal planning includes avoidance of concentrated sweets and carbohydrate counting.  Thyroid Problem Presents for follow-up visit. Symptoms include anxiety and depressed mood. Patient reports no cold intolerance, constipation, diaphoresis, diarrhea, dry skin, fatigue, hair loss, heat intolerance, hoarse voice, leg swelling, palpitations, visual change, weight gain or weight loss. The symptoms have been stable.    Lab Results  Component Value Date   NA 135 03/11/2023   K 3.4 (L) 03/11/2023   CO2 23 03/11/2023   GLUCOSE 113 (H) 03/11/2023   BUN 16 03/11/2023   CREATININE 0.56 (L) 03/11/2023   CALCIUM 8.4 (L) 03/11/2023   EGFR 96 05/06/2022   GFRNONAA >60 03/11/2023   Lab Results  Component Value Date   CHOL 121 05/06/2022   HDL 42 05/06/2022   LDLCALC 50 05/06/2022   TRIG 176 (H) 05/06/2022   CHOLHDL 3.7 06/17/2018   Lab Results  Component Value Date   TSH 1.612 11/27/2022   Lab Results  Component Value Date   HGBA1C 6.7 (H) 03/09/2023   Lab Results  Component Value Date    WBC 8.5 03/11/2023   HGB 11.5 (L) 03/11/2023   HCT 34.4 (L) 03/11/2023   MCV 81.9 03/11/2023   PLT 397 03/11/2023   Lab Results  Component Value Date   ALT 9 03/10/2023   AST 15 03/10/2023   ALKPHOS 78 03/10/2023   BILITOT 0.4 03/10/2023   No results found for: "25OHVITD2", "25OHVITD3", "VD25OH"   Review of Systems  Constitutional:  Negative for chills, diaphoresis, fatigue, fever, weight gain and weight loss.  HENT:  Negative for drooling, ear discharge, ear pain, hoarse voice and sore throat.   Eyes:  Negative for blurred vision.  Respiratory:  Negative for cough, shortness of breath and wheezing.   Cardiovascular:  Negative for chest pain, palpitations and leg swelling.  Gastrointestinal:  Negative for abdominal pain, blood in stool, constipation, diarrhea and nausea.  Endocrine: Negative for cold intolerance, heat intolerance, polydipsia, polyphagia and polyuria.  Genitourinary:  Negative for dysuria, frequency, hematuria and urgency.  Musculoskeletal:  Negative for back pain, myalgias and neck pain.  Skin:  Negative for rash.  Allergic/Immunologic: Negative for environmental allergies.  Neurological:  Negative for dizziness, weakness and headaches.  Hematological:  Does not bruise/bleed easily.  Psychiatric/Behavioral:  Negative for suicidal ideas. The patient is nervous/anxious.     Patient Active Problem List   Diagnosis Date Noted   Hepatic abscess 03/10/2023   Abdominal pain 03/10/2023   DM (diabetes mellitus) (HCC) 03/10/2023   Sepsis (HCC) 03/10/2023   Abdominal wall abscess 03/10/2023   Bilateral carotid artery stenosis 09/20/2019   Colon cancer screening    Polyp of sigmoid colon    SOBOE (  shortness of breath on exertion) 07/30/2017   PVD (peripheral vascular disease) (HCC) 11/17/2016   Tobacco use disorder 11/17/2016   Reactive airway disease, mild intermittent, uncomplicated 08/12/2016   Allergic rhinitis due to pollen 08/13/2015   Nocturia 08/13/2015    Atherosclerosis of aorta (HCC) 02/12/2015   Essential hypertension 02/12/2015   Esophageal reflux 02/12/2015   Depression 02/12/2015   Adult hypothyroidism 02/12/2015   Chronic anxiety 02/12/2015   Hyperlipemia 02/12/2015    Allergies  Allergen Reactions   Sulfa Antibiotics Rash    Past Surgical History:  Procedure Laterality Date   CATARACT EXTRACTION W/PHACO Right 03/16/2018   Procedure: CATARACT EXTRACTION PHACO AND INTRAOCULAR LENS PLACEMENT (IOC);  Surgeon: Elliot Cousin, MD;  Location: [facility];  Service: Ophthalmology;  Laterality: Right;  [place]  01:05 CDE 11.85 Fluid pack lot # [id number]   CATARACT EXTRACTION W/PHACO Left 05/08/2020   Procedure: CATARACT EXTRACTION PHACO AND INTRAOCULAR LENS PLACEMENT (IOC) LEFT 7.32 00:54.7 13.4%;  Surgeon: Lockie Mola, MD;  Location: [facility];  Service: Ophthalmology;  Laterality: Left;   CHOLECYSTECTOMY     COLONOSCOPY  2015   Dr Servando Snare- cleared for 5 years    COLONOSCOPY WITH PROPOFOL N/A 01/17/2018   Procedure: COLONOSCOPY WITH PROPOFOL;  Surgeon: Midge Minium, MD;  Location: [facility];  Service: Endoscopy;  Laterality: N/A;  requests early   HERNIA REPAIR  July 2019   INSERTION OF MESH N/A 08/31/2017   Procedure: INSERTION OF MESH;  Surgeon: Ancil Linsey, MD;  Location: [facility];  Service: General;  Laterality: N/A;   IRRIGATION AND DEBRIDEMENT ABSCESS N/A 03/10/2023   Procedure: IRRIGATION AND DEBRIDEMENT ABDOMINAL WALL ABSCESS;  Surgeon: Leafy Ro, MD;  Location: [facility];  Service: General;  Laterality: N/A;   POLYPECTOMY  01/17/2018   Procedure: POLYPECTOMY;  Surgeon: Midge Minium, MD;  Location: [facility];  Service: Endoscopy;;   UMBILICAL HERNIA REPAIR N/A 08/31/2017   Procedure: HERNIA REPAIR UMBILICAL ADULT;  Surgeon: Ancil Linsey, MD;  Location: [facility];  Service: General;  Laterality: N/A;   VEIN SURGERY     2 stents in R) leg    Social History   Tobacco Use   Smoking  status: Every Day    Current packs/day: 1.50    Average packs/day: 1.5 packs/day for 50.0 years (75.0 ttl pk-yrs)    Types: Cigarettes   Smokeless tobacco: Never  Vaping Use   Vaping status: Never Used  Substance Use Topics   Alcohol use: Yes    Comment: once a month   Drug use: No     Medication list has been reviewed and updated.  Current Meds  Medication Sig   acetaminophen (TYLENOL) 325 MG tablet Take 325 mg by mouth daily as needed for moderate pain or headache.   albuterol (VENTOLIN HFA) 108 (90 Base) MCG/ACT inhaler Inhale into the lungs. Inhale 2 inhalations into the lungs 4 (four) times daily PRN   amLODipine (NORVASC) 5 MG tablet Take 5 mg by mouth daily.   azelastine (ASTELIN) 0.1 % nasal spray Place 2 sprays into both nostrils 2 (two) times daily. Use in each nostril as directed   cetirizine (ZYRTEC) 10 MG tablet Take 1 tablet (10 mg total) by mouth daily.   clopidogrel (PLAVIX) 75 MG tablet Take 1 tablet (75 mg total) by mouth daily.   fluticasone (FLONASE) 50 MCG/ACT nasal spray SPRAY 2 SPRAYS INTO EACH NOSTRIL EVERY DAY   ibuprofen (ADVIL,MOTRIN) 200 MG tablet Take 400 mg by mouth  every 8 (eight) hours as needed (for pain).   levothyroxine (SYNTHROID) 112 MCG tablet TAKE 1 TABLET BY MOUTH EVERY DAY   losartan (COZAAR) 100 MG tablet Take 100 mg by mouth daily.   metFORMIN (GLUCOPHAGE) 500 MG tablet TAKE 1 TABLET BY MOUTH EVERY DAY WITH BREAKFAST   pantoprazole (PROTONIX) 40 MG tablet Take 1 tablet (40 mg total) by mouth daily.   rosuvastatin (CRESTOR) 40 MG tablet Take 40 mg by mouth daily.   sertraline (ZOLOFT) 100 MG tablet Take 1 tablet (100 mg total) by mouth daily.       04/05/2023   11:19 AM 03/09/2023    3:13 PM 11/27/2022    9:46 AM 10/27/2022    3:10 PM  GAD 7 : Generalized Anxiety Score  Nervous, Anxious, on Edge 0 1 0 0  Control/stop worrying 0 1 0 0  Worry too much - different things 0 1 0 0  Trouble relaxing 0 0 0 0  Restless 0 0 0 0  Easily annoyed  or irritable 0 0 0 0  Afraid - awful might happen 0 0 0 0  Total GAD 7 Score 0 3 0 0  Anxiety Difficulty Not difficult at all Not difficult at all Not difficult at all Not difficult at all       04/05/2023   11:19 AM 03/09/2023    3:13 PM 11/27/2022    9:46 AM  Depression screen PHQ 2/9  Decreased Interest 0 0 0  Down, Depressed, Hopeless 0 0 0  PHQ - 2 Score 0 0 0  Altered sleeping 0 0 0  Tired, decreased energy 0 0 0  Change in appetite 0 0 0  Feeling bad or failure about yourself  0 0 0  Trouble concentrating 0 0 0  Moving slowly or fidgety/restless 0 0 0  Suicidal thoughts 0 0 0  PHQ-9 Score 0 0 0  Difficult doing work/chores Not difficult at all Not difficult at all Not difficult at all    BP Readings from Last 3 Encounters:  04/05/23 122/64  03/24/23 124/80  03/18/23 112/68    Physical Exam Vitals and nursing note reviewed.  HENT:     Head: Normocephalic.     Right Ear: Tympanic membrane and external ear normal.     Left Ear: Tympanic membrane and external ear normal.     Nose: Nose normal.     Mouth/Throat:     Mouth: Mucous membranes are moist.  Eyes:     General: No scleral icterus.       Right eye: No discharge.        Left eye: No discharge.     Conjunctiva/sclera: Conjunctivae normal.     Pupils: Pupils are equal, round, and reactive to light.  Neck:     Thyroid: No thyromegaly.     Vascular: No JVD.     Trachea: No tracheal deviation.  Cardiovascular:     Rate and Rhythm: Normal rate and regular rhythm.     Heart sounds: Normal heart sounds. No murmur heard.    No friction rub. No gallop.  Pulmonary:     Effort: No respiratory distress.     Breath sounds: Normal breath sounds. No wheezing or rales.  Abdominal:     General: Bowel sounds are normal.     Palpations: Abdomen is soft. There is no mass.     Tenderness: There is no abdominal tenderness. There is no guarding or rebound.  Musculoskeletal:  General: No tenderness. Normal range of  motion.     Cervical back: Normal range of motion and neck supple.  Lymphadenopathy:     Cervical: No cervical adenopathy.  Skin:    General: Skin is warm.     Findings: No rash.  Neurological:     Mental Status: He is alert and oriented to person, place, and time.     Cranial Nerves: No cranial nerve deficit.     Deep Tendon Reflexes: Reflexes are normal and symmetric.     Wt Readings from Last 3 Encounters:  04/05/23 159 lb (72.1 kg)  03/24/23 152 lb 3.2 oz (69 kg)  03/18/23 151 lb (68.5 kg)    BP 122/64   Pulse 88   Ht 5\' 8"  (1.727 m)   Wt 159 lb (72.1 kg)   SpO2 96%   BMI 24.18 kg/m   Assessment and Plan: 1. Diabetes mellitus treated with oral medication (HCC) Chronic.  Controlled.  Stable.  Asymptomatic.  Tolerating current dosing of metformin 500 mg once a day.  A1c's have been in control range in the mid to upper 6 level.  Will continue metformin 500 mg 1 a day in the mornings and will hold on A1c as that this has been done within a 4 months. - metFORMIN (GLUCOPHAGE) 500 MG tablet; TAKE 1 TABLET BY MOUTH EVERY DAY WITH BREAKFAST  Dispense: 90 tablet; Refill: 1  2. Adult hypothyroidism Chronic.  Controlled.  Stable.  Asymptomatic.  Currently on levothyroxine 112 mg once a day.  Will check thyroid panel with TSH to see if appropriate dosing is current. - levothyroxine (SYNTHROID) 112 MCG tablet; Take 1 tablet (112 mcg total) by mouth daily.  Dispense: 90 tablet; Refill: 1 - Thyroid Panel With TSH  3. Atherosclerosis of aorta (HCC) Chronic.  Controlled.  Stable.  Patient with history of atherosclerosis of the aorta controlled with reduction of lipid and we will check lipid panel and as well as continuance of Plavix 75 mg daily. - clopidogrel (PLAVIX) 75 MG tablet; Take 1 tablet (75 mg total) by mouth daily.  Dispense: 90 tablet; Refill: 1 - Lipid Panel With LDL/HDL Ratio  4. Gastroesophageal reflux disease, unspecified whether esophagitis present Chronic.  Controlled.   Stable.  Asymptomatic since initiation of pantoprazole.  Continue pantoprazole 40 mg once a day. - pantoprazole (PROTONIX) 40 MG tablet; Take 1 tablet (40 mg total) by mouth daily.  Dispense: 90 tablet; Refill: 1  5. Chronic anxiety Chronic.  Controlled.  Stable.  PHQ 0 GAD score 0 continue sertraline 100 mg once a day. - sertraline (ZOLOFT) 100 MG tablet; Take 1 tablet (100 mg total) by mouth daily.  Dispense: 90 tablet; Refill: 1  6. Recurrent major depressive disorder, in full remission (HCC) Chronic.  Controlled.  Stable.  PHQ 0 GAD score is 0 continue sertraline 100 mg once a day. - sertraline (ZOLOFT) 100 MG tablet; Take 1 tablet (100 mg total) by mouth daily.  Dispense: 90 tablet; Refill: 1  7. Non-seasonal allergic rhinitis due to pollen Chronic.  Controlled.  Stable.  Continue with Flonase at current dosing.   8. Hyperlipidemia, unspecified hyperlipidemia type Chronic.  Controlled.  Stable.  Currently on rosuvastatin 40 mg once a day.  Will check lipid panel for current level of LDL control.  In the meantime we have encouraged continuing a low-cholesterol low triglyceride dietary guidelines.  This has been placed in patient's MyChart. - Lipid Panel With LDL/HDL Ratio   Elizabeth Sauer, MD

## 2023-04-06 DIAGNOSIS — E039 Hypothyroidism, unspecified: Secondary | ICD-10-CM | POA: Diagnosis not present

## 2023-04-06 DIAGNOSIS — I700 Atherosclerosis of aorta: Secondary | ICD-10-CM | POA: Diagnosis not present

## 2023-04-06 DIAGNOSIS — E785 Hyperlipidemia, unspecified: Secondary | ICD-10-CM | POA: Diagnosis not present

## 2023-04-07 ENCOUNTER — Encounter: Payer: Self-pay | Admitting: Family Medicine

## 2023-04-07 LAB — LIPID PANEL WITH LDL/HDL RATIO
Cholesterol, Total: 118 mg/dL (ref 100–199)
HDL: 45 mg/dL (ref >39)
LDL Chol Calc (NIH): 53 mg/dL (ref 0–99)
LDL/HDL Ratio: 1.2 {ratio} (ref 0.0–3.6)
Triglycerides: 106 mg/dL (ref 0–149)
VLDL Cholesterol Cal: 20 mg/dL (ref 5–40)

## 2023-04-07 LAB — THYROID PANEL WITH TSH
Free Thyroxine Index: 2.2 (ref 1.2–4.9)
T3 Uptake Ratio: 25 % (ref 24–39)
T4, Total: 8.8 ug/dL (ref 4.5–12.0)
TSH: 1.98 u[IU]/mL (ref 0.450–4.500)

## 2023-04-19 ENCOUNTER — Ambulatory Visit (INDEPENDENT_AMBULATORY_CARE_PROVIDER_SITE_OTHER): Payer: Medicare PPO | Admitting: Surgery

## 2023-04-19 ENCOUNTER — Encounter: Payer: Self-pay | Admitting: Surgery

## 2023-04-19 VITALS — BP 164/94 | HR 85 | Temp 98.0°F | Ht 68.0 in | Wt 155.2 lb

## 2023-04-19 DIAGNOSIS — Z09 Encounter for follow-up examination after completed treatment for conditions other than malignant neoplasm: Secondary | ICD-10-CM

## 2023-04-19 DIAGNOSIS — L02211 Cutaneous abscess of abdominal wall: Secondary | ICD-10-CM | POA: Diagnosis not present

## 2023-04-19 MED ORDER — METRONIDAZOLE 500 MG PO TABS: 500.0000 mg | ORAL_TABLET | Freq: Three times a day (TID) | ORAL | 0 refills | Status: DC

## 2023-04-19 MED ORDER — CIPROFLOXACIN HCL 500 MG PO TABS: 500.0000 mg | ORAL_TABLET | Freq: Two times a day (BID) | ORAL | 0 refills | Status: DC

## 2023-04-19 NOTE — Progress Notes (Signed)
Outpatient Surgical Follow Up  04/19/2023  Joel Flores is an 72 y.o. male.   Chief Complaint  Patient presents with   Routine Post Op    Abdomen wall abscess    HPI: Joel Flores is 6-week out from incision and drainage of complex abdominal wall and perihepatic abscess.  Initially did very well but over the last few days reports worsening drainage from wound.  No fevers no chills he is still tolerating p.o.  Past Medical History:  Diagnosis Date   Allergy 1990   Anxiety    PANIC ATTACKS   Arthritis    right shoulder   Atherosclerosis of aorta (HCC) 02/12/2015   Cataract    COPD (chronic obstructive pulmonary disease) (HCC)    Cough    Depression    DM (diabetes mellitus) (HCC) 03/10/2023   Environmental and seasonal allergies    Essential hypertension 02/12/2015   Family history of adverse reaction to anesthesia    Father - 6 - MI, then stroke after Prostate surgery   GERD (gastroesophageal reflux disease)    History of hiatal hernia    Hyperlipidemia    Hypertension    Hypothyroidism    Incisional hernia, without obstruction or gangrene    PVD (peripheral vascular disease) (HCC) 11/17/2016   Thyroid disease    Venous insufficiency    Vertigo    can happen daily   Wears hearing aid in both ears    has, does not wear    Past Surgical History:  Procedure Laterality Date   CATARACT EXTRACTION W/PHACO Right 03/16/2018   Procedure: CATARACT EXTRACTION PHACO AND INTRAOCULAR LENS PLACEMENT (IOC);  Surgeon: Elliot Cousin, MD;  Location: [facility];  Service: Ophthalmology;  Laterality: Right;  [place]  01:05 CDE 11.85 Fluid pack lot # [id number]   CATARACT EXTRACTION W/PHACO Left 05/08/2020   Procedure: CATARACT EXTRACTION PHACO AND INTRAOCULAR LENS PLACEMENT (IOC) LEFT 7.32 00:54.7 13.4%;  Surgeon: Lockie Mola, MD;  Location: [facility];  Service: Ophthalmology;  Laterality: Left;   CHOLECYSTECTOMY     COLONOSCOPY  2015   Dr Servando Snare- cleared for 5 years     COLONOSCOPY WITH PROPOFOL N/A 01/17/2018   Procedure: COLONOSCOPY WITH PROPOFOL;  Surgeon: Midge Minium, MD;  Location: [facility];  Service: Endoscopy;  Laterality: N/A;  requests early   HERNIA REPAIR  July 2019   INSERTION OF MESH N/A 08/31/2017   Procedure: INSERTION OF MESH;  Surgeon: Ancil Linsey, MD;  Location: [facility];  Service: General;  Laterality: N/A;   IRRIGATION AND DEBRIDEMENT ABSCESS N/A 03/10/2023   Procedure: IRRIGATION AND DEBRIDEMENT ABDOMINAL WALL ABSCESS;  Surgeon: Leafy Ro, MD;  Location: [facility];  Service: General;  Laterality: N/A;   POLYPECTOMY  01/17/2018   Procedure: POLYPECTOMY;  Surgeon: Midge Minium, MD;  Location: [facility];  Service: Endoscopy;;   UMBILICAL HERNIA REPAIR N/A 08/31/2017   Procedure: HERNIA REPAIR UMBILICAL ADULT;  Surgeon: Ancil Linsey, MD;  Location: [facility];  Service: General;  Laterality: N/A;   VEIN SURGERY     2 stents in R) leg    Family History  Problem Relation Age of Onset   Diabetes Mother    Cancer Father    Heart disease Father    Stroke Father     Social History:  reports that he has been smoking cigarettes. He has a 75 pack-year smoking history. He has never used smokeless tobacco. He reports current alcohol use. He reports that he does not use  drugs.  Allergies:  Allergies  Allergen Reactions   Sulfa Antibiotics Rash    Medications reviewed.    ROS Full ROS performed and is otherwise negative other than what is stated in HPI   BP (!) 164/94   Pulse 85   Temp 98 F (36.7 C) (Oral)   Ht 5\' 8"  (1.727 m)   Wt 155 lb 3.2 oz (70.4 kg)   SpO2 98%   BMI 23.60 kg/m   Physical Exam NAD, not toxic Abd: soft, there is some fullness to the right abd wall. Small wound, I was able to probe w q tip and drained about 30cc of frank pus, this was cultured    Assessment/Plan: We will treat cipro and flagyl an do CT Does not require hospitalization but does require short f/u If  a/bs failed may need to go back for formal debridement   [name], MD [facility] General Surgeon

## 2023-04-19 NOTE — Patient Instructions (Signed)
We have scheduled you for a CT Scan of your Abdomen and Pelvis with contrast. This has been scheduled at [facility] on 04/26/2023 . Please arrive there by 1:15 pm for a 3:30 pm . If you need to reschedule your Scan, you may do so by calling [phone]. Please let us know if you reschedule your scan as we have to get authorization from your insurance for this.

## 2023-04-25 LAB — ANAEROBIC AND AEROBIC CULTURE

## 2023-04-26 ENCOUNTER — Ambulatory Visit
Admission: RE | Admit: 2023-04-26 | Discharge: 2023-04-26 | Disposition: A | Discharge status: Home / Self Care | Payer: Medicare PPO | Source: Ambulatory Visit | Attending: Surgery | Admitting: Surgery

## 2023-04-26 DIAGNOSIS — K5750 Diverticulosis of both small and large intestine without perforation or abscess without bleeding: Secondary | ICD-10-CM | POA: Diagnosis not present

## 2023-04-26 DIAGNOSIS — L02211 Cutaneous abscess of abdominal wall: Secondary | ICD-10-CM | POA: Insufficient documentation

## 2023-04-26 LAB — POCT I-STAT CREATININE: Creatinine, Ser: 0.8 mg/dL (ref 0.61–1.24)

## 2023-04-26 MED ORDER — IOHEXOL 300 MG/ML  SOLN
80.0000 mL | Freq: Once | INTRAMUSCULAR | Status: AC | PRN
Start: 2023-04-26 — End: 2023-04-26
  Administered 2023-04-26: 80 mL via INTRAVENOUS

## 2023-05-05 ENCOUNTER — Telehealth: Payer: Self-pay | Admitting: Surgery

## 2023-05-05 ENCOUNTER — Telehealth: Payer: Self-pay | Admitting: Physician Assistant

## 2023-05-05 ENCOUNTER — Other Ambulatory Visit: Payer: Self-pay | Admitting: Physician Assistant

## 2023-05-05 ENCOUNTER — Encounter: Payer: Medicare PPO | Admitting: Physician Assistant

## 2023-05-05 MED ORDER — METRONIDAZOLE 500 MG PO TABS: 500.0000 mg | ORAL_TABLET | Freq: Three times a day (TID) | ORAL | 0 refills | Status: AC

## 2023-05-05 MED ORDER — CIPROFLOXACIN HCL 500 MG PO TABS: 500.0000 mg | ORAL_TABLET | Freq: Two times a day (BID) | ORAL | 0 refills | Status: AC

## 2023-05-05 NOTE — Telephone Encounter (Signed)
 Patient has abscess abdominal wall. CT scan was done on 04/26/23. Patient has his post op with Dr Jordis on 05/10/23.  Patient states hasn't heard anything regarding results of his CT scan and also now the abscess has closed, he feels like there is a lot more puss and because of the closure not able to get any of the drainage out.  This is bothering him.  Wants to know if can be seen sooner, does not want to wait until Monday for this.  Please call patient.  Thank you.

## 2023-05-05 NOTE — Telephone Encounter (Signed)
 Lynden Oxford, PA-C notified of such and will call the patient.

## 2023-05-05 NOTE — Telephone Encounter (Signed)
 Spoke with patient regarding his CT Abdomen/Pelvis from 12/30 which I have personally reviewed. His superficial RUQ abscess has recurred and there remains an intra-abdominal component to this as noted previously. Radiologist report reviewed below:   IMPRESSION: Large, complex, persistent abdominal wall and perihepatic abscess, which is increased in size compared to prior examination, at which time the superficial component overlying the right upper quadrant was packed with wound dressing material. On today's examination, this dominant component measures 5.5 x 2.7 cm, previously 3.0 x 1.3 cm. Elongated perihepatic component measures 10.9 x 2.6 cm, previously 10.4 x 1.5 cm.  Patient reports about 5 days ago his previous opening had closed over and he can no longer drain anything from this. He is worried about this re-accumulating prior to his appointment with Dr Jordis. He is otherwise afebrile. He has completed his course of Abx. I did review Cx from 12/23 which shows Klebsiella; sensitivities reviewed as well  I am concerned given his recurrence that he may need more formal debridement, potentially even of this intra-abdominal component -vs- drain placement into this.   I will see him tomorrow (01/09) and potentially open this superficial portion up again to allow for drainage if amenable I will restart his Abx He will keep his appointment with Dr Jordis on 01/13 as scheduled  All questions answered.   -- Arthea Platt, PA-C [facility] 05/05/2023, 4:09 PM M-F: 7am - 4pm

## 2023-05-06 ENCOUNTER — Encounter: Payer: Self-pay | Admitting: Physician Assistant

## 2023-05-06 ENCOUNTER — Ambulatory Visit (INDEPENDENT_AMBULATORY_CARE_PROVIDER_SITE_OTHER): Payer: Medicare PPO | Admitting: Physician Assistant

## 2023-05-06 VITALS — BP 147/92 | HR 111 | Wt 154.4 lb

## 2023-05-06 DIAGNOSIS — L02211 Cutaneous abscess of abdominal wall: Secondary | ICD-10-CM

## 2023-05-06 DIAGNOSIS — Z09 Encounter for follow-up examination after completed treatment for conditions other than malignant neoplasm: Secondary | ICD-10-CM

## 2023-05-06 NOTE — Patient Instructions (Addendum)

## 2023-05-06 NOTE — Progress Notes (Deleted)
 Procedure Note  Date: 05/06/23 10:33 AM  Preforming Provider: Arthea Platt, PA-C  Pre-Procedure Diagnosis: RUQ Abscess   Post-Procedure Diagnosis: Same  Indications: Joel Flores is a 73 y.o. male known to our practice following RUQ abscess which was debrided in the OR initially with Dr Jordis on 11/13. He has done well in follow up but has had a recurrence of this area of swelling. He did need additional drainage in the office on 12/23. He also underwent CT Abdomen/Pelvis on 12/30 which I have reviewed and is concerning for recurrence as well as a continued intra-abdominal component. Unfortunately, this was not formally read until 01/08. Cx from 12/23 grew klebsiella and sensitivities reviewed. He competed a course of Cipro /Flagyl  and this was restarted on 01/08. He presents today for reassess of this RUQ area of swelling concerning for continued recurrence of this abscess. No fever. Patient is concerned as his previous incision site has healed over and he is worried about fluid continuing to accumulate.   On exam, ***  Anesthesia: *** ccs of 1% lidocaine  with epinephrine   Findings: ***  Details of Procedure:  All risks, benefits, and alternatives to above procedure(s) were discussed with the patient and informed consent was obtained. The RUQ was prepped and draped in standard sterile fashion. *** ccs of 1% lidocaine  with epinephrine  with injected intradermally and adequate anesthesia achieved. Using an ***11 blade scalpel, ***. The wound was then irrigated with copious amount of NS and ***. The patient tolerated this well without immediate complications.   Complications: None apparent  Plan:  - He will follow up as scheduled with Dr Jordis on 01/13. I do worry he may need more definitive debridement in the OR -vs- potential IR drainage of this intra-abdominal component - He will continue this current course of Abx - Pack wound ***   --  Arthea Platt, PA-C [facility] 05/06/2023, 10:33 AM M-F: 7am - 4pm

## 2023-05-06 NOTE — Progress Notes (Signed)
 [facility] POST-OP OFFICE VISIT  05/06/2023  HPI: Joel Flores is a 73 y.o. male known to our practice following RUQ abscess which was debrided in the OR initially with Dr Jordis on 11/13. He has done well in follow up but has had a recurrence of this area of swelling. He did need additional drainage in the office on 12/23. He also underwent CT Abdomen/Pelvis on 12/30 which I have reviewed and is concerning for recurrence as well as a continued intra-abdominal component. Unfortunately, this was not formally read until 01/08. Cx from 12/23 grew klebsiella and sensitivities reviewed. He competed a course of Cipro /Flagyl  and this was restarted on 01/08. He presents today for reassess of this RUQ area of swelling concerning for continued recurrence of this abscess. No fever. Patient is concerned as his previous incision site has healed over and he is worried about fluid continuing to accumulate. He did note on Sunday (01/05) he had quite a significant amount of drainage. Since that time the area is less swollen and slightly more indurated than fluctuant. No other complaints.    Vital signs: BP (!) 147/92   Pulse (!) 111   Wt 154 lb 6.4 oz (70 kg)   SpO2 96%   BMI 23.48 kg/m    Physical Exam: Constitutional: Well appearing male, NAD Abdomen: Abdomen is soft, non-tender, non-distended. He continues to have an area of swelling to the RUQ at previous abscess site. Today, this appears less swollen than I have previously seen. Today, it is more indurated consistent with inflammation. I do not appreciate any superficial fluctuance. The previous I&D site is healed. There is no overlaying erythema   Assessment/Plan: This is a 73 y.o. male with RUQ and possible intra-abdominal abscess, likely communicating, s/p initial debridement on 11/13   - Had a very long discussion with the patient and his wife. We reviewed CT Abdomen/pelvis from 12/30. Unfortunately, this is now 58 days old and likely  no longer reliable. Since that time, he reports a large amount of spontaneous drainage on 01/05. I suspect this drained a significant amount of the recurrence spontaneously given his physical examination today. There is no obvious areas of fluctuance. He had also completed Abx which he feels helped. I discussed potential options today. I did offer repeat I&D to allow for drainage and for peace of mind given his concerns for more fluid building back up. But again, on exam, there was no significant evidence of fluctuance. The second option would be to stay the course, restart Abx as we have done yesterday (01/08) and continue with follow up with Dr Jordis as scheduled on 01/13. I do worry that given this recurrence, he may need more definitive debridement and this intra-abdominal component may need to be addressed. Of course, I will defer this to Dr Jordis. Right now, he is clinically very stable, afebrile, and without pain. He is certainly non-toxic. I do not think he needs admission, IV Abx, or more emergent procedures at this time. After length discussion, patient (and his wife) elected to hold off on any repeat incisions and drainage in office for now, which I think is very reasonable. I did review signs and symptoms, including but not limited to fever >101F, abdominal pain, worsening swelling or erythema, with the patient and his wife. They voiced understanding. Patient, and his wife, were thankful for reassurance. All questions and concerns addressed and answered.     - He will follow up as scheduled on 01/13; He understands to  call with questions/concerns in the interim  Face-to-face time spent with the patient and care providers was >30 minutes, with more than 50% of the time spent counseling, educating, and coordinating care of the patient.     -- Arthea Platt, PA-C [facility] 05/06/2023, 11:22 AM M-F: 7am - 4pm

## 2023-05-10 ENCOUNTER — Ambulatory Visit (INDEPENDENT_AMBULATORY_CARE_PROVIDER_SITE_OTHER): Payer: Medicare PPO | Admitting: Surgery

## 2023-05-10 ENCOUNTER — Encounter: Payer: Self-pay | Admitting: Surgery

## 2023-05-10 VITALS — BP 168/81 | HR 98 | Temp 98.2°F | Ht 68.0 in | Wt 152.0 lb

## 2023-05-10 DIAGNOSIS — L02211 Cutaneous abscess of abdominal wall: Secondary | ICD-10-CM

## 2023-05-10 NOTE — Patient Instructions (Addendum)
 We will get you scheduled for a drain placement with Interventional Radiology.  Today we have drained your Abscess in the office. The numbing medication will wear off in approximately 4-8 hours. You will have some pain to the area afterwards but should not be as severe as prior to the procedure.  If you have been given antibiotics, please continue to take them after your procedure.  You may take 2 extra strength Tylenol , or 3-4 regular Ibuprofen tablets every 6-8 hours as needed for pain and discomfort.  You may shower. First remove all of the packing and wash letting the warm soapy water  run over the area, rinse well, and pat dry.   Fill the wound with packing, do not force it in, leave a short tail out and then cover with gauze and tape in place. The area may drain more the first 2 days and you may have to change the gauze more often. Only repack the area once a day.   We will see you back as scheduled below.   If you have any questions or concerns prior to your appointment, please call our office and speak with a nurse.  Incision and Drainage Incision and drainage is a surgical procedure to open and drain a fluid-filled sac. The sac may be filled with pus, mucus, or blood. Examples of fluid-filled sacs that may need surgical drainage include cysts, skin infections (abscesses), and red lumps that develop from a ruptured cyst or a small abscess (boils). You may need this procedure if the affected area is large, painful, infected, or not healing well. Tell a health care provider about: Any allergies you have. All medicines you are taking, including vitamins, herbs, eye drops, creams, and over-the-counter medicines. Any problems you or family members have had with anesthetic medicines. Any blood disorders you have. Any surgeries you have had. Any medical conditions you have. Whether you are pregnant or may be pregnant. What are the risks? Generally, this is a safe procedure. However,  problems may occur, including: Infection. Bleeding. Allergic reactions to medicines. Scarring.  What happens before the procedure? You may need an ultrasound or other imaging tests to see how large or deep the fluid-filled sac is. You may have blood tests to check for infection. You may get a tetanus shot. You may be given antibiotic medicine to help prevent infection. Follow instructions from your health care provider about eating or drinking restrictions. Ask your health care provider about: Changing or stopping your regular medicines. This is especially important if you are taking diabetes medicines or blood thinners. Taking medicines such as aspirin and ibuprofen. These medicines can thin your blood. Do not take these medicines before your procedure if your health care provider instructs you not to. Plan to have someone take you home after the procedure. If you will be going home right after the procedure, plan to have someone stay with you for 24 hours. What happens during the procedure? To reduce your risk of infection: Your health care team will wash or sanitize their hands. Your skin will be washed with soap. You will be given one or more of the following: A medicine to help you relax (sedative). A medicine to numb the area (local anesthetic). A medicine to make you fall asleep (general anesthetic). An incision will be made in the top of the fluid-filled sac. The contents of the sac may be squeezed out, or a syringe or tube (catheter) may be used to empty the sac. The catheter may be  left in place for several weeks to drain any fluid. Or, your health care provider may stitch open the edges of the incision to make a long-term opening for drainage (marsupialization). The inside of the sac may be washed out (irrigated) with a sterile solution and packed with gauze before it is covered with a bandage (dressing). The procedure may vary among health care providers and hospitals. What  happens after the procedure? Your blood pressure, heart rate, breathing rate, and blood oxygen level will be monitored often until the medicines you were given have worn off. Do not drive for 24 hours if you received a sedative. This information is not intended to replace advice given to you by your health care provider. Make sure you discuss any questions you have with your health care provider. Document Released: 10/07/2000 Document Revised: 09/19/2015 Document Reviewed: 02/01/2015 Elsevier Interactive Patient Education  2017 [employer]   Incision and Drainage, Care After Refer to this sheet in the next few weeks. These instructions provide you with information about caring for yourself after your procedure. Your health care provider may also give you more specific instructions. Your treatment has been planned according to current medical practices, but problems sometimes occur. Call your health care provider if you have any problems or questions after your procedure. What can I expect after the procedure? After the procedure, it is common to have: Pain or discomfort around your incision site. Drainage from your incision.  Follow these instructions at home: Take over-the-counter and prescription medicines only as told by your health care provider. If you were prescribed an antibiotic medicine, take it as told by your health care provider. Do not stop taking the antibiotic even if you start to feel better. Follow instructions from your health care provider about: How to take care of your incision. When and how you should change your packing and bandage (dressing). Wash your hands with soap and water  before you change your dressing. If soap and water  are not available, use hand sanitizer. When you should remove your dressing. Do not take baths, swim, or use a hot tub until your health care provider approves. Keep all follow-up visits as told by your health care provider. This is  important. Check your incision area every day for signs of infection. Check for: More redness, swelling, or pain. More fluid or blood. Warmth. Pus or a bad smell. Contact a health care provider if: Your cyst or abscess returns. You have a fever. You have more redness, swelling, or pain around your incision. You have more fluid or blood coming from your incision. Your incision feels warm to the touch. You have pus or a bad smell coming from your incision. Get help right away if: You have severe pain or bleeding. You cannot eat or drink without vomiting. You have decreased urine output. You become short of breath. You have chest pain. You cough up blood. The area where the incision and drainage occurred becomes numb or it tingles. This information is not intended to replace advice given to you by your health care provider. Make sure you discuss any questions you have with your health care provider. Document Released: 07/06/2011 Document Revised: 09/13/2015 Document Reviewed: 02/01/2015 Elsevier Interactive Patient Education  2017 [employer].

## 2023-05-11 NOTE — Progress Notes (Signed)
 [name], MD sent to Joel Flores Approved for CT Guided drain placement x 3 for multiple recurrent abscesses due to retained/entrapped gallstones.  Please schedule for Dr. Archer Asa.  Will work toward removing stones with a scope.  HKM

## 2023-05-12 ENCOUNTER — Other Ambulatory Visit: Payer: Self-pay

## 2023-05-12 ENCOUNTER — Encounter: Payer: Self-pay | Admitting: Surgery

## 2023-05-12 DIAGNOSIS — L02211 Cutaneous abscess of abdominal wall: Secondary | ICD-10-CM

## 2023-05-12 NOTE — Progress Notes (Signed)
 Outpatient Surgical Follow Up   Joel Flores is an 73 y.o. male.   Chief Complaint  Patient presents with   Routine Post Op    HPI: Joel Flores is 3 MONTHS out from incision and drainage of complex abdominal wall and perihepatic abscess.  Continue to endorse chronic drainage from the wound site.  He was placed on antibiotics and I did some cultures does visit showing fasciola system to ampicillin but sensitive to everything else. I did order a CT scan that I have personally reviewed showing evidence of continue intra-abdominal abscess he was restarted on Cipro  and Flagyl  and is doing reasonable he is taking p.o. he is ambulating he has not had any fevers or chills      Past Medical History:  Diagnosis Date   Allergy 1990   Anxiety    PANIC ATTACKS   Arthritis    right shoulder   Atherosclerosis of aorta (HCC) 02/12/2015   Cataract    COPD (chronic obstructive pulmonary disease) (HCC)    Cough    Depression    DM (diabetes mellitus) (HCC) 03/10/2023   Environmental and seasonal allergies    Essential hypertension 02/12/2015   Family history of adverse reaction to anesthesia    Father - 1 - MI, then stroke after Prostate surgery   GERD (gastroesophageal reflux disease)    History of hiatal hernia    Hyperlipidemia    Hypertension    Hypothyroidism    Incisional hernia, without obstruction or gangrene    PVD (peripheral vascular disease) (HCC) 11/17/2016   Thyroid  disease    Venous insufficiency    Vertigo    can happen daily   Wears hearing aid in both ears    has, does not wear    Past Surgical History:  Procedure Laterality Date   CATARACT EXTRACTION W/PHACO Right 03/16/2018   Procedure: CATARACT EXTRACTION PHACO AND INTRAOCULAR LENS PLACEMENT (IOC);  Surgeon: Ferol Rogue, MD;  Location: [facility];  Service: Ophthalmology;  Laterality: Right;  US   01:05 CDE 11.85 Fluid pack lot # [id number]   CATARACT EXTRACTION W/PHACO Left 05/08/2020   Procedure: CATARACT  EXTRACTION PHACO AND INTRAOCULAR LENS PLACEMENT (IOC) LEFT 7.32 00:54.7 13.4%;  Surgeon: Mittie Gaskin, MD;  Location: [facility];  Service: Ophthalmology;  Laterality: Left;   CHOLECYSTECTOMY     COLONOSCOPY  2015   Dr Jinny- cleared for 5 years    COLONOSCOPY WITH PROPOFOL  N/A 01/17/2018   Procedure: COLONOSCOPY WITH PROPOFOL ;  Surgeon: Jinny Carmine, MD;  Location: [facility];  Service: Endoscopy;  Laterality: N/A;  requests early   HERNIA REPAIR  July 2019   INSERTION OF MESH N/A 08/31/2017   Procedure: INSERTION OF MESH;  Surgeon: Nicholaus Selinda Birmingham, MD;  Location: [facility];  Service: General;  Laterality: N/A;   IRRIGATION AND DEBRIDEMENT ABSCESS N/A 03/10/2023   Procedure: IRRIGATION AND DEBRIDEMENT ABDOMINAL WALL ABSCESS;  Surgeon: Jordis Laneta FALCON, MD;  Location: [facility];  Service: General;  Laterality: N/A;   POLYPECTOMY  01/17/2018   Procedure: POLYPECTOMY;  Surgeon: Jinny Carmine, MD;  Location: [facility];  Service: Endoscopy;;   UMBILICAL HERNIA REPAIR N/A 08/31/2017   Procedure: HERNIA REPAIR UMBILICAL ADULT;  Surgeon: Nicholaus Selinda Birmingham, MD;  Location: [facility];  Service: General;  Laterality: N/A;   VEIN SURGERY     2 stents in R) leg    Family History  Problem Relation Age of Onset   Diabetes Mother    Cancer Father  Heart disease Father    Stroke Father     Social History:  reports that he has been smoking cigarettes. He has a 75 pack-year smoking history. He has been exposed to tobacco smoke. He has never used smokeless tobacco. He reports current alcohol use. He reports that he does not use drugs.  Allergies:  Allergies  Allergen Reactions   Sulfa Antibiotics Rash    Medications reviewed.    ROS Full ROS performed and is otherwise negative other than what is stated in HPI   BP (!) 168/81   Pulse 98   Temp 98.2 F (36.8 C)   Ht 5' 8 (1.727 m)   Wt 152 lb (68.9 kg)   SpO2 99%   BMI 23.11 kg/m   Physical  Exam Vitals and nursing note reviewed. Exam conducted with a chaperone present.  Constitutional:      Appearance: Normal appearance. He is not ill-appearing or diaphoretic.  Cardiovascular:     Rate and Rhythm: Normal rate and regular rhythm.  Pulmonary:     Effort: Pulmonary effort is normal. No respiratory distress.     Breath sounds: Normal breath sounds. No stridor.  Abdominal:     General: Abdomen is flat. There is no distension.     Palpations: Abdomen is soft. There is no mass.     Tenderness: There is no abdominal tenderness. There is no guarding or rebound.     Hernia: No hernia is present.     Comments: Evidence of fluctuance as well as induration right upper quadrant abdominal wall with evidence of abscess.  No evidence of necrotizing infection  Musculoskeletal:        General: No swelling or tenderness. Normal range of motion.     Cervical back: Normal range of motion and neck supple. No rigidity or tenderness.  Skin:    General: Skin is warm and dry.     Capillary Refill: Capillary refill takes less than 2 seconds.  Neurological:     General: No focal deficit present.     Mental Status: He is alert and oriented to person, place, and time.  Psychiatric:        Mood and Affect: Mood normal.        Behavior: Behavior normal.        Thought Content: Thought content normal.        Judgment: Judgment normal.       Assessment/Plan:  73 year old male with recurrent abdominal wall abscess tracking into the liver.  Discussed with the patient in detail.  He does seem to have an area of fluctuance in the abdominal wall and I do think that doing a formal I&D here at the bedside will be in his best interest.  Discussed with the patient in detail about the procedure and the risk the benefit and the possible complications he is in agreement.  I also think that given the intrahepatic portion we will request interventional radiology to place a drain.  That way we will be addressing  both superficial and deep components. Note that I spent 40 minutes in this encounter including extensive review of medical records, extensive review of imaging studies personally, coordinating his care to include coordination of care with interventional radiology.  He is not toxic and does not require hospitalization at this time.  PROCEDURE 1.  And drainage of complex abdominal wall abscess 2. Debridement of skin subcutaneous tissue measuring 5 cm  ANESTHESIA: lidocaine  1% w epi  EBL : Minimal  FINDINGS;  complex abscess abdominal wall we drained at least 60 cc of green-yellow pus  After informed consent was obtained the patient was placed in a supine position he was prepped and draped in the usual sterile fashion.  15 blade knife elliptical incision was created and hemostat was used to spread the subcutaneous tissue.  We noticed that there was a significant amount of pus.  All the pus was able to squeeze out.  I also finger dissected the cavity to lyse some loculations. Since some scissors with debrided the subcutaneous tissue down to the muscle in the standard fashion.  The wound was irrigated with saline.  Packing was placed.  Stasis obtained with pressure.  No complications   Laneta Luna, MD [facility] General Surgeon

## 2023-05-14 ENCOUNTER — Other Ambulatory Visit (HOSPITAL_COMMUNITY): Payer: Self-pay | Admitting: Student

## 2023-05-14 DIAGNOSIS — L02211 Cutaneous abscess of abdominal wall: Secondary | ICD-10-CM

## 2023-05-14 NOTE — Progress Notes (Signed)
Duanne Limerick, MD sent to Markus Daft Patient may hold plavix for 5 day period       Previous Messages    ----- Message ----- From: Markus Daft Sent: 05/13/2023   1:22 PM EST To: Duanne Limerick, MD Subject: ATTN: Dr Dub Amis, MD   Med Clearance           Good Afternoon:   This patient has been referred for a CT guided drain placement x 3 for multiple recurrent abdominal abscesses by Dr [name], MD from [facility]. Records indicate that the patient is taking Plavix that is prescribed and monitored by Dr Elizabeth Sauer, MD.  Dr Lura Em, MD from [facility] would like to obtain a Medical Clearance which will require the patient to hold the medication for 5 days with the procedure on the 6 day.  We will need medical clearance for the patient to hold medication before the procedure can be scheduled.  Please respond as soon as possible whether the patient may or may not hold their anticoagulation therapy.

## 2023-05-14 NOTE — Progress Notes (Signed)
Patient for CT guided drain placement x 3 on Monday 05/17/2023, I called and spoke with the patient on the phone and gave pre-procedure instructions. Pt was made aware to be here at 12p, NPO after MN prior to procedure as well as driver post procedure/recovery/discharge. Pt stated understanding.  Called 05/14/2023

## 2023-05-17 ENCOUNTER — Other Ambulatory Visit: Payer: Self-pay | Admitting: Surgery

## 2023-05-17 ENCOUNTER — Other Ambulatory Visit: Payer: Self-pay

## 2023-05-17 ENCOUNTER — Other Ambulatory Visit: Payer: Self-pay | Admitting: Physician Assistant

## 2023-05-17 ENCOUNTER — Ambulatory Visit
Admission: RE | Admit: 2023-05-17 | Discharge: 2023-05-17 | Disposition: A | Discharge status: Home / Self Care | Payer: Medicare PPO | Source: Ambulatory Visit | Attending: Surgery | Admitting: Surgery

## 2023-05-17 DIAGNOSIS — Z860100 Personal history of colon polyps, unspecified: Secondary | ICD-10-CM | POA: Insufficient documentation

## 2023-05-17 DIAGNOSIS — K750 Abscess of liver: Secondary | ICD-10-CM | POA: Insufficient documentation

## 2023-05-17 DIAGNOSIS — F419 Anxiety disorder, unspecified: Secondary | ICD-10-CM | POA: Insufficient documentation

## 2023-05-17 DIAGNOSIS — Z7902 Long term (current) use of antithrombotics/antiplatelets: Secondary | ICD-10-CM | POA: Diagnosis not present

## 2023-05-17 DIAGNOSIS — I6523 Occlusion and stenosis of bilateral carotid arteries: Secondary | ICD-10-CM | POA: Diagnosis not present

## 2023-05-17 DIAGNOSIS — L02211 Cutaneous abscess of abdominal wall: Secondary | ICD-10-CM | POA: Diagnosis not present

## 2023-05-17 DIAGNOSIS — E039 Hypothyroidism, unspecified: Secondary | ICD-10-CM | POA: Insufficient documentation

## 2023-05-17 DIAGNOSIS — K651 Peritoneal abscess: Secondary | ICD-10-CM | POA: Diagnosis not present

## 2023-05-17 DIAGNOSIS — Z7984 Long term (current) use of oral hypoglycemic drugs: Secondary | ICD-10-CM | POA: Insufficient documentation

## 2023-05-17 DIAGNOSIS — I10 Essential (primary) hypertension: Secondary | ICD-10-CM | POA: Insufficient documentation

## 2023-05-17 DIAGNOSIS — Z9049 Acquired absence of other specified parts of digestive tract: Secondary | ICD-10-CM | POA: Diagnosis not present

## 2023-05-17 DIAGNOSIS — K650 Generalized (acute) peritonitis: Secondary | ICD-10-CM

## 2023-05-17 DIAGNOSIS — F17210 Nicotine dependence, cigarettes, uncomplicated: Secondary | ICD-10-CM | POA: Insufficient documentation

## 2023-05-17 DIAGNOSIS — K219 Gastro-esophageal reflux disease without esophagitis: Secondary | ICD-10-CM | POA: Insufficient documentation

## 2023-05-17 DIAGNOSIS — E1151 Type 2 diabetes mellitus with diabetic peripheral angiopathy without gangrene: Secondary | ICD-10-CM | POA: Diagnosis not present

## 2023-05-17 LAB — PROTIME-INR
INR: 1.1 (ref 0.8–1.2)
Prothrombin Time: 14.2 s (ref 11.4–15.2)

## 2023-05-17 LAB — CBC
HCT: 44.6 % (ref 39.0–52.0)
Hemoglobin: 14.4 g/dL (ref 13.0–17.0)
MCH: 28.1 pg (ref 26.0–34.0)
MCHC: 32.3 g/dL (ref 30.0–36.0)
MCV: 87.1 fL (ref 80.0–100.0)
Platelets: 413 10*3/uL — ABNORMAL HIGH (ref 150–400)
RBC: 5.12 MIL/uL (ref 4.22–5.81)
RDW: 16 % — ABNORMAL HIGH (ref 11.5–15.5)
WBC: 9.1 10*3/uL (ref 4.0–10.5)
nRBC: 0 % (ref 0.0–0.2)

## 2023-05-17 LAB — GLUCOSE, CAPILLARY: Glucose-Capillary: 94 mg/dL (ref 70–99)

## 2023-05-17 MED ORDER — SODIUM CHLORIDE 0.9% FLUSH: 5.0000 mL | Freq: Three times a day (TID) | INTRAVENOUS | Status: DC

## 2023-05-17 MED ORDER — MIDAZOLAM HCL 2 MG/2ML IJ SOLN
INTRAMUSCULAR | Status: AC
  Filled 2023-05-17: qty 2

## 2023-05-17 MED ORDER — MIDAZOLAM HCL 2 MG/2ML IJ SOLN
INTRAMUSCULAR | Status: DC | PRN
  Administered 2023-05-17: 1 mg via INTRAVENOUS

## 2023-05-17 MED ORDER — SODIUM CHLORIDE 0.9% FLUSH
INTRAVENOUS | 3 refills | Status: DC
  Filled 2023-05-17: qty 300, 30d supply, fill #0

## 2023-05-17 MED ORDER — MIDAZOLAM HCL 5 MG/5ML IJ SOLN
INTRAMUSCULAR | Status: DC | PRN
  Administered 2023-05-17 (×2): .5 mg via INTRAVENOUS

## 2023-05-17 MED ORDER — FENTANYL CITRATE (PF) 100 MCG/2ML IJ SOLN
INTRAMUSCULAR | Status: DC | PRN
  Administered 2023-05-17 (×2): 25 ug via INTRAVENOUS
  Administered 2023-05-17: 50 ug via INTRAVENOUS

## 2023-05-17 MED ORDER — FENTANYL CITRATE (PF) 100 MCG/2ML IJ SOLN
INTRAMUSCULAR | Status: AC
  Filled 2023-05-17: qty 2

## 2023-05-17 NOTE — Procedures (Signed)
Interventional Radiology Procedure Note  Procedure: PLacement of two separate 71F drains into the right perihepatic and right mid abdominal wall abscesses.   Complications: None  Estimated Blood Loss: None  Recommendations: - Aspirates sent for culture - Drains to JP - Flush daily - Will discuss stone extraction with Dr. Everlene Farrier  Signed,  [name], MD

## 2023-05-17 NOTE — Progress Notes (Signed)
Patient clinically stable post x2 12 Fr right abdominal abscess CT drains placed per Dr Archer Asa, tolerated well. Received Versed 2 mg along with Fentanyl 100 mcg IV for procedure. Report given to Moreen Fowler Rn post procedure/specials/22.

## 2023-05-17 NOTE — H&P (Signed)
Chief Complaint: Intra-abdominal fluid collections  Referring Provider(s): Carron Brazen  Supervising Physician: Malachy Moan  Patient Status: [facility] - Out-pt  History of Present Illness: Joel Flores is a 73 y.o. male  with medical issues including tobacco use (1-1/2 pack/day for the past 50 years), hypertension, GERD, hypothyroidism, anxiety, bilateral carotid artery stenosis, colonic polyps, diabetes mellitus type 2, melanoma, peripheral vascular disease (Plavix), and history of lap cholecystectomy for perforated gallbladder in 2018.  He presented to the ED on 03/10/23 complaining right flank pain and swelling since August.   CT scan showed= Perihepatic multi septated abscess. Etiology likely due to walled-off dropped gallstones from cholecystectomy/history of perforated cholecystitis.  Finding is located along the retroperitoneum and perihepatic space with extension through the right lateral abdominal wall into the right abdominal musculature with largest component measuring up to (8 x 6 x 10 cm).   Surgery took him to he OR for incision and drainage.   He was seen by Surgery again on 04/19/23 c/o drainage from his surgical wound.  He was started on antibiotics.  CT scan done Large, complex, persistent abdominal wall and perihepatic abscess, which is increased in size compared to prior examination, at which time the superficial component overlying the right upper quadrant was packed with wound dressing material...this dominant component measures 5.5 x 2.7 cm, previously 3.0 x 1.3 cm. Elongated perihepatic component measures 10.9 x 2.6 cm,previously 10.4 x 1.5 cm.  He is here today for drain placements x 3 - then eventual removal of gallstones using choledochoscope one abscesses have resolved.  He is NPO. No nausea/vomiting. No Fever/chills. ROS negative.  Patient is Full Code  Past Medical History:  Diagnosis Date   Allergy 1990   Anxiety    PANIC ATTACKS    Arthritis    right shoulder   Atherosclerosis of aorta (HCC) 02/12/2015   Cataract    COPD (chronic obstructive pulmonary disease) (HCC)    Cough    Depression    DM (diabetes mellitus) (HCC) 03/10/2023   Environmental and seasonal allergies    Essential hypertension 02/12/2015   Family history of adverse reaction to anesthesia    Father - 54 - MI, then stroke after Prostate surgery   GERD (gastroesophageal reflux disease)    History of hiatal hernia    Hyperlipidemia    Hypertension    Hypothyroidism    Incisional hernia, without obstruction or gangrene    PVD (peripheral vascular disease) (HCC) 11/17/2016   Thyroid disease    Venous insufficiency    Vertigo    can happen daily   Wears hearing aid in both ears    has, does not wear    Past Surgical History:  Procedure Laterality Date   CATARACT EXTRACTION W/PHACO Right 03/16/2018   Procedure: CATARACT EXTRACTION PHACO AND INTRAOCULAR LENS PLACEMENT (IOC);  Surgeon: Elliot Cousin, MD;  Location: [facility] ORS;  Service: Ophthalmology;  Laterality: Right;  [place]  01:05 CDE 11.85 Fluid pack lot # [id number]   CATARACT EXTRACTION W/PHACO Left 05/08/2020   Procedure: CATARACT EXTRACTION PHACO AND INTRAOCULAR LENS PLACEMENT (IOC) LEFT 7.32 00:54.7 13.4%;  Surgeon: Lockie Mola, MD;  Location: [facility];  Service: Ophthalmology;  Laterality: Left;   CHOLECYSTECTOMY     COLONOSCOPY  2015   Dr Servando Snare- cleared for 5 years    COLONOSCOPY WITH PROPOFOL N/A 01/17/2018   Procedure: COLONOSCOPY WITH PROPOFOL;  Surgeon: Midge Minium, MD;  Location: [facility];  Service: Endoscopy;  Laterality: N/A;  requests early   HERNIA REPAIR  July 2019   INSERTION OF MESH N/A 08/31/2017   Procedure: INSERTION OF MESH;  Surgeon: Ancil Linsey, MD;  Location: [facility] ORS;  Service: General;  Laterality: N/A;   IRRIGATION AND DEBRIDEMENT ABSCESS N/A 03/10/2023   Procedure: IRRIGATION AND DEBRIDEMENT ABDOMINAL WALL ABSCESS;   Surgeon: Leafy Ro, MD;  Location: [facility] ORS;  Service: General;  Laterality: N/A;   POLYPECTOMY  01/17/2018   Procedure: POLYPECTOMY;  Surgeon: Midge Minium, MD;  Location: [facility];  Service: Endoscopy;;   UMBILICAL HERNIA REPAIR N/A 08/31/2017   Procedure: HERNIA REPAIR UMBILICAL ADULT;  Surgeon: Ancil Linsey, MD;  Location: [facility] ORS;  Service: General;  Laterality: N/A;   VEIN SURGERY     2 stents in R) leg    Allergies: Sulfa antibiotics  Medications: Prior to Admission medications   Medication Sig Start Date End Date Taking? Authorizing Provider  acetaminophen (TYLENOL) 325 MG tablet Take 325 mg by mouth daily as needed for moderate pain or headache.    [provider]  albuterol (VENTOLIN HFA) 108 (90 Base) MCG/ACT inhaler Inhale into the lungs. Inhale 2 inhalations into the lungs 4 (four) times daily PRN 05/26/21   [provider]  amLODipine (NORVASC) 5 MG tablet Take 5 mg by mouth daily.    [provider]  azelastine (ASTELIN) 0.1 % nasal spray Place 2 sprays into both nostrils 2 (two) times daily. Use in each nostril as directed 11/03/21   Duanne Limerick, MD  cetirizine (ZYRTEC) 10 MG tablet Take 1 tablet (10 mg total) by mouth daily. 11/27/22   Duanne Limerick, MD  clopidogrel (PLAVIX) 75 MG tablet Take 1 tablet (75 mg total) by mouth daily. 04/05/23   Duanne Limerick, MD  fluticasone (FLONASE) 50 MCG/ACT nasal spray SPRAY 2 SPRAYS INTO EACH NOSTRIL EVERY DAY 04/05/23   Duanne Limerick, MD  ibuprofen (ADVIL,MOTRIN) 200 MG tablet Take 400 mg by mouth every 8 (eight) hours as needed (for pain).    [provider]  levothyroxine (SYNTHROID) 112 MCG tablet Take 1 tablet (112 mcg total) by mouth daily. 04/05/23   Duanne Limerick, MD  losartan (COZAAR) 100 MG tablet Take 100 mg by mouth daily.    [provider]  metFORMIN (GLUCOPHAGE) 500 MG tablet TAKE 1 TABLET BY MOUTH EVERY DAY WITH BREAKFAST 04/05/23   Duanne Limerick, MD   pantoprazole (PROTONIX) 40 MG tablet Take 1 tablet (40 mg total) by mouth daily. 04/05/23   Duanne Limerick, MD  rosuvastatin (CRESTOR) 40 MG tablet Take 1 tablet (40 mg total) by mouth daily. 04/05/23   Duanne Limerick, MD  sertraline (ZOLOFT) 100 MG tablet Take 1 tablet (100 mg total) by mouth daily. 04/05/23   Duanne Limerick, MD     Family History  Problem Relation Age of Onset   Diabetes Mother    Cancer Father    Heart disease Father    Stroke Father     Social History   Socioeconomic History   Marital status: Married    Spouse name: Not on file   Number of children: 3   Years of education: Not on file   Highest education level: 12th grade  Occupational History   Occupation: retired  Tobacco Use   Smoking status: Every Day    Current packs/day: 1.50    Average packs/day: 1.5 packs/day for 50.0 years (75.0 ttl pk-yrs)    Types: Cigarettes  Passive exposure: Past   Smokeless tobacco: Never  Vaping Use   Vaping status: Never Used  Substance and Sexual Activity   Alcohol use: Yes    Comment: once a month   Drug use: No   Sexual activity: Not Currently  Other Topics Concern   Not on file  Social History Narrative   Not on file   Social Drivers of Health   Financial Resource Strain: Patient Declined (04/05/2023)   Overall Financial Resource Strain (CARDIA)    Difficulty of Paying Living Expenses: Patient declined  Food Insecurity: Patient Declined (04/05/2023)   Hunger Vital Sign    Worried About Running Out of Food in the Last Year: Patient declined    Ran Out of Food in the Last Year: Patient declined  Transportation Needs: No Transportation Needs (04/05/2023)   PRAPARE - Administrator, Civil Service (Medical): No    Lack of Transportation (Non-Medical): No  Physical Activity: Unknown (04/05/2023)   Exercise Vital Sign    Days of Exercise per Week: 1 day    Minutes of Exercise per Session: Patient declined  Stress: Patient Declined (04/05/2023)    [employer] of Occupational Health - Occupational Stress Questionnaire    Feeling of Stress : Patient declined  Social Connections: Unknown (04/05/2023)   Social Connection and Isolation Panel [NHANES]    Frequency of Communication with Friends and Family: Patient declined    Frequency of Social Gatherings with Friends and Family: Patient declined    Attends Religious Services: Patient declined    Database administrator or Organizations: Patient declined    Attends Engineer, structural: Not on file    Marital Status: Patient declined     Review of Systems: A 12 point ROS discussed and pertinent positives are indicated in the HPI above.  All other systems are negative.    Vital Signs: BP (!) 125/90   Pulse 91   Temp 97.9 F (36.6 C) (Oral)   Resp (!) 21   SpO2 98%   Advance Care Plan: The advanced care place/surrogate decision maker was discussed at the time of visit and the patient did not wish to discuss or was not able to name a surrogate decision maker or provide an advance care plan.  Physical Exam Vitals reviewed.  Constitutional:      Appearance: Normal appearance.  HENT:     Head: Normocephalic and atraumatic.  Eyes:     Extraocular Movements: Extraocular movements intact.  Cardiovascular:     Rate and Rhythm: Normal rate and regular rhythm.  Pulmonary:     Effort: Pulmonary effort is normal. No respiratory distress.     Breath sounds: Normal breath sounds.  Abdominal:     General: There is no distension.     Palpations: Abdomen is soft.     Tenderness: There is no abdominal tenderness.     Comments: RUQ Abdominal wound (see photo)  Musculoskeletal:        General: Normal range of motion.     Cervical back: Normal range of motion.  Skin:    General: Skin is warm and dry.  Neurological:     General: No focal deficit present.     Mental Status: He is alert and oriented to person, place, and time.  Psychiatric:        Mood and Affect: Mood  normal.        Behavior: Behavior normal.        Thought Content: Thought content  normal.        Judgment: Judgment normal.     Imaging:  CLINICAL DATA:  Abdominal wall abscess, incision and drainage on 11/13, worsening drainage from wound   EXAM: CT ABDOMEN AND PELVIS WITH CONTRAST   TECHNIQUE: Multidetector CT imaging of the abdomen and pelvis was performed using the standard protocol following bolus administration of intravenous contrast.   RADIATION DOSE REDUCTION: This exam was performed according to the departmental dose-optimization program which includes automated exposure control, adjustment of the mA and/or kV according to patient size and/or use of iterative reconstruction technique.   CONTRAST:  80mL OMNIPAQUE IOHEXOL 300 MG/ML  SOLN   COMPARISON:  03/12/2023   FINDINGS: Lower chest: No acute abnormality.   Hepatobiliary: No solid liver abnormality is seen. No gallstones, gallbladder wall thickening, or biliary dilatation.   Pancreas: Unremarkable. No pancreatic ductal dilatation or surrounding inflammatory changes.   Spleen: Normal in size without significant abnormality.   Adrenals/Urinary Tract: Adrenal glands are unremarkable. Kidneys are normal, without renal calculi, solid lesion, or hydronephrosis. Bladder is unremarkable.   Stomach/Bowel: Stomach is within normal limits. Transverse duodenal diverticulum. Appendix appears normal. No evidence of bowel wall thickening, distention, or inflammatory changes. Descending and sigmoid diverticulosis.   Vascular/Lymphatic: Severe aortic atherosclerosis. No enlarged abdominal or pelvic lymph nodes.   Reproductive: No mass or other significant abnormality.   Other: No abdominal wall hernia. Large, complex, thick-walled persistent abdominal wall and perihepatic abscess, which is increased in size compared to prior examination at which time the superficial component overlying the right upper quadrant was  packed with wound dressing material. On today's examination, this dominant component measures 5.5 x 2.7 cm, previously 3.0 x 1.3 cm (series 2, image 24). Elongated perihepatic component measures 10.9 x 2.6 cm, previously 10.4 x 1.5 cm (series 2, image 15).   Musculoskeletal: No acute or significant osseous findings.   IMPRESSION: Large, complex, persistent abdominal wall and perihepatic abscess, which is increased in size compared to prior examination, at which time the superficial component overlying the right upper quadrant was packed with wound dressing material. On today's examination, this dominant component measures 5.5 x 2.7 cm, previously 3.0 x 1.3 cm. Elongated perihepatic component measures 10.9 x 2.6 cm, previously 10.4 x 1.5 cm.   These results will be called to the ordering clinician or representative by the Radiologist Assistant, and communication documented in the PACS or [employer].     Electronically Signed   By: Jearld Lesch M.D.   On: 05/05/2023 15:49  Labs:  CBC: Recent Labs    03/09/23 1645 03/10/23 0337 03/11/23 0444 05/17/23 1241  WBC 14.0* 11.2* 8.5 9.1  HGB 12.8* 11.6* 11.5* 14.4  HCT 39.4 34.3* 34.4* 44.6  PLT 484* 388 397 413*    COAGS: Recent Labs    03/10/23 0902  INR 1.1    BMP: Recent Labs    11/27/22 1103 03/09/23 1645 03/10/23 0337 03/11/23 0444 04/26/23 1352  NA 136 134* 136 135  --   K 4.1 3.5 3.4* 3.4*  --   CL 105 102 105 102  --   CO2 22 23 24 23   --   GLUCOSE 96 141* 111* 113*  --   BUN 14 13 9 16   --   CALCIUM 9.0 8.8* 8.7* 8.4*  --   CREATININE 0.64 0.67 0.58* 0.56* 0.80  GFRNONAA >60 >60 >60 >60  --     LIVER FUNCTION TESTS: Recent Labs    11/27/22 1103 03/09/23 1645  03/10/23 0337  BILITOT  --  0.3 0.4  AST  --  18 15  ALT  --  10 9  ALKPHOS  --  86 78  PROT  --  7.6 6.9  ALBUMIN 3.8 3.6 3.4*    TUMOR MARKERS: No results for input(s): "AFPTM", "CEA", "CA199", "CHROMGRNA" in the last 8760  hours.  Assessment and Plan:  Large, complex, persistent abdominal wall and perihepatic abscess, which is increased in size compared to prior examination, at which time the superficial component overlying the right upper quadrant was packed with wound dressing material...the dominant component measures 5.5 x 2.7 cm, previously 3.0 x 1.3 cm. Elongated perihepatic component measures 10.9 x 2.6 cm,previously 10.4 x 1.5 cm.  Images reviewed by Dr. Archer Asa.  Will proceed with image guided drain placements x 3 - Plan for eventual removal of gallstones using choledochoscope one abscesses have resolved.  Risks and benefits of drain placement discussed with the patient including bleeding, infection, damage to adjacent structures, bowel perforation/fistula connection, and sepsis.  All of the patient's questions were answered, patient is agreeable to proceed. Consent signed and in chart.  Thank you for allowing our service to participate in DERICO MCCREA 's care.  Electronically Signed: Gwynneth Macleod, PA-C   05/17/2023, 1:00 PM      I spent a total of  30 Minutes  in face to face in clinical consultation, greater than 50% of which was counseling/coordinating care for image guided drain placement.

## 2023-05-18 ENCOUNTER — Telehealth: Payer: Self-pay | Admitting: Radiology

## 2023-05-18 ENCOUNTER — Other Ambulatory Visit: Payer: Self-pay

## 2023-05-18 NOTE — Telephone Encounter (Signed)
Called patient to inform him that the saline flushes were sent to the [facility] outpatient pharmacy.

## 2023-05-19 ENCOUNTER — Encounter: Payer: Self-pay | Admitting: Surgery

## 2023-05-19 ENCOUNTER — Ambulatory Visit: Payer: Medicare PPO | Admitting: Surgery

## 2023-05-19 VITALS — BP 145/88 | HR 95 | Temp 97.9°F | Ht 68.0 in | Wt 156.0 lb

## 2023-05-19 DIAGNOSIS — Z09 Encounter for follow-up examination after completed treatment for conditions other than malignant neoplasm: Secondary | ICD-10-CM

## 2023-05-19 DIAGNOSIS — L02211 Cutaneous abscess of abdominal wall: Secondary | ICD-10-CM

## 2023-05-19 NOTE — Patient Instructions (Signed)
Change the gauze around the drains as needed.   Follow up with IR as scheduled for drain management.  Follow up here in 3 weeks.

## 2023-05-20 NOTE — Progress Notes (Signed)
Outpatient Surgical Follow Up  05/20/2023  Joel Flores is an 73 y.o. male.   Chief Complaint  Patient presents with   Routine Post Op    HPI: Joel Flores is 3 1/2 MONTHS out from incision and drainage of complex abdominal wall and perihepatic abscess. I did I/D 10 days ago and also I did order a CT scan that I have personally reviewed showing evidence of continue intra-abdominal abscess, he underwent two drain placements by IR. HE continues to have some purulent draiange but clinincally continues to improve. He is ambulating he has not had any fevers or chills        Past Medical History:  Diagnosis Date   Allergy 1990   Anxiety    PANIC ATTACKS   Arthritis    right shoulder   Atherosclerosis of aorta (HCC) 02/12/2015   Cataract    COPD (chronic obstructive pulmonary disease) (HCC)    Cough    Depression    DM (diabetes mellitus) (HCC) 03/10/2023   Environmental and seasonal allergies    Essential hypertension 02/12/2015   Family history of adverse reaction to anesthesia    Father - 32 - MI, then stroke after Prostate surgery   GERD (gastroesophageal reflux disease)    History of hiatal hernia    Hyperlipidemia    Hypertension    Hypothyroidism    Incisional hernia, without obstruction or gangrene    PVD (peripheral vascular disease) (HCC) 11/17/2016   Thyroid disease    Venous insufficiency    Vertigo    can happen daily   Wears hearing aid in both ears    has, does not wear    Past Surgical History:  Procedure Laterality Date   CATARACT EXTRACTION W/PHACO Right 03/16/2018   Procedure: CATARACT EXTRACTION PHACO AND INTRAOCULAR LENS PLACEMENT (IOC);  Surgeon: Elliot Cousin, MD;  Location: [facility] ORS;  Service: Ophthalmology;  Laterality: Right;  [place]  01:05 CDE 11.85 Fluid pack lot # [id number]   CATARACT EXTRACTION W/PHACO Left 05/08/2020   Procedure: CATARACT EXTRACTION PHACO AND INTRAOCULAR LENS PLACEMENT (IOC) LEFT 7.32 00:54.7 13.4%;  Surgeon: Lockie Mola,  MD;  Location: [facility];  Service: Ophthalmology;  Laterality: Left;   CHOLECYSTECTOMY     COLONOSCOPY  2015   Dr Servando Snare- cleared for 5 years    COLONOSCOPY WITH PROPOFOL N/A 01/17/2018   Procedure: COLONOSCOPY WITH PROPOFOL;  Surgeon: Midge Minium, MD;  Location: [facility];  Service: Endoscopy;  Laterality: N/A;  requests early   HERNIA REPAIR  July 2019   INSERTION OF MESH N/A 08/31/2017   Procedure: INSERTION OF MESH;  Surgeon: Ancil Linsey, MD;  Location: [facility] ORS;  Service: General;  Laterality: N/A;   IRRIGATION AND DEBRIDEMENT ABSCESS N/A 03/10/2023   Procedure: IRRIGATION AND DEBRIDEMENT ABDOMINAL WALL ABSCESS;  Surgeon: Leafy Ro, MD;  Location: [facility] OR[facility];  Service: General;  Laterality: N/A;   POLYPECTOMY  01/17/2018   Procedure: POLYPECTOMY;  Surgeon: Midge Minium, MD;  Location: [facility];  Service: Endoscopy;;   UMBILICAL HERNIA REPAIR N/A 08/31/2017   Procedure: HERNIA REPAIR UMBILICAL ADULT;  Surgeon: Ancil Linsey, MD;  Location: [facility] ORS;  Service: General;  Laterality: N/A;   VEIN SURGERY     2 stents in R) leg    Family History  Problem Relation Age of Onset   Diabetes Mother    Cancer Father    Heart disease Father    Stroke Father     Social History:  reports  that he has been smoking cigarettes. He has a 75 pack-year smoking history. He has been exposed to tobacco smoke. He has never used smokeless tobacco. He reports current alcohol use. He reports that he does not use drugs.  Allergies:  Allergies  Allergen Reactions   Sulfa Antibiotics Rash    Medications reviewed.    ROS Full ROS performed and is otherwise negative other than what is stated in HPI   BP (!) 145/88   Pulse 95   Temp 97.9 F (36.6 C)   Ht 5\' 8"  (1.727 m)   Wt 156 lb (70.8 kg)   SpO2 92%   BMI 23.72 kg/m   Physical Exam Expand All Collapse All  Outpatient Surgical Follow Up     Joel Flores is an 73 y.o. male.       Chief  Complaint  Patient presents with   Routine Post Op      HPI: Joel Flores is 3 MONTHS out from incision and drainage of complex abdominal wall and perihepatic abscess.  Continue to endorse chronic drainage from the wound site.  He was placed on antibiotics and I did some cultures does visit showing fasciola system to ampicillin but sensitive to everything else. I did order a CT scan that I have personally reviewed showing evidence of continue intra-abdominal abscess he was restarted on Cipro and Flagyl and is doing reasonable he is taking p.o. he is ambulating he has not had any fevers or chills        Physical Exam Vitals and nursing note reviewed. Exam conducted with a chaperone present.  Constitutional:      Appearance: Normal appearance. He is not ill-appearing or diaphoretic.  Abdominal:     General: Abdomen is flat. There is no distension.     Palpations: Abdomen is soft. There is no mass.     Tenderness: There is no abdominal tenderness. There is no guarding or rebound.     Hernia: No hernia is present.     Comments: two JP drains w some purulent output, small open wound with packing. No evidence of necrotizing infectiojn Musculoskeletal:        General: No swelling or tenderness. Normal range of motion.     Cervical back: Normal range of motion and neck supple. No rigidity or tenderness.  Skin:    General: Skin is warm and dry.     Capillary Refill: Capillary refill takes less than 2 seconds.  Neurological:     General: No focal deficit present.     Mental Status: He is alert and oriented to person, place, and time.  Psychiatric:        Mood and Affect: Mood normal.        Behavior: Behavior normal.        Thought Content: Thought content normal.        Judgment: Judgment normal.       Assessment/Plan:   73 year old male with recurrent abdominal wall abscess tracking into the liver.  Discussed with the patient in detail.  Fortunately we have draining from the subcutaneous  tissue perspective and we have also placed 2 deep intra-abdominal drains.  I have also discussed with IR the possibility of using a spyglass to blast some of the gallstones that he does have in the intra-abdominal cavity.  We will let interventional radiology perform this procedure.  I will see him back in a couple weeks.  He might require more antibiotic therapy but I will like to hold  off for now.  He is not toxic and does not require hospitalization at this time.  Note that I spent 30 minutes in this encounter including extensive review of medical records, extensive review of imaging studies personally, coordinating his care to include coordination of care with interventional radiology.       [name], MD [facility] General Surgeon

## 2023-05-21 ENCOUNTER — Other Ambulatory Visit: Payer: Self-pay | Admitting: Surgery

## 2023-05-21 DIAGNOSIS — K650 Generalized (acute) peritonitis: Secondary | ICD-10-CM

## 2023-05-22 LAB — AEROBIC/ANAEROBIC CULTURE W GRAM STAIN (SURGICAL/DEEP WOUND)
Culture: NO GROWTH
Culture: NO GROWTH
Special Requests: NORMAL
Special Requests: NORMAL

## 2023-06-01 ENCOUNTER — Other Ambulatory Visit: Payer: Self-pay | Admitting: Surgery

## 2023-06-01 ENCOUNTER — Ambulatory Visit
Admission: RE | Admit: 2023-06-01 | Discharge: 2023-06-01 | Disposition: A | Discharge status: Home / Self Care | Payer: Medicare PPO | Source: Ambulatory Visit | Attending: Physician Assistant | Admitting: Physician Assistant

## 2023-06-01 ENCOUNTER — Ambulatory Visit
Admission: RE | Admit: 2023-06-01 | Discharge: 2023-06-01 | Disposition: A | Discharge status: Home / Self Care | Payer: Medicare PPO | Source: Ambulatory Visit | Attending: Surgery

## 2023-06-01 ENCOUNTER — Telehealth: Payer: Self-pay

## 2023-06-01 ENCOUNTER — Ambulatory Visit
Admission: RE | Admit: 2023-06-01 | Discharge: 2023-06-01 | Disposition: A | Discharge status: Home / Self Care | Payer: Medicare PPO | Source: Ambulatory Visit | Attending: Surgery | Admitting: Surgery

## 2023-06-01 DIAGNOSIS — K650 Generalized (acute) peritonitis: Secondary | ICD-10-CM

## 2023-06-01 DIAGNOSIS — K8020 Calculus of gallbladder without cholecystitis without obstruction: Secondary | ICD-10-CM | POA: Diagnosis not present

## 2023-06-01 DIAGNOSIS — T8143XA Infection following a procedure, organ and space surgical site, initial encounter: Secondary | ICD-10-CM | POA: Diagnosis not present

## 2023-06-01 HISTORY — PX: IR RADIOLOGIST EVAL & MGMT: IMG5224

## 2023-06-01 MED ORDER — IOPAMIDOL (ISOVUE-300) INJECTION 61%
100.0000 mL | Freq: Once | INTRAVENOUS | Status: AC | PRN
  Administered 2023-06-01: 100 mL via INTRAVENOUS

## 2023-06-01 NOTE — Progress Notes (Signed)
 Chief Complaint: Patient was seen in consultation today for abscess drains in place at the request of Almeda Sari Slocumb  Referring Physician(s): Almeda Sari Slocumb  History of Present Illness: Joel Flores is a 73 y.o. male With a history of retained gallstones following cholecystectomy complicated by chronic abscess formation in the right perihepatic space, right retroperitoneal space and right lower quadrant abdominal wall.  He underwent surgical incision and drainage of the abdominal wall abscess followed by placement of percutaneous drainage catheters into the right retroperitoneal and right perihepatic abscesses.  He presents today to the clinic with his wife.  He is feeling well.  He is not currently on antibiotics.  He is flushing each drain with 5 mL saline daily.  Output has dwindled but remains 10 to 20 mL per drain per day and remains milky/cloudy in appearance.  Multiple small black flecks have been coming out into the JP drains.  His incision and drainage is healing.  He has not had output from this for the last several days.  He denies fever, chills or abdominal pain.  Past Medical History:  Diagnosis Date   Allergy 1990   Anxiety    PANIC ATTACKS   Arthritis    right shoulder   Atherosclerosis of aorta (HCC) 02/12/2015   Cataract    COPD (chronic obstructive pulmonary disease) (HCC)    Cough    Depression    DM (diabetes mellitus) (HCC) 03/10/2023   Environmental and seasonal allergies    Essential hypertension 02/12/2015   Family history of adverse reaction to anesthesia    Father - 70 - MI, then stroke after Prostate surgery   GERD (gastroesophageal reflux disease)    History of hiatal hernia    Hyperlipidemia    Hypertension    Hypothyroidism    Incisional hernia, without obstruction or gangrene    PVD (peripheral vascular disease) (HCC) 11/17/2016   Thyroid  disease    Venous insufficiency    Vertigo    can happen daily   Wears hearing aid in  both ears    has, does not wear    Past Surgical History:  Procedure Laterality Date   CATARACT EXTRACTION W/PHACO Right 03/16/2018   Procedure: CATARACT EXTRACTION PHACO AND INTRAOCULAR LENS PLACEMENT (IOC);  Surgeon: Ferol Rogue, MD;  Location: [facility];  Service: Ophthalmology;  Laterality: Right;  US   01:05 CDE 11.85 Fluid pack lot # [id number]   CATARACT EXTRACTION W/PHACO Left 05/08/2020   Procedure: CATARACT EXTRACTION PHACO AND INTRAOCULAR LENS PLACEMENT (IOC) LEFT 7.32 00:54.7 13.4%;  Surgeon: Mittie Gaskin, MD;  Location: [facility];  Service: Ophthalmology;  Laterality: Left;   CHOLECYSTECTOMY     COLONOSCOPY  2015   Dr Jinny- cleared for 5 years    COLONOSCOPY WITH PROPOFOL  N/A 01/17/2018   Procedure: COLONOSCOPY WITH PROPOFOL ;  Surgeon: Jinny Carmine, MD;  Location: [facility];  Service: Endoscopy;  Laterality: N/A;  requests early   HERNIA REPAIR  July 2019   INSERTION OF MESH N/A 08/31/2017   Procedure: INSERTION OF MESH;  Surgeon: Nicholaus Selinda Birmingham, MD;  Location: [facility];  Service: General;  Laterality: N/A;   IR RADIOLOGIST EVAL & MGMT  06/01/2023   IRRIGATION AND DEBRIDEMENT ABSCESS N/A 03/10/2023   Procedure: IRRIGATION AND DEBRIDEMENT ABDOMINAL WALL ABSCESS;  Surgeon: Jordis Laneta FALCON, MD;  Location: [facility];  Service: General;  Laterality: N/A;   POLYPECTOMY  01/17/2018   Procedure: POLYPECTOMY;  Surgeon: Jinny Carmine, MD;  Location: [facility];  Service: Endoscopy;;   UMBILICAL HERNIA REPAIR N/A 08/31/2017   Procedure: HERNIA REPAIR UMBILICAL ADULT;  Surgeon: Nicholaus Selinda Birmingham, MD;  Location: [facility];  Service: General;  Laterality: N/A;   VEIN SURGERY     2 stents in R) leg    Allergies: Sulfa antibiotics  Medications: Prior to Admission medications   Medication Sig Start Date End Date Taking? Authorizing Provider  acetaminophen  (TYLENOL ) 325 MG tablet Take 325 mg by mouth daily as needed for moderate pain or headache.     [provider]  albuterol  (VENTOLIN  HFA) 108 (90 Base) MCG/ACT inhaler Inhale into the lungs. Inhale 2 inhalations into the lungs 4 (four) times daily PRN 05/26/21   [provider]  amLODipine  (NORVASC ) 5 MG tablet Take 5 mg by mouth daily.    [provider]  azelastine  (ASTELIN ) 0.1 % nasal spray Place 2 sprays into both nostrils 2 (two) times daily. Use in each nostril as directed 11/03/21   Joshua Cathryne BROCKS, MD  cetirizine  (ZYRTEC ) 10 MG tablet Take 1 tablet (10 mg total) by mouth daily. 11/27/22   Joshua Cathryne BROCKS, MD  clopidogrel  (PLAVIX ) 75 MG tablet Take 1 tablet (75 mg total) by mouth daily. 04/05/23   Joshua Cathryne BROCKS, MD  fluticasone  (FLONASE ) 50 MCG/ACT nasal spray SPRAY 2 SPRAYS INTO EACH NOSTRIL EVERY DAY 04/05/23   Jones, Deanna C, MD  ibuprofen (ADVIL,MOTRIN) 200 MG tablet Take 400 mg by mouth every 8 (eight) hours as needed (for pain).    [provider]  levothyroxine  (SYNTHROID ) 112 MCG tablet Take 1 tablet (112 mcg total) by mouth daily. 04/05/23   Joshua Cathryne BROCKS, MD  losartan  (COZAAR ) 100 MG tablet Take 100 mg by mouth daily.    [provider]  metFORMIN  (GLUCOPHAGE ) 500 MG tablet TAKE 1 TABLET BY MOUTH EVERY DAY WITH BREAKFAST 04/05/23   Joshua Cathryne BROCKS, MD  pantoprazole  (PROTONIX ) 40 MG tablet Take 1 tablet (40 mg total) by mouth daily. 04/05/23   Joshua Cathryne BROCKS, MD  rosuvastatin  (CRESTOR ) 40 MG tablet Take 1 tablet (40 mg total) by mouth daily. 04/05/23   Joshua Cathryne BROCKS, MD  sertraline  (ZOLOFT ) 100 MG tablet Take 1 tablet (100 mg total) by mouth daily. 04/05/23   Joshua Cathryne BROCKS, MD  sodium chloride  flush (NS) 0.9 % SOLN Flush each drain once a day with 5 mL of normal saline. 05/17/23   Caperilla, Daved SAILOR, PA     Family History  Problem Relation Age of Onset   Diabetes Mother    Cancer Father    Heart disease Father    Stroke Father     Social History   Socioeconomic History   Marital status: Married    Spouse name: Not on  file   Number of children: 3   Years of education: Not on file   Highest education level: 12th grade  Occupational History   Occupation: retired  Tobacco Use   Smoking status: Every Day    Current packs/day: 1.50    Average packs/day: 1.5 packs/day for 50.0 years (75.0 ttl pk-yrs)    Types: Cigarettes    Passive exposure: Past   Smokeless tobacco: Never  Vaping Use   Vaping status: Never Used  Substance and Sexual Activity   Alcohol use: Yes    Comment: once a month   Drug use: No   Sexual activity: Not Currently  Other Topics Concern   Not on file  Social History Narrative   Not on  file   Social Drivers of Health   Financial Resource Strain: Patient Declined (04/05/2023)   Overall Financial Resource Strain (CARDIA)    Difficulty of Paying Living Expenses: Patient declined  Food Insecurity: Patient Declined (04/05/2023)   Hunger Vital Sign    Worried About Running Out of Food in the Last Year: Patient declined    Ran Out of Food in the Last Year: Patient declined  Transportation Needs: No Transportation Needs (04/05/2023)   PRAPARE - Administrator, Civil Service (Medical): No    Lack of Transportation (Non-Medical): No  Physical Activity: Unknown (04/05/2023)   Exercise Vital Sign    Days of Exercise per Week: 1 day    Minutes of Exercise per Session: Patient declined  Stress: Patient Declined (04/05/2023)   [employer] of Occupational Health - Occupational Stress Questionnaire    Feeling of Stress : Patient declined  Social Connections: Unknown (04/05/2023)   Social Connection and Isolation Panel [NHANES]    Frequency of Communication with Friends and Family: Patient declined    Frequency of Social Gatherings with Friends and Family: Patient declined    Attends Religious Services: Patient declined    Database Administrator or Organizations: Patient declined    Attends Engineer, Structural: Not on file    Marital Status: Patient declined     Review of Systems: A 12 point ROS discussed and pertinent positives are indicated in the HPI above.  All other systems are negative.  Review of Systems  Vital Signs: There were no vitals taken for this visit.  Advance Care Plan: The advanced care plan/surrogate decision maker was discussed at the time of visit and the patient did not wish to discuss or was not able to name a surrogate decision maker or provide an advance care plan.    Physical Exam Constitutional:      General: He is not in acute distress.    Appearance: Normal appearance. He is normal weight.  HENT:     Head: Normocephalic and atraumatic.  Eyes:     General: No scleral icterus. Cardiovascular:     Rate and Rhythm: Normal rate.  Pulmonary:     Effort: Pulmonary effort is normal.  Abdominal:     General: Abdomen is flat. There is no distension.     Palpations: Abdomen is soft.     Tenderness: There is no abdominal tenderness.       Comments: Healing I&D site, no obvious induration or fluctuance.  Both drain sites look good.  The more superior is clean and dry with minimal crusty exudate.  The more inferior is mildly erythematous.   Neurological:     Mental Status: He is alert.       Imaging: IR Radiologist Eval & Mgmt Result Date: 06/01/2023 EXAM: ESTABLISHED PATIENT OFFICE VISIT CHIEF COMPLAINT: SEE NOTE IN EPIC HISTORY OF PRESENT ILLNESS: SEE NOTE IN EPIC REVIEW OF SYSTEMS: SEE NOTE IN EPIC PHYSICAL EXAMINATION: SEE NOTE IN EPIC ASSESSMENT AND PLAN: SEE NOTE IN EPIC Electronically Signed   By: Wilkie Lent M.D.   On: 06/01/2023 11:58   CT ABDOMEN PELVIS W CONTRAST Result Date: 06/01/2023 CLINICAL DATA:  73 year old male with a history of recurrent intra-abdominal abscesses due to retained gallstones following cholecystectomy performed more than 5 years previously. He had percutaneous drains placed on 05/17/2023 and presents today for follow-up evaluation. EXAM: CT ABDOMEN AND PELVIS WITH CONTRAST  TECHNIQUE: Multidetector CT imaging of the abdomen and pelvis was performed using  the standard protocol following bolus administration of intravenous contrast. RADIATION DOSE REDUCTION: This exam was performed according to the departmental dose-optimization program which includes automated exposure control, adjustment of the mA and/or kV according to patient size and/or use of iterative reconstruction technique. CONTRAST:  ISOVUE -300 IOPAMIDOL  (ISOVUE -300) INJECTION 61% COMPARISON:  Prior CT scan of the abdomen and pelvis 04/26/2023 FINDINGS: Lower chest: No acute abnormality. Hepatobiliary: No focal liver abnormality is seen. Status post cholecystectomy. No biliary dilatation. Pancreas: Unremarkable. No pancreatic ductal dilatation or surrounding inflammatory changes. Spleen: Normal in size without focal abnormality. Adrenals/Urinary Tract: Adrenal glands are unremarkable. Kidneys are normal, without renal calculi, focal lesion, or hydronephrosis. Bladder is unremarkable. Stomach/Bowel: Colonic diverticular disease without CT evidence of active inflammation. No evidence of obstruction or focal bowel wall thickening. Normal appendix in the right lower quadrant. The terminal ileum is unremarkable. Vascular/Lymphatic: Extensive calcified atherosclerotic plaque. No evidence of aneurysm or dissection. No suspicious lymphadenopathy. Reproductive: Prostate is unremarkable. Other: Small residual fluid collection in the superficial abdominal wall at the site of prior I and D measures approximately 2.2 x 2.4 x 0.8 cm (volume = 2 cm^3). No residual abscess visualized at either of the 2 prior percutaneous drain sites. Both drains remain in good position. Interestingly, the radiopaque gallstones seen on the prior study are no longer evident. Musculoskeletal: No acute fracture or aggressive appearing lytic or blastic osseous lesion. IMPRESSION: 1. Significant improvement in multifocal right retroperitoneal and abdominal  wall abscesses. 2. There is a small residual fluid collection at the site of prior incision and drainage in the superficial abdominal wall measuring approximately 2 mL in volume. 3. The retroperitoneal and perihepatic collections are essentially completely resolved. 4. Interestingly, the radiopaque gallstone seen on the prior CT scan are no longer evident. Electronically Signed   By: Wilkie Lent M.D.   On: 06/01/2023 11:56   CT GUIDED PERITONEAL/RETROPERITONEAL FLUID DRAIN BY PERC CATH Result Date: 05/17/2023 INDICATION: 73 year old male with recurrent intra-abdominal abscesses due to retained gallstones following cholecystectomy performed more than 5 years ago. He presents for percutaneous drain placement. EXAM: CT-GUIDED DRAIN PLACEMENT CT-GUIDED DRAIN PLACEMENT MEDICATIONS: None. ANESTHESIA/SEDATION: Moderate (conscious) sedation was employed during this procedure. A total of Versed  2 mg and Fentanyl  100 mcg was administered intravenously by the radiology nurse. Total intra-service moderate Sedation Time: 14 minutes. The patient's level of consciousness and vital signs were monitored continuously by radiology nursing throughout the procedure under my direct supervision. COMPLICATIONS: None immediate. PROCEDURE: Informed written consent was obtained from the patient after a thorough discussion of the procedural risks, benefits and alternatives. All questions were addressed. Maximal Sterile Barrier Technique was utilized including caps, mask, sterile gowns, sterile gloves, sterile drape, hand hygiene and skin antiseptic. A timeout was performed prior to the initiation of the procedure. A planning axial CT scan was performed. The perihepatic fluid collection containing several dependently layering gallstones was identified. Additionally, the more inferior fluid collection in the right hemiabdomen along the abdominal wall also containing gallstones was identified. Suitable skin entry sites for both drain  approaches were marked. The skin was sterilely prepped and draped in the standard fashion using chlorhexidine  skin prep. Local anesthesia at each site was attained by infiltration with 1% lidocaine  and small dermatotomy is were made. Under intermittent CT guidance, 18 gauge trocar needles were carefully advanced into both fluid collections. 0.035 wire was coiled in the fluid collections. The percutaneous tract were dilated to 12 French. Separate 12 French drainage catheters were advanced over each wire  and into the 2 separate abscess collections. Aspiration from the right perihepatic collection yields 15 mL of thick bloody purulent fluid. Aspiration from the right abdominal wall fluid collection yields 5 mL of thick purulent fluid. Both drains were flushed with saline and the drainage catheter was connected to JP bulb suction. Drains were secured in place with 0 Prolene suture. IMPRESSION: 1. Successful placement of 12 French drain into the right perihepatic abscess collection with aspiration of 15 mL bloody. A fluid sample was sent for Gram stain and culture. 2. Successful placement of a 12 French drain into the abscess collection in the right mid abdomen along the abdominal wall. A fluid sample was sent for Gram stain and culture. Electronically Signed   By: Wilkie Lent M.D.   On: 05/17/2023 15:36   CT GUIDED PERITONEAL/RETROPERITONEAL FLUID DRAIN BY PERC CATH Result Date: 05/17/2023 INDICATION: 73 year old male with recurrent intra-abdominal abscesses due to retained gallstones following cholecystectomy performed more than 5 years ago. He presents for percutaneous drain placement. EXAM: CT-GUIDED DRAIN PLACEMENT CT-GUIDED DRAIN PLACEMENT MEDICATIONS: None. ANESTHESIA/SEDATION: Moderate (conscious) sedation was employed during this procedure. A total of Versed  2 mg and Fentanyl  100 mcg was administered intravenously by the radiology nurse. Total intra-service moderate Sedation Time: 14 minutes. The  patient's level of consciousness and vital signs were monitored continuously by radiology nursing throughout the procedure under my direct supervision. COMPLICATIONS: None immediate. PROCEDURE: Informed written consent was obtained from the patient after a thorough discussion of the procedural risks, benefits and alternatives. All questions were addressed. Maximal Sterile Barrier Technique was utilized including caps, mask, sterile gowns, sterile gloves, sterile drape, hand hygiene and skin antiseptic. A timeout was performed prior to the initiation of the procedure. A planning axial CT scan was performed. The perihepatic fluid collection containing several dependently layering gallstones was identified. Additionally, the more inferior fluid collection in the right hemiabdomen along the abdominal wall also containing gallstones was identified. Suitable skin entry sites for both drain approaches were marked. The skin was sterilely prepped and draped in the standard fashion using chlorhexidine  skin prep. Local anesthesia at each site was attained by infiltration with 1% lidocaine  and small dermatotomy is were made. Under intermittent CT guidance, 18 gauge trocar needles were carefully advanced into both fluid collections. 0.035 wire was coiled in the fluid collections. The percutaneous tract were dilated to 12 French. Separate 12 French drainage catheters were advanced over each wire and into the 2 separate abscess collections. Aspiration from the right perihepatic collection yields 15 mL of thick bloody purulent fluid. Aspiration from the right abdominal wall fluid collection yields 5 mL of thick purulent fluid. Both drains were flushed with saline and the drainage catheter was connected to JP bulb suction. Drains were secured in place with 0 Prolene suture. IMPRESSION: 1. Successful placement of 12 French drain into the right perihepatic abscess collection with aspiration of 15 mL bloody. A fluid sample was sent for  Gram stain and culture. 2. Successful placement of a 12 French drain into the abscess collection in the right mid abdomen along the abdominal wall. A fluid sample was sent for Gram stain and culture. Electronically Signed   By: Wilkie Lent M.D.   On: 05/17/2023 15:36    Labs:  CBC: Recent Labs    03/09/23 1645 03/10/23 0337 03/11/23 0444 05/17/23 1241  WBC 14.0* 11.2* 8.5 9.1  HGB 12.8* 11.6* 11.5* 14.4  HCT 39.4 34.3* 34.4* 44.6  PLT 484* 388 397 413*  COAGS: Recent Labs    03/10/23 0902 05/17/23 1241  INR 1.1 1.1    BMP: Recent Labs    11/27/22 1103 03/09/23 1645 03/10/23 0337 03/11/23 0444 04/26/23 1352  NA 136 134* 136 135  --   K 4.1 3.5 3.4* 3.4*  --   CL 105 102 105 102  --   CO2 22 23 24 23   --   GLUCOSE 96 141* 111* 113*  --   BUN 14 13 9 16   --   CALCIUM  9.0 8.8* 8.7* 8.4*  --   CREATININE 0.64 0.67 0.58* 0.56* 0.80  GFRNONAA >60 >60 >60 >60  --     LIVER FUNCTION TESTS: Recent Labs    11/27/22 1103 03/09/23 1645 03/10/23 0337  BILITOT  --  0.3 0.4  AST  --  18 15  ALT  --  10 9  ALKPHOS  --  86 78  PROT  --  7.6 6.9  ALBUMIN 3.8 3.6 3.4*    TUMOR MARKERS: No results for input(s): AFPTM, CEA, CA199, CHROMGRNA in the last 8760 hours.  Assessment and Plan:  Very pleasant 73 year old gentleman with 2 percutaneous drainage catheters in place for recurrent abscesses in the setting of retained gallstones.  Both clinically and by imaging, his abscesses are essentially completely resolved.  Further, the previously identified high attenuation gallstones are no longer visible on CT imaging.  He and his wife have been noting small dark flecks coming out with the drainage from the tubes.  This almost certainly represents fragmentation of the underlying gallstones and successful drainage.  Given his improvement I think we may be able to resolve this without an additional spyglass procedure.  I advised them to pursue a more aggressive  flushing strategy.  They are to flush 10 cc into each drainage catheter at least twice per day.  There is 1 small residual abscess collection at the site of the prior incision and drainage which is markedly better compared to the last CT scan but may represent a site that could require an additional aspiration or procedure.  We will monitor this closely.  1.) Repeat CT abd with contrast and return clinic visit in 1 week.    Electronically Signed: Wilkie MARLA Lent 06/01/2023, 12:01 PM   I spent a total of  15 Minutes in face to face in clinical consultation, greater than 50% of which was counseling/coordinating care for retained gallstones, abscesses and abscess drains in place.

## 2023-06-01 NOTE — Telephone Encounter (Signed)
 Notified patient as instructed, patient pleased. Discussed follow-up appointments, patient agrees

## 2023-06-01 NOTE — Telephone Encounter (Signed)
-----   Message from [facility] sent at 06/01/2023  1:26 PM EST ----- Please let him know his CT looks much better ----- Message ----- From: Interface, Rad Results In Sent: 06/01/2023  11:58 AM EST To: Leafy Ro, MD

## 2023-06-02 ENCOUNTER — Other Ambulatory Visit: Payer: Self-pay | Admitting: Family Medicine

## 2023-06-02 DIAGNOSIS — J301 Allergic rhinitis due to pollen: Secondary | ICD-10-CM

## 2023-06-03 ENCOUNTER — Other Ambulatory Visit: Payer: Self-pay | Admitting: Student

## 2023-06-03 ENCOUNTER — Other Ambulatory Visit: Payer: Self-pay

## 2023-06-03 MED ORDER — SODIUM CHLORIDE 0.9% FLUSH: INTRAVENOUS | 3 refills | Status: DC

## 2023-06-03 MED ORDER — SODIUM CHLORIDE 0.9% FLUSH: 10.0000 mL | Freq: Two times a day (BID) | INTRAVENOUS | Status: AC

## 2023-06-03 MED ORDER — NORMAL SALINE FLUSH 0.9 % IV SOLN
INTRAVENOUS | 3 refills | Status: DC
  Filled 2023-06-03: qty 600, 15d supply, fill #0

## 2023-06-03 MED ORDER — SODIUM CHLORIDE 0.9% FLUSH
INTRAVENOUS | 3 refills | Status: DC
  Filled 2023-06-03: qty 300, 30d supply, fill #0

## 2023-06-03 NOTE — Telephone Encounter (Signed)
 Refill of saline flushes called into [facility] for patient, 10 mL syringes #[id number].   Kyngston Pickelsimer, MS RD PA-C

## 2023-06-04 ENCOUNTER — Other Ambulatory Visit: Payer: Self-pay

## 2023-06-08 ENCOUNTER — Ambulatory Visit
Admission: RE | Admit: 2023-06-08 | Discharge: 2023-06-08 | Disposition: A | Discharge status: Home / Self Care | Payer: Medicare PPO | Source: Ambulatory Visit | Attending: Surgery

## 2023-06-08 ENCOUNTER — Telehealth: Payer: Self-pay

## 2023-06-08 DIAGNOSIS — E119 Type 2 diabetes mellitus without complications: Secondary | ICD-10-CM | POA: Diagnosis not present

## 2023-06-08 DIAGNOSIS — I10 Essential (primary) hypertension: Secondary | ICD-10-CM | POA: Diagnosis not present

## 2023-06-08 DIAGNOSIS — J449 Chronic obstructive pulmonary disease, unspecified: Secondary | ICD-10-CM | POA: Diagnosis not present

## 2023-06-08 DIAGNOSIS — K650 Generalized (acute) peritonitis: Secondary | ICD-10-CM

## 2023-06-08 HISTORY — PX: IR RADIOLOGIST EVAL & MGMT: IMG5224

## 2023-06-08 MED ORDER — IOPAMIDOL (ISOVUE-300) INJECTION 61%
100.0000 mL | Freq: Once | INTRAVENOUS | Status: AC | PRN
  Administered 2023-06-08: 100 mL via INTRAVENOUS

## 2023-06-08 NOTE — Telephone Encounter (Signed)
-----   Message from [facility] sent at 06/08/2023  2:41 PM EST ----- Please let him know CT looks good ----- Message ----- From: Interface, Rad Results In Sent: 06/08/2023  12:14 PM EST To: Leafy Ro, MD

## 2023-06-08 NOTE — Progress Notes (Signed)
Chief Complaint: Patient was seen in consultation today for abscess drains in place.   History of Present Illness: Joel Flores is a 73 y.o. male with a history of retained gallstones following cholecystectomy complicated by chronic abscess formation in the right perihepatic space, right retroperitoneal space and right lower quadrant abdominal wall.   He underwent surgical incision and drainage of the abdominal wall abscess followed by placement of percutaneous drainage catheters into the right retroperitoneal and right perihepatic abscesses.   He presents today to the clinic with his wife.  Continues to do extremely well.  He has had no further output of black stone fragments.  He has been copiously flushing and the output has become increasingly clear in nature.  He has no abdominal pain, fevers, chills or other clinical issues.   Past Medical History:  Diagnosis Date   Allergy 1990   Anxiety    PANIC ATTACKS   Arthritis    right shoulder   Atherosclerosis of aorta (HCC) 02/12/2015   Cataract    COPD (chronic obstructive pulmonary disease) (HCC)    Cough    Depression    DM (diabetes mellitus) (HCC) 03/10/2023   Environmental and seasonal allergies    Essential hypertension 02/12/2015   Family history of adverse reaction to anesthesia    Father - 65 - MI, then stroke after Prostate surgery   GERD (gastroesophageal reflux disease)    History of hiatal hernia    Hyperlipidemia    Hypertension    Hypothyroidism    Incisional hernia, without obstruction or gangrene    PVD (peripheral vascular disease) (HCC) 11/17/2016   Thyroid disease    Venous insufficiency    Vertigo    can happen daily   Wears hearing aid in both ears    has, does not wear    Past Surgical History:  Procedure Laterality Date   CATARACT EXTRACTION W/PHACO Right 03/16/2018   Procedure: CATARACT EXTRACTION PHACO AND INTRAOCULAR LENS PLACEMENT (IOC);  Surgeon: Elliot Cousin, MD;  Location: [facility]  ORS;  Service: Ophthalmology;  Laterality: Right;  [place]  01:05 CDE 11.85 Fluid pack lot # [id number]   CATARACT EXTRACTION W/PHACO Left 05/08/2020   Procedure: CATARACT EXTRACTION PHACO AND INTRAOCULAR LENS PLACEMENT (IOC) LEFT 7.32 00:54.7 13.4%;  Surgeon: Lockie Mola, MD;  Location: [facility];  Service: Ophthalmology;  Laterality: Left;   CHOLECYSTECTOMY     COLONOSCOPY  2015   Dr Servando Snare- cleared for 5 years    COLONOSCOPY WITH PROPOFOL N/A 01/17/2018   Procedure: COLONOSCOPY WITH PROPOFOL;  Surgeon: Midge Minium, MD;  Location: [facility];  Service: Endoscopy;  Laterality: N/A;  requests early   HERNIA REPAIR  July 2019   INSERTION OF MESH N/A 08/31/2017   Procedure: INSERTION OF MESH;  Surgeon: Ancil Linsey, MD;  Location: [facility] ORS;  Service: General;  Laterality: N/A;   IR RADIOLOGIST EVAL & MGMT  06/01/2023   IR RADIOLOGIST EVAL & MGMT  06/08/2023   IRRIGATION AND DEBRIDEMENT ABSCESS N/A 03/10/2023   Procedure: IRRIGATION AND DEBRIDEMENT ABDOMINAL WALL ABSCESS;  Surgeon: Leafy Ro, MD;  Location: [facility] ORS;  Service: General;  Laterality: N/A;   POLYPECTOMY  01/17/2018   Procedure: POLYPECTOMY;  Surgeon: Midge Minium, MD;  Location: [facility];  Service: Endoscopy;;   UMBILICAL HERNIA REPAIR N/A 08/31/2017   Procedure: HERNIA REPAIR UMBILICAL ADULT;  Surgeon: Ancil Linsey, MD;  Location: [facility] ORS;  Service: General;  Laterality: N/A;   VEIN SURGERY  2 stents in R) leg    Allergies: Sulfa antibiotics  Medications: Prior to Admission medications   Medication Sig Start Date End Date Taking? Authorizing Provider  acetaminophen (TYLENOL) 325 MG tablet Take 325 mg by mouth daily as needed for moderate pain or headache.    [provider]  albuterol (VENTOLIN HFA) 108 (90 Base) MCG/ACT inhaler Inhale into the lungs. Inhale 2 inhalations into the lungs 4 (four) times daily PRN 05/26/21   [provider]  amLODipine (NORVASC)  5 MG tablet Take 5 mg by mouth daily.    [provider]  azelastine (ASTELIN) 0.1 % nasal spray Place 2 sprays into both nostrils 2 (two) times daily. Use in each nostril as directed 11/03/21   Duanne Limerick, MD  cetirizine (ZYRTEC) 10 MG tablet TAKE 1 TABLET BY MOUTH EVERY DAY 06/03/23   Duanne Limerick, MD  clopidogrel (PLAVIX) 75 MG tablet Take 1 tablet (75 mg total) by mouth daily. 04/05/23   Duanne Limerick, MD  fluticasone (FLONASE) 50 MCG/ACT nasal spray SPRAY 2 SPRAYS INTO EACH NOSTRIL EVERY DAY 04/05/23   Duanne Limerick, MD  ibuprofen (ADVIL,MOTRIN) 200 MG tablet Take 400 mg by mouth every 8 (eight) hours as needed (for pain).    [provider]  levothyroxine (SYNTHROID) 112 MCG tablet Take 1 tablet (112 mcg total) by mouth daily. 04/05/23   Duanne Limerick, MD  losartan (COZAAR) 100 MG tablet Take 100 mg by mouth daily.    [provider]  metFORMIN (GLUCOPHAGE) 500 MG tablet TAKE 1 TABLET BY MOUTH EVERY DAY WITH BREAKFAST 04/05/23   Duanne Limerick, MD  pantoprazole (PROTONIX) 40 MG tablet Take 1 tablet (40 mg total) by mouth daily. 04/05/23   Duanne Limerick, MD  rosuvastatin (CRESTOR) 40 MG tablet Take 1 tablet (40 mg total) by mouth daily. 04/05/23   Duanne Limerick, MD  sertraline (ZOLOFT) 100 MG tablet Take 1 tablet (100 mg total) by mouth daily. 04/05/23   Duanne Limerick, MD  Sodium Chloride Flush (NORMAL SALINE FLUSH) 0.9 % SOLN Flush each drain with 10 ml of normal saline two times a day. 06/03/23   Hoyt Koch, PA  sodium chloride flush (NS) 0.9 % SOLN Flush each drain once a day with 10 mL of normal saline. 06/03/23   Hoyt Koch, PA     Family History  Problem Relation Age of Onset   Diabetes Mother    Cancer Father    Heart disease Father    Stroke Father     Social History   Socioeconomic History   Marital status: Married    Spouse name: Not on file   Number of children: 3   Years of education: Not on file   Highest  education level: 12th grade  Occupational History   Occupation: retired  Tobacco Use   Smoking status: Every Day    Current packs/day: 1.50    Average packs/day: 1.5 packs/day for 50.0 years (75.0 ttl pk-yrs)    Types: Cigarettes    Passive exposure: Past   Smokeless tobacco: Never  Vaping Use   Vaping status: Never Used  Substance and Sexual Activity   Alcohol use: Yes    Comment: once a month   Drug use: No   Sexual activity: Not Currently  Other Topics Concern   Not on file  Social History Narrative   Not on file   Social Drivers of Health   Financial Resource Strain:  Patient Declined (04/05/2023)   Overall Financial Resource Strain (CARDIA)    Difficulty of Paying Living Expenses: Patient declined  Food Insecurity: Patient Declined (04/05/2023)   Hunger Vital Sign    Worried About Running Out of Food in the Last Year: Patient declined    Ran Out of Food in the Last Year: Patient declined  Transportation Needs: No Transportation Needs (04/05/2023)   PRAPARE - Administrator, Civil Service (Medical): No    Lack of Transportation (Non-Medical): No  Physical Activity: Unknown (04/05/2023)   Exercise Vital Sign    Days of Exercise per Week: 1 day    Minutes of Exercise per Session: Patient declined  Stress: Patient Declined (04/05/2023)   [employer] of Occupational Health - Occupational Stress Questionnaire    Feeling of Stress : Patient declined  Social Connections: Unknown (04/05/2023)   Social Connection and Isolation Panel [NHANES]    Frequency of Communication with Friends and Family: Patient declined    Frequency of Social Gatherings with Friends and Family: Patient declined    Attends Religious Services: Patient declined    Database administrator or Organizations: Patient declined    Attends Engineer, structural: Not on file    Marital Status: Patient declined    Review of Systems: A 12 point ROS discussed and pertinent positives are  indicated in the HPI above.  All other systems are negative.  Review of Systems  Vital Signs: There were no vitals taken for this visit.  Physical Exam Constitutional:      General: He is not in acute distress.    Appearance: Normal appearance.  HENT:     Head: Normocephalic and atraumatic.  Eyes:     General: No scleral icterus. Cardiovascular:     Rate and Rhythm: Normal rate.  Pulmonary:     Effort: Pulmonary effort is normal.  Abdominal:     General: There is no distension.     Palpations: Abdomen is soft.     Tenderness: There is no abdominal tenderness. There is no guarding.  Skin:    General: Skin is warm and dry.  Neurological:     Mental Status: He is alert and oriented to person, place, and time.  Psychiatric:        Mood and Affect: Mood normal.        Behavior: Behavior normal.      Imaging: IR Radiologist Eval & Mgmt Result Date: 06/08/2023 EXAM: ESTABLISHED PATIENT OFFICE VISIT CHIEF COMPLAINT: SEE NOTE IN EPIC HISTORY OF PRESENT ILLNESS: SEE NOTE IN EPIC REVIEW OF SYSTEMS: SEE NOTE IN EPIC PHYSICAL EXAMINATION: SEE NOTE IN EPIC ASSESSMENT AND PLAN: SEE NOTE IN EPIC Electronically Signed   By: Malachy Moan M.D.   On: 06/08/2023 12:39   CT ABDOMEN W CONTRAST Result Date: 06/08/2023 CLINICAL DATA:  73 year old male with a history of recurrent intra-abdominal abscesses due to retained gallstones following cholecystectomy performed more than 5 years previously. He had percutaneous drains placed on 05/17/2023 and presents today for follow-up evaluation. EXAM: CT ABDOMEN WITH CONTRAST TECHNIQUE: Multidetector CT imaging of the abdomen was performed using the standard protocol following bolus administration of intravenous contrast. RADIATION DOSE REDUCTION: This exam was performed according to the departmental dose-optimization program which includes automated exposure control, adjustment of the mA and/or kV according to patient size and/or use of iterative  reconstruction technique. CONTRAST:  ISOVUE-300 IOPAMIDOL (ISOVUE-300) INJECTION 61% COMPARISON:  Most recent prior CT scan of the abdomen and pelvis  06/01/2023 FINDINGS: Lower chest: No acute abnormality. Hepatobiliary: No focal liver abnormality is seen. Status post cholecystectomy. No biliary dilatation. Pancreas: Unremarkable. No pancreatic ductal dilatation or surrounding inflammatory changes. Spleen: Normal in size without focal abnormality. Adrenals/Urinary Tract: Adrenal glands are unremarkable. Kidneys are normal, without renal calculi, focal lesion, or hydronephrosis. Bladder is unremarkable. Stomach/Bowel: Colonic diverticular disease without CT evidence of active inflammation. No evidence of obstruction or focal bowel wall thickening. Normal appendix in the right lower quadrant. The terminal ileum is unremarkable. Vascular/Lymphatic: Extensive calcified atherosclerotic plaque. No evidence of aneurysm or dissection. No suspicious lymphadenopathy. Reproductive: Prostate is unremarkable. Other: Small residual fluid collection in the superficial abdominal wall at the site of prior I and D has essentially completely resolved. No measurable fluid remains. No residual abscess visualized at either of the 2 prior percutaneous drain sites. Both drains remain in good position. No radiopaque gallstones visualized. Musculoskeletal: No acute fracture or aggressive appearing lytic or blastic osseous lesion. IMPRESSION: 1. Well-positioned drainage catheters with no evidence of residual undrained abscess. 2. No radiopaque gallstones are visualized. Electronically Signed   By: Malachy Moan M.D.   On: 06/08/2023 12:12   IR Radiologist Eval & Mgmt Result Date: 06/01/2023 EXAM: ESTABLISHED PATIENT OFFICE VISIT CHIEF COMPLAINT: SEE NOTE IN EPIC HISTORY OF PRESENT ILLNESS: SEE NOTE IN EPIC REVIEW OF SYSTEMS: SEE NOTE IN EPIC PHYSICAL EXAMINATION: SEE NOTE IN EPIC ASSESSMENT AND PLAN: SEE NOTE IN EPIC  Electronically Signed   By: Malachy Moan M.D.   On: 06/01/2023 11:58   CT ABDOMEN PELVIS W CONTRAST Result Date: 06/01/2023 CLINICAL DATA:  73 year old male with a history of recurrent intra-abdominal abscesses due to retained gallstones following cholecystectomy performed more than 5 years previously. He had percutaneous drains placed on 05/17/2023 and presents today for follow-up evaluation. EXAM: CT ABDOMEN AND PELVIS WITH CONTRAST TECHNIQUE: Multidetector CT imaging of the abdomen and pelvis was performed using the standard protocol following bolus administration of intravenous contrast. RADIATION DOSE REDUCTION: This exam was performed according to the departmental dose-optimization program which includes automated exposure control, adjustment of the mA and/or kV according to patient size and/or use of iterative reconstruction technique. CONTRAST:  ISOVUE-300 IOPAMIDOL (ISOVUE-300) INJECTION 61% COMPARISON:  Prior CT scan of the abdomen and pelvis 04/26/2023 FINDINGS: Lower chest: No acute abnormality. Hepatobiliary: No focal liver abnormality is seen. Status post cholecystectomy. No biliary dilatation. Pancreas: Unremarkable. No pancreatic ductal dilatation or surrounding inflammatory changes. Spleen: Normal in size without focal abnormality. Adrenals/Urinary Tract: Adrenal glands are unremarkable. Kidneys are normal, without renal calculi, focal lesion, or hydronephrosis. Bladder is unremarkable. Stomach/Bowel: Colonic diverticular disease without CT evidence of active inflammation. No evidence of obstruction or focal bowel wall thickening. Normal appendix in the right lower quadrant. The terminal ileum is unremarkable. Vascular/Lymphatic: Extensive calcified atherosclerotic plaque. No evidence of aneurysm or dissection. No suspicious lymphadenopathy. Reproductive: Prostate is unremarkable. Other: Small residual fluid collection in the superficial abdominal wall at the site of prior I and D  measures approximately 2.2 x 2.4 x 0.8 cm (volume = 2 cm^3). No residual abscess visualized at either of the 2 prior percutaneous drain sites. Both drains remain in good position. Interestingly, the radiopaque gallstones seen on the prior study are no longer evident. Musculoskeletal: No acute fracture or aggressive appearing lytic or blastic osseous lesion. IMPRESSION: 1. Significant improvement in multifocal right retroperitoneal and abdominal wall abscesses. 2. There is a small residual fluid collection at the site of prior incision and drainage in the superficial  abdominal wall measuring approximately 2 mL in volume. 3. The retroperitoneal and perihepatic collections are essentially completely resolved. 4. Interestingly, the radiopaque gallstone seen on the prior CT scan are no longer evident. Electronically Signed   By: Malachy Moan M.D.   On: 06/01/2023 11:56   CT GUIDED PERITONEAL/RETROPERITONEAL FLUID DRAIN BY PERC CATH Result Date: 05/17/2023 INDICATION: 73 year old male with recurrent intra-abdominal abscesses due to retained gallstones following cholecystectomy performed more than 5 years ago. He presents for percutaneous drain placement. EXAM: CT-GUIDED DRAIN PLACEMENT CT-GUIDED DRAIN PLACEMENT MEDICATIONS: None. ANESTHESIA/SEDATION: Moderate (conscious) sedation was employed during this procedure. A total of Versed 2 mg and Fentanyl 100 mcg was administered intravenously by the radiology nurse. Total intra-service moderate Sedation Time: 14 minutes. The patient's level of consciousness and vital signs were monitored continuously by radiology nursing throughout the procedure under my direct supervision. COMPLICATIONS: None immediate. PROCEDURE: Informed written consent was obtained from the patient after a thorough discussion of the procedural risks, benefits and alternatives. All questions were addressed. Maximal Sterile Barrier Technique was utilized including caps, mask, sterile gowns, sterile  gloves, sterile drape, hand hygiene and skin antiseptic. A timeout was performed prior to the initiation of the procedure. A planning axial CT scan was performed. The perihepatic fluid collection containing several dependently layering gallstones was identified. Additionally, the more inferior fluid collection in the right hemiabdomen along the abdominal wall also containing gallstones was identified. Suitable skin entry sites for both drain approaches were marked. The skin was sterilely prepped and draped in the standard fashion using chlorhexidine skin prep. Local anesthesia at each site was attained by infiltration with 1% lidocaine and small dermatotomy is were made. Under intermittent CT guidance, 18 gauge trocar needles were carefully advanced into both fluid collections. 0.035 wire was coiled in the fluid collections. The percutaneous tract were dilated to 12 [place]. Separate 12 French drainage catheters were advanced over each wire and into the 2 separate abscess collections. Aspiration from the right perihepatic collection yields 15 mL of thick bloody purulent fluid. Aspiration from the right abdominal wall fluid collection yields 5 mL of thick purulent fluid. Both drains were flushed with saline and the drainage catheter was connected to JP bulb suction. Drains were secured in place with 0 Prolene suture. IMPRESSION: 1. Successful placement of 12 French drain into the right perihepatic abscess collection with aspiration of 15 mL bloody. A fluid sample was sent for Gram stain and culture. 2. Successful placement of a 12 French drain into the abscess collection in the right mid abdomen along the abdominal wall. A fluid sample was sent for Gram stain and culture. Electronically Signed   By: Malachy Moan M.D.   On: 05/17/2023 15:36   CT GUIDED PERITONEAL/RETROPERITONEAL FLUID DRAIN BY PERC CATH Result Date: 05/17/2023 INDICATION: 73 year old male with recurrent intra-abdominal abscesses due to retained  gallstones following cholecystectomy performed more than 5 years ago. He presents for percutaneous drain placement. EXAM: CT-GUIDED DRAIN PLACEMENT CT-GUIDED DRAIN PLACEMENT MEDICATIONS: None. ANESTHESIA/SEDATION: Moderate (conscious) sedation was employed during this procedure. A total of Versed 2 mg and Fentanyl 100 mcg was administered intravenously by the radiology nurse. Total intra-service moderate Sedation Time: 14 minutes. The patient's level of consciousness and vital signs were monitored continuously by radiology nursing throughout the procedure under my direct supervision. COMPLICATIONS: None immediate. PROCEDURE: Informed written consent was obtained from the patient after a thorough discussion of the procedural risks, benefits and alternatives. All questions were addressed. Maximal Sterile Barrier Technique was utilized including  caps, mask, sterile gowns, sterile gloves, sterile drape, hand hygiene and skin antiseptic. A timeout was performed prior to the initiation of the procedure. A planning axial CT scan was performed. The perihepatic fluid collection containing several dependently layering gallstones was identified. Additionally, the more inferior fluid collection in the right hemiabdomen along the abdominal wall also containing gallstones was identified. Suitable skin entry sites for both drain approaches were marked. The skin was sterilely prepped and draped in the standard fashion using chlorhexidine skin prep. Local anesthesia at each site was attained by infiltration with 1% lidocaine and small dermatotomy is were made. Under intermittent CT guidance, 18 gauge trocar needles were carefully advanced into both fluid collections. 0.035 wire was coiled in the fluid collections. The percutaneous tract were dilated to 12 [place]. Separate 12 French drainage catheters were advanced over each wire and into the 2 separate abscess collections. Aspiration from the right perihepatic collection yields 15  mL of thick bloody purulent fluid. Aspiration from the right abdominal wall fluid collection yields 5 mL of thick purulent fluid. Both drains were flushed with saline and the drainage catheter was connected to JP bulb suction. Drains were secured in place with 0 Prolene suture. IMPRESSION: 1. Successful placement of 12 French drain into the right perihepatic abscess collection with aspiration of 15 mL bloody. A fluid sample was sent for Gram stain and culture. 2. Successful placement of a 12 French drain into the abscess collection in the right mid abdomen along the abdominal wall. A fluid sample was sent for Gram stain and culture. Electronically Signed   By: Malachy Moan M.D.   On: 05/17/2023 15:36    Labs:  CBC: Recent Labs    03/09/23 1645 03/10/23 0337 03/11/23 0444 05/17/23 1241  WBC 14.0* 11.2* 8.5 9.1  HGB 12.8* 11.6* 11.5* 14.4  HCT 39.4 34.3* 34.4* 44.6  PLT 484* 388 397 413*    COAGS: Recent Labs    03/10/23 0902 05/17/23 1241  INR 1.1 1.1    BMP: Recent Labs    11/27/22 1103 03/09/23 1645 03/10/23 0337 03/11/23 0444 04/26/23 1352  NA 136 134* 136 135  --   K 4.1 3.5 3.4* 3.4*  --   CL 105 102 105 102  --   CO2 22 23 24 23   --   GLUCOSE 96 141* 111* 113*  --   BUN 14 13 9 16   --   CALCIUM 9.0 8.8* 8.7* 8.4*  --   CREATININE 0.64 0.67 0.58* 0.56* 0.80  GFRNONAA >60 >60 >60 >60  --     LIVER FUNCTION TESTS: Recent Labs    11/27/22 1103 03/09/23 1645 03/10/23 0337  BILITOT  --  0.3 0.4  AST  --  18 15  ALT  --  10 9  ALKPHOS  --  86 78  PROT  --  7.6 6.9  ALBUMIN 3.8 3.6 3.4*    TUMOR MARKERS: No results for input(s): "AFPTM", "CEA", "CA199", "CHROMGRNA" in the last 8760 hours.  Assessment and Plan:  73 year old gentleman with a history of recurrent intra-abdominal abscesses due to retained gallstones.  Percutaneous drains were placed and have been followed.  Imaging shows that his abscesses are completely resolved.  Additionally, the  retained radiopaque gallstones have fragmented and appear to have come out through the drainage catheters.  No visible gallstones are seen on CT imaging.  He is doing clinically well and drain output is minimal.  Therefore, both drains were removed.  No further scheduled  follow-up.    Electronically Signed: [name] 06/08/2023, 2:08 PM   I spent a total of  15 Minutes in face to face in clinical consultation, greater than 50% of which was counseling/coordinating care for abscess drains in place.

## 2023-06-08 NOTE — Telephone Encounter (Signed)
Notified patient as instructed, patient pleased. Discussed follow-up appointments, patient agrees

## 2023-06-14 ENCOUNTER — Ambulatory Visit: Payer: Medicare PPO | Admitting: Surgery

## 2023-06-14 ENCOUNTER — Encounter: Payer: Self-pay | Admitting: Surgery

## 2023-06-14 VITALS — BP 155/85 | HR 118 | Temp 97.9°F | Ht 68.0 in | Wt 150.8 lb

## 2023-06-14 DIAGNOSIS — L02211 Cutaneous abscess of abdominal wall: Secondary | ICD-10-CM | POA: Diagnosis not present

## 2023-06-14 DIAGNOSIS — R1900 Intra-abdominal and pelvic swelling, mass and lump, unspecified site: Secondary | ICD-10-CM

## 2023-06-14 DIAGNOSIS — Z09 Encounter for follow-up examination after completed treatment for conditions other than malignant neoplasm: Secondary | ICD-10-CM | POA: Diagnosis not present

## 2023-06-14 NOTE — Patient Instructions (Signed)
 We have scheduled you for a CT Scan of your Abdomen and Pelvis with contrast. This has been scheduled at [facility] on [address] on 06/15/2023 . Please arrive there by 2:45pm . If you need to reschedule your Scan, you may do so by calling [phone]. Please let us know if you reschedule your scan as we have to get authorization from your insurance for this.

## 2023-06-14 NOTE — Progress Notes (Unsigned)
 Outpatient Surgical Follow Up  06/14/2023  Joel Flores is an 73 y.o. male.   Chief Complaint  Patient presents with  . Follow-up    Abdominal wall abscess 05/10/23    HPI: ***  Past Medical History:  Diagnosis Date  . Allergy 1990  . Anxiety    PANIC ATTACKS  . Arthritis    right shoulder  . Atherosclerosis of aorta (HCC) 02/12/2015  . Cataract   . COPD (chronic obstructive pulmonary disease) (HCC)   . Cough   . Depression   . DM (diabetes mellitus) (HCC) 03/10/2023  . Environmental and seasonal allergies   . Essential hypertension 02/12/2015  . Family history of adverse reaction to anesthesia    Father - 20 - MI, then stroke after Prostate surgery  . GERD (gastroesophageal reflux disease)   . History of hiatal hernia   . Hyperlipidemia   . Hypertension   . Hypothyroidism   . Incisional hernia, without obstruction or gangrene   . PVD (peripheral vascular disease) (HCC) 11/17/2016  . Thyroid disease   . Venous insufficiency   . Vertigo    can happen daily  . Wears hearing aid in both ears    has, does not wear    Past Surgical History:  Procedure Laterality Date  . CATARACT EXTRACTION W/PHACO Right 03/16/2018   Procedure: CATARACT EXTRACTION PHACO AND INTRAOCULAR LENS PLACEMENT (IOC);  Surgeon: Elliot Cousin, MD;  Location: [facility];  Service: Ophthalmology;  Laterality: Right;  [place]  01:05 CDE 11.85 Fluid pack lot # [id number]  . CATARACT EXTRACTION W/PHACO Left 05/08/2020   Procedure: CATARACT EXTRACTION PHACO AND INTRAOCULAR LENS PLACEMENT (IOC) LEFT 7.32 00:54.7 13.4%;  Surgeon: Lockie Mola, MD;  Location: [facility];  Service: Ophthalmology;  Laterality: Left;  . CHOLECYSTECTOMY    . COLONOSCOPY  2015   Dr Servando Snare- cleared for 5 years   . COLONOSCOPY WITH PROPOFOL N/A 01/17/2018   Procedure: COLONOSCOPY WITH PROPOFOL;  Surgeon: Midge Minium, MD;  Location: [facility];  Service: Endoscopy;  Laterality: N/A;  requests early  . HERNIA  REPAIR  July 2019  . INSERTION OF MESH N/A 08/31/2017   Procedure: INSERTION OF MESH;  Surgeon: Ancil Linsey, MD;  Location: [facility];  Service: General;  Laterality: N/A;  . IR RADIOLOGIST EVAL & MGMT  06/01/2023  . IR RADIOLOGIST EVAL & MGMT  06/08/2023  . IRRIGATION AND DEBRIDEMENT ABSCESS N/A 03/10/2023   Procedure: IRRIGATION AND DEBRIDEMENT ABDOMINAL WALL ABSCESS;  Surgeon: Leafy Ro, MD;  Location: [facility];  Service: General;  Laterality: N/A;  . POLYPECTOMY  01/17/2018   Procedure: POLYPECTOMY;  Surgeon: Midge Minium, MD;  Location: [facility];  Service: Endoscopy;;  . UMBILICAL HERNIA REPAIR N/A 08/31/2017   Procedure: HERNIA REPAIR UMBILICAL ADULT;  Surgeon: Ancil Linsey, MD;  Location: [facility];  Service: General;  Laterality: N/A;  . VEIN SURGERY     2 stents in R) leg    Family History  Problem Relation Age of Onset  . Diabetes Mother   . Cancer Father   . Heart disease Father   . Stroke Father     Social History:  reports that he has been smoking cigarettes. He has a 75 pack-year smoking history. He has been exposed to tobacco smoke. He has never used smokeless tobacco. He reports current alcohol use. He reports that he does not use drugs.  Allergies:  Allergies  Allergen Reactions  . Sulfa Antibiotics Rash  Medications reviewed.    ROS Full ROS performed and is otherwise negative other than what is stated in HPI   BP (!) 155/85   Pulse (!) 118   Temp 97.9 F (36.6 C) (Oral)   Ht 5\' 8"  (1.727 m)   Wt 150 lb 12.8 oz (68.4 kg)   SpO2 97%   BMI 22.93 kg/m   Physical Exam    Assessment/Plan:   [name], MD [facility] General Surgeon

## 2023-06-15 ENCOUNTER — Ambulatory Visit
Admission: RE | Admit: 2023-06-15 | Discharge: 2023-06-15 | Disposition: A | Discharge status: Home / Self Care | Payer: Medicare PPO | Source: Ambulatory Visit | Attending: Surgery | Admitting: Surgery

## 2023-06-15 DIAGNOSIS — R10811 Right upper quadrant abdominal tenderness: Secondary | ICD-10-CM | POA: Diagnosis not present

## 2023-06-15 DIAGNOSIS — R1900 Intra-abdominal and pelvic swelling, mass and lump, unspecified site: Secondary | ICD-10-CM | POA: Insufficient documentation

## 2023-06-15 DIAGNOSIS — L02211 Cutaneous abscess of abdominal wall: Secondary | ICD-10-CM | POA: Diagnosis not present

## 2023-06-15 DIAGNOSIS — K750 Abscess of liver: Secondary | ICD-10-CM | POA: Diagnosis not present

## 2023-06-15 DIAGNOSIS — K5730 Diverticulosis of large intestine without perforation or abscess without bleeding: Secondary | ICD-10-CM | POA: Diagnosis not present

## 2023-06-15 LAB — POCT I-STAT CREATININE: Creatinine, Ser: 0.6 mg/dL — ABNORMAL LOW (ref 0.61–1.24)

## 2023-06-15 MED ORDER — IOHEXOL 300 MG/ML  SOLN
100.0000 mL | Freq: Once | INTRAMUSCULAR | Status: AC | PRN
  Administered 2023-06-15: 100 mL via INTRAVENOUS

## 2023-06-16 ENCOUNTER — Other Ambulatory Visit: Payer: Self-pay

## 2023-06-16 DIAGNOSIS — L02211 Cutaneous abscess of abdominal wall: Secondary | ICD-10-CM

## 2023-06-16 MED ORDER — METRONIDAZOLE 500 MG PO TABS
500.0000 mg | ORAL_TABLET | Freq: Three times a day (TID) | ORAL | 0 refills | Status: AC
Start: 2023-06-16 — End: 2023-06-26

## 2023-06-16 MED ORDER — CIPROFLOXACIN HCL 500 MG PO TABS: 500.0000 mg | ORAL_TABLET | Freq: Two times a day (BID) | ORAL | 0 refills | Status: AC

## 2023-06-18 ENCOUNTER — Other Ambulatory Visit (HOSPITAL_COMMUNITY): Payer: Self-pay | Admitting: Student

## 2023-06-18 DIAGNOSIS — L02211 Cutaneous abscess of abdominal wall: Secondary | ICD-10-CM

## 2023-06-18 NOTE — Progress Notes (Signed)
 Patient for CT guided Abd abscess drains on Monday 06/21/23, I called and spoke with the patient on the phone and gave pre-procedure instructions. Pt was made aware to be here at 10a, NPO after MN prior to procedure as well as driver post procedure/recovery/discharge. Pt stated understanding.  Called 06/17/23  Per Dr Archer Asa it is ok for the patient to hold his Plavix for only 3 days for this procedure.

## 2023-06-21 ENCOUNTER — Other Ambulatory Visit: Payer: Self-pay | Admitting: Surgery

## 2023-06-21 ENCOUNTER — Ambulatory Visit
Admission: RE | Admit: 2023-06-21 | Discharge: 2023-06-21 | Disposition: A | Discharge status: Home / Self Care | Payer: Medicare PPO | Source: Ambulatory Visit | Attending: Surgery | Admitting: Surgery

## 2023-06-21 DIAGNOSIS — L02211 Cutaneous abscess of abdominal wall: Secondary | ICD-10-CM | POA: Insufficient documentation

## 2023-06-21 DIAGNOSIS — K651 Peritoneal abscess: Secondary | ICD-10-CM | POA: Diagnosis not present

## 2023-06-21 DIAGNOSIS — Z9889 Other specified postprocedural states: Secondary | ICD-10-CM | POA: Diagnosis not present

## 2023-06-21 LAB — CBC
HCT: 42.3 % (ref 39.0–52.0)
Hemoglobin: 13.9 g/dL (ref 13.0–17.0)
MCH: 28 pg (ref 26.0–34.0)
MCHC: 32.9 g/dL (ref 30.0–36.0)
MCV: 85.1 fL (ref 80.0–100.0)
Platelets: 415 10*3/uL — ABNORMAL HIGH (ref 150–400)
RBC: 4.97 MIL/uL (ref 4.22–5.81)
RDW: 14.9 % (ref 11.5–15.5)
WBC: 7.6 10*3/uL (ref 4.0–10.5)
nRBC: 0 % (ref 0.0–0.2)

## 2023-06-21 LAB — PROTIME-INR
INR: 1.1 (ref 0.8–1.2)
Prothrombin Time: 14.3 s (ref 11.4–15.2)

## 2023-06-21 MED ORDER — SODIUM CHLORIDE 0.9% FLUSH: 5.0000 mL | Freq: Three times a day (TID) | INTRAVENOUS | Status: DC

## 2023-06-21 MED ORDER — MIDAZOLAM HCL 2 MG/2ML IJ SOLN
INTRAMUSCULAR | Status: AC
  Filled 2023-06-21: qty 2

## 2023-06-21 MED ORDER — LIDOCAINE 1 % OPTIME INJ - NO CHARGE
10.0000 mL | Freq: Once | INTRAMUSCULAR | Status: AC
  Administered 2023-06-21: 10 mL via SUBCUTANEOUS
  Filled 2023-06-21: qty 10

## 2023-06-21 MED ORDER — SODIUM CHLORIDE 0.9 % IV SOLN: INTRAVENOUS | Status: DC

## 2023-06-21 MED ORDER — FENTANYL CITRATE (PF) 100 MCG/2ML IJ SOLN
INTRAMUSCULAR | Status: AC
Start: 2023-06-21 — End: 2023-06-21
  Filled 2023-06-21: qty 2

## 2023-06-21 MED ORDER — MIDAZOLAM HCL 2 MG/2ML IJ SOLN
INTRAMUSCULAR | Status: DC | PRN
  Administered 2023-06-21 (×2): .5 mg via INTRAVENOUS
  Administered 2023-06-21: 1 mg via INTRAVENOUS

## 2023-06-21 MED ORDER — FENTANYL CITRATE (PF) 100 MCG/2ML IJ SOLN
INTRAMUSCULAR | Status: DC | PRN
  Administered 2023-06-21 (×2): 25 ug via INTRAVENOUS
  Administered 2023-06-21: 50 ug via INTRAVENOUS

## 2023-06-21 NOTE — H&P (Signed)
 Chief Complaint: Patient was seen in consultation today for abdominal wall abscess with consideration for drainage.  Referring Provider(s): Dr. [name], MD  Supervising Physician: Malachy Moan  Patient Status: [facility] - Out-pt  Patient is Full Code  History of Present Illness: Joel Flores is a 73 y.o. male with PMHx notable for HLD, HTN, PVD, DM, COPD, thyroid disease, arthritis, GERD, anxiety, depression, and vertigo.  Patient is know to IR service, having most recently undergone placement of 2 separate right abdominal drains on 1/20 by Dr. Archer Asa.  Per Dr. Hurman Horn progress note on 2/17: 73 year old male with recurrent abdominal wall abscess tracking into the liver now w  resolution but with some fullness. Discussed with the patient in detail.  I do think given fullness we will obtain CT A/P. I will see him back in a couple weeks.  He might require more antibiotic therapy but I will like to hold off for now. He is not toxic and does not require hospitalization at this time.   CT AP w/co on 2/18: IMPRESSION: 1. Interim removal of right-sided percutaneous drainage catheters. Increased size/reaccumulation of right Peri hepatic thick-walled fluid collection/abscess with small calcifications, measuring up to 9.1 cm. Increased size of thick walled rim enhancing fluid collection/abscess slightly more caudal and within the superficial abdominal wall, measuring up to 5.1 cm. 2. Diverticular disease of sigmoid colon without definite inflammatory process. 3. Aortic atherosclerosis.  Interventional Radiology was requested for abdominal wall abscess drainage. Request was reviewed and approved by Dr. Archer Asa. Patient is scheduled for same in IR today.  All labs and medications are within acceptable parameters. No pertinent allergies. Patient has been NPO since midnight.    Currently without any significant complaints. Patient alert and laying in bed,calm. Denies any  fevers, headache, chest pain, SOB, cough, abdominal pain, nausea, vomiting or bleeding.      Past Medical History:  Diagnosis Date   Allergy 1990   Anxiety    PANIC ATTACKS   Arthritis    right shoulder   Atherosclerosis of aorta (HCC) 02/12/2015   Cataract    COPD (chronic obstructive pulmonary disease) (HCC)    Cough    Depression    DM (diabetes mellitus) (HCC) 03/10/2023   Environmental and seasonal allergies    Essential hypertension 02/12/2015   Family history of adverse reaction to anesthesia    Father - 22 - MI, then stroke after Prostate surgery   GERD (gastroesophageal reflux disease)    History of hiatal hernia    Hyperlipidemia    Hypertension    Hypothyroidism    Incisional hernia, without obstruction or gangrene    PVD (peripheral vascular disease) (HCC) 11/17/2016   Thyroid disease    Venous insufficiency    Vertigo    can happen daily   Wears hearing aid in both ears    has, does not wear    Past Surgical History:  Procedure Laterality Date   CATARACT EXTRACTION W/PHACO Right 03/16/2018   Procedure: CATARACT EXTRACTION PHACO AND INTRAOCULAR LENS PLACEMENT (IOC);  Surgeon: Elliot Cousin, MD;  Location: [facility];  Service: Ophthalmology;  Laterality: Right;  [place]  01:05 CDE 11.85 Fluid pack lot # [id number]   CATARACT EXTRACTION W/PHACO Left 05/08/2020   Procedure: CATARACT EXTRACTION PHACO AND INTRAOCULAR LENS PLACEMENT (IOC) LEFT 7.32 00:54.7 13.4%;  Surgeon: Lockie Mola, MD;  Location: [facility];  Service: Ophthalmology;  Laterality: Left;   CHOLECYSTECTOMY     COLONOSCOPY  2015   Dr  Wohl- cleared for 5 years    COLONOSCOPY WITH PROPOFOL N/A 01/17/2018   Procedure: COLONOSCOPY WITH PROPOFOL;  Surgeon: Midge Minium, MD;  Location: [facility];  Service: Endoscopy;  Laterality: N/A;  requests early   HERNIA REPAIR  July 2019   INSERTION OF MESH N/A 08/31/2017   Procedure: INSERTION OF MESH;  Surgeon: Ancil Linsey, MD;   Location: [facility];  Service: General;  Laterality: N/A;   IR RADIOLOGIST EVAL & MGMT  06/01/2023   IR RADIOLOGIST EVAL & MGMT  06/08/2023   IRRIGATION AND DEBRIDEMENT ABSCESS N/A 03/10/2023   Procedure: IRRIGATION AND DEBRIDEMENT ABDOMINAL WALL ABSCESS;  Surgeon: Leafy Ro, MD;  Location: [facility];  Service: General;  Laterality: N/A;   POLYPECTOMY  01/17/2018   Procedure: POLYPECTOMY;  Surgeon: Midge Minium, MD;  Location: [facility];  Service: Endoscopy;;   UMBILICAL HERNIA REPAIR N/A 08/31/2017   Procedure: HERNIA REPAIR UMBILICAL ADULT;  Surgeon: Ancil Linsey, MD;  Location: [facility];  Service: General;  Laterality: N/A;   VEIN SURGERY     2 stents in R) leg    Allergies: Sulfa antibiotics  Medications: Prior to Admission medications   Medication Sig Start Date End Date Taking? Authorizing Provider  acetaminophen (TYLENOL) 325 MG tablet Take 325 mg by mouth daily as needed for moderate pain or headache.    [provider]  albuterol (VENTOLIN HFA) 108 (90 Base) MCG/ACT inhaler Inhale into the lungs. Inhale 2 inhalations into the lungs 4 (four) times daily PRN 05/26/21   [provider]  amLODipine (NORVASC) 5 MG tablet Take 5 mg by mouth daily.    [provider]  azelastine (ASTELIN) 0.1 % nasal spray Place 2 sprays into both nostrils 2 (two) times daily. Use in each nostril as directed 11/03/21   Duanne Limerick, MD  cetirizine (ZYRTEC) 10 MG tablet TAKE 1 TABLET BY MOUTH EVERY DAY 06/03/23   Duanne Limerick, MD  ciprofloxacin (CIPRO) 500 MG tablet Take 1 tablet (500 mg total) by mouth 2 (two) times daily for 10 days. 06/16/23 06/26/23  Pabon, Merri Ray, MD  clopidogrel (PLAVIX) 75 MG tablet Take 1 tablet (75 mg total) by mouth daily. 04/05/23   Duanne Limerick, MD  fluticasone (FLONASE) 50 MCG/ACT nasal spray SPRAY 2 SPRAYS INTO EACH NOSTRIL EVERY DAY 04/05/23   Duanne Limerick, MD  ibuprofen (ADVIL,MOTRIN) 200 MG tablet Take 400 mg by mouth every 8  (eight) hours as needed (for pain).    [provider]  levothyroxine (SYNTHROID) 112 MCG tablet Take 1 tablet (112 mcg total) by mouth daily. 04/05/23   Duanne Limerick, MD  losartan (COZAAR) 100 MG tablet Take 100 mg by mouth daily.    [provider]  metFORMIN (GLUCOPHAGE) 500 MG tablet TAKE 1 TABLET BY MOUTH EVERY DAY WITH BREAKFAST 04/05/23   Duanne Limerick, MD  metroNIDAZOLE (FLAGYL) 500 MG tablet Take 1 tablet (500 mg total) by mouth 3 (three) times daily for 10 days. 06/16/23 06/26/23  Pabon, Merri Ray, MD  pantoprazole (PROTONIX) 40 MG tablet Take 1 tablet (40 mg total) by mouth daily. 04/05/23   Duanne Limerick, MD  rosuvastatin (CRESTOR) 40 MG tablet Take 1 tablet (40 mg total) by mouth daily. 04/05/23   Duanne Limerick, MD  sertraline (ZOLOFT) 100 MG tablet Take 1 tablet (100 mg total) by mouth daily. 04/05/23   Duanne Limerick, MD     Family History  Problem Relation  Age of Onset   Diabetes Mother    Cancer Father    Heart disease Father    Stroke Father     Social History   Socioeconomic History   Marital status: Married    Spouse name: Not on file   Number of children: 3   Years of education: Not on file   Highest education level: 12th grade  Occupational History   Occupation: retired  Tobacco Use   Smoking status: Every Day    Current packs/day: 1.50    Average packs/day: 1.5 packs/day for 50.0 years (75.0 ttl pk-yrs)    Types: Cigarettes    Passive exposure: Past   Smokeless tobacco: Never  Vaping Use   Vaping status: Never Used  Substance and Sexual Activity   Alcohol use: Yes    Comment: once a month   Drug use: No   Sexual activity: Not Currently  Other Topics Concern   Not on file  Social History Narrative   Not on file   Social Drivers of Health   Financial Resource Strain: Patient Declined (04/05/2023)   Overall Financial Resource Strain (CARDIA)    Difficulty of Paying Living Expenses: Patient declined  Food Insecurity: Patient  Declined (04/05/2023)   Hunger Vital Sign    Worried About Running Out of Food in the Last Year: Patient declined    Ran Out of Food in the Last Year: Patient declined  Transportation Needs: No Transportation Needs (04/05/2023)   PRAPARE - Administrator, Civil Service (Medical): No    Lack of Transportation (Non-Medical): No  Physical Activity: Unknown (04/05/2023)   Exercise Vital Sign    Days of Exercise per Week: 1 day    Minutes of Exercise per Session: Patient declined  Stress: Patient Declined (04/05/2023)   [employer] of Occupational Health - Occupational Stress Questionnaire    Feeling of Stress : Patient declined  Social Connections: Unknown (04/05/2023)   Social Connection and Isolation Panel [NHANES]    Frequency of Communication with Friends and Family: Patient declined    Frequency of Social Gatherings with Friends and Family: Patient declined    Attends Religious Services: Patient declined    Database administrator or Organizations: Patient declined    Attends Engineer, structural: Not on file    Marital Status: Patient declined     Review of Systems: A 12 point ROS discussed and pertinent positives are indicated in the HPI above.  All other systems are negative.   Vital Signs: Wt Readings from Last 3 Encounters:  06/14/23 150 lb 12.8 oz (68.4 kg)  05/19/23 156 lb (70.8 kg)  05/10/23 152 lb (68.9 kg)   Temp Readings from Last 3 Encounters:  06/14/23 97.9 F (36.6 C) (Oral)  05/19/23 97.9 F (36.6 C)  05/17/23 97.9 F (36.6 C) (Oral)   BP Readings from Last 3 Encounters:  06/21/23 (!) 150/86  06/14/23 (!) 155/85  05/19/23 (!) 145/88   Pulse Readings from Last 3 Encounters:  06/21/23 89  06/14/23 (!) 118  05/19/23 95       Advance Care Plan: The advanced care place/surrogate decision maker was discussed at the time of visit and the patient did not wish to discuss or was not able to name a surrogate decision maker or provide  an advance care plan.  Physical Exam Vitals reviewed.  Constitutional:      General: He is not in acute distress.    Appearance: Normal appearance.  HENT:  Mouth/Throat:     Mouth: Mucous membranes are moist.  Cardiovascular:     Rate and Rhythm: Normal rate and regular rhythm.     Pulses: Normal pulses.     Heart sounds: No murmur heard. Pulmonary:     Effort: Pulmonary effort is normal. No respiratory distress.     Breath sounds: Normal breath sounds.  Abdominal:     General: Abdomen is flat.     Tenderness: There is abdominal tenderness.     Comments: Minimal; tenderness to palpation at right side abdomen. Superficially draining abscess without erythema, induration, fluctuance, crepitus. Purulent and green discharge noted.  Musculoskeletal:        General: Normal range of motion.     Cervical back: Normal range of motion.  Skin:    General: Skin is warm and dry.  Neurological:     Mental Status: He is alert and oriented to person, place, and time.  Psychiatric:        Mood and Affect: Mood normal.        Behavior: Behavior normal.        Thought Content: Thought content normal.        Judgment: Judgment normal.     Imaging: CT ABDOMEN PELVIS W CONTRAST Result Date: 06/15/2023 CLINICAL DATA:  Right incisional swelling 2 drains in right upper quadrant area that were recently removed, right upper quadrant tenderness EXAM: CT ABDOMEN AND PELVIS WITH CONTRAST TECHNIQUE: Multidetector CT imaging of the abdomen and pelvis was performed using the standard protocol following bolus administration of intravenous contrast. RADIATION DOSE REDUCTION: This exam was performed according to the departmental dose-optimization program which includes automated exposure control, adjustment of the mA and/or kV according to patient size and/or use of iterative reconstruction technique. CONTRAST:  OMNIPAQUE IOHEXOL 300 MG/ML  SOLN COMPARISON:  CT 06/08/2023, 06/01/2023 FINDINGS: Lower  chest: Lung bases demonstrate no acute airspace disease. Stable 4 mm right lung base Peri fissural nodule, no imaging follow-up recommended. Hepatobiliary: Cholecystectomy. No biliary dilatation. No focal hepatic abnormality Pancreas: Unremarkable. No pancreatic ductal dilatation or surrounding inflammatory changes. Spleen: Normal in size without focal abnormality. Adrenals/Urinary Tract: Adrenal glands are unremarkable. Kidneys are normal, without renal calculi, focal lesion, or hydronephrosis. Bladder is unremarkable. Stomach/Bowel: Stomach is within normal limits. Appendix appears normal. No evidence of bowel wall thickening, distention, or inflammatory changes. Diverticular disease of sigmoid colon without definite inflammatory process. Vascular/Lymphatic: Aortic atherosclerosis. No enlarged abdominal or pelvic lymph nodes. Reproductive: Prostate is unremarkable. Other: Negative for pelvic effusion or free air. Interim removal of right sided percutaneous drainage catheters. Increased right Peri hepatic thick-walled fluid collection with small calcifications, this measures approximately 9.1 by 1.4 cm. Increased size of thick walled rim enhancing fluid collection slightly more caudal and within the superficial abdominal wall, this measures 5.1 by 2 cm. Soft tissue thickening tracks to the right lower quadrant. Musculoskeletal: No acute or suspicious osseous abnormality. IMPRESSION: 1. Interim removal of right-sided percutaneous drainage catheters. Increased size/reaccumulation of right Peri hepatic thick-walled fluid collection/abscess with small calcifications, measuring up to 9.1 cm. Increased size of thick walled rim enhancing fluid collection/abscess slightly more caudal and within the superficial abdominal wall, measuring up to 5.1 cm. 2. Diverticular disease of sigmoid colon without definite inflammatory process. 3. Aortic atherosclerosis. Aortic Atherosclerosis (ICD10-I70.0). Electronically Signed   By: Jasmine Pang M.D.   On: 06/15/2023 17:24   IR Radiologist Eval & Mgmt Result Date: 06/08/2023 EXAM: ESTABLISHED PATIENT OFFICE VISIT CHIEF COMPLAINT: SEE NOTE IN  EPIC HISTORY OF PRESENT ILLNESS: SEE NOTE IN EPIC REVIEW OF SYSTEMS: SEE NOTE IN EPIC PHYSICAL EXAMINATION: SEE NOTE IN EPIC ASSESSMENT AND PLAN: SEE NOTE IN EPIC Electronically Signed   By: Malachy Moan M.D.   On: 06/08/2023 12:39   CT ABDOMEN W CONTRAST Result Date: 06/08/2023 CLINICAL DATA:  73 year old male with a history of recurrent intra-abdominal abscesses due to retained gallstones following cholecystectomy performed more than 5 years previously. He had percutaneous drains placed on 05/17/2023 and presents today for follow-up evaluation. EXAM: CT ABDOMEN WITH CONTRAST TECHNIQUE: Multidetector CT imaging of the abdomen was performed using the standard protocol following bolus administration of intravenous contrast. RADIATION DOSE REDUCTION: This exam was performed according to the departmental dose-optimization program which includes automated exposure control, adjustment of the mA and/or kV according to patient size and/or use of iterative reconstruction technique. CONTRAST:  ISOVUE-300 IOPAMIDOL (ISOVUE-300) INJECTION 61% COMPARISON:  Most recent prior CT scan of the abdomen and pelvis 06/01/2023 FINDINGS: Lower chest: No acute abnormality. Hepatobiliary: No focal liver abnormality is seen. Status post cholecystectomy. No biliary dilatation. Pancreas: Unremarkable. No pancreatic ductal dilatation or surrounding inflammatory changes. Spleen: Normal in size without focal abnormality. Adrenals/Urinary Tract: Adrenal glands are unremarkable. Kidneys are normal, without renal calculi, focal lesion, or hydronephrosis. Bladder is unremarkable. Stomach/Bowel: Colonic diverticular disease without CT evidence of active inflammation. No evidence of obstruction or focal bowel wall thickening. Normal appendix in the right lower quadrant. The  terminal ileum is unremarkable. Vascular/Lymphatic: Extensive calcified atherosclerotic plaque. No evidence of aneurysm or dissection. No suspicious lymphadenopathy. Reproductive: Prostate is unremarkable. Other: Small residual fluid collection in the superficial abdominal wall at the site of prior I and D has essentially completely resolved. No measurable fluid remains. No residual abscess visualized at either of the 2 prior percutaneous drain sites. Both drains remain in good position. No radiopaque gallstones visualized. Musculoskeletal: No acute fracture or aggressive appearing lytic or blastic osseous lesion. IMPRESSION: 1. Well-positioned drainage catheters with no evidence of residual undrained abscess. 2. No radiopaque gallstones are visualized. Electronically Signed   By: Malachy Moan M.D.   On: 06/08/2023 12:12   IR Radiologist Eval & Mgmt Result Date: 06/01/2023 EXAM: ESTABLISHED PATIENT OFFICE VISIT CHIEF COMPLAINT: SEE NOTE IN EPIC HISTORY OF PRESENT ILLNESS: SEE NOTE IN EPIC REVIEW OF SYSTEMS: SEE NOTE IN EPIC PHYSICAL EXAMINATION: SEE NOTE IN EPIC ASSESSMENT AND PLAN: SEE NOTE IN EPIC Electronically Signed   By: Malachy Moan M.D.   On: 06/01/2023 11:58   CT ABDOMEN PELVIS W CONTRAST Result Date: 06/01/2023 CLINICAL DATA:  73 year old male with a history of recurrent intra-abdominal abscesses due to retained gallstones following cholecystectomy performed more than 5 years previously. He had percutaneous drains placed on 05/17/2023 and presents today for follow-up evaluation. EXAM: CT ABDOMEN AND PELVIS WITH CONTRAST TECHNIQUE: Multidetector CT imaging of the abdomen and pelvis was performed using the standard protocol following bolus administration of intravenous contrast. RADIATION DOSE REDUCTION: This exam was performed according to the departmental dose-optimization program which includes automated exposure control, adjustment of the mA and/or kV according to patient size and/or use of  iterative reconstruction technique. CONTRAST:  ISOVUE-300 IOPAMIDOL (ISOVUE-300) INJECTION 61% COMPARISON:  Prior CT scan of the abdomen and pelvis 04/26/2023 FINDINGS: Lower chest: No acute abnormality. Hepatobiliary: No focal liver abnormality is seen. Status post cholecystectomy. No biliary dilatation. Pancreas: Unremarkable. No pancreatic ductal dilatation or surrounding inflammatory changes. Spleen: Normal in size without focal abnormality. Adrenals/Urinary Tract: Adrenal glands are unremarkable.  Kidneys are normal, without renal calculi, focal lesion, or hydronephrosis. Bladder is unremarkable. Stomach/Bowel: Colonic diverticular disease without CT evidence of active inflammation. No evidence of obstruction or focal bowel wall thickening. Normal appendix in the right lower quadrant. The terminal ileum is unremarkable. Vascular/Lymphatic: Extensive calcified atherosclerotic plaque. No evidence of aneurysm or dissection. No suspicious lymphadenopathy. Reproductive: Prostate is unremarkable. Other: Small residual fluid collection in the superficial abdominal wall at the site of prior I and D measures approximately 2.2 x 2.4 x 0.8 cm (volume = 2 cm^3). No residual abscess visualized at either of the 2 prior percutaneous drain sites. Both drains remain in good position. Interestingly, the radiopaque gallstones seen on the prior study are no longer evident. Musculoskeletal: No acute fracture or aggressive appearing lytic or blastic osseous lesion. IMPRESSION: 1. Significant improvement in multifocal right retroperitoneal and abdominal wall abscesses. 2. There is a small residual fluid collection at the site of prior incision and drainage in the superficial abdominal wall measuring approximately 2 mL in volume. 3. The retroperitoneal and perihepatic collections are essentially completely resolved. 4. Interestingly, the radiopaque gallstone seen on the prior CT scan are no longer evident. Electronically Signed    By: Malachy Moan M.D.   On: 06/01/2023 11:56    Labs:  CBC: Recent Labs    03/10/23 0337 03/11/23 0444 05/17/23 1241 06/21/23 1033  WBC 11.2* 8.5 9.1 7.6  HGB 11.6* 11.5* 14.4 13.9  HCT 34.3* 34.4* 44.6 42.3  PLT 388 397 413* 415*    COAGS: Recent Labs    03/10/23 0902 05/17/23 1241 06/21/23 1033  INR 1.1 1.1 1.1    BMP: Recent Labs    11/27/22 1103 03/09/23 1645 03/10/23 0337 03/11/23 0444 04/26/23 1352 06/15/23 1454  NA 136 134* 136 135  --   --   K 4.1 3.5 3.4* 3.4*  --   --   CL 105 102 105 102  --   --   CO2 22 23 24 23   --   --   GLUCOSE 96 141* 111* 113*  --   --   BUN 14 13 9 16   --   --   CALCIUM 9.0 8.8* 8.7* 8.4*  --   --   CREATININE 0.64 0.67 0.58* 0.56* 0.80 0.60*  GFRNONAA >60 >60 >60 >60  --   --     LIVER FUNCTION TESTS: Recent Labs    11/27/22 1103 03/09/23 1645 03/10/23 0337  BILITOT  --  0.3 0.4  AST  --  18 15  ALT  --  10 9  ALKPHOS  --  86 78  PROT  --  7.6 6.9  ALBUMIN 3.8 3.6 3.4*    TUMOR MARKERS: No results for input(s): "AFPTM", "CEA", "CA199", "CHROMGRNA" in the last 8760 hours.  Assessment and Plan: Per Dr. Hurman Horn progress note on 2/17: 73 year old male with recurrent abdominal wall abscess tracking into the liver now w  resolution but with some fullness. Discussed with the patient in detail.  I do think given fullness we will obtain CT A/P. I will see him back in a couple weeks.  He might require more antibiotic therapy but I will like to hold off for now. He is not toxic and does not require hospitalization at this time.   CT AP w/co on 2/18: notable for increased size/reaccumulation of right Peri hepatic thick-walled fluid collection/abscess with small calcifications, measuring up to 9.1 cm. Increased size of thick walled rim enhancing fluid collection/abscess slightly more caudal  and within the superficial abdominal wall, measuring up to 5.1 cm.   Per Dr Archer Asa it is ok for the patient to hold his  Plavix for only 3 days for this procedure.   Patient presents for scheduled drainage of abdominal wall abscess in IR today.   Risks and benefits discussed with the patient including bleeding, infection, damage to adjacent structures, bowel perforation/fistula connection, and sepsis.  All of the patient's questions were answered, patient is agreeable to proceed.  Consent signed and in chart.    Thank you for allowing our service to participate in Joel Flores 's care.  Electronically Signed: Sable Feil, PA-C   06/21/2023, 11:21 AM      I spent a total of 25 Minutes in face to face in clinical consultation, greater than 50% of which was counseling/coordinating care for abdominal wall abscess with consideration for drainage.

## 2023-06-21 NOTE — Procedures (Signed)
 Interventional Radiology Procedure Note  Procedure: Placement of 2x 8F drains into the RUQ abscess collections.  Complications: None  Estimated Blood Loss: None  Recommendations: - DC home - Return in 2 weeks for attempted spyglass procedure   Signed,  [name], MD

## 2023-06-24 ENCOUNTER — Other Ambulatory Visit: Payer: Self-pay | Admitting: Interventional Radiology

## 2023-06-24 DIAGNOSIS — K8020 Calculus of gallbladder without cholecystitis without obstruction: Secondary | ICD-10-CM

## 2023-06-28 ENCOUNTER — Ambulatory Visit: Payer: Medicare PPO | Admitting: Surgery

## 2023-06-28 ENCOUNTER — Encounter: Payer: Self-pay | Admitting: Surgery

## 2023-06-28 VITALS — BP 149/84 | HR 106 | Temp 98.2°F | Ht 68.0 in | Wt 150.0 lb

## 2023-06-28 DIAGNOSIS — L02211 Cutaneous abscess of abdominal wall: Secondary | ICD-10-CM

## 2023-06-28 NOTE — Patient Instructions (Signed)
 Follow up here in 2-3 weeks.    Please call and ask to speak with a nurse if you develop questions or concerns.

## 2023-06-30 ENCOUNTER — Other Ambulatory Visit: Payer: Self-pay

## 2023-06-30 ENCOUNTER — Encounter
Admission: RE | Admit: 2023-06-30 | Discharge: 2023-06-30 | Disposition: A | Discharge status: Home / Self Care | Source: Ambulatory Visit | Attending: Interventional Radiology | Admitting: Interventional Radiology

## 2023-06-30 DIAGNOSIS — E119 Type 2 diabetes mellitus without complications: Secondary | ICD-10-CM

## 2023-06-30 HISTORY — DX: Dyspnea, unspecified: R06.00

## 2023-06-30 HISTORY — DX: Angina pectoris, unspecified: I20.9

## 2023-06-30 NOTE — Patient Instructions (Addendum)
 Your procedure is scheduled on: 07/09/23 - Friday Report to the Registration Desk on the 1st floor of the [facility]. Your arrival time 10:00 am If your arrival time is 6:00 am, do not arrive before that time as the [facility] entrance doors do not open until 6:00 am.  REMEMBER: Instructions that are not followed completely may result in serious medical risk, up to and including death; or upon the discretion of your surgeon and anesthesiologist your surgery may need to be rescheduled.  Do not eat food or drink any liquids after midnight the night before surgery.  No gum chewing or hard candies.   One week prior to surgery: Stop Anti-inflammatories (NSAIDS) such as Advil, Aleve, Ibuprofen, Motrin, Naproxen, Naprosyn and Aspirin based products such as Excedrin, Goody's Powder, BC Powder. You may take Tylenol if needed for pain up until the day of surgery.  Stop ANY OVER THE COUNTER supplements until after surgery.  HOLD metFORMIN (GLUCOPHAGE) beginning 07/07/23.  HOLD Plavix beginning 07/04/23.   HOLD Losartan on the day of surgery   ON THE DAY OF SURGERY ONLY TAKE THESE MEDICATIONS WITH SIPS OF WATER:  sertraline (ZOLOFT)  levothyroxine (SYNTHROID)  fluticasone (FLONASE)  amLODipine (NORVASC)   Use inhaler albuterol (VENTOLIN HFA)  on the day of surgery and bring to the hospital.   No Alcohol for 24 hours before or after surgery.  No Smoking including e-cigarettes for 24 hours before surgery.  No chewable tobacco products for at least 6 hours before surgery.  No nicotine patches on the day of surgery.  Do not use any "recreational" drugs for at least a week (preferably 2 weeks) before your surgery.  Please be advised that the combination of cocaine and anesthesia may have negative outcomes, up to and including death. If you test positive for cocaine, your surgery will be cancelled.  On the morning of surgery brush your teeth with toothpaste and water, you may rinse your  mouth with mouthwash if you wish. Do not swallow any toothpaste or mouthwash.  Do not wear jewelry, make-up, hairpins, clips or nail polish.  For welded (permanent) jewelry: bracelets, anklets, waist bands, etc.  Please have this removed prior to surgery.  If it is not removed, there is a chance that hospital personnel will need to cut it off on the day of surgery.  Do not wear lotions, powders, or perfumes.   Do not shave body hair from the neck down 48 hours before surgery.  Contact lenses, hearing aids and dentures may not be worn into surgery.  Do not bring valuables to the hospital. [facility] is not responsible for any missing/lost belongings or valuables.   Notify your doctor if there is any change in your medical condition (cold, fever, infection).  Wear comfortable clothing (specific to your surgery type) to the hospital.  After surgery, you can help prevent lung complications by doing breathing exercises.  Take deep breaths and cough every 1-2 hours. Your doctor may order a device called an Incentive Spirometer to help you take deep breaths. When coughing or sneezing, hold a pillow firmly against your incision with both hands. This is called "splinting." Doing this helps protect your incision. It also decreases belly discomfort.  If you are being admitted to the hospital overnight, leave your suitcase in the car. After surgery it may be brought to your room.  In case of increased patient census, it may be necessary for you, the patient, to continue your postoperative care in the Same  Day Surgery department.  If you are being discharged the day of surgery, you will not be allowed to drive home. You will need a responsible individual to drive you home and stay with you for 24 hours after surgery.   If you are taking public transportation, you will need to have a responsible individual with you.  Please call the Pre-admissions Testing Dept. at [phone] if you have any  questions about these instructions.  Surgery Visitation Policy:  Patients having surgery or a procedure may have two visitors.  Children under the age of 49 must have an adult with them who is not the patient.  Temporary Visitor Restrictions Due to increasing cases of flu, RSV and COVID-19: Children ages 87 and under will not be able to visit patients in [facility] hospitals under most circumstances.  Inpatient Visitation:    Visiting hours are 7 a.m. to 8 p.m. Up to four visitors are allowed at one time in a patient room. The visitors may rotate out with other people during the day.  One visitor age 4 or older may stay with the patient overnight and must be in the room by 8 p.m.

## 2023-06-30 NOTE — Progress Notes (Signed)
 Outpatient Surgical Follow Up   Joel Flores is an 73 y.o. male.   Chief Complaint  Patient presents with   Follow-up    HPI: Joel Flores is 5 MONTHS out from incision and drainage of complex abdominal wall and perihepatic abscess. I did I/D and also he had two drain placed by IR, the drains were removed then he reaccumulated the fluid collection.  I did obtain a recent CT scan that I have personally reviewed showing evidence of reaccumulation of abscesses.  He recently had 2 drains placed by IR as well.,  HE continues to improve clinically. No abdominal pain. He is ambulating he has not had any fevers or chills He continues to smoke Past Medical History:  Diagnosis Date   Allergy 1990   Anginal pain (HCC)    Anxiety    PANIC ATTACKS   Arthritis    right shoulder   Atherosclerosis of aorta (HCC) 02/12/2015   Atherosclerotic coronary vascular disease 2008   s/p right iliac stent   Cataract    COPD (chronic obstructive pulmonary disease) (HCC)    Cough    Depression    DM (diabetes mellitus) (HCC) 03/10/2023   Dyspnea    Environmental and seasonal allergies    Essential hypertension 02/12/2015   Family history of adverse reaction to anesthesia    Father - 56 - MI, then stroke after Prostate surgery   GERD (gastroesophageal reflux disease)    History of hiatal hernia    Hyperlipidemia    Hypertension    Hypothyroidism    Incisional hernia, without obstruction or gangrene    PVD (peripheral vascular disease) (HCC) 11/17/2016   Thyroid disease    Venous insufficiency    Vertigo    can happen daily   Wears hearing aid in both ears    has, does not wear    Past Surgical History:  Procedure Laterality Date   CATARACT EXTRACTION W/PHACO Right 03/16/2018   Procedure: CATARACT EXTRACTION PHACO AND INTRAOCULAR LENS PLACEMENT (IOC);  Surgeon: Elliot Cousin, MD;  Location: [facility];  Service: Ophthalmology;  Laterality: Right;  [place]  01:05 CDE 11.85 Fluid pack lot # [id number]    CATARACT EXTRACTION W/PHACO Left 05/08/2020   Procedure: CATARACT EXTRACTION PHACO AND INTRAOCULAR LENS PLACEMENT (IOC) LEFT 7.32 00:54.7 13.4%;  Surgeon: Lockie Mola, MD;  Location: [facility];  Service: Ophthalmology;  Laterality: Left;   CHOLECYSTECTOMY     COLONOSCOPY  2015   Dr Servando Snare- cleared for 5 years    COLONOSCOPY WITH PROPOFOL N/A 01/17/2018   Procedure: COLONOSCOPY WITH PROPOFOL;  Surgeon: Midge Minium, MD;  Location: [facility];  Service: Endoscopy;  Laterality: N/A;  requests early   HERNIA REPAIR  July 2019   INSERTION OF MESH N/A 08/31/2017   Procedure: INSERTION OF MESH;  Surgeon: Ancil Linsey, MD;  Location: [facility];  Service: General;  Laterality: N/A;   IR RADIOLOGIST EVAL & MGMT  06/01/2023   IR RADIOLOGIST EVAL & MGMT  06/08/2023   IRRIGATION AND DEBRIDEMENT ABSCESS N/A 03/10/2023   Procedure: IRRIGATION AND DEBRIDEMENT ABDOMINAL WALL ABSCESS;  Surgeon: Leafy Ro, MD;  Location: [facility];  Service: General;  Laterality: N/A;   POLYPECTOMY  01/17/2018   Procedure: POLYPECTOMY;  Surgeon: Midge Minium, MD;  Location: [facility];  Service: Endoscopy;;   UMBILICAL HERNIA REPAIR N/A 08/31/2017   Procedure: HERNIA REPAIR UMBILICAL ADULT;  Surgeon: Ancil Linsey, MD;  Location: [facility];  Service: General;  Laterality: N/A;  VEIN SURGERY     1 stents in R) leg    Family History  Problem Relation Age of Onset   Diabetes Mother    Cancer Father    Heart disease Father    Stroke Father     Social History:  reports that he has been smoking cigarettes. He has a 75 pack-year smoking history. He has been exposed to tobacco smoke. He has never used smokeless tobacco. He reports current alcohol use. He reports that he does not use drugs.  Allergies:  Allergies  Allergen Reactions   Sulfa Antibiotics Rash    Medications reviewed.    ROS Full ROS performed and is otherwise negative other than what is stated in  HPI   BP (!) 149/84   Pulse (!) 106   Temp 98.2 F (36.8 C)   Ht 5\' 8"  (1.727 m)   Wt 150 lb (68 kg)   SpO2 97%   BMI 22.81 kg/m   Physical Exam  Vitals and nursing note reviewed. Exam conducted with a chaperone present.  Constitutional:      Appearance: Normal appearance. He is not ill-appearing or diaphoretic.  Abdominal:     General: Abdomen is flat. There is no distension.     Palpations: Abdomen is soft. T there is evidence of 2 drains with seropurulent fluid.  No evidence of necrotizing infection or definitive collection on exam   musculoskeletal:        General: No swelling or tenderness. Normal range of motion.     Cervical back: Normal range of motion and neck supple. No rigidity or tenderness.  Skin:    General: Skin is warm and dry.     Capillary Refill: Capillary refill takes less than 2 seconds.  Neurological:     General: No focal deficit present.     Mental Status: He is alert and oriented to person, place, and time.  Psychiatric:        Mood and Affect: Mood normal.        Behavior: Behavior normal.        Thought Content: Thought content normal.        Judgment: Judgment normal.    Assessment/Plan: Joel Flores is a 73 year old male abscesses retained stone to drain placement by IR this is related that he is having chills.  We will wait for this by the last blasting of the stones during placement.  No need for surgical intervention at this time.  He understands that if he were to fail the debridement might be indicated.  Please note that I spent 30 minutes in this encounter including imaging studies, coordinating also encouraging to  Joel Big, MD [facility] General Surgeon

## 2023-07-08 NOTE — Progress Notes (Signed)
 Patient for IR Spyglass procedure on Friday 07/09/23, I called and spoke with the patient on the phone and gave pre-procedure instructions. Pt was made aware to be here at 9:30a, last dose of Plavix was on Sat 07/03/23, NPO after MN prior to procedure as well as driver post procedure/recovery/discharge. Pt stated understanding.  Called 07/02/23 and 07/08/23  Pre-admit testing appt was 06/30/23

## 2023-07-09 ENCOUNTER — Ambulatory Visit
Admission: RE | Admit: 2023-07-09 | Discharge: 2023-07-09 | Disposition: A | Discharge status: Home / Self Care | Source: Ambulatory Visit | Attending: Interventional Radiology | Admitting: Interventional Radiology

## 2023-07-09 ENCOUNTER — Ambulatory Visit: Admitting: Radiology

## 2023-07-09 ENCOUNTER — Encounter: Payer: Self-pay | Admitting: Certified Registered Nurse Anesthetist

## 2023-07-09 ENCOUNTER — Ambulatory Visit: Admission: RE | Admit: 2023-07-09 | Payer: Medicare PPO | Source: Ambulatory Visit | Admitting: Radiology

## 2023-07-09 ENCOUNTER — Other Ambulatory Visit: Payer: Self-pay

## 2023-07-09 ENCOUNTER — Encounter: Payer: Self-pay | Admitting: Radiology

## 2023-07-09 VITALS — BP 132/79 | HR 74 | Temp 97.9°F | Resp 31 | Ht 68.0 in | Wt 147.3 lb

## 2023-07-09 DIAGNOSIS — F419 Anxiety disorder, unspecified: Secondary | ICD-10-CM | POA: Diagnosis not present

## 2023-07-09 DIAGNOSIS — K8020 Calculus of gallbladder without cholecystitis without obstruction: Secondary | ICD-10-CM | POA: Diagnosis present

## 2023-07-09 DIAGNOSIS — Z79899 Other long term (current) drug therapy: Secondary | ICD-10-CM | POA: Insufficient documentation

## 2023-07-09 DIAGNOSIS — I2510 Atherosclerotic heart disease of native coronary artery without angina pectoris: Secondary | ICD-10-CM | POA: Insufficient documentation

## 2023-07-09 DIAGNOSIS — E039 Hypothyroidism, unspecified: Secondary | ICD-10-CM | POA: Insufficient documentation

## 2023-07-09 DIAGNOSIS — Z85820 Personal history of malignant melanoma of skin: Secondary | ICD-10-CM | POA: Diagnosis not present

## 2023-07-09 DIAGNOSIS — E1151 Type 2 diabetes mellitus with diabetic peripheral angiopathy without gangrene: Secondary | ICD-10-CM | POA: Diagnosis not present

## 2023-07-09 DIAGNOSIS — K219 Gastro-esophageal reflux disease without esophagitis: Secondary | ICD-10-CM | POA: Diagnosis not present

## 2023-07-09 DIAGNOSIS — J449 Chronic obstructive pulmonary disease, unspecified: Secondary | ICD-10-CM | POA: Insufficient documentation

## 2023-07-09 DIAGNOSIS — E119 Type 2 diabetes mellitus without complications: Secondary | ICD-10-CM

## 2023-07-09 DIAGNOSIS — Z7984 Long term (current) use of oral hypoglycemic drugs: Secondary | ICD-10-CM | POA: Insufficient documentation

## 2023-07-09 DIAGNOSIS — I10 Essential (primary) hypertension: Secondary | ICD-10-CM | POA: Diagnosis not present

## 2023-07-09 DIAGNOSIS — K8000 Calculus of gallbladder with acute cholecystitis without obstruction: Secondary | ICD-10-CM

## 2023-07-09 DIAGNOSIS — Z7902 Long term (current) use of antithrombotics/antiplatelets: Secondary | ICD-10-CM | POA: Insufficient documentation

## 2023-07-09 DIAGNOSIS — F17210 Nicotine dependence, cigarettes, uncomplicated: Secondary | ICD-10-CM | POA: Diagnosis not present

## 2023-07-09 HISTORY — PX: IR REMOVAL OF CALCULI/DEBRIS BILIARY DUCT/GB: IMG6054

## 2023-07-09 LAB — BASIC METABOLIC PANEL
Anion gap: 11 (ref 5–15)
BUN: 14 mg/dL (ref 8–23)
CO2: 24 mmol/L (ref 22–32)
Calcium: 9.6 mg/dL (ref 8.9–10.3)
Chloride: 105 mmol/L (ref 98–111)
Creatinine, Ser: 0.54 mg/dL — ABNORMAL LOW (ref 0.61–1.24)
GFR, Estimated: >60 mL/min (ref >60)
Glucose, Bld: 119 mg/dL — ABNORMAL HIGH (ref 70–99)
Potassium: 3.8 mmol/L (ref 3.5–5.1)
Sodium: 140 mmol/L (ref 135–145)

## 2023-07-09 LAB — CBC
HCT: 43.8 % (ref 39.0–52.0)
Hemoglobin: 14.6 g/dL (ref 13.0–17.0)
MCH: 28.5 pg (ref 26.0–34.0)
MCHC: 33.3 g/dL (ref 30.0–36.0)
MCV: 85.5 fL (ref 80.0–100.0)
Platelets: 363 10*3/uL (ref 150–400)
RBC: 5.12 MIL/uL (ref 4.22–5.81)
RDW: 14.9 % (ref 11.5–15.5)
WBC: 9.6 10*3/uL (ref 4.0–10.5)
nRBC: 0 % (ref 0.0–0.2)

## 2023-07-09 LAB — GLUCOSE, CAPILLARY: Glucose-Capillary: 123 mg/dL — ABNORMAL HIGH (ref 70–99)

## 2023-07-09 LAB — PROTIME-INR
INR: 1 (ref 0.8–1.2)
Prothrombin Time: 13.1 s (ref 11.4–15.2)

## 2023-07-09 MED ORDER — LIDOCAINE HCL (PF) 2 % IJ SOLN
INTRAMUSCULAR | Status: DC | PRN
  Administered 2023-07-09: 80 mg via INTRADERMAL

## 2023-07-09 MED ORDER — OXYCODONE-ACETAMINOPHEN 5-325 MG PO TABS
ORAL_TABLET | ORAL | Status: AC
  Filled 2023-07-09: qty 1

## 2023-07-09 MED ORDER — PHENYLEPHRINE 80 MCG/ML (10ML) SYRINGE FOR IV PUSH (FOR BLOOD PRESSURE SUPPORT)
PREFILLED_SYRINGE | INTRAVENOUS | Status: DC | PRN
  Administered 2023-07-09 (×3): 80 ug via INTRAVENOUS
  Administered 2023-07-09: 160 ug via INTRAVENOUS
  Administered 2023-07-09 (×3): 80 ug via INTRAVENOUS

## 2023-07-09 MED ORDER — OXYCODONE HCL 5 MG PO TABS: 5.0000 mg | ORAL_TABLET | Freq: Once | ORAL | Status: DC | PRN

## 2023-07-09 MED ORDER — ONDANSETRON HCL 4 MG/2ML IJ SOLN
INTRAMUSCULAR | Status: DC | PRN
  Administered 2023-07-09: 4 mg via INTRAVENOUS

## 2023-07-09 MED ORDER — ORAL CARE MOUTH RINSE: 15.0000 mL | Freq: Once | OROMUCOSAL | Status: AC

## 2023-07-09 MED ORDER — DEXMEDETOMIDINE HCL IN NACL 80 MCG/20ML IV SOLN
INTRAVENOUS | Status: DC | PRN
Start: 2023-07-09 — End: 2023-07-09
  Administered 2023-07-09: 8 ug via INTRAVENOUS

## 2023-07-09 MED ORDER — GLYCOPYRROLATE 0.2 MG/ML IJ SOLN
INTRAMUSCULAR | Status: DC | PRN
  Administered 2023-07-09: .2 mg via INTRAVENOUS

## 2023-07-09 MED ORDER — LIDOCAINE HCL 1 % IJ SOLN
10.0000 mL | Freq: Once | INTRAMUSCULAR | Status: AC
  Administered 2023-07-09: 8 mL via INTRADERMAL

## 2023-07-09 MED ORDER — FENTANYL CITRATE (PF) 100 MCG/2ML IJ SOLN: 25.0000 ug | INTRAMUSCULAR | Status: DC | PRN

## 2023-07-09 MED ORDER — FENTANYL CITRATE (PF) 100 MCG/2ML IJ SOLN
INTRAMUSCULAR | Status: DC | PRN
  Administered 2023-07-09 (×2): 50 ug via INTRAVENOUS

## 2023-07-09 MED ORDER — ONDANSETRON HCL 4 MG/2ML IJ SOLN: 4.0000 mg | Freq: Once | INTRAMUSCULAR | Status: DC | PRN

## 2023-07-09 MED ORDER — LIDOCAINE HCL 1 % IJ SOLN
INTRAMUSCULAR | Status: AC
  Filled 2023-07-09: qty 20

## 2023-07-09 MED ORDER — SODIUM CHLORIDE 0.9 % IV SOLN: INTRAVENOUS | Status: DC

## 2023-07-09 MED ORDER — HYDROCODONE-ACETAMINOPHEN 5-325 MG PO TABS: 1.0000 | ORAL_TABLET | Freq: Four times a day (QID) | ORAL | 0 refills | Status: AC | PRN

## 2023-07-09 MED ORDER — OXYCODONE-ACETAMINOPHEN 5-325 MG PO TABS
2.0000 | ORAL_TABLET | ORAL | Status: DC | PRN
  Administered 2023-07-09 (×2): 1 via ORAL

## 2023-07-09 MED ORDER — CHLORHEXIDINE GLUCONATE 0.12 % MT SOLN
15.0000 mL | Freq: Once | OROMUCOSAL | Status: AC
  Administered 2023-07-09: 15 mL via OROMUCOSAL
  Filled 2023-07-09: qty 15

## 2023-07-09 MED ORDER — EPHEDRINE SULFATE-NACL 50-0.9 MG/10ML-% IV SOSY
PREFILLED_SYRINGE | INTRAVENOUS | Status: DC | PRN
  Administered 2023-07-09: 5 mg via INTRAVENOUS
  Administered 2023-07-09: 10 mg via INTRAVENOUS
  Administered 2023-07-09: 5 mg via INTRAVENOUS

## 2023-07-09 MED ORDER — SODIUM CHLORIDE 0.9% FLUSH: 3.0000 mL | Freq: Two times a day (BID) | INTRAVENOUS | Status: DC

## 2023-07-09 MED ORDER — OXYCODONE HCL 5 MG/5ML PO SOLN: 5.0000 mg | Freq: Once | ORAL | Status: DC | PRN

## 2023-07-09 MED ORDER — IOHEXOL 300 MG/ML  SOLN
50.0000 mL | Freq: Once | INTRAMUSCULAR | Status: AC | PRN
  Administered 2023-07-09: 5 mL

## 2023-07-09 MED ORDER — SODIUM CHLORIDE 0.9% FLUSH: 5.0000 mL | Freq: Three times a day (TID) | INTRAVENOUS | Status: DC

## 2023-07-09 MED ORDER — PROPOFOL 500 MG/50ML IV EMUL
INTRAVENOUS | Status: DC | PRN
  Administered 2023-07-09: 120 ug/kg/min via INTRAVENOUS

## 2023-07-09 MED ORDER — PROPOFOL 10 MG/ML IV BOLUS
INTRAVENOUS | Status: DC | PRN
  Administered 2023-07-09: 20 mg via INTRAVENOUS

## 2023-07-09 MED ORDER — SODIUM CHLORIDE 0.9% FLUSH: 3.0000 mL | INTRAVENOUS | Status: DC | PRN

## 2023-07-09 NOTE — Anesthesia Postprocedure Evaluation (Signed)
 Anesthesia Post Note  Patient: Joel Flores  Procedure(s) Performed: IR REMOVAL OF CALCULI/DEBRIS BILIARY DUCT/GB  Patient location during evaluation: Phase II Anesthesia Type: General Level of consciousness: awake Pain management: satisfactory to patient Vital Signs Assessment: post-procedure vital signs reviewed and stable Respiratory status: spontaneous breathing and nonlabored ventilation Cardiovascular status: stable Anesthetic complications: no   No notable events documented.   Last Vitals:  Vitals:   07/09/23 0953 07/09/23 1206  BP: (!) 149/95 126/79  Pulse: 99 70  Resp: 15 (!) 24  Temp: 36.6 C   SpO2: 97% 95%    Last Pain:  Vitals:   07/09/23 0953  TempSrc: Oral  PainSc: 0-No pain                 VAN STAVEREN,Yasseen Salls

## 2023-07-09 NOTE — H&P (Signed)
 Chief Complaint: Patient was seen in consultation today for gallstones  Referring Physician(s): Dr. Everlene Farrier  Supervising Physician: Malachy Moan  Patient Status: [facility] - Out-pt  History of Present Illness: Joel Flores is a 73 y.o. male with a medical history significant for tobacco use (1-1/2 pack/day for the past 50 years), hypertension, GERD, hypothyroidism, anxiety, bilateral carotid artery stenosis, colonic polyps, diabetes mellitus type 2, melanoma, peripheral vascular disease (Plavix), and history of lap cholecystectomy for perforated gallbladder in 2018. He initially presented to the ED 03/10/23 with complaints of left flank pain and swelling that had started in August. CT scan showed a perihepatic multi septated abscess that was likely due to a walled-off dropped gallstone from his cholecystectomy.   He was taken to the OR for I&D but was evaluated by Surgery again 04/19/23 for drainage from the surgical wound. CT imaging showed a large, complex, persistent abdominal wall and perihepatic abscess which had increased in size from prior imaging. Interventional Radiology was consulted for drain placement with potential removal of gallstones after abscess resolution. On 05/17/23 Dr. Archer Asa placed two separate 12 Fr drains were placed into the right perihepatic and right mid-abdominal wall abscesses.   He followed up with Dr. Archer Asa 06/01/23 and the abscesses had essentially resolved. Furthermore, the previously identified high attenuation gallstones were no longer visible on CT imaging. The patient and his wife had been noting small dark flecks coming out of the drainage from the tubes and these likely represented fragmentation of the underlying gallstones. The drains were left in place and the patient was instructed to pursue a more aggressive flushing strategy.    The patient followed up again with Dr. Archer Asa 06/08/23 and was doing extremely well. The patient reported no  further output of dark stone fragments and the drain output was clear. CT imaging still showed resolution of the abscesses and no evidence of gallstones. The drains were removed and and no further follow up was scheduled with IR.   Mr. Bogard followed up with his Dr. Everlene Farrier 06/14/23 and was still doing well but had some fullness in his right upper abdomen. CT  imaging 06/15/23 showed "Increased size/reaccumulation of right Peri hepatic thick-walled fluid collection/abscess with small calcifications, measuring up to 9.1 cm. Increased size of thick walled rim enhancing fluid collection/abscess slightly more caudal and within the superficial abdominal wall, measuring up to 5.1 cm."  He was referred back to Interventional Radiology for placement of drainage catheters and this was performed 06/21/23. He presents today for attempted percutaneous gallstone retrieval.   Past Medical History:  Diagnosis Date   Allergy 1990   Anginal pain (HCC)    Anxiety    PANIC ATTACKS   Arthritis    right shoulder   Atherosclerosis of aorta (HCC) 02/12/2015   Atherosclerotic coronary vascular disease 2008   s/p right iliac stent   Cataract    COPD (chronic obstructive pulmonary disease) (HCC)    Cough    Depression    DM (diabetes mellitus) (HCC) 03/10/2023   Dyspnea    Environmental and seasonal allergies    Essential hypertension 02/12/2015   Family history of adverse reaction to anesthesia    Father - 72 - MI, then stroke after Prostate surgery   GERD (gastroesophageal reflux disease)    History of hiatal hernia    Hyperlipidemia    Hypertension    Hypothyroidism    Incisional hernia, without obstruction or gangrene    PVD (peripheral vascular disease) (HCC) 11/17/2016  Venous insufficiency    Vertigo    can happen daily   Wears hearing aid in both ears    has, does not wear    Past Surgical History:  Procedure Laterality Date   CATARACT EXTRACTION W/PHACO Right 03/16/2018   Procedure: CATARACT  EXTRACTION PHACO AND INTRAOCULAR LENS PLACEMENT (IOC);  Surgeon: Elliot Cousin, MD;  Location: [facility] ORS;  Service: Ophthalmology;  Laterality: Right;  [place]  01:05 CDE 11.85 Fluid pack lot # [id number]   CATARACT EXTRACTION W/PHACO Left 05/08/2020   Procedure: CATARACT EXTRACTION PHACO AND INTRAOCULAR LENS PLACEMENT (IOC) LEFT 7.32 00:54.7 13.4%;  Surgeon: Lockie Mola, MD;  Location: [facility];  Service: Ophthalmology;  Laterality: Left;   CHOLECYSTECTOMY     COLONOSCOPY  2015   Dr Servando Snare- cleared for 5 years    COLONOSCOPY WITH PROPOFOL N/A 01/17/2018   Procedure: COLONOSCOPY WITH PROPOFOL;  Surgeon: Midge Minium, MD;  Location: [facility];  Service: Endoscopy;  Laterality: N/A;  requests early   HERNIA REPAIR  July 2019   INSERTION OF MESH N/A 08/31/2017   Procedure: INSERTION OF MESH;  Surgeon: Ancil Linsey, MD;  Location: [facility] ORS;  Service: General;  Laterality: N/A;   IR RADIOLOGIST EVAL & MGMT  06/01/2023   IR RADIOLOGIST EVAL & MGMT  06/08/2023   IRRIGATION AND DEBRIDEMENT ABSCESS N/A 03/10/2023   Procedure: IRRIGATION AND DEBRIDEMENT ABDOMINAL WALL ABSCESS;  Surgeon: Leafy Ro, MD;  Location: [facility] ORS;  Service: General;  Laterality: N/A;   POLYPECTOMY  01/17/2018   Procedure: POLYPECTOMY;  Surgeon: Midge Minium, MD;  Location: [facility];  Service: Endoscopy;;   UMBILICAL HERNIA REPAIR N/A 08/31/2017   Procedure: HERNIA REPAIR UMBILICAL ADULT;  Surgeon: Ancil Linsey, MD;  Location: [facility] ORS;  Service: General;  Laterality: N/A;   VEIN SURGERY     1 stents in R) leg    Allergies: Sulfa antibiotics  Medications: Prior to Admission medications   Medication Sig Start Date End Date Taking? Authorizing Provider  acetaminophen (TYLENOL) 325 MG tablet Take 325 mg by mouth daily as needed for moderate pain or headache.    [provider]  albuterol (VENTOLIN HFA) 108 (90 Base) MCG/ACT inhaler Inhale into the lungs. Inhale 2  inhalations into the lungs 4 (four) times daily PRN 05/26/21   [provider]  amLODipine (NORVASC) 5 MG tablet Take 10 mg by mouth daily.    [provider]  azelastine (ASTELIN) 0.1 % nasal spray Place 2 sprays into both nostrils 2 (two) times daily. Use in each nostril as directed 11/03/21   Duanne Limerick, MD  cetirizine (ZYRTEC) 10 MG tablet TAKE 1 TABLET BY MOUTH EVERY DAY 06/03/23   Duanne Limerick, MD  clopidogrel (PLAVIX) 75 MG tablet Take 1 tablet (75 mg total) by mouth daily. 04/05/23   Duanne Limerick, MD  famotidine (PEPCID) 20 MG tablet Take 20 mg by mouth as needed for heartburn or indigestion.    [provider]  fluticasone (FLONASE) 50 MCG/ACT nasal spray SPRAY 2 SPRAYS INTO EACH NOSTRIL EVERY DAY 04/05/23   Duanne Limerick, MD  ibuprofen (ADVIL,MOTRIN) 200 MG tablet Take 400 mg by mouth every 8 (eight) hours as needed (for pain).    [provider]  levothyroxine (SYNTHROID) 112 MCG tablet Take 1 tablet (112 mcg total) by mouth daily. 04/05/23   Duanne Limerick, MD  losartan (COZAAR) 100 MG tablet Take 100 mg by mouth daily.    [provider]  metFORMIN (GLUCOPHAGE) 500 MG tablet TAKE 1 TABLET BY MOUTH EVERY DAY WITH BREAKFAST 04/05/23   Duanne Limerick, MD  mirtazapine (REMERON) 15 MG tablet Take 1 tablet by mouth at bedtime. 06/28/23 06/27/24  [provider]  rosuvastatin (CRESTOR) 40 MG tablet Take 1 tablet (40 mg total) by mouth daily. 04/05/23   Duanne Limerick, MD  sertraline (ZOLOFT) 100 MG tablet Take 1 tablet (100 mg total) by mouth daily. 04/05/23   Duanne Limerick, MD     Family History  Problem Relation Age of Onset   Diabetes Mother    Cancer Father    Heart disease Father    Stroke Father     Social History   Socioeconomic History   Marital status: Married    Spouse name: Oral, Remache (Spouse)   Number of children: 3   Years of education: Not on file   Highest education level: 12th grade  Occupational History    Occupation: retired  Tobacco Use   Smoking status: Every Day    Current packs/day: 1.50    Average packs/day: 1.5 packs/day for 50.0 years (75.0 ttl pk-yrs)    Types: Cigarettes    Passive exposure: Past   Smokeless tobacco: Never  Vaping Use   Vaping status: Never Used  Substance and Sexual Activity   Alcohol use: Yes    Comment: once a month   Drug use: No   Sexual activity: Not Currently  Other Topics Concern   Not on file  Social History Narrative   Lives with wife, Harriett Sine.   Social Drivers of Corporate investment banker Strain: Low Risk  (06/28/2023)   Received from [facility]   Overall Financial Resource Strain (CARDIA)    Difficulty of Paying Living Expenses: Not very hard  Food Insecurity: No Food Insecurity (06/28/2023)   Received from [facility]   Hunger Vital Sign    Worried About Running Out of Food in the Last Year: Never true    Ran Out of Food in the Last Year: Never true  Transportation Needs: No Transportation Needs (06/28/2023)   Received from [facility] - Transportation    In the past 12 months, has lack of transportation kept you from medical appointments or from getting medications?: No    Lack of Transportation (Non-Medical): No  Physical Activity: Unknown (04/05/2023)   Exercise Vital Sign    Days of Exercise per Week: 1 day    Minutes of Exercise per Session: Patient declined  Stress: Patient Declined (04/05/2023)   [employer] of Occupational Health - Occupational Stress Questionnaire    Feeling of Stress : Patient declined  Social Connections: Unknown (04/05/2023)   Social Connection and Isolation Panel [NHANES]    Frequency of Communication with Friends and Family: Patient declined    Frequency of Social Gatherings with Friends and Family: Patient declined    Attends Religious Services: Patient declined    Database administrator or Organizations: Patient declined    Attends  Engineer, structural: Not on file    Marital Status: Patient declined    Review of Systems: A 12 point ROS discussed and pertinent positives are indicated in the HPI above.  All other systems are negative.  Review of Systems  All other systems reviewed and are negative.   Vital Signs: BP (!) 149/95   Pulse 99   Temp 97.9 F (36.6 C) (Oral)  Resp 15   Ht 5\' 8"  (1.727 m)   Wt 147 lb 4.8 oz (66.8 kg)   SpO2 97%   BMI 22.40 kg/m   Physical Exam Constitutional:      General: He is not in acute distress.    Appearance: He is not ill-appearing.  HENT:     Mouth/Throat:     Mouth: Mucous membranes are moist.     Pharynx: Oropharynx is clear.  Cardiovascular:     Rate and Rhythm: Normal rate.     Pulses: Normal pulses.  Pulmonary:     Effort: Pulmonary effort is normal.  Abdominal:     Tenderness: There is no abdominal tenderness.     Comments: Right upper quadrants drains x 2. Both to bulb suction.   Skin:    General: Skin is warm and dry.  Neurological:     Mental Status: He is alert and oriented to person, place, and time.     Imaging: CT GUIDED PERITONEAL/RETROPERITONEAL FLUID DRAIN BY PERC CATH Result Date: 06/21/2023 INDICATION: Recurrent intra-abdominal abscesses due to retained gallstones. He presents for repeat drain placement. EXAM: Placement of 2 individual drainage catheters TECHNIQUE: Multidetector CT imaging of the abdomen was performed following the standard protocol without IV contrast. RADIATION DOSE REDUCTION: This exam was performed according to the departmental dose-optimization program which includes automated exposure control, adjustment of the mA and/or kV according to patient size and/or use of iterative reconstruction technique. MEDICATIONS: None. ANESTHESIA/SEDATION: Moderate (conscious) sedation was employed during this procedure. A total of Versed 2 mg and Fentanyl 100 mcg was administered intravenously by the radiology nurse. Total  intra-service moderate Sedation Time: 28 minutes. The patient's level of consciousness and vital signs were monitored continuously by radiology nursing throughout the procedure under my direct supervision. COMPLICATIONS: None immediate. PROCEDURE: Informed written consent was obtained from the patient after a thorough discussion of the procedural risks, benefits and alternatives. All questions were addressed. Maximal Sterile Barrier Technique was utilized including caps, mask, sterile gowns, sterile gloves, sterile drape, hand hygiene and skin antiseptic. A timeout was performed prior to the initiation of the procedure. Axial CT imaging was obtained. Both fluid collections were identified. There is been a significant change compared to the relatively recent prior CT scan of the abdomen and pelvis dated 06/15/2023. The superficial collection spontaneously ruptured and decompressed resulting in decompression of all 3 abscess cavities. There is still some residual fluid and the retained stones present. Both the right sub peritoneal space and the right perihepatic space collections were targeted. Local anesthesia was attained by infiltration with 1% lidocaine. Small dermatotomy is were made. Using intermittent CT guidance, 18 gauge trocar needles were carefully advanced into both fluid collections. 0.035 wires were advanced into both fluid collections. The percutaneous tract were dilated to 12 [place]. Twelve [place] cook drains were then advanced over the wires and formed in each collection. Post placement CT imaging demonstrates successful placement of the 2 drainage catheters. The catheters were gently flushed and secured to the skin with 0 silk sutures. Both drains were connected to JP bulb suction. IMPRESSION: 1. Placement of a 12 French drainage catheter into the perihepatic abscess. 2. Placement of a 12 French drainage catheter into the right lateral abdominal intra peritoneal collection. Electronically Signed    By: Malachy Moan M.D.   On: 06/21/2023 12:59   CT GUIDED PERITONEAL/RETROPERITONEAL FLUID DRAIN BY PERC CATH Result Date: 06/21/2023 INDICATION: Recurrent intra-abdominal abscesses due to retained gallstones. He presents for repeat drain placement.  EXAM: Placement of 2 individual drainage catheters TECHNIQUE: Multidetector CT imaging of the abdomen was performed following the standard protocol without IV contrast. RADIATION DOSE REDUCTION: This exam was performed according to the departmental dose-optimization program which includes automated exposure control, adjustment of the mA and/or kV according to patient size and/or use of iterative reconstruction technique. MEDICATIONS: None. ANESTHESIA/SEDATION: Moderate (conscious) sedation was employed during this procedure. A total of Versed 2 mg and Fentanyl 100 mcg was administered intravenously by the radiology nurse. Total intra-service moderate Sedation Time: 28 minutes. The patient's level of consciousness and vital signs were monitored continuously by radiology nursing throughout the procedure under my direct supervision. COMPLICATIONS: None immediate. PROCEDURE: Informed written consent was obtained from the patient after a thorough discussion of the procedural risks, benefits and alternatives. All questions were addressed. Maximal Sterile Barrier Technique was utilized including caps, mask, sterile gowns, sterile gloves, sterile drape, hand hygiene and skin antiseptic. A timeout was performed prior to the initiation of the procedure. Axial CT imaging was obtained. Both fluid collections were identified. There is been a significant change compared to the relatively recent prior CT scan of the abdomen and pelvis dated 06/15/2023. The superficial collection spontaneously ruptured and decompressed resulting in decompression of all 3 abscess cavities. There is still some residual fluid and the retained stones present. Both the right sub peritoneal space and  the right perihepatic space collections were targeted. Local anesthesia was attained by infiltration with 1% lidocaine. Small dermatotomy is were made. Using intermittent CT guidance, 18 gauge trocar needles were carefully advanced into both fluid collections. 0.035 wires were advanced into both fluid collections. The percutaneous tract were dilated to 12 [place]. Twelve [place] cook drains were then advanced over the wires and formed in each collection. Post placement CT imaging demonstrates successful placement of the 2 drainage catheters. The catheters were gently flushed and secured to the skin with 0 silk sutures. Both drains were connected to JP bulb suction. IMPRESSION: 1. Placement of a 12 French drainage catheter into the perihepatic abscess. 2. Placement of a 12 French drainage catheter into the right lateral abdominal intra peritoneal collection. Electronically Signed   By: Malachy Moan M.D.   On: 06/21/2023 12:59   CT ABDOMEN PELVIS W CONTRAST Result Date: 06/15/2023 CLINICAL DATA:  Right incisional swelling 2 drains in right upper quadrant area that were recently removed, right upper quadrant tenderness EXAM: CT ABDOMEN AND PELVIS WITH CONTRAST TECHNIQUE: Multidetector CT imaging of the abdomen and pelvis was performed using the standard protocol following bolus administration of intravenous contrast. RADIATION DOSE REDUCTION: This exam was performed according to the departmental dose-optimization program which includes automated exposure control, adjustment of the mA and/or kV according to patient size and/or use of iterative reconstruction technique. CONTRAST:  OMNIPAQUE IOHEXOL 300 MG/ML  SOLN COMPARISON:  CT 06/08/2023, 06/01/2023 FINDINGS: Lower chest: Lung bases demonstrate no acute airspace disease. Stable 4 mm right lung base Peri fissural nodule, no imaging follow-up recommended. Hepatobiliary: Cholecystectomy. No biliary dilatation. No focal hepatic abnormality Pancreas:  Unremarkable. No pancreatic ductal dilatation or surrounding inflammatory changes. Spleen: Normal in size without focal abnormality. Adrenals/Urinary Tract: Adrenal glands are unremarkable. Kidneys are normal, without renal calculi, focal lesion, or hydronephrosis. Bladder is unremarkable. Stomach/Bowel: Stomach is within normal limits. Appendix appears normal. No evidence of bowel wall thickening, distention, or inflammatory changes. Diverticular disease of sigmoid colon without definite inflammatory process. Vascular/Lymphatic: Aortic atherosclerosis. No enlarged abdominal or pelvic lymph nodes. Reproductive: Prostate is unremarkable. Other: Negative  for pelvic effusion or free air. Interim removal of right sided percutaneous drainage catheters. Increased right Peri hepatic thick-walled fluid collection with small calcifications, this measures approximately 9.1 by 1.4 cm. Increased size of thick walled rim enhancing fluid collection slightly more caudal and within the superficial abdominal wall, this measures 5.1 by 2 cm. Soft tissue thickening tracks to the right lower quadrant. Musculoskeletal: No acute or suspicious osseous abnormality. IMPRESSION: 1. Interim removal of right-sided percutaneous drainage catheters. Increased size/reaccumulation of right Peri hepatic thick-walled fluid collection/abscess with small calcifications, measuring up to 9.1 cm. Increased size of thick walled rim enhancing fluid collection/abscess slightly more caudal and within the superficial abdominal wall, measuring up to 5.1 cm. 2. Diverticular disease of sigmoid colon without definite inflammatory process. 3. Aortic atherosclerosis. Aortic Atherosclerosis (ICD10-I70.0). Electronically Signed   By: Jasmine Pang M.D.   On: 06/15/2023 17:24    Labs:  CBC: Recent Labs    03/11/23 0444 05/17/23 1241 06/21/23 1033 07/09/23 0944  WBC 8.5 9.1 7.6 9.6  HGB 11.5* 14.4 13.9 14.6  HCT 34.4* 44.6 42.3 43.8  PLT 397 413* 415* 363     COAGS: Recent Labs    03/10/23 0902 05/17/23 1241 06/21/23 1033 07/09/23 0944  INR 1.1 1.1 1.1 1.0    BMP:   LIVER FUNCTION TESTS: Recent Labs    11/27/22 1103 03/09/23 1645 03/10/23 0337  BILITOT  --  0.3 0.4  AST  --  18 15  ALT  --  10 9  ALKPHOS  --  86 78  PROT  --  7.6 6.9  ALBUMIN 3.8 3.6 3.4*    TUMOR MARKERS: No results for input(s): "AFPTM", "CEA", "CA199", "CHROMGRNA" in the last 8760 hours.  Assessment and Plan:  Gallstones with recurrent abscesses: Lissa Merlin. Haseman, 73 year old male, presents today to the [facility] Interventional Radiology department for an image-guided percutaneous gallstone retrieval. The procedure will be performed under MAC sedation.   Risks and benefits were discussed with the patient including, but not limited to, bleeding, infection bile leak, sepsis or even death.  All of the patient's questions were answered, patient is agreeable to proceed. He has been NPO. He is a full code.   Consent signed and in chart.  Thank you for this interesting consult.  I greatly enjoyed meeting [employer] and look forward to participating in their care.  A copy of this report was sent to the requesting provider on this date.  Electronically Signed: Alwyn Ren, AGACNP-BC 07/09/2023, 10:35 AM   I spent a total of  30 Minutes   in face to face in clinical consultation, greater than 50% of which was counseling/coordinating care for gallstones

## 2023-07-09 NOTE — Anesthesia Preprocedure Evaluation (Signed)
 Anesthesia Evaluation  Patient identified by MRN, date of birth, ID band Patient awake    Reviewed: Allergy & Precautions, NPO status , Patient's Chart, lab work & pertinent test results  Airway Mallampati: II  TM Distance: >3 FB Neck ROM: Full    Dental  (+) Teeth Intact, Missing   Pulmonary neg pulmonary ROS, asthma , COPD,  COPD inhaler, Current Smoker and Patient abstained from smoking.   Pulmonary exam normal  + decreased breath sounds      Cardiovascular Exercise Tolerance: Good hypertension, Pt. on medications + CAD and + Peripheral Vascular Disease  negative cardio ROS Normal cardiovascular exam Rhythm:Regular Rate:Normal     Neuro/Psych   Anxiety     negative neurological ROS  negative psych ROS   GI/Hepatic negative GI ROS, Neg liver ROS, hiatal hernia,GERD  Medicated,,  Endo/Other  negative endocrine ROSdiabetesHypothyroidism    Renal/GU negative Renal ROS  negative genitourinary   Musculoskeletal  (+) Arthritis ,    Abdominal Normal abdominal exam  (+)   Peds negative pediatric ROS (+)  Hematology negative hematology ROS (+)   Anesthesia Other Findings Past Medical History: 1990: Allergy No date: Anginal pain (HCC) No date: Anxiety     Comment:  PANIC ATTACKS No date: Arthritis     Comment:  right shoulder 02/12/2015: Atherosclerosis of aorta (HCC) 2008: Atherosclerotic coronary vascular disease     Comment:  s/p right iliac stent No date: Cataract No date: COPD (chronic obstructive pulmonary disease) (HCC) No date: Cough No date: Depression 03/10/2023: DM (diabetes mellitus) (HCC) No date: Dyspnea No date: Environmental and seasonal allergies 02/12/2015: Essential hypertension No date: Family history of adverse reaction to anesthesia     Comment:  Father - 77 - MI, then stroke after Prostate surgery No date: GERD (gastroesophageal reflux disease) No date: History of hiatal hernia No  date: Hyperlipidemia No date: Hypertension No date: Hypothyroidism No date: Incisional hernia, without obstruction or gangrene 11/17/2016: PVD (peripheral vascular disease) (HCC) No date: Venous insufficiency No date: Vertigo     Comment:  can happen daily No date: Wears hearing aid in both ears     Comment:  has, does not wear  Past Surgical History: 03/16/2018: CATARACT EXTRACTION W/PHACO; Right     Comment:  Procedure: CATARACT EXTRACTION PHACO AND INTRAOCULAR               LENS PLACEMENT (IOC);  Surgeon: Elliot Cousin, MD;                Location: [facility];  Service: Ophthalmology;  Laterality:              Right;  [place]  01:05 CDE 11.85 Fluid pack lot # [id number] 05/08/2020: CATARACT EXTRACTION W/PHACO; Left     Comment:  Procedure: CATARACT EXTRACTION PHACO AND INTRAOCULAR               LENS PLACEMENT (IOC) LEFT 7.32 00:54.7 13.4%;  Surgeon:               Lockie Mola, MD;  Location: [facility];              Service: Ophthalmology;  Laterality: Left; No date: CHOLECYSTECTOMY 2015: COLONOSCOPY     Comment:  Dr Servando Snare- cleared for 5 years  01/17/2018: COLONOSCOPY WITH PROPOFOL; N/A     Comment:  Procedure: COLONOSCOPY WITH PROPOFOL;  Surgeon: Midge Minium, MD;  Location: [facility];  Service:               Endoscopy;  Laterality: N/A;  requests early July 2019: HERNIA REPAIR 08/31/2017: INSERTION OF MESH; N/A     Comment:  Procedure: INSERTION OF MESH;  Surgeon: Ancil Linsey, MD;  Location: [facility];  Service: General;                Laterality: N/A; 06/01/2023: IR RADIOLOGIST EVAL & MGMT 06/08/2023: IR RADIOLOGIST EVAL & MGMT 03/10/2023: IRRIGATION AND DEBRIDEMENT ABSCESS; N/A     Comment:  Procedure: IRRIGATION AND DEBRIDEMENT ABDOMINAL WALL               ABSCESS;  Surgeon: Leafy Ro, MD;  Location: [facility]               ORS;  Service: General;  Laterality: N/A; 01/17/2018: POLYPECTOMY     Comment:  Procedure:  POLYPECTOMY;  Surgeon: Midge Minium, MD;                Location: [facility];  Service: Endoscopy;; 08/31/2017: UMBILICAL HERNIA REPAIR; N/A     Comment:  Procedure: HERNIA REPAIR UMBILICAL ADULT;  Surgeon:               Ancil Linsey, MD;  Location: [facility];  Service:               General;  Laterality: N/A; No date: VEIN SURGERY     Comment:  1 stents in R) leg     Reproductive/Obstetrics negative OB ROS                             Anesthesia Physical Anesthesia Plan  ASA: 3  Anesthesia Plan: General   Post-op Pain Management:    Induction: Intravenous  PONV Risk Score and Plan: Ondansetron, Dexamethasone, Midazolam and Treatment may vary due to age or medical condition  Airway Management Planned: Oral ETT  Additional Equipment:   Intra-op Plan:   Post-operative Plan: Extubation in OR  Informed Consent: I have reviewed the patients History and Physical, chart, labs and discussed the procedure including the risks, benefits and alternatives for the proposed anesthesia with the patient or authorized representative who has indicated his/her understanding and acceptance.     Dental Advisory Given  Plan Discussed with: CRNA  Anesthesia Plan Comments:        Anesthesia Quick Evaluation

## 2023-07-09 NOTE — Transfer of Care (Signed)
 Immediate Anesthesia Transfer of Care Note  Patient: Joel Flores  Procedure(s) Performed: IR REMOVAL OF CALCULI/DEBRIS BILIARY DUCT/GB  Patient Location:  [facility]  Anesthesia Type:General  Level of Consciousness: awake, alert , and oriented  Airway & Oxygen Therapy: Patient Spontanous Breathing  Post-op Assessment: Report given to RN and Post -op Vital signs reviewed and stable  Post vital signs: Reviewed and stable  Last Vitals:  Vitals Value Taken Time  BP 126/79 07/09/23 1206  Temp    Pulse 68 07/09/23 1209  Resp 28 07/09/23 1209  SpO2 94 % 07/09/23 1209  Vitals shown include unfiled device data.  Last Pain:  Vitals:   07/09/23 0953  TempSrc: Oral  PainSc: 0-No pain         Complications: No notable events documented.

## 2023-07-09 NOTE — Anesthesia Procedure Notes (Signed)
 Procedure Name: MAC Date/Time: 07/09/2023 11:33 AM  Performed by: Hezzie Bump, CRNAPre-anesthesia Checklist: Patient identified, Emergency Drugs available, Suction available and Patient being monitored Patient Re-evaluated:Patient Re-evaluated prior to induction Oxygen Delivery Method: Simple face mask Induction Type: IV induction Placement Confirmation: positive ETCO2

## 2023-07-13 ENCOUNTER — Ambulatory Visit
Admission: RE | Admit: 2023-07-13 | Discharge: 2023-07-13 | Disposition: A | Discharge status: Home / Self Care | Source: Ambulatory Visit | Attending: Student | Admitting: Student

## 2023-07-13 DIAGNOSIS — K8020 Calculus of gallbladder without cholecystitis without obstruction: Secondary | ICD-10-CM

## 2023-07-13 HISTORY — PX: IR RADIOLOGIST EVAL & MGMT: IMG5224

## 2023-07-13 NOTE — Progress Notes (Signed)
 Chief Complaint: Patient was seen in consultation today for retained gallstones at the request of Covington,Jamie R  Referring Physician(s): Covington,Jamie R  History of Present Illness: Joel Flores is a 73 y.o. male Who is initially seen and treated at [facility] due to perforated cholecystitis.  He underwent laparoscopic cholecystectomy on 11/04/2016 performed by Dr. 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  He did well until this past Christmas when he presented to the emergency department with an abscess along the anterior abdominal wall.  CT imaging demonstrates that the abscess communicates with 2 intraperitoneal collections containing small radiopaque foci.  Patient underwent percutaneous drain placement on 05/17/2023 and did well.  The stones were not clearly evident on follow-up imaging and drain injections and were thought to of possibly dissolved and come out in fragments from the drainage catheters.  The drainage catheters were removed, but unfortunately his abscess recurred within 1 week and he had to undergo repeat percutaneous drain placement on 06/21/2023.  Subsequently, we attempted an endoscopic removal of the stones utilizing the spyglass Endo biliary scope and snare baskets on 07/09/2023.  Several of the stones could be visualized, however they are nearly fully embedded within the wall of the thickened and scarred peritoneum.  Extraction was unsuccessful.  The pockets were copiously lavaged with saline and also with a Betadine solution which was allowed to dwell in place for 10 minutes before being lavaged out.  Patient currently has two 14 French drainage catheters in place and presents to the IR clinic today with his wife for follow-up evaluation.   He has mild discomfort but is overall doing well.  Drain output is scant and commensurate with the volume of flushes that he is putting through the catheters.  The drain output was initially clear but is now  slightly cloudy.  It is not frankly purulent or thick.  He denies fever, chills or decrease in appetite.  Past Medical History:  Diagnosis Date   Allergy 1990   Anginal pain (HCC)    Anxiety    PANIC ATTACKS   Arthritis    right shoulder   Atherosclerosis of aorta (HCC) 02/12/2015   Atherosclerotic coronary vascular disease 2008   s/p right iliac stent   Cataract    COPD (chronic obstructive pulmonary disease) (HCC)    Cough    Depression    DM (diabetes mellitus) (HCC) 03/10/2023   Dyspnea    Environmental and seasonal allergies    Essential hypertension 02/12/2015   Family history of adverse reaction to anesthesia    Father - 69 - MI, then stroke after Prostate surgery   GERD (gastroesophageal reflux disease)    History of hiatal hernia    Hyperlipidemia    Hypertension    Hypothyroidism    Incisional hernia, without obstruction or gangrene    PVD (peripheral vascular disease) (HCC) 11/17/2016   Venous insufficiency    Vertigo    can happen daily   Wears hearing aid in both ears    has, does not wear    Past Surgical History:  Procedure Laterality Date   CATARACT EXTRACTION W/PHACO Right 03/16/2018   Procedure: CATARACT EXTRACTION PHACO AND INTRAOCULAR LENS PLACEMENT (IOC);  Surgeon: Elliot Cousin, MD;  Location: [facility];  Service: Ophthalmology;  Laterality: Right;  [place]  01:05 CDE 11.85 Fluid pack lot # [id number]   CATARACT EXTRACTION W/PHACO Left 05/08/2020   Procedure: CATARACT EXTRACTION PHACO AND INTRAOCULAR LENS PLACEMENT (IOC) LEFT 7.32 00:54.7 13.4%;  Surgeon: Lockie Mola, MD;  Location: [facility];  Service: Ophthalmology;  Laterality: Left;   CHOLECYSTECTOMY     COLONOSCOPY  2015   Dr Servando Snare- cleared for 5 years    COLONOSCOPY WITH PROPOFOL N/A 01/17/2018   Procedure: COLONOSCOPY WITH PROPOFOL;  Surgeon: Midge Minium, MD;  Location: [facility];  Service: Endoscopy;  Laterality: N/A;  requests early   HERNIA REPAIR  July 2019    INSERTION OF MESH N/A 08/31/2017   Procedure: INSERTION OF MESH;  Surgeon: Ancil Linsey, MD;  Location: [facility];  Service: General;  Laterality: N/A;   IR RADIOLOGIST EVAL & MGMT  06/01/2023   IR RADIOLOGIST EVAL & MGMT  06/08/2023   IR RADIOLOGIST EVAL & MGMT  07/13/2023   IR REMOVAL OF CALCULI/DEBRIS BILIARY DUCT/GB  07/09/2023   IRRIGATION AND DEBRIDEMENT ABSCESS N/A 03/10/2023   Procedure: IRRIGATION AND DEBRIDEMENT ABDOMINAL WALL ABSCESS;  Surgeon: Leafy Ro, MD;  Location: [facility];  Service: General;  Laterality: N/A;   POLYPECTOMY  01/17/2018   Procedure: POLYPECTOMY;  Surgeon: Midge Minium, MD;  Location: [facility];  Service: Endoscopy;;   UMBILICAL HERNIA REPAIR N/A 08/31/2017   Procedure: HERNIA REPAIR UMBILICAL ADULT;  Surgeon: Ancil Linsey, MD;  Location: [facility];  Service: General;  Laterality: N/A;   VEIN SURGERY     1 stents in R) leg    Allergies: Sulfa antibiotics  Medications: Prior to Admission medications   Medication Sig Start Date End Date Taking? Authorizing Provider  acetaminophen (TYLENOL) 325 MG tablet Take 325 mg by mouth daily as needed for moderate pain or headache.   Yes [provider]  albuterol (VENTOLIN HFA) 108 (90 Base) MCG/ACT inhaler Inhale into the lungs. Inhale 2 inhalations into the lungs 4 (four) times daily PRN 05/26/21  Yes [provider]  amLODipine (NORVASC) 5 MG tablet Take 10 mg by mouth daily.   Yes [provider]  azelastine (ASTELIN) 0.1 % nasal spray Place 2 sprays into both nostrils 2 (two) times daily. Use in each nostril as directed 11/03/21  Yes Duanne Limerick, MD  clopidogrel (PLAVIX) 75 MG tablet Take 1 tablet (75 mg total) by mouth daily. 04/05/23  Yes Duanne Limerick, MD  fluticasone (FLONASE) 50 MCG/ACT nasal spray SPRAY 2 SPRAYS INTO EACH NOSTRIL EVERY DAY 04/05/23  Yes Duanne Limerick, MD  levothyroxine (SYNTHROID) 112 MCG tablet Take 1 tablet (112 mcg total) by mouth daily.  04/05/23  Yes Duanne Limerick, MD  losartan (COZAAR) 100 MG tablet Take 100 mg by mouth daily.   Yes [provider]  metFORMIN (GLUCOPHAGE) 500 MG tablet TAKE 1 TABLET BY MOUTH EVERY DAY WITH BREAKFAST 04/05/23  Yes Duanne Limerick, MD  rosuvastatin (CRESTOR) 40 MG tablet Take 1 tablet (40 mg total) by mouth daily. 04/05/23  Yes Duanne Limerick, MD  sertraline (ZOLOFT) 100 MG tablet Take 1 tablet (100 mg total) by mouth daily. 04/05/23  Yes Duanne Limerick, MD  cetirizine (ZYRTEC) 10 MG tablet TAKE 1 TABLET BY MOUTH EVERY DAY Patient not taking: Reported on 07/13/2023 06/03/23   Duanne Limerick, MD  famotidine (PEPCID) 20 MG tablet Take 20 mg by mouth as needed for heartburn or indigestion. Patient not taking: Reported on 07/13/2023    [provider]  HYDROcodone-acetaminophen (NORCO/VICODIN) 5-325 MG tablet Take 1 tablet by mouth every 6 (six) hours as needed for up to 10 doses for moderate pain (pain score 4-6). Patient not  taking: Reported on 07/13/2023 07/09/23   Mickie Kay, NP  ibuprofen (ADVIL,MOTRIN) 200 MG tablet Take 400 mg by mouth every 8 (eight) hours as needed (for pain). Patient not taking: Reported on 07/13/2023    [provider]  mirtazapine (REMERON) 15 MG tablet Take 1 tablet by mouth at bedtime. Patient not taking: Reported on 07/13/2023 06/28/23 06/27/24  [provider]     Family History  Problem Relation Age of Onset   Diabetes Mother    Cancer Father    Heart disease Father    Stroke Father     Social History   Socioeconomic History   Marital status: Married    Spouse name: Khyron, Garno (Spouse)   Number of children: 3   Years of education: Not on file   Highest education level: 12th grade  Occupational History   Occupation: retired  Tobacco Use   Smoking status: Every Day    Current packs/day: 1.50    Average packs/day: 1.5 packs/day for 50.0 years (75.0 ttl pk-yrs)    Types: Cigarettes    Passive exposure: Past    Smokeless tobacco: Never  Vaping Use   Vaping status: Never Used  Substance and Sexual Activity   Alcohol use: Yes    Comment: once a month   Drug use: No   Sexual activity: Not Currently  Other Topics Concern   Not on file  Social History Narrative   Lives with wife, Harriett Sine.   Social Drivers of Corporate investment banker Strain: Low Risk  (06/28/2023)   Received from [facility]   Overall Financial Resource Strain (CARDIA)    Difficulty of Paying Living Expenses: Not very hard  Food Insecurity: No Food Insecurity (06/28/2023)   Received from [facility]   Hunger Vital Sign    Worried About Running Out of Food in the Last Year: Never true    Ran Out of Food in the Last Year: Never true  Transportation Needs: No Transportation Needs (06/28/2023)   Received from [facility] - Transportation    In the past 12 months, has lack of transportation kept you from medical appointments or from getting medications?: No    Lack of Transportation (Non-Medical): No  Physical Activity: Unknown (04/05/2023)   Exercise Vital Sign    Days of Exercise per Week: 1 day    Minutes of Exercise per Session: Patient declined  Stress: Patient Declined (04/05/2023)   [employer] of Occupational Health - Occupational Stress Questionnaire    Feeling of Stress : Patient declined  Social Connections: Unknown (04/05/2023)   Social Connection and Isolation Panel [NHANES]    Frequency of Communication with Friends and Family: Patient declined    Frequency of Social Gatherings with Friends and Family: Patient declined    Attends Religious Services: Patient declined    Database administrator or Organizations: Patient declined    Attends Engineer, structural: Not on file    Marital Status: Patient declined    Review of Systems: A 12 point ROS discussed and pertinent positives are indicated in the HPI above.  All other systems are  negative.  Review of Systems  Vital Signs: BP (!) 149/92   Pulse (!) 101   Temp 97.7 F (36.5 C)   Resp 16   SpO2 97%   Advance Care Plan: The advanced care plan/surrogate decision maker was discussed at the time of visit and the patient did not  wish to discuss or was not able to name a surrogate decision maker or provide an advance care plan.    Physical Exam Constitutional:      General: He is not in acute distress.    Appearance: Normal appearance. He is normal weight.  HENT:     Head: Normocephalic and atraumatic.  Eyes:     General: No scleral icterus. Cardiovascular:     Rate and Rhythm: Normal rate.  Pulmonary:     Effort: Pulmonary effort is normal.  Abdominal:     General: There is no distension.     Palpations: Abdomen is soft.     Tenderness: There is no abdominal tenderness. There is no guarding.       Comments: Drain output is scant and mildly cloudy.   Neurological:     Mental Status: He is alert.      Imaging: IR Radiologist Eval & Mgmt Result Date: 07/13/2023 EXAM: ESTABLISHED PATIENT OFFICE VISIT CHIEF COMPLAINT: SEE NOTE IN EPIC HISTORY OF PRESENT ILLNESS: SEE NOTE IN EPIC REVIEW OF SYSTEMS: SEE NOTE IN EPIC PHYSICAL EXAMINATION: SEE NOTE IN EPIC ASSESSMENT AND PLAN: SEE NOTE IN EPIC Electronically Signed   By: Malachy Moan M.D.   On: 07/13/2023 11:45   IR REMOVAL OF CALCULI/DEBRIS BILIARY DUCT/GB Result Date: 07/09/2023 INDICATION: 73 year old male with a history of retained gallstones following cholecystectomy several years previously. He has had recurrent abscesses. He currently has 2 percutaneous drains in place with the drainage catheters located at the site of the retained stones. He presents today for attempted spyglass endoscopy and potential stone removal. EXAM: Spyglass endoscopy, attempted removal of retained gallstones Abscess drain upsize and exchange Abscess drain upsize and exchange MEDICATIONS: None. ANESTHESIA/SEDATION: Deep  sedation with propofol all provided by the anesthesiology department. FLUOROSCOPY: Radiation Exposure Index (as provided by the fluoroscopic device): 58.2 mGy Kerma COMPLICATIONS: None immediate. PROCEDURE: Informed written consent was obtained from the patient after a thorough discussion of the procedural risks, benefits and alternatives. All questions were addressed. Maximal Sterile Barrier Technique was utilized including caps, mask, sterile gowns, sterile gloves, sterile drape, hand hygiene and skin antiseptic. A timeout was performed prior to the initiation of the procedure. The retention sutures of the existing drains were cut and released. The drains were transected and removed over stiff Amplatz wires. Local anesthesia was attained by infiltration with 1% lidocaine. Additional dermatotomy is were made to widen the drain entry site. The soft tissue tract were then serially dilated to 18 [place]. Twenty French peel-away sheaths were then carefully advanced over the wires and into the prior abscess cavities. The 10.5 French spyglass endoscopy catheter was then advanced through the 20 [place] peel-away sheath directed into the perihepatic abscess. After significant manipulation, at least 4 stones could be identified. Unfortunately, the stones are completely imbedded in a wall of fibrous tissue. Efforts were made to advanced of basket through the fibrous tissue to grasp the stones. This was not successful. The tip of the basket was used in an effort to break up the fibrous tissue. This was also not successful. The spyglass was removed and advanced through the second 20 French sheath leading to the right abdominal wall abscess cavity. Lavage and evaluation was performed. Unfortunately, the stones could not be visualized in this collection likely due to the smaller size of the cavity. Both cavities were copiously lavaged with saline. Betadine was then lavaged into the cavities and allowed to remain in site for 10  minutes. Additional saline lavage  was then performed in till the Betadine was completely cleared. The spyglass scope was removed. Wires were advanced through the peel-away sheaths and 14 French drainage catheters advanced over the wires and formed. The catheters were secured to the skin with 0 Prolene suture and connected to JP bulb suction. IMPRESSION: 1. Attempted spyglass removal of retained gallstones from 2 separate abscess cavities. The retained stones could be visualized but could not be removed as they were completely imbedded within fibrous tissue. 2. Lavage of the abscess cavities and attempted sterilization with Betadine dwell. 3. Exchange and up size of both abscess drains to 14 French drainage catheters. Electronically Signed   By: Malachy Moan M.D.   On: 07/09/2023 13:23   CT GUIDED PERITONEAL/RETROPERITONEAL FLUID DRAIN BY PERC CATH Result Date: 06/21/2023 INDICATION: Recurrent intra-abdominal abscesses due to retained gallstones. He presents for repeat drain placement. EXAM: Placement of 2 individual drainage catheters TECHNIQUE: Multidetector CT imaging of the abdomen was performed following the standard protocol without IV contrast. RADIATION DOSE REDUCTION: This exam was performed according to the departmental dose-optimization program which includes automated exposure control, adjustment of the mA and/or kV according to patient size and/or use of iterative reconstruction technique. MEDICATIONS: None. ANESTHESIA/SEDATION: Moderate (conscious) sedation was employed during this procedure. A total of Versed 2 mg and Fentanyl 100 mcg was administered intravenously by the radiology nurse. Total intra-service moderate Sedation Time: 28 minutes. The patient's level of consciousness and vital signs were monitored continuously by radiology nursing throughout the procedure under my direct supervision. COMPLICATIONS: None immediate. PROCEDURE: Informed written consent was obtained from the patient  after a thorough discussion of the procedural risks, benefits and alternatives. All questions were addressed. Maximal Sterile Barrier Technique was utilized including caps, mask, sterile gowns, sterile gloves, sterile drape, hand hygiene and skin antiseptic. A timeout was performed prior to the initiation of the procedure. Axial CT imaging was obtained. Both fluid collections were identified. There is been a significant change compared to the relatively recent prior CT scan of the abdomen and pelvis dated 06/15/2023. The superficial collection spontaneously ruptured and decompressed resulting in decompression of all 3 abscess cavities. There is still some residual fluid and the retained stones present. Both the right sub peritoneal space and the right perihepatic space collections were targeted. Local anesthesia was attained by infiltration with 1% lidocaine. Small dermatotomy is were made. Using intermittent CT guidance, 18 gauge trocar needles were carefully advanced into both fluid collections. 0.035 wires were advanced into both fluid collections. The percutaneous tract were dilated to 12 [place]. Twelve [place] cook drains were then advanced over the wires and formed in each collection. Post placement CT imaging demonstrates successful placement of the 2 drainage catheters. The catheters were gently flushed and secured to the skin with 0 silk sutures. Both drains were connected to JP bulb suction. IMPRESSION: 1. Placement of a 12 French drainage catheter into the perihepatic abscess. 2. Placement of a 12 French drainage catheter into the right lateral abdominal intra peritoneal collection. Electronically Signed   By: Malachy Moan M.D.   On: 06/21/2023 12:59   CT GUIDED PERITONEAL/RETROPERITONEAL FLUID DRAIN BY PERC CATH Result Date: 06/21/2023 INDICATION: Recurrent intra-abdominal abscesses due to retained gallstones. He presents for repeat drain placement. EXAM: Placement of 2 individual drainage  catheters TECHNIQUE: Multidetector CT imaging of the abdomen was performed following the standard protocol without IV contrast. RADIATION DOSE REDUCTION: This exam was performed according to the departmental dose-optimization program which includes automated exposure control, adjustment of  the mA and/or kV according to patient size and/or use of iterative reconstruction technique. MEDICATIONS: None. ANESTHESIA/SEDATION: Moderate (conscious) sedation was employed during this procedure. A total of Versed 2 mg and Fentanyl 100 mcg was administered intravenously by the radiology nurse. Total intra-service moderate Sedation Time: 28 minutes. The patient's level of consciousness and vital signs were monitored continuously by radiology nursing throughout the procedure under my direct supervision. COMPLICATIONS: None immediate. PROCEDURE: Informed written consent was obtained from the patient after a thorough discussion of the procedural risks, benefits and alternatives. All questions were addressed. Maximal Sterile Barrier Technique was utilized including caps, mask, sterile gowns, sterile gloves, sterile drape, hand hygiene and skin antiseptic. A timeout was performed prior to the initiation of the procedure. Axial CT imaging was obtained. Both fluid collections were identified. There is been a significant change compared to the relatively recent prior CT scan of the abdomen and pelvis dated 06/15/2023. The superficial collection spontaneously ruptured and decompressed resulting in decompression of all 3 abscess cavities. There is still some residual fluid and the retained stones present. Both the right sub peritoneal space and the right perihepatic space collections were targeted. Local anesthesia was attained by infiltration with 1% lidocaine. Small dermatotomy is were made. Using intermittent CT guidance, 18 gauge trocar needles were carefully advanced into both fluid collections. 0.035 wires were advanced into both  fluid collections. The percutaneous tract were dilated to 12 [place]. Twelve [place] cook drains were then advanced over the wires and formed in each collection. Post placement CT imaging demonstrates successful placement of the 2 drainage catheters. The catheters were gently flushed and secured to the skin with 0 silk sutures. Both drains were connected to JP bulb suction. IMPRESSION: 1. Placement of a 12 French drainage catheter into the perihepatic abscess. 2. Placement of a 12 French drainage catheter into the right lateral abdominal intra peritoneal collection. Electronically Signed   By: Malachy Moan M.D.   On: 06/21/2023 12:59   CT ABDOMEN PELVIS W CONTRAST Result Date: 06/15/2023 CLINICAL DATA:  Right incisional swelling 2 drains in right upper quadrant area that were recently removed, right upper quadrant tenderness EXAM: CT ABDOMEN AND PELVIS WITH CONTRAST TECHNIQUE: Multidetector CT imaging of the abdomen and pelvis was performed using the standard protocol following bolus administration of intravenous contrast. RADIATION DOSE REDUCTION: This exam was performed according to the departmental dose-optimization program which includes automated exposure control, adjustment of the mA and/or kV according to patient size and/or use of iterative reconstruction technique. CONTRAST:  OMNIPAQUE IOHEXOL 300 MG/ML  SOLN COMPARISON:  CT 06/08/2023, 06/01/2023 FINDINGS: Lower chest: Lung bases demonstrate no acute airspace disease. Stable 4 mm right lung base Peri fissural nodule, no imaging follow-up recommended. Hepatobiliary: Cholecystectomy. No biliary dilatation. No focal hepatic abnormality Pancreas: Unremarkable. No pancreatic ductal dilatation or surrounding inflammatory changes. Spleen: Normal in size without focal abnormality. Adrenals/Urinary Tract: Adrenal glands are unremarkable. Kidneys are normal, without renal calculi, focal lesion, or hydronephrosis. Bladder is unremarkable. Stomach/Bowel:  Stomach is within normal limits. Appendix appears normal. No evidence of bowel wall thickening, distention, or inflammatory changes. Diverticular disease of sigmoid colon without definite inflammatory process. Vascular/Lymphatic: Aortic atherosclerosis. No enlarged abdominal or pelvic lymph nodes. Reproductive: Prostate is unremarkable. Other: Negative for pelvic effusion or free air. Interim removal of right sided percutaneous drainage catheters. Increased right Peri hepatic thick-walled fluid collection with small calcifications, this measures approximately 9.1 by 1.4 cm. Increased size of thick walled rim enhancing fluid collection slightly more caudal  and within the superficial abdominal wall, this measures 5.1 by 2 cm. Soft tissue thickening tracks to the right lower quadrant. Musculoskeletal: No acute or suspicious osseous abnormality. IMPRESSION: 1. Interim removal of right-sided percutaneous drainage catheters. Increased size/reaccumulation of right Peri hepatic thick-walled fluid collection/abscess with small calcifications, measuring up to 9.1 cm. Increased size of thick walled rim enhancing fluid collection/abscess slightly more caudal and within the superficial abdominal wall, measuring up to 5.1 cm. 2. Diverticular disease of sigmoid colon without definite inflammatory process. 3. Aortic atherosclerosis. Aortic Atherosclerosis (ICD10-I70.0). Electronically Signed   By: Jasmine Pang M.D.   On: 06/15/2023 17:24    Labs:  CBC: Recent Labs    03/11/23 0444 05/17/23 1241 06/21/23 1033 07/09/23 0944  WBC 8.5 9.1 7.6 9.6  HGB 11.5* 14.4 13.9 14.6  HCT 34.4* 44.6 42.3 43.8  PLT 397 413* 415* 363    COAGS: Recent Labs    03/10/23 0902 05/17/23 1241 06/21/23 1033 07/09/23 0944  INR 1.1 1.1 1.1 1.0    BMP: Recent Labs    03/09/23 1645 03/10/23 0337 03/11/23 0444 04/26/23 1352 06/15/23 1454 07/09/23 0944  NA 134* 136 135  --   --  140  K 3.5 3.4* 3.4*  --   --  3.8  CL 102  105 102  --   --  105  CO2 23 24 23   --   --  24  GLUCOSE 141* 111* 113*  --   --  119*  BUN 13 9 16   --   --  14  CALCIUM 8.8* 8.7* 8.4*  --   --  9.6  CREATININE 0.67 0.58* 0.56* 0.80 0.60* 0.54*  GFRNONAA >60 >60 >60  --   --  >60    LIVER FUNCTION TESTS: Recent Labs    11/27/22 1103 03/09/23 1645 03/10/23 0337  BILITOT  --  0.3 0.4  AST  --  18 15  ALT  --  10 9  ALKPHOS  --  86 78  PROT  --  7.6 6.9  ALBUMIN 3.8 3.6 3.4*    TUMOR MARKERS: No results for input(s): "AFPTM", "CEA", "CA199", "CHROMGRNA" in the last 8760 hours.  Assessment and Plan:  Extremely pleasant but unfortunate 73 year old gentleman with retained gallstones with associated chronic intraperitoneal abscesses in the perihepatic and right lower quadrant abdominal wall.  He has undergone drain placement on 2 separate occasions due to recurrent abscess and most recently underwent an attempted endoscopic extraction of the stones using the spyglass biliary endoscope.  Unfortunately, this was unsuccessful due to adherence of the stones to the peritoneum/scar wall.  Currently, he is doing well with only mild discomfort.  Both drains remain in place.  I discussed with him that our next option is likely surgical extraction.  His surgery was performed by Dr. Selena Lesser at [facility] in 2018.  I will reach out to her and attempt to make a referral.  If she is unavailable, I will consider referring Mr. Balbi to one of my surgical colleagues in [place].  1.) Maintain drains in place for now and continue flushing.   2.) Please refer patient to [facility] - Dr. Leone Haven who has taken over for Dr. Lucita Lora and operates out of the [facility] facility in nearby [place].  I have spoken to Dr. Joselyn Glassman directly and he is expecting Mr. Thune.    Electronically Signed: [name] 07/13/2023, 12:41 PM   I spent a total of  15 Minutes in face  to face in clinical consultation, greater than 50% of which was  counseling/coordinating care for retained gallstones, drain sin place

## 2023-07-19 ENCOUNTER — Encounter: Payer: Self-pay | Admitting: Surgery

## 2023-07-19 ENCOUNTER — Ambulatory Visit: Admitting: Surgery

## 2023-07-19 VITALS — BP 156/90 | HR 103 | Temp 98.2°F | Ht 68.0 in | Wt 149.0 lb

## 2023-07-19 DIAGNOSIS — L02211 Cutaneous abscess of abdominal wall: Secondary | ICD-10-CM

## 2023-07-19 NOTE — Patient Instructions (Signed)
 We will get you scheduled for a CT scan and send a referral to Dr Ruben Im at [facility] for her to see you.  We will call you about the CT scan.  [facility] will call you to set this appointment up.

## 2023-07-20 ENCOUNTER — Encounter: Payer: Self-pay | Admitting: Surgery

## 2023-07-20 ENCOUNTER — Telehealth: Payer: Self-pay

## 2023-07-20 NOTE — Telephone Encounter (Signed)
 Call to patient regarding his CT scan. The patient is scheduled for a CT scan at [facility] on 07/30/23. He will need to arrive at the [facility] entrance at 11:15 am and check in with the Radiology desk.

## 2023-07-20 NOTE — Progress Notes (Signed)
 Outpatient Surgical Follow Up    Joel Flores is an 73 y.o. male.   Chief Complaint  Patient presents with   Follow-up    HPI: Joel Flores is 6 MONTHS out from incision and drainage of complex abdominal wall and perihepatic abscess. I did I/D and also he had two drain placed by IR, the drains were removed then he reaccumulated the fluid collection.  I did obtain a recent CT scan that I have personally reviewed showing evidence of reaccumulation of abscesses.  He recently had 2 drains placed by IR as well. He will also had an attempted spyglass blasting of the stones but he was unsuccessful.  He has no fevers no chills he is tolerating diet and he is not toxic.  He has completed antibiotic therapy, no abdominal pain. He is ambulating he has not had any fevers or chills He continues to smoke  Past Medical History:  Diagnosis Date   Allergy 1990   Anginal pain (HCC)    Anxiety    PANIC ATTACKS   Arthritis    right shoulder   Atherosclerosis of aorta (HCC) 02/12/2015   Atherosclerotic coronary vascular disease 2008   s/p right iliac stent   Cataract    COPD (chronic obstructive pulmonary disease) (HCC)    Cough    Depression    DM (diabetes mellitus) (HCC) 03/10/2023   Dyspnea    Environmental and seasonal allergies    Essential hypertension 02/12/2015   Family history of adverse reaction to anesthesia    Father - 74 - MI, then stroke after Prostate surgery   GERD (gastroesophageal reflux disease)    History of hiatal hernia    Hyperlipidemia    Hypertension    Hypothyroidism    Incisional hernia, without obstruction or gangrene    PVD (peripheral vascular disease) (HCC) 11/17/2016   Venous insufficiency    Vertigo    can happen daily   Wears hearing aid in both ears    has, does not wear    Past Surgical History:  Procedure Laterality Date   CATARACT EXTRACTION W/PHACO Right 03/16/2018   Procedure: CATARACT EXTRACTION PHACO AND INTRAOCULAR LENS PLACEMENT (IOC);   Surgeon: Joel Cousin, MD;  Location: [facility];  Service: Ophthalmology;  Laterality: Right;  [place]  01:05 CDE 11.85 Fluid pack lot # [id number]   CATARACT EXTRACTION W/PHACO Left 05/08/2020   Procedure: CATARACT EXTRACTION PHACO AND INTRAOCULAR LENS PLACEMENT (IOC) LEFT 7.32 00:54.7 13.4%;  Surgeon: Joel Mola, MD;  Location: [facility];  Service: Ophthalmology;  Laterality: Left;   CHOLECYSTECTOMY     COLONOSCOPY  2015   Dr Joel Flores- cleared for 5 years    COLONOSCOPY WITH PROPOFOL N/A 01/17/2018   Procedure: COLONOSCOPY WITH PROPOFOL;  Surgeon: Joel Minium, MD;  Location: [facility];  Service: Endoscopy;  Laterality: N/A;  requests early   HERNIA REPAIR  July 2019   INSERTION OF MESH N/A 08/31/2017   Procedure: INSERTION OF MESH;  Surgeon: Joel Linsey, MD;  Location: [facility];  Service: General;  Laterality: N/A;   IR RADIOLOGIST EVAL & MGMT  06/01/2023   IR RADIOLOGIST EVAL & MGMT  06/08/2023   IR RADIOLOGIST EVAL & MGMT  07/13/2023   IR REMOVAL OF CALCULI/DEBRIS BILIARY DUCT/GB  07/09/2023   IRRIGATION AND DEBRIDEMENT ABSCESS N/A 03/10/2023   Procedure: IRRIGATION AND DEBRIDEMENT ABDOMINAL WALL ABSCESS;  Surgeon: Joel Ro, MD;  Location: [facility];  Service: General;  Laterality: N/A;   POLYPECTOMY  01/17/2018  Procedure: POLYPECTOMY;  Surgeon: Joel Minium, MD;  Location: [facility];  Service: Endoscopy;;   UMBILICAL HERNIA REPAIR N/A 08/31/2017   Procedure: HERNIA REPAIR UMBILICAL ADULT;  Surgeon: Joel Linsey, MD;  Location: [facility] OR[facility];  Service: General;  Laterality: N/A;   VEIN SURGERY     1 stents in R) leg    Family History  Problem Relation Age of Onset   Diabetes Mother    Cancer Father    Heart disease Father    Stroke Father     Social History:  reports that he has been smoking cigarettes. He has a 75 pack-year smoking history. He has been exposed to tobacco smoke. He has never used smokeless tobacco. He reports current  alcohol use. He reports that he does not use drugs.  Allergies:  Allergies  Allergen Reactions   Sulfa Antibiotics Rash    Medications reviewed.    ROS Full ROS performed and is otherwise negative other than what is stated in HPI   BP (!) 156/90   Pulse (!) 103   Temp 98.2 F (36.8 C)   Ht 5\' 8"  (1.727 m)   Wt 149 lb (67.6 kg)   SpO2 98%   BMI 22.66 kg/m   Physical Exam  Physical Exam  Vitals and nursing note reviewed. Exam conducted with a chaperone present.  Constitutional:      Appearance: Normal appearance. He is not ill-appearing or diaphoretic.  Abdominal:     General: Abdomen is flat. There is no distension.     Palpations: Abdomen is soft. T there is evidence of 2 drains with seropurulent fluid.  No evidence of necrotizing infection or definitive collection on exam there is no evidence of peritonitis   musculoskeletal:        General: No swelling or tenderness. Normal range of motion.     Cervical back: Normal range of motion and neck supple. No rigidity or tenderness.  Skin:    General: Skin is warm and dry.     Capillary Refill: Capillary refill takes less than 2 seconds.  Neurological:     General: No focal deficit present.     Mental Status: He is alert and oriented to person, place, and time.  Psychiatric:        Mood and Affect: Mood normal.        Behavior: Behavior normal.        Thought Content: Thought content normal.        Judgment: Judgment normal.      Assessment/Plan: [name] is a 73 year old male recurrent abdominal abscesses w retained stone.  I had an extensive discussion about his disease process I do think that he will probably require some form of debridement.  I also discussed with him that there is a good chance that even with an open debridement we will not be able to fully remove the retained stones.  Options given to the patient including referral to a tertiary center with his original surgeon.  Patient wishes to go to his original  Careers adviser at [facility].  I do think that this is a good idea given the complexity and recurrence of this abscesses that may require hepatobiliary expertise.  Also at [facility] they have more resources to deal with this complex and rare event For now we will like to make sure there is no other undrained collections and we will obtain a CT scan of the abdomen and pelvis with contrast.  Please note that I spent 40 minutes  in this encounter including imaging studies, coordinating also encouraging to      [name], MD [facility] General Surgeon

## 2023-07-26 ENCOUNTER — Telehealth: Payer: Self-pay | Admitting: Interventional Radiology

## 2023-07-26 ENCOUNTER — Other Ambulatory Visit (INDEPENDENT_AMBULATORY_CARE_PROVIDER_SITE_OTHER): Payer: Self-pay | Admitting: Nurse Practitioner

## 2023-07-26 ENCOUNTER — Other Ambulatory Visit: Payer: Self-pay | Admitting: Urology

## 2023-07-26 ENCOUNTER — Other Ambulatory Visit: Payer: Self-pay

## 2023-07-26 DIAGNOSIS — I739 Peripheral vascular disease, unspecified: Secondary | ICD-10-CM

## 2023-07-26 DIAGNOSIS — I6523 Occlusion and stenosis of bilateral carotid arteries: Secondary | ICD-10-CM

## 2023-07-26 MED ORDER — NORMAL SALINE FLUSH 0.9 % IV SOLN
INTRAVENOUS | 3 refills | Status: AC
  Filled 2023-07-26: qty 600, 30d supply, fill #0

## 2023-07-26 NOTE — Telephone Encounter (Signed)
 Patient reached out to IR department at [facility] today as he is nearing his last few flushes. Refill order placed. Patient aware he will have to pick up at [facility] outpatient pharmacy.

## 2023-07-27 ENCOUNTER — Ambulatory Visit (INDEPENDENT_AMBULATORY_CARE_PROVIDER_SITE_OTHER): Payer: Medicare PPO | Admitting: Vascular Surgery

## 2023-07-27 ENCOUNTER — Encounter (INDEPENDENT_AMBULATORY_CARE_PROVIDER_SITE_OTHER): Payer: Medicare PPO

## 2023-07-30 ENCOUNTER — Ambulatory Visit
Admission: RE | Admit: 2023-07-30 | Discharge: 2023-07-30 | Disposition: A | Discharge status: Home / Self Care | Source: Ambulatory Visit | Attending: Surgery | Admitting: Surgery

## 2023-07-30 DIAGNOSIS — L02211 Cutaneous abscess of abdominal wall: Secondary | ICD-10-CM | POA: Insufficient documentation

## 2023-07-30 MED ORDER — IOHEXOL 300 MG/ML  SOLN
100.0000 mL | Freq: Once | INTRAMUSCULAR | Status: AC | PRN
  Administered 2023-07-30: 100 mL via INTRAVENOUS

## 2023-08-04 ENCOUNTER — Other Ambulatory Visit: Payer: Self-pay | Admitting: Surgery

## 2023-08-04 DIAGNOSIS — L02211 Cutaneous abscess of abdominal wall: Secondary | ICD-10-CM

## 2023-08-05 ENCOUNTER — Other Ambulatory Visit: Payer: Self-pay | Admitting: Surgery

## 2023-08-05 ENCOUNTER — Ambulatory Visit
Admission: RE | Admit: 2023-08-05 | Discharge: 2023-08-05 | Disposition: A | Discharge status: Home / Self Care | Source: Ambulatory Visit | Attending: Surgery | Admitting: Surgery

## 2023-08-05 ENCOUNTER — Other Ambulatory Visit

## 2023-08-05 DIAGNOSIS — L02211 Cutaneous abscess of abdominal wall: Secondary | ICD-10-CM

## 2023-08-05 HISTORY — PX: IR RADIOLOGIST EVAL & MGMT: IMG5224

## 2023-08-05 HISTORY — PX: IR CATHETER TUBE CHANGE: IMG717

## 2023-08-05 MED ORDER — IOPAMIDOL (ISOVUE-300) INJECTION 61%
10.0000 mL | Freq: Once | INTRAVENOUS | Status: AC | PRN
  Administered 2023-08-05: 10 mL

## 2023-08-05 MED ORDER — LIDOCAINE HCL (PF) 1 % IJ SOLN
10.0000 mL | Freq: Once | INTRAMUSCULAR | Status: AC
  Administered 2023-08-05: 10 mL via INTRADERMAL

## 2023-08-05 NOTE — Progress Notes (Signed)
 Referring Physician(s): Pabon,Diego F  Chief Complaint: The patient is seen in follow up today for gallstones with recurrent abscesses   History of present illness: Joel Flores [place] is a 73 y.o. male with a medical history significant for tobacco use (1-1/2 pack/day for the past 50 years), hypertension, GERD, hypothyroidism, anxiety, bilateral carotid artery stenosis, colonic polyps, diabetes mellitus type 2, melanoma, peripheral vascular disease (Plavix), and history of lap cholecystectomy for perforated gallbladder in 2018. He initially presented to the ED 03/10/23 with complaints of left flank pain and swelling that had started in August. CT scan showed a perihepatic multi septated abscess that was likely due to a walled-off dropped gallstone from his cholecystectomy.    He was taken to the OR for I&D but was evaluated by Surgery again 04/19/23 for drainage from the surgical wound. CT imaging showed a large, complex, persistent abdominal wall and perihepatic abscess which had increased in size from prior imaging. Interventional Radiology was consulted for drain placement with potential removal of gallstones after abscess resolution. On 05/17/23 Dr. Archer Asa two separate 12 Fr drains were placed into the right perihepatic and right mid-abdominal wall abscesses.    He followed up with Dr. Archer Asa 06/01/23 and the abscesses had essentially resolved. Furthermore, the previously identified high attenuation gallstones were no longer visible on CT imaging. The patient and his wife had been noting small dark flecks coming out of the drainage from the tubes and these likely represented fragmentation of the underlying gallstones. The drains were left in place and the patient was instructed to pursue a more aggressive flushing strategy.     The patient followed up again with Dr. Archer Asa 06/08/23 and was doing extremely well. The patient reported no further output of dark stone fragments and the drain output  was clear. CT imaging still showed resolution of the abscesses and no evidence of gallstones. The drains were removed and and no further follow up was scheduled with IR.    Joel Flores followed up with his Dr. Everlene Farrier 06/14/23 and was still doing well but had some fullness in his right upper abdomen. CT  imaging 06/15/23 showed "Increased size/reaccumulation of right Peri hepatic thick-walled fluid collection/abscess with small calcifications, measuring up to 9.1 cm. Increased size of thick walled rim enhancing fluid collection/abscess slightly more caudal and within the superficial abdominal wall, measuring up to 5.1 cm."   He was referred back to Interventional Radiology for placement of drainage catheters and this was performed 06/21/23. He returned to IR 07/09/23 and Dr. Archer Asa attempted removal of the retained gallstones. The retained stones could be visualized but could not be removed as they were completely imbedded within fibrous tissue. Dr. Archer Asa lavaged the abscess cavities and attempted sterilization with a betadine dwell. Both abscess drains were exchanged and upsized as well.   Dr. Archer Asa re-evaluated the patient 07/13/23 and the patient was doing well. They discussed possibly referring the patient back to the surgeon who performed his initial procedure in 2018. The patient was instructed to maintain current drain care.   Joel Flores presents to the Interventional Radiology outpatient clinic for drain evaluations and exchange of one of the drains due to discomfort.    Past Medical History:  Diagnosis Date   Allergy 1990   Anginal pain (HCC)    Anxiety    PANIC ATTACKS   Arthritis    right shoulder   Atherosclerosis of aorta (HCC) 02/12/2015   Atherosclerotic coronary vascular disease 2008   s/p right iliac stent  Cataract    COPD (chronic obstructive pulmonary disease) (HCC)    Cough    Depression    DM (diabetes mellitus) (HCC) 03/10/2023   Dyspnea    Environmental and  seasonal allergies    Essential hypertension 02/12/2015   Family history of adverse reaction to anesthesia    Father - 22 - MI, then stroke after Prostate surgery   GERD (gastroesophageal reflux disease)    History of hiatal hernia    Hyperlipidemia    Hypertension    Hypothyroidism    Incisional hernia, without obstruction or gangrene    PVD (peripheral vascular disease) (HCC) 11/17/2016   Venous insufficiency    Vertigo    can happen daily   Wears hearing aid in both ears    has, does not wear    Past Surgical History:  Procedure Laterality Date   CATARACT EXTRACTION W/PHACO Right 03/16/2018   Procedure: CATARACT EXTRACTION PHACO AND INTRAOCULAR LENS PLACEMENT (IOC);  Surgeon: Elliot Cousin, MD;  Location: [facility];  Service: Ophthalmology;  Laterality: Right;  [place]  01:05 CDE 11.85 Fluid pack lot # [id number]   CATARACT EXTRACTION W/PHACO Left 05/08/2020   Procedure: CATARACT EXTRACTION PHACO AND INTRAOCULAR LENS PLACEMENT (IOC) LEFT 7.32 00:54.7 13.4%;  Surgeon: Lockie Mola, MD;  Location: [facility];  Service: Ophthalmology;  Laterality: Left;   CHOLECYSTECTOMY     COLONOSCOPY  2015   Dr Servando Snare- cleared for 5 years    COLONOSCOPY WITH PROPOFOL N/A 01/17/2018   Procedure: COLONOSCOPY WITH PROPOFOL;  Surgeon: Midge Minium, MD;  Location: [facility];  Service: Endoscopy;  Laterality: N/A;  requests early   HERNIA REPAIR  July 2019   INSERTION OF MESH N/A 08/31/2017   Procedure: INSERTION OF MESH;  Surgeon: Ancil Linsey, MD;  Location: [facility];  Service: General;  Laterality: N/A;   IR CATHETER TUBE CHANGE  08/05/2023   IR RADIOLOGIST EVAL & MGMT  06/01/2023   IR RADIOLOGIST EVAL & MGMT  06/08/2023   IR RADIOLOGIST EVAL & MGMT  07/13/2023   IR RADIOLOGIST EVAL & MGMT  08/05/2023   IR REMOVAL OF CALCULI/DEBRIS BILIARY DUCT/GB  07/09/2023   IRRIGATION AND DEBRIDEMENT ABSCESS N/A 03/10/2023   Procedure: IRRIGATION AND DEBRIDEMENT ABDOMINAL WALL ABSCESS;   Surgeon: Leafy Ro, MD;  Location: [facility];  Service: General;  Laterality: N/A;   POLYPECTOMY  01/17/2018   Procedure: POLYPECTOMY;  Surgeon: Midge Minium, MD;  Location: [facility];  Service: Endoscopy;;   UMBILICAL HERNIA REPAIR N/A 08/31/2017   Procedure: HERNIA REPAIR UMBILICAL ADULT;  Surgeon: Ancil Linsey, MD;  Location: [facility];  Service: General;  Laterality: N/A;   VEIN SURGERY     1 stents in R) leg    Allergies: Sulfa antibiotics  Medications: Prior to Admission medications   Medication Sig Start Date End Date Taking? Authorizing Provider  acetaminophen (TYLENOL) 325 MG tablet Take 325 mg by mouth daily as needed for moderate pain or headache.    [provider]  albuterol (VENTOLIN HFA) 108 (90 Base) MCG/ACT inhaler Inhale into the lungs. Inhale 2 inhalations into the lungs 4 (four) times daily PRN 05/26/21   [provider]  amLODipine (NORVASC) 5 MG tablet Take 10 mg by mouth daily.    [provider]  azelastine (ASTELIN) 0.1 % nasal spray Place 2 sprays into both nostrils 2 (two) times daily. Use in each nostril as directed 11/03/21   Duanne Limerick, MD  cetirizine (ZYRTEC) 10 MG  tablet TAKE 1 TABLET BY MOUTH EVERY DAY 06/03/23   Duanne Limerick, MD  clopidogrel (PLAVIX) 75 MG tablet Take 1 tablet (75 mg total) by mouth daily. 04/05/23   Duanne Limerick, MD  fluticasone (FLONASE) 50 MCG/ACT nasal spray SPRAY 2 SPRAYS INTO EACH NOSTRIL EVERY DAY 04/05/23   Duanne Limerick, MD  HYDROcodone-acetaminophen (NORCO/VICODIN) 5-325 MG tablet Take 1 tablet by mouth every 6 (six) hours as needed for up to 10 doses for moderate pain (pain score 4-6). 07/09/23   Mickie Kay, NP  ibuprofen (ADVIL,MOTRIN) 200 MG tablet Take 400 mg by mouth every 8 (eight) hours as needed (for pain).    [provider]  levothyroxine (SYNTHROID) 112 MCG tablet Take 1 tablet (112 mcg total) by mouth daily. 04/05/23   Duanne Limerick, MD  losartan  (COZAAR) 100 MG tablet Take 100 mg by mouth daily.    [provider]  metFORMIN (GLUCOPHAGE) 500 MG tablet TAKE 1 TABLET BY MOUTH EVERY DAY WITH BREAKFAST 04/05/23   Duanne Limerick, MD  mirtazapine (REMERON) 15 MG tablet Take 1 tablet by mouth at bedtime. 06/28/23 06/27/24  [provider]  pantoprazole (PROTONIX) 40 MG tablet Take 40 mg by mouth daily. 07/06/23   [provider]  rosuvastatin (CRESTOR) 40 MG tablet Take 1 tablet (40 mg total) by mouth daily. 04/05/23   Duanne Limerick, MD  sertraline (ZOLOFT) 100 MG tablet Take 1 tablet (100 mg total) by mouth daily. 04/05/23   Duanne Limerick, MD  Sodium Chloride Flush (NORMAL SALINE FLUSH) 0.9 % SOLN Flush each drain with 10 ml of normal saline two times a day. 07/26/23   Carim, Charles A, PA-C  Vitamin D, Ergocalciferol, (DRISDOL) 1.25 MG (50000 UNIT) CAPS capsule Take 50,000 Units by mouth once a week. 06/30/23   [provider]     Family History  Problem Relation Age of Onset   Diabetes Mother    Cancer Father    Heart disease Father    Stroke Father     Social History   Socioeconomic History   Marital status: Married    Spouse name: Esdras, Delair (Spouse)   Number of children: 3   Years of education: Not on file   Highest education level: 12th grade  Occupational History   Occupation: retired  Tobacco Use   Smoking status: Every Day    Current packs/day: 1.50    Average packs/day: 1.5 packs/day for 50.0 years (75.0 ttl pk-yrs)    Types: Cigarettes    Passive exposure: Past   Smokeless tobacco: Never  Vaping Use   Vaping status: Never Used  Substance and Sexual Activity   Alcohol use: Yes    Comment: once a month   Drug use: No   Sexual activity: Not Currently  Other Topics Concern   Not on file  Social History Narrative   Lives with wife, Harriett Sine.   Social Drivers of Corporate investment banker Strain: Low Risk  (06/28/2023)   Received from [facility]   Overall  Financial Resource Strain (CARDIA)    Difficulty of Paying Living Expenses: Not very hard  Food Insecurity: No Food Insecurity (06/28/2023)   Received from [facility]   Hunger Vital Sign    Worried About Running Out of Food in the Last Year: Never true    Ran Out of Food in the Last Year: Never true  Transportation Needs: No Transportation Needs (06/28/2023)  Received from [facility] - Transportation    In the past 12 months, has lack of transportation kept you from medical appointments or from getting medications?: No    Lack of Transportation (Non-Medical): No  Physical Activity: Unknown (04/05/2023)   Exercise Vital Sign    Days of Exercise per Week: 1 day    Minutes of Exercise per Session: Patient declined  Stress: Patient Declined (04/05/2023)   [employer] of Occupational Health - Occupational Stress Questionnaire    Feeling of Stress : Patient declined  Social Connections: Unknown (04/05/2023)   Social Connection and Isolation Panel [NHANES]    Frequency of Communication with Friends and Family: Patient declined    Frequency of Social Gatherings with Friends and Family: Patient declined    Attends Religious Services: Patient declined    Database administrator or Organizations: Patient declined    Attends Banker Meetings: Not on file    Marital Status: Patient declined     Vital Signs: There were no vitals taken for this visit.  Physical Exam Constitutional:      General: He is not in acute distress.    Appearance: He is not ill-appearing.  Pulmonary:     Effort: Pulmonary effort is normal.  Abdominal:     Tenderness: There is abdominal tenderness.     Comments: RUQ tenderness with percutaneous drains x 2  Skin:    General: Skin is warm and dry.  Neurological:     Mental Status: He is alert and oriented to person, place, and time.  Psychiatric:        Mood and Affect: Mood normal.        Behavior:  Behavior normal.        Thought Content: Thought content normal.        Judgment: Judgment normal.     Imaging: IR Catheter Tube Change Result Date: 08/05/2023 INDICATION: 73 year old male with a history of retained gallstones following cholecystectomy 5 years previously. He has had recurrent abscesses in currently has 2 percutaneous 14 French drainage catheters in place. He presents to clinic today with pain and discomfort at the more lateral drain insertion site. We will perform a drain exchange and down size to improve his comfort. He has a follow-up appointment next week surgery. EXAM: Drain exchange MEDICATIONS: None. ANESTHESIA/SEDATION: None FLUOROSCOPY TIME:  Radiation exposure index: 20.6 mGy reference air kerma COMPLICATIONS: None immediate. PROCEDURE: Informed written consent was obtained from the patient after a thorough discussion of the procedural risks, benefits and alternatives. All questions were addressed. Maximal Sterile Barrier Technique was utilized including caps, mask, sterile gowns, sterile gloves, sterile drape, hand hygiene and skin antiseptic. A timeout was performed prior to the initiation of the procedure. A gentle injection of contrast was performed through the existing drainage catheter. It appears that the distal aspect of the catheter has become clogged. The catheter was transected and removed. Unfortunately, due to clogging of the catheter a could not be removed over a wire. Once the catheter was removed, an angled 5 French glide catheter was carefully advanced down the tract. An Amplatz wire was then advanced. The glide catheter was removed. A new 12 French drainage catheter was advanced over the wire and formed. The catheter was secured in place with 0 Prolene suture and connected to JP bulb suction. IMPRESSION: 1. Existing drainage catheter is partially clogged. 2. Successful exchange and down size to a new 12 French percutaneous drainage catheter. Electronically Signed  By: Malachy Moan M.D.   On: 08/05/2023 15:47   IR Radiologist Eval & Mgmt Result Date: 08/05/2023 EXAM: ESTABLISHED PATIENT OFFICE VISIT CHIEF COMPLAINT: SEE OFFICE NOTE IN EPIC HISTORY OF PRESENT ILLNESS: SEE OFFICE NOTE IN EPIC REVIEW OF SYSTEMS: SEE OFFICE NOTE IN EPIC PHYSICAL EXAMINATION: SEE OFFICE NOTE IN EPIC ASSESSMENT AND PLAN: SEE OFFICE NOTE IN EPIC Electronically Signed   By: Malachy Moan M.D.   On: 08/05/2023 15:44    Labs:  CBC: Recent Labs    03/11/23 0444 05/17/23 1241 06/21/23 1033 07/09/23 0944  WBC 8.5 9.1 7.6 9.6  HGB 11.5* 14.4 13.9 14.6  HCT 34.4* 44.6 42.3 43.8  PLT 397 413* 415* 363    COAGS: Recent Labs    03/10/23 0902 05/17/23 1241 06/21/23 1033 07/09/23 0944  INR 1.1 1.1 1.1 1.0    BMP: Recent Labs    03/09/23 1645 03/10/23 0337 03/11/23 0444 04/26/23 1352 06/15/23 1454 07/09/23 0944  NA 134* 136 135  --   --  140  K 3.5 3.4* 3.4*  --   --  3.8  CL 102 105 102  --   --  105  CO2 23 24 23   --   --  24  GLUCOSE 141* 111* 113*  --   --  119*  BUN 13 9 16   --   --  14  CALCIUM 8.8* 8.7* 8.4*  --   --  9.6  CREATININE 0.67 0.58* 0.56* 0.80 0.60* 0.54*  GFRNONAA >60 >60 >60  --   --  >60    LIVER FUNCTION TESTS: Recent Labs    11/27/22 1103 03/09/23 1645 03/10/23 0337  BILITOT  --  0.3 0.4  AST  --  18 15  ALT  --  10 9  ALKPHOS  --  86 78  PROT  --  7.6 6.9  ALBUMIN 3.8 3.6 3.4*    Assessment:  73 year old male with a history of gallstones with recurrent abscess. He currently has two right upper quadrant percutaneous drains which were last exchanged July 09, 2023. He has re-established contact with the surgical team that performed his cholecystectomy and has pending plans for surgical gallstone retrieval.   Signed: Mickie Kay, NP 08/05/2023, 3:51 PM   Please refer to Dr. Henri Medal attestation of this note for management and plan.

## 2023-08-15 ENCOUNTER — Other Ambulatory Visit: Payer: Self-pay | Admitting: Interventional Radiology

## 2023-08-16 NOTE — Addendum Note (Signed)
 Encounter addended by: Mackovic, Glena Pharris N on: 08/16/2023 2:31 PM  Actions taken: Imaging Exam ended

## 2023-08-30 ENCOUNTER — Telehealth: Payer: Self-pay | Admitting: *Deleted

## 2023-08-30 NOTE — Transitions of Care (Post Inpatient/ED Visit) (Signed)
 08/30/2023  Name: Joel Flores MRN: [id number] DOB: [birth year]  Today's TOC FU Call Status: Today's TOC FU Call Status:: Successful TOC FU Call Completed TOC FU Call Complete Date: 08/30/23 Patient's Name and Date of Birth confirmed.  Transition Care Management Follow-up Telephone Call Date of Discharge: 08/27/23 Discharge Facility: Other [facility]) Name of Other (Non-[facility]) Discharge Facility: [facility] Type of Discharge: Inpatient Admission Primary Inpatient Discharge Diagnosis:: Surgical exploratory lap - intraabdominal abscess due to retained gallstones from prior surgery How have you been since you were released from the hospital?: Better ("I am doing okay after the surgery; I no longer go to [facility]- I go to [facility], I will call out there sometime soon to make an appointment after the hospital visit")  Partial TOC call completed- verified patient has new PCP: [facility] ([facility])   Items Reviewed: Medications obtained,verified, and reconciled?: No Medications Not Reviewed Reasons:: Other: (verified non-[facility] PCP- Partial TOC call completed accordingly)  Medications Reviewed Today: Medications Reviewed Today     Reviewed by Jorah Hua M, RN (Registered Nurse) on 08/30/23 at [phone]  Med List Status: <None>   Medication Order Taking? Sig Documenting Provider Last Dose Status Informant  acetaminophen  (TYLENOL ) 325 MG tablet [id number] No Take 325 mg by mouth daily as needed for moderate pain or headache. [provider] Taking Active Self  albuterol  (VENTOLIN  HFA) 108 (90 Base) MCG/ACT inhaler [id number] No Inhale into the lungs. Inhale 2 inhalations into the lungs 4 (four) times daily PRN [provider] Taking Active            Med Note Daneil Dunker, JANET L   Mon Jun 21, 2023 10:45 AM) Patient states, "It's been more than a month."  amLODipine  (NORVASC ) 5 MG tablet [id number] No Take 10 mg by mouth daily. [provider] Taking Active   azelastine  (ASTELIN ) 0.1 % nasal spray [id number] No Place 2 sprays into both nostrils 2 (two) times daily. Use in each nostril as directed Clarise Crooks, MD Taking Active   cetirizine  (ZYRTEC ) 10 MG tablet [id number] No TAKE 1 TABLET BY MOUTH EVERY DAY Clarise Crooks, MD Taking Active   clopidogrel  (PLAVIX ) 75 MG tablet [id number] No Take 1 tablet (75 mg total) by mouth daily. Clarise Crooks, MD Taking Active   fluticasone  (FLONASE ) 50 MCG/ACT nasal spray [id number] No SPRAY 2 SPRAYS INTO EACH NOSTRIL EVERY DAY Clarise Crooks, MD Taking Active   HYDROcodone -acetaminophen  (NORCO/VICODIN) 5-325 MG tablet [id number] No Take 1 tablet by mouth every 6 (six) hours as needed for up to 10 doses for moderate pain (pain score 4-6). Covington, Jamie R, NP Taking Active   ibuprofen (ADVIL,MOTRIN) 200 MG tablet [id number] No Take 400 mg by mouth every 8 (eight) hours as needed (for pain). [provider] Taking Active Self  levothyroxine  (SYNTHROID ) 112 MCG tablet [id number] No Take 1 tablet (112 mcg total) by mouth daily. Clarise Crooks, MD Taking Active   losartan  (COZAAR ) 100 MG tablet [id number] No Take 100 mg by mouth daily. [provider] Taking Active   metFORMIN  (GLUCOPHAGE ) 500 MG tablet [id number] No TAKE 1 TABLET BY MOUTH EVERY DAY WITH BREAKFAST Jones, Deanna C, MD Taking Active   mirtazapine (REMERON) 15 MG tablet [id number] No Take 1 tablet by mouth at bedtime. [provider] Taking Active Self  pantoprazole  (PROTONIX ) 40 MG tablet [id number] No Take 40 mg by mouth daily. [provider] Taking Active   rosuvastatin  (CRESTOR ) 40  MG tablet [id number] No Take 1 tablet (40 mg total) by mouth daily. Clarise Crooks, MD Taking Active   sertraline  (ZOLOFT ) 100 MG tablet [id number] No Take 1 tablet (100 mg total) by mouth daily. Clarise Crooks, MD Taking Active   Sodium Chloride  Flush (NORMAL SALINE FLUSH) 0.9 % SOLN [id number]  Flush each drain  with 10 ml of normal saline two times a day. Carim, Charles A, PA-C  Active   Vitamin D, Ergocalciferol, (DRISDOL) 1.25 MG (50000 UNIT) CAPS capsule [id number] No Take 50,000 Units by mouth once a week. [provider] Taking Active             Home Care and Equipment/Supplies:    Functional Questionnaire:    Follow up appointments reviewed:    Total time spent from review to signing of note/ including any care coordination interventions:  29 minutes  Pls call/ message for questions,  Niambi Smoak Mckinney Angelik Walls, RN, BSN, CCRN Alumnus RN Care Manager  Transitions of Care  [facility] [phone]: direct office

## 2023-09-14 ENCOUNTER — Encounter (INDEPENDENT_AMBULATORY_CARE_PROVIDER_SITE_OTHER): Payer: Self-pay

## 2023-09-21 ENCOUNTER — Encounter (INDEPENDENT_AMBULATORY_CARE_PROVIDER_SITE_OTHER): Payer: Self-pay | Admitting: Vascular Surgery

## 2023-09-21 ENCOUNTER — Ambulatory Visit (INDEPENDENT_AMBULATORY_CARE_PROVIDER_SITE_OTHER): Admitting: Vascular Surgery

## 2023-09-21 ENCOUNTER — Ambulatory Visit (INDEPENDENT_AMBULATORY_CARE_PROVIDER_SITE_OTHER)

## 2023-09-21 VITALS — BP 129/84 | HR 96 | Resp 18 | Ht 67.0 in | Wt 145.0 lb

## 2023-09-21 DIAGNOSIS — Z9889 Other specified postprocedural states: Secondary | ICD-10-CM

## 2023-09-21 DIAGNOSIS — I739 Peripheral vascular disease, unspecified: Secondary | ICD-10-CM

## 2023-09-21 DIAGNOSIS — I10 Essential (primary) hypertension: Secondary | ICD-10-CM | POA: Diagnosis not present

## 2023-09-21 DIAGNOSIS — E1165 Type 2 diabetes mellitus with hyperglycemia: Secondary | ICD-10-CM | POA: Diagnosis not present

## 2023-09-21 DIAGNOSIS — I6523 Occlusion and stenosis of bilateral carotid arteries: Secondary | ICD-10-CM | POA: Diagnosis not present

## 2023-09-21 DIAGNOSIS — E785 Hyperlipidemia, unspecified: Secondary | ICD-10-CM

## 2023-09-21 NOTE — Assessment & Plan Note (Signed)
 lipid control important in reducing the progression of atherosclerotic disease. Continue statin therapy

## 2023-09-21 NOTE — Assessment & Plan Note (Signed)
 blood pressure control important in reducing the progression of atherosclerotic disease. On appropriate oral medications.

## 2023-09-21 NOTE — Progress Notes (Signed)
 MRN : [id number]  Joel Flores is a 73 y.o. (09-28-1950) male who presents with chief complaint of  Chief Complaint  Patient presents with   Follow-up    67yr carotid/abi.  Aaron Aas  History of Present Illness: Patient returns today in follow up of multiple vascular issues.  He is doing well today.  He denies any focal neurologic symptoms. Specifically, the patient denies amaurosis fugax, speech or swallowing difficulties, or arm or leg weakness or numbness.  His carotid duplex shows stable 1 to 39% ICA stenosis bilaterally. He is also followed for his peripheral arterial disease.  He is about 17 years status post iliac intervention for claudication symptoms.  He has done well.  He denies any current lifestyle limiting claudication, ischemic rest pain, or ulceration on either lower extremity. ABIs today are 1.12 on the right and 1.11 on the left with multiphasic waveforms and normal digital pressures bilaterally.   Current Outpatient Medications  Medication Sig Dispense Refill   acetaminophen  (TYLENOL ) 325 MG tablet Take 325 mg by mouth daily as needed for moderate pain or headache.     albuterol  (VENTOLIN  HFA) 108 (90 Base) MCG/ACT inhaler Inhale into the lungs. Inhale 2 inhalations into the lungs 4 (four) times daily PRN     amLODipine  (NORVASC ) 5 MG tablet Take 10 mg by mouth daily.     azelastine  (ASTELIN ) 0.1 % nasal spray Place 2 sprays into both nostrils 2 (two) times daily. Use in each nostril as directed 30 mL 12   cetirizine  (ZYRTEC ) 10 MG tablet TAKE 1 TABLET BY MOUTH EVERY DAY 90 tablet 1   clopidogrel  (PLAVIX ) 75 MG tablet Take 1 tablet (75 mg total) by mouth daily. 90 tablet 1   fluticasone  (FLONASE ) 50 MCG/ACT nasal spray SPRAY 2 SPRAYS INTO EACH NOSTRIL EVERY DAY 48 mL 11   HYDROcodone -acetaminophen  (NORCO/VICODIN) 5-325 MG tablet Take 1 tablet by mouth every 6 (six) hours as needed for up to 10 doses for moderate pain (pain score 4-6). 10 tablet 0   ibuprofen (ADVIL,MOTRIN) 200 MG  tablet Take 400 mg by mouth every 8 (eight) hours as needed (for pain).     levothyroxine  (SYNTHROID ) 112 MCG tablet Take 1 tablet (112 mcg total) by mouth daily. 90 tablet 1   losartan  (COZAAR ) 100 MG tablet Take 100 mg by mouth daily.     metFORMIN  (GLUCOPHAGE ) 500 MG tablet TAKE 1 TABLET BY MOUTH EVERY DAY WITH BREAKFAST 90 tablet 1   mirtazapine (REMERON) 15 MG tablet Take 1 tablet by mouth at bedtime.     pantoprazole  (PROTONIX ) 40 MG tablet Take 40 mg by mouth daily.     rosuvastatin  (CRESTOR ) 40 MG tablet Take 1 tablet (40 mg total) by mouth daily. 90 tablet 1   sertraline  (ZOLOFT ) 100 MG tablet Take 1 tablet (100 mg total) by mouth daily. 90 tablet 1   Sodium Chloride  Flush (NORMAL SALINE FLUSH) 0.9 % SOLN Flush each drain with 10 ml of normal saline two times a day. 600 mL 3   Vitamin D, Ergocalciferol, (DRISDOL) 1.25 MG (50000 UNIT) CAPS capsule Take 50,000 Units by mouth once a week.     No current facility-administered medications for this visit.    Past Medical History:  Diagnosis Date   Allergy 1990   Anginal pain (HCC)    Anxiety    PANIC ATTACKS   Arthritis    right shoulder   Atherosclerosis of aorta (HCC) 02/12/2015   Atherosclerotic coronary vascular disease 2008  s/p right iliac stent   Cataract    COPD (chronic obstructive pulmonary disease) (HCC)    Cough    Depression    DM (diabetes mellitus) (HCC) 03/10/2023   Dyspnea    Environmental and seasonal allergies    Essential hypertension 02/12/2015   Family history of adverse reaction to anesthesia    Father - 3 - MI, then stroke after Prostate surgery   GERD (gastroesophageal reflux disease)    History of hiatal hernia    Hyperlipidemia    Hypertension    Hypothyroidism    Incisional hernia, without obstruction or gangrene    PVD (peripheral vascular disease) (HCC) 11/17/2016   Venous insufficiency    Vertigo    can happen daily   Wears hearing aid in both ears    has, does not wear    Past  Surgical History:  Procedure Laterality Date   CATARACT EXTRACTION W/PHACO Right 03/16/2018   Procedure: CATARACT EXTRACTION PHACO AND INTRAOCULAR LENS PLACEMENT (IOC);  Surgeon: Ola Berger, MD;  Location: [facility];  Service: Ophthalmology;  Laterality: Right;  US   01:05 CDE 11.85 Fluid pack lot # [id number]   CATARACT EXTRACTION W/PHACO Left 05/08/2020   Procedure: CATARACT EXTRACTION PHACO AND INTRAOCULAR LENS PLACEMENT (IOC) LEFT 7.32 00:54.7 13.4%;  Surgeon: Annell Kidney, MD;  Location: [facility];  Service: Ophthalmology;  Laterality: Left;   CHOLECYSTECTOMY     COLONOSCOPY  2015   Dr Ole Berkeley- cleared for 5 years    COLONOSCOPY WITH PROPOFOL  N/A 01/17/2018   Procedure: COLONOSCOPY WITH PROPOFOL ;  Surgeon: Marnee Sink, MD;  Location: [facility];  Service: Endoscopy;  Laterality: N/A;  requests early   HERNIA REPAIR  July 2019   INSERTION OF MESH N/A 08/31/2017   Procedure: INSERTION OF MESH;  Surgeon: Franki Isles, MD;  Location: [facility];  Service: General;  Laterality: N/A;   IR CATHETER TUBE CHANGE  08/05/2023   IR RADIOLOGIST EVAL & MGMT  06/01/2023   IR RADIOLOGIST EVAL & MGMT  06/08/2023   IR RADIOLOGIST EVAL & MGMT  07/13/2023   IR RADIOLOGIST EVAL & MGMT  08/05/2023   IR REMOVAL OF CALCULI/DEBRIS BILIARY DUCT/GB  07/09/2023   IRRIGATION AND DEBRIDEMENT ABSCESS N/A 03/10/2023   Procedure: IRRIGATION AND DEBRIDEMENT ABDOMINAL WALL ABSCESS;  Surgeon: Alben Alma, MD;  Location: [facility];  Service: General;  Laterality: N/A;   POLYPECTOMY  01/17/2018   Procedure: POLYPECTOMY;  Surgeon: Marnee Sink, MD;  Location: [facility];  Service: Endoscopy;;   UMBILICAL HERNIA REPAIR N/A 08/31/2017   Procedure: HERNIA REPAIR UMBILICAL ADULT;  Surgeon: Franki Isles, MD;  Location: [facility];  Service: General;  Laterality: N/A;   VEIN SURGERY     1 stents in R) leg     Social History   Tobacco Use   Smoking status: Every Day    Current  packs/day: 1.50    Average packs/day: 1.5 packs/day for 50.0 years (75.0 ttl pk-yrs)    Types: Cigarettes    Passive exposure: Past   Smokeless tobacco: Never  Vaping Use   Vaping status: Never Used  Substance Use Topics   Alcohol use: Yes    Comment: once a month   Drug use: No       Family History  Problem Relation Age of Onset   Diabetes Mother    Cancer Father    Heart disease Father    Stroke Father      Allergies  Allergen Reactions  Sulfa Antibiotics Rash     REVIEW OF SYSTEMS (Negative unless checked)  Constitutional: [] Weight loss  [] Fever  [] Chills Cardiac: [] Chest pain   [] Chest pressure   [] Palpitations   [] Shortness of breath when laying flat   [] Shortness of breath at rest   [] Shortness of breath with exertion. Vascular:  [] Pain in legs with walking   [] Pain in legs at rest   [] Pain in legs when laying flat   [] Claudication   [] Pain in feet when walking  [] Pain in feet at rest  [] Pain in feet when laying flat   [] History of DVT   [] Phlebitis   [] Swelling in legs   [] Varicose veins   [] Non-healing ulcers Pulmonary:   [] Uses home oxygen   [] Productive cough   [] Hemoptysis   [] Wheeze  [] COPD   [] Asthma Neurologic:  [] Dizziness  [] Blackouts   [] Seizures   [] History of stroke   [] History of TIA  [] Aphasia   [] Temporary blindness   [] Dysphagia   [] Weakness or numbness in arms   [] Weakness or numbness in legs Musculoskeletal:  [] Arthritis   [] Joint swelling   [x] Joint pain   [] Low back pain Hematologic:  [] Easy bruising  [] Easy bleeding   [] Hypercoagulable state   [] Anemic   Gastrointestinal:  [] Blood in stool   [] Vomiting blood  [] Gastroesophageal reflux/heartburn   [] Abdominal pain Genitourinary:  [] Chronic kidney disease   [] Difficult urination  [] Frequent urination  [] Burning with urination   [] Hematuria Skin:  [] Rashes   [] Ulcers   [] Wounds Psychological:  [] History of anxiety   []  History of major depression.  Physical Examination  BP 129/84   Pulse 96    Resp 18   Ht 5\' 7"  (1.702 m)   Wt 145 lb (65.8 kg)   BMI 22.71 kg/m  Gen:  WD/WN, NAD. Appears younger than stated age. Head: [place]/AT, No temporalis wasting. Ear/Nose/Throat: Hearing grossly intact, nares w/o erythema or drainage Eyes: Conjunctiva clear. Sclera non-icteric Neck: Supple.  Trachea midline Pulmonary:  Good air movement, no use of accessory muscles.  Cardiac: RRR, no JVD Vascular:  Vessel Right Left  Radial Palpable Palpable                          PT Palpable Palpable  DP Palpable Palpable   Gastrointestinal: soft, non-tender/non-distended. No guarding/reflex.  Musculoskeletal: M/S 5/5 throughout.  No deformity or atrophy. No edema. Neurologic: Sensation grossly intact in extremities.  Symmetrical.  Speech is fluent.  Psychiatric: Judgment intact, Mood & affect appropriate for pt's clinical situation. Dermatologic: No rashes or ulcers noted.  No cellulitis or open wounds.      Labs Recent Results (from the past 2160 hours)  CBC     Status: None   Collection Time: 07/09/23  9:44 AM  Result Value Ref Range   WBC 9.6 4.0 - 10.5 K/uL   RBC 5.12 4.22 - 5.81 MIL/uL   Hemoglobin 14.6 13.0 - 17.0 g/dL   HCT 40.9 81.1 - 91.4 %   MCV 85.5 80.0 - 100.0 fL   MCH 28.5 26.0 - 34.0 pg   MCHC 33.3 30.0 - 36.0 g/dL   RDW 78.2 95.6 - 21.3 %   Platelets 363 150 - 400 K/uL   nRBC 0.0 0.0 - 0.2 %    Comment: Performed at [facility], [address] [place], [place] [address]  Basic metabolic panel     Status: Abnormal   Collection Time: 07/09/23  9:44 AM  Result Value Ref  Range   Sodium 140 135 - 145 mmol/L   Potassium 3.8 3.5 - 5.1 mmol/L   Chloride 105 98 - 111 mmol/L   CO2 24 22 - 32 mmol/L   Glucose, Bld 119 (H) 70 - 99 mg/dL    Comment: Glucose reference range applies only to samples taken after fasting for at least 8 hours.   BUN 14 8 - 23 mg/dL   Creatinine, Ser 1.61 (L) 0.61 - 1.24 mg/dL   Calcium  9.6 8.9 - 10.3 mg/dL   GFR, Estimated >09 >60  mL/min    Comment: (NOTE) Calculated using the CKD-EPI Creatinine Equation (2021)    Anion gap 11 5 - 15    Comment: Performed at [facility], [address] [place], [place] [address]  Protime-INR     Status: None   Collection Time: 07/09/23  9:44 AM  Result Value Ref Range   Prothrombin Time 13.1 11.4 - 15.2 seconds   INR 1.0 0.8 - 1.2    Comment: (NOTE) INR goal varies based on device and disease states. Performed at [facility], [address] [place], [place] [address]   Glucose, capillary     Status: Abnormal   Collection Time: 07/09/23  9:49 AM  Result Value Ref Range   Glucose-Capillary 123 (H) 70 - 99 mg/dL    Comment: Glucose reference range applies only to samples taken after fasting for at least 8 hours.    Radiology No results found.  Assessment/Plan  Essential hypertension blood pressure control important in reducing the progression of atherosclerotic disease. On appropriate oral medications.   DM (diabetes mellitus) (HCC) blood glucose control important in reducing the progression of atherosclerotic disease. Also, involved in wound healing. On appropriate medications.   Hyperlipemia lipid control important in reducing the progression of atherosclerotic disease. Continue statin therapy   Bilateral carotid artery stenosis His carotid duplex shows stable 1 to 39% ICA stenosis bilaterally.  He is on antiplatelet therapy and a statin agent.  No role for intervention with mild disease.  Recheck in 1 year.  PVD (peripheral vascular disease) (HCC) ABIs today are 1.12 on the right and 1.11 on the left with multiphasic waveforms and normal digital pressures bilaterally.  Approximately 17 years status post iliac intervention.  No current symptoms.  On antiplatelet therapy and a statin agent.  We will continue to follow this annually.    Mikki Alexander, MD  09/21/2023 9:12 AM    This note was created with Dragon medical transcription system.   Any errors from dictation are purely unintentional

## 2023-09-21 NOTE — Assessment & Plan Note (Signed)
 ABIs today are 1.12 on the right and 1.11 on the left with multiphasic waveforms and normal digital pressures bilaterally.  Approximately 17 years status post iliac intervention.  No current symptoms.  On antiplatelet therapy and a statin agent.  We will continue to follow this annually.

## 2023-09-21 NOTE — Assessment & Plan Note (Signed)
 blood glucose control important in reducing the progression of atherosclerotic disease. Also, involved in wound healing. On appropriate medications.

## 2023-09-21 NOTE — Assessment & Plan Note (Signed)
 His carotid duplex shows stable 1 to 39% ICA stenosis bilaterally.  He is on antiplatelet therapy and a statin agent.  No role for intervention with mild disease.  Recheck in 1 year.

## 2023-09-23 ENCOUNTER — Ambulatory Visit

## 2023-09-23 LAB — VAS US ABI WITH/WO TBI
Left ABI: 1.11
Right ABI: 1.12

## 2023-10-26 ENCOUNTER — Other Ambulatory Visit: Payer: Self-pay | Admitting: Acute Care

## 2023-10-26 DIAGNOSIS — Z87891 Personal history of nicotine dependence: Secondary | ICD-10-CM

## 2023-10-26 DIAGNOSIS — Z122 Encounter for screening for malignant neoplasm of respiratory organs: Secondary | ICD-10-CM

## 2023-10-26 DIAGNOSIS — F17210 Nicotine dependence, cigarettes, uncomplicated: Secondary | ICD-10-CM

## 2023-11-12 ENCOUNTER — Ambulatory Visit
Admission: RE | Admit: 2023-11-12 | Discharge: 2023-11-12 | Disposition: A | Discharge status: Home / Self Care | Source: Ambulatory Visit | Attending: Acute Care | Admitting: Acute Care

## 2023-11-12 DIAGNOSIS — Z87891 Personal history of nicotine dependence: Secondary | ICD-10-CM | POA: Diagnosis present

## 2023-11-12 DIAGNOSIS — Z122 Encounter for screening for malignant neoplasm of respiratory organs: Secondary | ICD-10-CM | POA: Diagnosis present

## 2023-11-12 DIAGNOSIS — F17210 Nicotine dependence, cigarettes, uncomplicated: Secondary | ICD-10-CM | POA: Insufficient documentation

## 2023-11-22 ENCOUNTER — Other Ambulatory Visit: Payer: Self-pay | Admitting: Acute Care

## 2023-11-22 DIAGNOSIS — Z87891 Personal history of nicotine dependence: Secondary | ICD-10-CM

## 2023-11-22 DIAGNOSIS — Z122 Encounter for screening for malignant neoplasm of respiratory organs: Secondary | ICD-10-CM

## 2023-11-22 DIAGNOSIS — F17210 Nicotine dependence, cigarettes, uncomplicated: Secondary | ICD-10-CM

## 2023-12-29 ENCOUNTER — Encounter: Payer: Self-pay | Admitting: Emergency Medicine

## 2023-12-29 ENCOUNTER — Ambulatory Visit
Admission: EM | Admit: 2023-12-29 | Discharge: 2023-12-29 | Disposition: A | Discharge status: Home / Self Care | Attending: Family Medicine | Admitting: Family Medicine

## 2023-12-29 DIAGNOSIS — W57XXXA Bitten or stung by nonvenomous insect and other nonvenomous arthropods, initial encounter: Secondary | ICD-10-CM | POA: Diagnosis not present

## 2023-12-29 DIAGNOSIS — T7840XA Allergy, unspecified, initial encounter: Secondary | ICD-10-CM

## 2023-12-29 MED ORDER — PREDNISONE 20 MG PO TABS
40.0000 mg | ORAL_TABLET | Freq: Once | ORAL | Status: AC
  Administered 2023-12-29: 40 mg via ORAL

## 2023-12-29 MED ORDER — TRIAMCINOLONE ACETONIDE 0.1 % EX OINT: 1.0000 | TOPICAL_OINTMENT | Freq: Two times a day (BID) | CUTANEOUS | 0 refills | Status: AC

## 2023-12-29 NOTE — ED Provider Notes (Signed)
 [facility]    CSN: [id number] Arrival date & time: 12/29/23  0848      History   Chief Complaint Chief Complaint  Patient presents with   Insect Bite    HPI Joel Flores is a 73 y.o. male.   HPI  Joel Flores presents for concern for insect bite that occurred this morning after grabbing his trashcan from the curb.  Something stung him on his left little finger. Its red and swollen. HE has no trouble moving it. He scrapped the little finger with the knife and peroxide with soap and water .  He took some Benadryl.  No eye irritation, sore throat, headache, chest pain, throat tightness, difficulty breathing, belly pain, vomiting, diarrhea and fever.  He endorses some nausea.    Past Medical History:  Diagnosis Date   Allergy 1990   Anginal pain (HCC)    Anxiety    PANIC ATTACKS   Arthritis    right shoulder   Atherosclerosis of aorta (HCC) 02/12/2015   Atherosclerotic coronary vascular disease 2008   s/p right iliac stent   Cataract    COPD (chronic obstructive pulmonary disease) (HCC)    Cough    Depression    DM (diabetes mellitus) (HCC) 03/10/2023   Dyspnea    Environmental and seasonal allergies    Essential hypertension 02/12/2015   Family history of adverse reaction to anesthesia    Father - 2 - MI, then stroke after Prostate surgery   GERD (gastroesophageal reflux disease)    History of hiatal hernia    Hyperlipidemia    Hypertension    Hypothyroidism    Incisional hernia, without obstruction or gangrene    PVD (peripheral vascular disease) (HCC) 11/17/2016   Venous insufficiency    Vertigo    can happen daily   Wears hearing aid in both ears    has, does not wear    Patient Active Problem List   Diagnosis Date Noted   Hepatic abscess 03/10/2023   Abdominal pain 03/10/2023   DM (diabetes mellitus) (HCC) 03/10/2023   Sepsis (HCC) 03/10/2023   Abdominal wall abscess 03/10/2023   Bilateral carotid artery stenosis 09/20/2019   Colon cancer  screening    Polyp of sigmoid colon    SOBOE (shortness of breath on exertion) 07/30/2017   PVD (peripheral vascular disease) (HCC) 11/17/2016   Tobacco use disorder 11/17/2016   Reactive airway disease, mild intermittent, uncomplicated 08/12/2016   Allergic rhinitis due to pollen 08/13/2015   Nocturia 08/13/2015   Atherosclerosis of aorta (HCC) 02/12/2015   Essential hypertension 02/12/2015   Esophageal reflux 02/12/2015   Depression 02/12/2015   Adult hypothyroidism 02/12/2015   Chronic anxiety 02/12/2015   Hyperlipemia 02/12/2015    Past Surgical History:  Procedure Laterality Date   CATARACT EXTRACTION W/PHACO Right 03/16/2018   Procedure: CATARACT EXTRACTION PHACO AND INTRAOCULAR LENS PLACEMENT (IOC);  Surgeon: Ferol Rogue, MD;  Location: [facility];  Service: Ophthalmology;  Laterality: Right;  US   01:05 CDE 11.85 Fluid pack lot # [id number]   CATARACT EXTRACTION W/PHACO Left 05/08/2020   Procedure: CATARACT EXTRACTION PHACO AND INTRAOCULAR LENS PLACEMENT (IOC) LEFT 7.32 00:54.7 13.4%;  Surgeon: Mittie Gaskin, MD;  Location: [facility];  Service: Ophthalmology;  Laterality: Left;   CHOLECYSTECTOMY     COLONOSCOPY  2015   Dr Jinny- cleared for 5 years    COLONOSCOPY WITH PROPOFOL  N/A 01/17/2018   Procedure: COLONOSCOPY WITH PROPOFOL ;  Surgeon: Jinny Carmine, MD;  Location: [facility];  Service:  Endoscopy;  Laterality: N/A;  requests early   HERNIA REPAIR  July 2019   INSERTION OF MESH N/A 08/31/2017   Procedure: INSERTION OF MESH;  Surgeon: Nicholaus Selinda Birmingham, MD;  Location: [facility];  Service: General;  Laterality: N/A;   IR CATHETER TUBE CHANGE  08/05/2023   IR RADIOLOGIST EVAL & MGMT  06/01/2023   IR RADIOLOGIST EVAL & MGMT  06/08/2023   IR RADIOLOGIST EVAL & MGMT  07/13/2023   IR RADIOLOGIST EVAL & MGMT  08/05/2023   IR REMOVAL OF CALCULI/DEBRIS BILIARY DUCT/GB  07/09/2023   IRRIGATION AND DEBRIDEMENT ABSCESS N/A 03/10/2023   Procedure: IRRIGATION AND  DEBRIDEMENT ABDOMINAL WALL ABSCESS;  Surgeon: Jordis Laneta FALCON, MD;  Location: [facility];  Service: General;  Laterality: N/A;   POLYPECTOMY  01/17/2018   Procedure: POLYPECTOMY;  Surgeon: Jinny Carmine, MD;  Location: [facility];  Service: Endoscopy;;   UMBILICAL HERNIA REPAIR N/A 08/31/2017   Procedure: HERNIA REPAIR UMBILICAL ADULT;  Surgeon: Nicholaus Selinda Birmingham, MD;  Location: [facility];  Service: General;  Laterality: N/A;   VEIN SURGERY     1 stents in R) leg       Home Medications    Prior to Admission medications   Medication Sig Start Date End Date Taking? Authorizing Provider  triamcinolone  ointment (KENALOG ) 0.1 % Apply 1 Application topically 2 (two) times daily. 12/29/23  Yes Lynetta Tomczak, DO  acetaminophen  (TYLENOL ) 325 MG tablet Take 325 mg by mouth daily as needed for moderate pain or headache.    [provider]  albuterol  (VENTOLIN  HFA) 108 (90 Base) MCG/ACT inhaler Inhale into the lungs. Inhale 2 inhalations into the lungs 4 (four) times daily PRN 05/26/21   [provider]  amLODipine  (NORVASC ) 5 MG tablet Take 10 mg by mouth daily.    [provider]  azelastine  (ASTELIN ) 0.1 % nasal spray Place 2 sprays into both nostrils 2 (two) times daily. Use in each nostril as directed 11/03/21   Joshua Cathryne BROCKS, MD  cetirizine  (ZYRTEC ) 10 MG tablet TAKE 1 TABLET BY MOUTH EVERY DAY 06/03/23   Joshua Cathryne BROCKS, MD  clopidogrel  (PLAVIX ) 75 MG tablet Take 1 tablet (75 mg total) by mouth daily. 04/05/23   Joshua Cathryne BROCKS, MD  fluticasone  (FLONASE ) 50 MCG/ACT nasal spray SPRAY 2 SPRAYS INTO EACH NOSTRIL EVERY DAY 04/05/23   Jones, Deanna C, MD  HYDROcodone -acetaminophen  (NORCO/VICODIN) 5-325 MG tablet Take 1 tablet by mouth every 6 (six) hours as needed for up to 10 doses for moderate pain (pain score 4-6). 07/09/23   Covington, Jamie R, NP  ibuprofen (ADVIL,MOTRIN) 200 MG tablet Take 400 mg by mouth every 8 (eight) hours as needed (for pain).    [provider]  levothyroxine  (SYNTHROID ) 112 MCG tablet Take 1 tablet (112 mcg total) by mouth daily. 04/05/23   Joshua Cathryne BROCKS, MD  losartan  (COZAAR ) 100 MG tablet Take 100 mg by mouth daily.    [provider]  metFORMIN  (GLUCOPHAGE ) 500 MG tablet TAKE 1 TABLET BY MOUTH EVERY DAY WITH BREAKFAST 04/05/23   Jones, Deanna C, MD  mirtazapine (REMERON) 15 MG tablet Take 1 tablet by mouth at bedtime. 06/28/23 06/27/24  [provider]  pantoprazole  (PROTONIX ) 40 MG tablet Take 40 mg by mouth daily. 07/06/23   [provider]  rosuvastatin  (CRESTOR ) 40 MG tablet Take 1 tablet (40 mg total) by mouth daily. 04/05/23   Joshua Cathryne BROCKS, MD  sertraline  (ZOLOFT ) 100 MG tablet Take 1 tablet (  100 mg total) by mouth daily. 04/05/23   Joshua Cathryne BROCKS, MD  Sodium Chloride  Flush (NORMAL SALINE FLUSH) 0.9 % SOLN Flush each drain with 10 ml of normal saline two times a day. 07/26/23   Carim, Charles A, PA-C  Vitamin D, Ergocalciferol, (DRISDOL) 1.25 MG (50000 UNIT) CAPS capsule Take 50,000 Units by mouth once a week. 06/30/23   [provider]    Family History Family History  Problem Relation Age of Onset   Diabetes Mother    Cancer Father    Heart disease Father    Stroke Father     Social History Social History   Tobacco Use   Smoking status: Every Day    Current packs/day: 1.50    Average packs/day: 1.5 packs/day for 50.0 years (75.0 ttl pk-yrs)    Types: Cigarettes    Passive exposure: Past   Smokeless tobacco: Never  Vaping Use   Vaping status: Never Used  Substance Use Topics   Alcohol use: Yes    Comment: once a month   Drug use: No     Allergies   Sulfa antibiotics   Review of Systems Review of Systems :negative unless otherwise stated in HPI.      Physical Exam Triage Vital Signs ED Triage Vitals  Encounter Vitals Group     BP 12/29/23 0948 126/72     Girls Systolic BP Percentile --      Girls Diastolic BP Percentile --      Boys Systolic BP  Percentile --      Boys Diastolic BP Percentile --      Pulse Rate 12/29/23 0947 70     Resp 12/29/23 0947 16     Temp 12/29/23 0948 97.8 F (36.6 C)     Temp Source 12/29/23 0948 Oral     SpO2 12/29/23 0947 99 %     Weight 12/29/23 0947 156 lb (70.8 kg)     Height --      Head Circumference --      Peak Flow --      Pain Score 12/29/23 0947 0     Pain Loc --      Pain Education --      Exclude from Growth Chart --    No data found.  Updated Vital Signs BP 126/72 (BP Location: Left Arm)   Pulse 70   Temp 97.8 F (36.6 C) (Oral)   Resp 16   Wt 70.8 kg   SpO2 99%   BMI 24.43 kg/m   Visual Acuity Right Eye Distance:   Left Eye Distance:   Bilateral Distance:    Right Eye Near:   Left Eye Near:    Bilateral Near:     Physical Exam  GEN: alert, well appearing male, in no acute distress  EYES: no scleral injection or discharge HENT: moist mucus membranes, no uvula edema or facial edema  CV: regular rate and rhythm, strong radial pulse, no distal cyanosis   RESP: no increased work of breathing, clear to ascultation bilaterally MSK: Left Hand: Inspection: No obvious deformity b/l. Mild swelling and erythema but bruising to left little finger Palpation: no TTP of little finger  ROM: Full ROM of the digits and wrist b/l. No swelling in PIP, DIP joints b/l. Flexor digitorum profundus and superficialis tendon functions are intact.  PIP joint collateral ligaments are stable  Strength: 5/5 strength in the interosseus muscles  Neurovascular: NV intact b/l NEURO: alert, moves all extremities appropriately SKIN: warm  and dry; no insect stingers or body parts visible   UC Treatments / Results  Labs (all labs ordered are listed, but only abnormal results are displayed) Labs Reviewed - No data to display  EKG   Radiology No results found.  Procedures Procedures (including critical care time)  Medications Ordered in UC Medications  predniSONE  (DELTASONE ) tablet 40 mg  (40 mg Oral Given 12/29/23 1017)    Initial Impression / Assessment and Plan / UC Course  I have reviewed the triage vital signs and the nursing notes.  Pertinent labs & imaging results that were available during my care of the patient were reviewed by me and considered in my medical decision making (see chart for details).     Patient is a 73 y.o. malewho presents for sting about 3 hours ago to his left little finger.  Overall, patient is well-appearing and well-hydrated.  Vital signs stable.  Marciano is afebrile.  Exam concerning for localized erythema and edema.  Treat with steroid ointment. Pt given dose of prednisone  here. Declined Decadron  injection. No sign of infection to suggest antibiotics or antifungals at this time.   Reviewed expectations regarding course of current medical issues.  All questions asked were answered.  Outlined signs and symptoms indicating need for more acute intervention. Patient verbalized understanding. After Visit Summary given.   Final Clinical Impressions(s) / UC Diagnoses   Final diagnoses:  Bitten or stung by nonvenomous insect and other nonvenomous arthropods, initial encounter  Allergic reaction, initial encounter     Discharge Instructions      Stop by the pharmacy to pick up your prescriptions.  Follow up with your primary care provider or return to the urgent care, if not improving.   See handout on insect stings.      ED Prescriptions     Medication Sig Dispense Auth. Provider   triamcinolone  ointment (KENALOG ) 0.1 % Apply 1 Application topically 2 (two) times daily. 30 g Kani Chauvin, DO      PDMP not reviewed this encounter.              Rolly Magri, DO 12/29/23 1151

## 2023-12-29 NOTE — Discharge Instructions (Signed)
 Stop by the pharmacy to pick up your prescriptions.  Follow up with your primary care provider or return to the urgent care, if not improving.   See handout on insect stings.

## 2023-12-29 NOTE — ED Triage Notes (Signed)
 Pt states he was bit or stung by an insect this morning on his left pinky. An hour later he started to feel dizzy and fatigued. He took a benadryl shortly after.

## 2023-12-30 ENCOUNTER — Ambulatory Visit: Payer: Self-pay

## 2024-03-15 ENCOUNTER — Encounter: Payer: Self-pay | Admitting: Physical Therapy

## 2024-03-15 ENCOUNTER — Ambulatory Visit: Attending: Orthopedic Surgery | Admitting: Physical Therapy

## 2024-03-15 DIAGNOSIS — M25611 Stiffness of right shoulder, not elsewhere classified: Secondary | ICD-10-CM | POA: Diagnosis present

## 2024-03-15 DIAGNOSIS — M6281 Muscle weakness (generalized): Secondary | ICD-10-CM | POA: Insufficient documentation

## 2024-03-15 DIAGNOSIS — Z96611 Presence of right artificial shoulder joint: Secondary | ICD-10-CM | POA: Insufficient documentation

## 2024-03-15 NOTE — Therapy (Signed)
 OUTPATIENT PHYSICAL THERAPY SHOULDER POST-OP EVALUATION  Patient Name: Joel Flores MRN: [id number] DOB:Mar 26, 1951, 73 y.o., male Today's Date: 03/15/2024  END OF SESSION:  PT End of Session - 03/20/24 1122     Visit Number 1    Number of Visits 21    Date for Recertification  05/24/24    Authorization Type [employer]    PT Start Time 0901    PT Stop Time 0953    PT Time Calculation (min) 52 min    Equipment Utilized During Treatment --   post-op R shoulder sling   Activity Tolerance Patient tolerated treatment well    Behavior During Therapy [facility] for tasks assessed/performed          Past Medical History:  Diagnosis Date   Allergy 1990   Anginal pain    Anxiety    PANIC ATTACKS   Arthritis    right shoulder   Atherosclerosis of aorta 02/12/2015   Atherosclerotic coronary vascular disease 2008   s/p right iliac stent   Cataract    COPD (chronic obstructive pulmonary disease) (HCC)    Cough    Depression    DM (diabetes mellitus) (HCC) 03/10/2023   Dyspnea    Environmental and seasonal allergies    Essential hypertension 02/12/2015   Family history of adverse reaction to anesthesia    Father - 43 - MI, then stroke after Prostate surgery   GERD (gastroesophageal reflux disease)    History of hiatal hernia    Hyperlipidemia    Hypertension    Hypothyroidism    Incisional hernia, without obstruction or gangrene    PVD (peripheral vascular disease) 11/17/2016   Venous insufficiency    Vertigo    can happen daily   Wears hearing aid in both ears    has, does not wear   Past Surgical History:  Procedure Laterality Date   CATARACT EXTRACTION W/PHACO Right 03/16/2018   Procedure: CATARACT EXTRACTION PHACO AND INTRAOCULAR LENS PLACEMENT (IOC);  Surgeon: Ferol Rogue, MD;  Location: [facility];  Service: Ophthalmology;  Laterality: Right;  US   01:05 CDE 11.85 Fluid pack lot # [id number]   CATARACT EXTRACTION W/PHACO Left 05/08/2020   Procedure: CATARACT  EXTRACTION PHACO AND INTRAOCULAR LENS PLACEMENT (IOC) LEFT 7.32 00:54.7 13.4%;  Surgeon: Mittie Gaskin, MD;  Location: [facility];  Service: Ophthalmology;  Laterality: Left;   CHOLECYSTECTOMY     COLONOSCOPY  2015   Dr Jinny- cleared for 5 years    COLONOSCOPY WITH PROPOFOL  N/A 01/17/2018   Procedure: COLONOSCOPY WITH PROPOFOL ;  Surgeon: Jinny Carmine, MD;  Location: [facility];  Service: Endoscopy;  Laterality: N/A;  requests early   HERNIA REPAIR  July 2019   INSERTION OF MESH N/A 08/31/2017   Procedure: INSERTION OF MESH;  Surgeon: Nicholaus Selinda Birmingham, MD;  Location: [facility];  Service: General;  Laterality: N/A;   IR CATHETER TUBE CHANGE  08/05/2023   IR RADIOLOGIST EVAL & MGMT  06/01/2023   IR RADIOLOGIST EVAL & MGMT  06/08/2023   IR RADIOLOGIST EVAL & MGMT  07/13/2023   IR RADIOLOGIST EVAL & MGMT  08/05/2023   IR REMOVAL OF CALCULI/DEBRIS BILIARY DUCT/GB  07/09/2023   IRRIGATION AND DEBRIDEMENT ABSCESS N/A 03/10/2023   Procedure: IRRIGATION AND DEBRIDEMENT ABDOMINAL WALL ABSCESS;  Surgeon: Jordis Laneta FALCON, MD;  Location: [facility];  Service: General;  Laterality: N/A;   POLYPECTOMY  01/17/2018   Procedure: POLYPECTOMY;  Surgeon: Jinny Carmine, MD;  Location: [facility];  Service:  Endoscopy;;   UMBILICAL HERNIA REPAIR N/A 08/31/2017   Procedure: HERNIA REPAIR UMBILICAL ADULT;  Surgeon: Nicholaus Selinda Birmingham, MD;  Location: [facility];  Service: General;  Laterality: N/A;   VEIN SURGERY     1 stents in R) leg   Patient Active Problem List   Diagnosis Date Noted   Hepatic abscess 03/10/2023   Abdominal pain 03/10/2023   DM (diabetes mellitus) (HCC) 03/10/2023   Sepsis (HCC) 03/10/2023   Abdominal wall abscess 03/10/2023   Bilateral carotid artery stenosis 09/20/2019   Colon cancer screening    Polyp of sigmoid colon    SOBOE (shortness of breath on exertion) 07/30/2017   PVD (peripheral vascular disease) 11/17/2016   Tobacco use disorder 11/17/2016   Reactive  airway disease, mild intermittent, uncomplicated 08/12/2016   Allergic rhinitis due to pollen 08/13/2015   Nocturia 08/13/2015   Atherosclerosis of aorta 02/12/2015   Essential hypertension 02/12/2015   Esophageal reflux 02/12/2015   Depression 02/12/2015   Adult hypothyroidism 02/12/2015   Chronic anxiety 02/12/2015   Hyperlipemia 02/12/2015    PCP: Doretta Button, MD  REFERRING PROVIDER: Alease Oyster, MD  REFERRING DIAG: s/p R reverse total shoulder arthroplasty  RATIONALE FOR EVALUATION AND TREATMENT: Rehabilitation  THERAPY DIAG: S/P reverse total shoulder arthroplasty, right  Stiffness of right shoulder, not elsewhere classified  Muscle weakness (generalized)  ONSET DATE: 03/08/24, R rTSA  FOLLOW-UP APPT SCHEDULED WITH REFERRING PROVIDER: Yes ; 03/28/24   SUBJECTIVE:                                                                                                                                                                                         SUBJECTIVE STATEMENT:  Pt is a 73 year old male s/p R rTSA (DOS: 03/08/24).  PERTINENT HISTORY: Pt is a 73 year old male s/p R rTSA (DOS: 03/08/24).  Pt currently reports some confusion regarding his HEP. Pt has completed given exercises from [facility], but did not understand relevance to his shoulder. Pt denies post-op complications. Patient reports some challenge with comfort at night. Pt is smoker and spends a lot of time in his shop to smoke and work on [employer] working.   PAIN:  Pain Intensity: Present: 2/10, Best: 2/10, Worst: 10/10 Pain location: Anterior arm pain Pain Quality: burning  Radiating: No  Numbness/Tingling: Yes; fleeting N/T in 4th-5th digits and thumb; this has subsided Focal Weakness: No Aggravating factors: inadvertent abduction/ER of R arm Relieving factors: None History of prior shoulder or neck/shoulder injury, pain, surgery, or therapy: Yes; chronic R shoulder pain/RTC arthropathy  Falls: Has  patient fallen in last 6 months? No, Number of falls: N/A Dominant hand: right Imaging:  Yes ;  - X-ray: good post-op follow-up  Red flags (personal history of cancer, chills/fever, night sweats, nausea, vomiting, unrelenting pain, unexplained weight gain/loss): Negative  PRECAUTIONS: Other: s/p R rTSA  WEIGHT BEARING RESTRICTIONS: No  FALLS: Has patient fallen in last 6 months? 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  Recent memory is intact.  Remote memory is intact.  Attention span and concentration are intact.  Expressive speech is intact.  Patient's fund of knowledge is within normal limits for educational level.    Gross Musculoskeletal Assessment Tremor: None Bulk: Normal Tone: Normal No signs of infection  Posture Rounded shoulders, mild forward head  AROM AROM (Normal range in degrees) AROM 03/15/24   Right Left  Shoulder    Flexion 47 142  Extension    Abduction 48 168  External Rotation 15 66  Internal Rotation    Hands Behind Head    Hands Behind Back        Elbow    Flexion    Extension    Pronation    Supination    (* = pain; Blank rows = not tested)  UE MMT: MMT deferred on initial evaluation due to post-op status* MMT (out of 5) Right Left       Shoulder   Flexion    Extension    Abduction    External rotation    Internal rotation    Horizontal abduction    Horizontal adduction    Lower Trapezius    Rhomboids        Elbow  Flexion    Extension    Pronation    Supination        Wrist  Flexion    Extension    Radial deviation    Ulnar deviation        MCP  Flexion     Extension    Abduction    Adduction    (* = pain; Blank rows = not tested)  Sensation Deferred  Reflexes Deferred  Palpation TTP R ACJ (1), R long head of biceps tendon (1), R lateral deltoid (1), R anterior deltoid (1) (Blank rows = not tested) Graded on 0-4 scale (0 = no pain, 1 = pain, 2 = pain with wincing/grimacing/flinching, 3 = pain with withdrawal, 4 = unwilling to allow palpation), (Blank rows = not tested)     TODAY'S TREATMENT   03/15/24   Therapeutic Exercise - for HEP establishment, discussion on appropriate exercise/activity modification, PT education   Reviewed baseline home exercises and provided handout for [employer] program (see Access Code); tactile cueing and therapist demonstration utilized as needed for carryover of proper technique to HEP.  Discussed role of elbow and wrist AROM for maintaining mobility distal to shoulder during period of immobilization.   Patient education on current condition, anatomy involved, prognosis, plan of care. Discussion on current activity restrictions, including lifting > weight of coffee cup and reaching behind back. Reviewed at length current ROM restrictions with forward flexion and ER.     PATIENT EDUCATION:  Education details: see above for patient education details Person educated: Patient Education method: Explanation, Demonstration, and Handouts Education comprehension: verbalized  understanding and returned demonstration   HOME EXERCISE PROGRAM:  Access Code: [email or web address] URL: [email or web address] Date: 03/15/2024 Prepared by: Venetia Endo  Exercises - Supine Shoulder Flexion AAROM with Dowel  - 2 x daily - 7 x weekly - 2 sets - 10 reps - Supine Shoulder External Rotation with Dowel  - 2 x daily - 7 x weekly - 2 sets - 10 reps - Seated Scapular Retraction  - 2 x daily - 7 x weekly - 2 sets - 10 reps - 3sec hold   ASSESSMENT:  CLINICAL IMPRESSION: Patient is a 73 y.o. male who was seen  today for physical therapy evaluation and treatment s/p R rTSA (DOS: 03/08/24) with routine healing. Patient has expected post-operative impairments in post-op intermittent pain, edema, dec AROM, dec strength. Pt has associated deficits in R upper limb functional use and ability to complete projects in his shop. Pt will continue to benefit from skilled PT services to address deficits and improve function.   OBJECTIVE IMPAIRMENTS: decreased ROM, decreased strength, hypomobility, impaired flexibility, impaired UE functional use, postural dysfunction, and pain.   ACTIVITY LIMITATIONS: carrying, lifting, transfers, bathing, dressing, self feeding, reach over head, and hygiene/grooming  PARTICIPATION LIMITATIONS: meal prep, cleaning, laundry, driving, yard work, and working in his shop  PERSONAL FACTORS: Age, Past/current experiences, and 3+ comorbidities: (chronic tobacco use, PVD, atherosclerosis of aorta, DM, depression/anxietty, HLD) are also affecting patient's functional outcome.   REHAB POTENTIAL: Good  CLINICAL DECISION MAKING: Stable/uncomplicated  EVALUATION COMPLEXITY: Low   GOALS: Goals reviewed with patient? Yes  SHORT TERM GOALS: Target date: 04/10/2024  Pt will be independent with HEP to improve strength and decrease shoulder pain to improve pain-free function at home and work. Baseline: 03/15/24: Pt continuing exercises from [facility] HEP printout (elbow AROM, wrist AROM, gripping); we updated HEP for shoulder AAROM in limited ROM and scap retractions.  Goal status: INITIAL   LONG TERM GOALS: Target date: 06/01/2024  Pt will have R shoulder active forward elevation to 130 deg as needed for functional ROM for reaching, self-care ADLs, and household tasks Baseline: 03/15/24: R shoulder flexion 47 deg Goal status: INITIAL  2.  Pt will decrease worst shoulder pain by at least 3 points on the NPRS in order to demonstrate clinically significant reduction in shoulder pain. Baseline:  03/15/24: 10/10 at worst.  Goal status: INITIAL  3.  Pt will decrease quick DASH score by at least 8% in order to demonstrate clinically significant reduction in disability related to shoulder pain        Baseline: 03/15/24: 81.8% Goal status: INITIAL  4. Pt will have MMT 4+/5 or greater for all tested shoulder muscles (deltoid, RTC, periscapular) in order to demonstrate improvement in strength and function         Baseline: 03/15/24: MMT deferred due to early post-op status.  Goal status: INITIAL   PLAN: PT FREQUENCY: 1-2x/week  PT DURATION: 12 weeks  PLANNED INTERVENTIONS: Therapeutic exercises, Therapeutic activity, Neuromuscular re-education, Balance training, Gait training, Patient/Family education, Self Care, Joint mobilization, Joint manipulation, Vestibular training, Canalith repositioning, Orthotic/Fit training, DME instructions, Dry Needling, Electrical stimulation, Spinal manipulation, Spinal mobilization, Cryotherapy, Moist heat, Taping, Traction, Ultrasound, Ionotophoresis 4mg /ml Dexamethasone , Manual therapy, and Re-evaluation.  PLAN FOR NEXT SESSION: R shoulder PROM within limits of protocol, AA/AROM as tolerated for forward elevation and limited ER ROM, periscapular isometrics   Venetia Endo, PT, DPT #[id number]  Venetia ONEIDA Endo, PT 03/20/2024, 11:22 AM

## 2024-03-20 ENCOUNTER — Encounter: Payer: Self-pay | Admitting: Physical Therapy

## 2024-03-21 ENCOUNTER — Ambulatory Visit

## 2024-03-21 DIAGNOSIS — Z96611 Presence of right artificial shoulder joint: Secondary | ICD-10-CM | POA: Diagnosis not present

## 2024-03-21 DIAGNOSIS — M6281 Muscle weakness (generalized): Secondary | ICD-10-CM

## 2024-03-21 DIAGNOSIS — M25611 Stiffness of right shoulder, not elsewhere classified: Secondary | ICD-10-CM

## 2024-03-21 NOTE — Therapy (Signed)
 OUTPATIENT PHYSICAL THERAPY SHOULDER POST-OP EVALUATION  Patient Name: Joel Flores MRN: [id number] DOB:Apr 04, 1951, 73 y.o., male Today's Date: 03/15/2024  END OF SESSION:  PT End of Session - 03/21/24 0929     Visit Number 2    Number of Visits 21    Date for Recertification  05/24/24    Authorization Type [employer]    PT Start Time 0930    PT Stop Time 1015    PT Time Calculation (min) 45 min    Equipment Utilized During Treatment --   post-op R shoulder sling   Activity Tolerance Patient tolerated treatment well    Behavior During Therapy WFL for tasks assessed/performed           Past Medical History:  Diagnosis Date   Allergy 1990   Anginal pain    Anxiety    PANIC ATTACKS   Arthritis    right shoulder   Atherosclerosis of aorta 02/12/2015   Atherosclerotic coronary vascular disease 2008   s/p right iliac stent   Cataract    COPD (chronic obstructive pulmonary disease) (HCC)    Cough    Depression    DM (diabetes mellitus) (HCC) 03/10/2023   Dyspnea    Environmental and seasonal allergies    Essential hypertension 02/12/2015   Family history of adverse reaction to anesthesia    Father - 45 - MI, then stroke after Prostate surgery   GERD (gastroesophageal reflux disease)    History of hiatal hernia    Hyperlipidemia    Hypertension    Hypothyroidism    Incisional hernia, without obstruction or gangrene    PVD (peripheral vascular disease) 11/17/2016   Venous insufficiency    Vertigo    can happen daily   Wears hearing aid in both ears    has, does not wear   Past Surgical History:  Procedure Laterality Date   CATARACT EXTRACTION W/PHACO Right 03/16/2018   Procedure: CATARACT EXTRACTION PHACO AND INTRAOCULAR LENS PLACEMENT (IOC);  Surgeon: Ferol Rogue, MD;  Location: [facility];  Service: Ophthalmology;  Laterality: Right;  US   01:05 CDE 11.85 Fluid pack lot # [id number]   CATARACT EXTRACTION W/PHACO Left 05/08/2020   Procedure: CATARACT  EXTRACTION PHACO AND INTRAOCULAR LENS PLACEMENT (IOC) LEFT 7.32 00:54.7 13.4%;  Surgeon: Mittie Gaskin, MD;  Location: [facility];  Service: Ophthalmology;  Laterality: Left;   CHOLECYSTECTOMY     COLONOSCOPY  2015   Dr Jinny- cleared for 5 years    COLONOSCOPY WITH PROPOFOL  N/A 01/17/2018   Procedure: COLONOSCOPY WITH PROPOFOL ;  Surgeon: Jinny Carmine, MD;  Location: [facility];  Service: Endoscopy;  Laterality: N/A;  requests early   HERNIA REPAIR  July 2019   INSERTION OF MESH N/A 08/31/2017   Procedure: INSERTION OF MESH;  Surgeon: Nicholaus Selinda Birmingham, MD;  Location: [facility];  Service: General;  Laterality: N/A;   IR CATHETER TUBE CHANGE  08/05/2023   IR RADIOLOGIST EVAL & MGMT  06/01/2023   IR RADIOLOGIST EVAL & MGMT  06/08/2023   IR RADIOLOGIST EVAL & MGMT  07/13/2023   IR RADIOLOGIST EVAL & MGMT  08/05/2023   IR REMOVAL OF CALCULI/DEBRIS BILIARY DUCT/GB  07/09/2023   IRRIGATION AND DEBRIDEMENT ABSCESS N/A 03/10/2023   Procedure: IRRIGATION AND DEBRIDEMENT ABDOMINAL WALL ABSCESS;  Surgeon: Jordis Laneta FALCON, MD;  Location: [facility];  Service: General;  Laterality: N/A;   POLYPECTOMY  01/17/2018   Procedure: POLYPECTOMY;  Surgeon: Jinny Carmine, MD;  Location: [facility];  Service: Endoscopy;;   UMBILICAL HERNIA REPAIR N/A 08/31/2017   Procedure: HERNIA REPAIR UMBILICAL ADULT;  Surgeon: Nicholaus Selinda Birmingham, MD;  Location: [facility];  Service: General;  Laterality: N/A;   VEIN SURGERY     1 stents in R) leg   Patient Active Problem List   Diagnosis Date Noted   Hepatic abscess 03/10/2023   Abdominal pain 03/10/2023   DM (diabetes mellitus) (HCC) 03/10/2023   Sepsis (HCC) 03/10/2023   Abdominal wall abscess 03/10/2023   Bilateral carotid artery stenosis 09/20/2019   Colon cancer screening    Polyp of sigmoid colon    SOBOE (shortness of breath on exertion) 07/30/2017   PVD (peripheral vascular disease) 11/17/2016   Tobacco use disorder 11/17/2016   Reactive  airway disease, mild intermittent, uncomplicated 08/12/2016   Allergic rhinitis due to pollen 08/13/2015   Nocturia 08/13/2015   Atherosclerosis of aorta 02/12/2015   Essential hypertension 02/12/2015   Esophageal reflux 02/12/2015   Depression 02/12/2015   Adult hypothyroidism 02/12/2015   Chronic anxiety 02/12/2015   Hyperlipemia 02/12/2015    PCP: Doretta Button, MD  REFERRING PROVIDER: Alease Oyster, MD  REFERRING DIAG: s/p R reverse total shoulder arthroplasty  RATIONALE FOR EVALUATION AND TREATMENT: Rehabilitation  THERAPY DIAG: S/P reverse total shoulder arthroplasty, right  Stiffness of right shoulder, not elsewhere classified  Muscle weakness (generalized)  ONSET DATE: 03/08/24, R rTSA  FOLLOW-UP APPT SCHEDULED WITH REFERRING PROVIDER: Yes ; 03/28/24   SUBJECTIVE:                                                                                                                                                                                         SUBJECTIVE STATEMENT:  Pt is a 73 year old male s/p R rTSA (DOS: 03/08/24).  PERTINENT HISTORY: Pt is a 73 year old male s/p R rTSA (DOS: 03/08/24).  Pt currently reports some confusion regarding his HEP. Pt has completed given exercises from [facility], but did not understand relevance to his shoulder. Pt denies post-op complications. Patient reports some challenge with comfort at night. Pt is smoker and spends a lot of time in his shop to smoke and work on [employer] working.   PAIN:  Pain Intensity: Present: 2/10, Best: 2/10, Worst: 10/10 Pain location: Anterior arm pain Pain Quality: burning  Radiating: No  Numbness/Tingling: Yes; fleeting N/T in 4th-5th digits and thumb; this has subsided Focal Weakness: No Aggravating factors: inadvertent abduction/ER of R arm Relieving factors: None History of prior shoulder or neck/shoulder injury, pain, surgery, or therapy: Yes; chronic R shoulder pain/RTC arthropathy  Falls: Has  patient fallen in last 6 months? No, Number of falls: N/A Dominant hand: right  Imaging: Yes ;  - X-ray: good post-op follow-up  Red flags (personal history of cancer, chills/fever, night sweats, nausea, vomiting, unrelenting pain, unexplained weight gain/loss): Negative  PRECAUTIONS: Other: s/p R rTSA  WEIGHT BEARING RESTRICTIONS: No  FALLS: Has patient fallen in last 6 months? 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  Recent memory is intact.  Remote memory is intact.  Attention span and concentration are intact.  Expressive speech is intact.  Patient's fund of knowledge is within normal limits for educational level.    Gross Musculoskeletal Assessment Tremor: None Bulk: Normal Tone: Normal No signs of infection  Posture Rounded shoulders, mild forward head  AROM AROM (Normal range in degrees) AROM 03/15/24   Right Left  Shoulder    Flexion 47 142  Extension    Abduction 48 168  External Rotation 15 66  Internal Rotation    Hands Behind Head    Hands Behind Back        Elbow    Flexion    Extension    Pronation    Supination    (* = pain; Blank rows = not tested)  UE MMT: MMT deferred on initial evaluation due to post-op status* MMT (out of 5) Right Left       Shoulder   Flexion    Extension    Abduction    External rotation    Internal rotation    Horizontal abduction    Horizontal adduction    Lower Trapezius    Rhomboids        Elbow  Flexion    Extension    Pronation    Supination        Wrist  Flexion    Extension    Radial deviation    Ulnar deviation        MCP  Flexion     Extension    Abduction    Adduction    (* = pain; Blank rows = not tested)  Sensation Deferred  Reflexes Deferred  Palpation TTP R ACJ (1), R long head of biceps tendon (1), R lateral deltoid (1), R anterior deltoid (1) (Blank rows = not tested) Graded on 0-4 scale (0 = no pain, 1 = pain, 2 = pain with wincing/grimacing/flinching, 3 = pain with withdrawal, 4 = unwilling to allow palpation), (Blank rows = not tested)     TODAY'S TREATMENT   03/21/24 Subjective: Pt reports he is sleeping in the sling.  Not wearing it at home during the day.  He is being careful though and not using his arm for any lifting.  His wife is opening a 2 liter bottle.  Worked on his HEP.  Has a few questions about technique.  Objective: Pain:  0-1/10 Therapeutic Exercise - for HEP establishment, discussion on appropriate exercise/activity modification, PT education PROM R shoulder- flexion, abduction, ER (pt in supine position) Wand AAROM flexion x10 Wand AAROM ER x10 Flexion 120 deg PROM Abd 65 deg PROM ER 35 deg PROM today  Neuro Re-education: Scapular mm MRE with PT: in sidelying- emphasized retraction/depression today with PT tactile,manual,verbal cues.  Then seated scapular retraction x10 after.  10 min.  Reviewed baseline home exercises and provided handout for [employer] program (see Access Code); tactile cueing and therapist demonstration utilized as needed for carryover of proper technique to HEP.  Discussed role of elbow and wrist AROM for maintaining mobility distal to shoulder during period of immobilization.  Self Care: Patient education on current condition, anatomy involved, prognosis, plan of care. Discussion on current activity restrictions, including lifting > weight of coffee cup and reaching behind back. Reviewed at length current ROM restrictions with forward flexion and ER.   Reviewed HEP. Adjusted immobilizer to position the abd pillow more comfortably for pt. Reviewed post  op precautions for ROM including rationale for not reaching behind back with R UE yet  PATIENT EDUCATION:  Education details: see above for patient education details Person educated: Patient Education method: Explanation, Demonstration, and Handouts Education comprehension: verbalized understanding and returned demonstration   HOME EXERCISE PROGRAM:  Access Code: [email or web address] URL: [email or web address] Date: 03/15/2024 Prepared by: Venetia Endo  Exercises - Supine Shoulder Flexion AAROM with Dowel  - 2 x daily - 7 x weekly - 2 sets - 10 reps - Supine Shoulder External Rotation with Dowel  - 2 x daily - 7 x weekly - 2 sets - 10 reps - Seated Scapular Retraction  - 2 x daily - 7 x weekly - 2 sets - 10 reps - 3sec hold   ASSESSMENT:  CLINICAL IMPRESSION: Patient is a 73 y.o. male who was seen today for physical therapy treatment s/p R rTSA (DOS: 03/08/24) with routine healing. Patient has expected post-operative impairments in post-op intermittent pain, edema, dec AROM, dec strength. PROM measurements for R shoulder improved since initial evaluation.  He tolerated today's session well.  Answered questions about HEP and pt verbalizes understanding and expresses motivation to continue with PT POC.  Pt has associated deficits in R upper limb functional use and ability to complete projects in his shop. Pt will continue to benefit from skilled PT services to address deficits and improve function.   OBJECTIVE IMPAIRMENTS: decreased ROM, decreased strength, hypomobility, impaired flexibility, impaired UE functional use, postural dysfunction, and pain.   ACTIVITY LIMITATIONS: carrying, lifting, transfers, bathing, dressing, self feeding, reach over head, and hygiene/grooming  PARTICIPATION LIMITATIONS: meal prep, cleaning, laundry, driving, yard work, and working in his shop  PERSONAL FACTORS: Age, Past/current experiences, and 3+ comorbidities: (chronic tobacco use, PVD,  atherosclerosis of aorta, DM, depression/anxietty, HLD) are also affecting patient's functional outcome.   REHAB POTENTIAL: Good  CLINICAL DECISION MAKING: Stable/uncomplicated  EVALUATION COMPLEXITY: Low   GOALS: Goals reviewed with patient? Yes  SHORT TERM GOALS: Target date: 04/10/2024  Pt will be independent with HEP to improve strength and decrease shoulder pain to improve pain-free function at home and work. Baseline: 03/15/24: Pt continuing exercises from [facility] HEP printout (elbow AROM, wrist AROM, gripping); we updated HEP for shoulder AAROM in limited ROM and scap retractions.  Goal status: INITIAL   LONG TERM GOALS: Target date: 06/01/2024  Pt will have R shoulder active forward elevation to 130 deg as needed for functional ROM for reaching, self-care ADLs, and household tasks Baseline: 03/15/24: R shoulder flexion 47 deg Goal status: INITIAL  2.  Pt will decrease worst shoulder pain by at least 3 points on the NPRS in order to demonstrate clinically significant reduction in shoulder pain. Baseline: 03/15/24: 10/10 at worst.  Goal status: INITIAL  3.  Pt will decrease quick DASH score by at least 8% in order to demonstrate clinically significant reduction in disability related to  shoulder pain        Baseline: 03/15/24: 81.8% Goal status: INITIAL  4. Pt will have MMT 4+/5 or greater for all tested shoulder muscles (deltoid, RTC, periscapular) in order to demonstrate improvement in strength and function         Baseline: 03/15/24: MMT deferred due to early post-op status.  Goal status: INITIAL   PLAN: PT FREQUENCY: 1-2x/week  PT DURATION: 12 weeks  PLANNED INTERVENTIONS: Therapeutic exercises, Therapeutic activity, Neuromuscular re-education, Balance training, Gait training, Patient/Family education, Self Care, Joint mobilization, Joint manipulation, Vestibular training, Canalith repositioning, Orthotic/Fit training, DME instructions, Dry Needling, Electrical  stimulation, Spinal manipulation, Spinal mobilization, Cryotherapy, Moist heat, Taping, Traction, Ultrasound, Ionotophoresis 4mg /ml Dexamethasone , Manual therapy, and Re-evaluation.  PLAN FOR NEXT SESSION: R shoulder PROM within limits of protocol, AA/AROM as tolerated for forward elevation and limited ER ROM, periscapular isometrics   Vernell Reges, PT, DPT, OCS    Vernell FORBES Reges, PT 03/21/2024, 9:30 AM

## 2024-03-22 ENCOUNTER — Ambulatory Visit

## 2024-03-22 DIAGNOSIS — Z96611 Presence of right artificial shoulder joint: Secondary | ICD-10-CM

## 2024-03-22 DIAGNOSIS — M25611 Stiffness of right shoulder, not elsewhere classified: Secondary | ICD-10-CM

## 2024-03-22 DIAGNOSIS — M6281 Muscle weakness (generalized): Secondary | ICD-10-CM

## 2024-03-22 NOTE — Therapy (Signed)
 OUTPATIENT PHYSICAL THERAPY SHOULDER POST-OP EVALUATION  Patient Name: Joel Flores MRN: [id number] DOB:Sep 30, 1950, 73 y.o., male Today's Date: 03/15/2024  END OF SESSION:  PT End of Session - 03/22/24 1134     Visit Number 3    Number of Visits 21    Date for Recertification  05/24/24    Authorization Type [employer]    PT Start Time 1040    PT Stop Time 1120    PT Time Calculation (min) 40 min    Equipment Utilized During Treatment --   post-op R shoulder sling   Activity Tolerance Patient tolerated treatment well    Behavior During Therapy WFL for tasks assessed/performed           Past Medical History:  Diagnosis Date   Allergy 1990   Anginal pain    Anxiety    PANIC ATTACKS   Arthritis    right shoulder   Atherosclerosis of aorta 02/12/2015   Atherosclerotic coronary vascular disease 2008   s/p right iliac stent   Cataract    COPD (chronic obstructive pulmonary disease) (HCC)    Cough    Depression    DM (diabetes mellitus) (HCC) 03/10/2023   Dyspnea    Environmental and seasonal allergies    Essential hypertension 02/12/2015   Family history of adverse reaction to anesthesia    Father - 6 - MI, then stroke after Prostate surgery   GERD (gastroesophageal reflux disease)    History of hiatal hernia    Hyperlipidemia    Hypertension    Hypothyroidism    Incisional hernia, without obstruction or gangrene    PVD (peripheral vascular disease) 11/17/2016   Venous insufficiency    Vertigo    can happen daily   Wears hearing aid in both ears    has, does not wear   Past Surgical History:  Procedure Laterality Date   CATARACT EXTRACTION W/PHACO Right 03/16/2018   Procedure: CATARACT EXTRACTION PHACO AND INTRAOCULAR LENS PLACEMENT (IOC);  Surgeon: Ferol Rogue, MD;  Location: [facility];  Service: Ophthalmology;  Laterality: Right;  US   01:05 CDE 11.85 Fluid pack lot # [id number]   CATARACT EXTRACTION W/PHACO Left 05/08/2020   Procedure: CATARACT  EXTRACTION PHACO AND INTRAOCULAR LENS PLACEMENT (IOC) LEFT 7.32 00:54.7 13.4%;  Surgeon: Mittie Gaskin, MD;  Location: [facility];  Service: Ophthalmology;  Laterality: Left;   CHOLECYSTECTOMY     COLONOSCOPY  2015   Dr Jinny- cleared for 5 years    COLONOSCOPY WITH PROPOFOL  N/A 01/17/2018   Procedure: COLONOSCOPY WITH PROPOFOL ;  Surgeon: Jinny Carmine, MD;  Location: [facility];  Service: Endoscopy;  Laterality: N/A;  requests early   HERNIA REPAIR  July 2019   INSERTION OF MESH N/A 08/31/2017   Procedure: INSERTION OF MESH;  Surgeon: Nicholaus Selinda Birmingham, MD;  Location: [facility];  Service: General;  Laterality: N/A;   IR CATHETER TUBE CHANGE  08/05/2023   IR RADIOLOGIST EVAL & MGMT  06/01/2023   IR RADIOLOGIST EVAL & MGMT  06/08/2023   IR RADIOLOGIST EVAL & MGMT  07/13/2023   IR RADIOLOGIST EVAL & MGMT  08/05/2023   IR REMOVAL OF CALCULI/DEBRIS BILIARY DUCT/GB  07/09/2023   IRRIGATION AND DEBRIDEMENT ABSCESS N/A 03/10/2023   Procedure: IRRIGATION AND DEBRIDEMENT ABDOMINAL WALL ABSCESS;  Surgeon: Jordis Laneta FALCON, MD;  Location: [facility];  Service: General;  Laterality: N/A;   POLYPECTOMY  01/17/2018   Procedure: POLYPECTOMY;  Surgeon: Jinny Carmine, MD;  Location: [facility];  Service: Endoscopy;;   UMBILICAL HERNIA REPAIR N/A 08/31/2017   Procedure: HERNIA REPAIR UMBILICAL ADULT;  Surgeon: Nicholaus Selinda Birmingham, MD;  Location: [facility];  Service: General;  Laterality: N/A;   VEIN SURGERY     1 stents in R) leg   Patient Active Problem List   Diagnosis Date Noted   Hepatic abscess 03/10/2023   Abdominal pain 03/10/2023   DM (diabetes mellitus) (HCC) 03/10/2023   Sepsis (HCC) 03/10/2023   Abdominal wall abscess 03/10/2023   Bilateral carotid artery stenosis 09/20/2019   Colon cancer screening    Polyp of sigmoid colon    SOBOE (shortness of breath on exertion) 07/30/2017   PVD (peripheral vascular disease) 11/17/2016   Tobacco use disorder 11/17/2016   Reactive  airway disease, mild intermittent, uncomplicated 08/12/2016   Allergic rhinitis due to pollen 08/13/2015   Nocturia 08/13/2015   Atherosclerosis of aorta 02/12/2015   Essential hypertension 02/12/2015   Esophageal reflux 02/12/2015   Depression 02/12/2015   Adult hypothyroidism 02/12/2015   Chronic anxiety 02/12/2015   Hyperlipemia 02/12/2015    PCP: Doretta Button, MD  REFERRING PROVIDER: Alease Oyster, MD  REFERRING DIAG: s/p R reverse total shoulder arthroplasty  RATIONALE FOR EVALUATION AND TREATMENT: Rehabilitation  THERAPY DIAG: S/P reverse total shoulder arthroplasty, right  Stiffness of right shoulder, not elsewhere classified  Muscle weakness (generalized)  ONSET DATE: 03/08/24, R rTSA  FOLLOW-UP APPT SCHEDULED WITH REFERRING PROVIDER: Yes ; 03/28/24   SUBJECTIVE:                                                                                                                                                                                         SUBJECTIVE STATEMENT:  Pt is a 73 year old male s/p R rTSA (DOS: 03/08/24).  PERTINENT HISTORY: Pt is a 73 year old male s/p R rTSA (DOS: 03/08/24).  Pt currently reports some confusion regarding his HEP. Pt has completed given exercises from [facility], but did not understand relevance to his shoulder. Pt denies post-op complications. Patient reports some challenge with comfort at night. Pt is smoker and spends a lot of time in his shop to smoke and work on [employer] working.   PAIN:  Pain Intensity: Present: 2/10, Best: 2/10, Worst: 10/10 Pain location: Anterior arm pain Pain Quality: burning  Radiating: No  Numbness/Tingling: Yes; fleeting N/T in 4th-5th digits and thumb; this has subsided Focal Weakness: No Aggravating factors: inadvertent abduction/ER of R arm Relieving factors: None History of prior shoulder or neck/shoulder injury, pain, surgery, or therapy: Yes; chronic R shoulder pain/RTC arthropathy  Falls: Has  patient fallen in last 6 months? No, Number of falls: N/A Dominant hand: right  Imaging: Yes ;  - X-ray: good post-op follow-up  Red flags (personal history of cancer, chills/fever, night sweats, nausea, vomiting, unrelenting pain, unexplained weight gain/loss): Negative  PRECAUTIONS: Other: s/p R rTSA  WEIGHT BEARING RESTRICTIONS: No  FALLS: Has patient fallen in last 6 months? 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  Recent memory is intact.  Remote memory is intact.  Attention span and concentration are intact.  Expressive speech is intact.  Patient's fund of knowledge is within normal limits for educational level.    Gross Musculoskeletal Assessment Tremor: None Bulk: Normal Tone: Normal No signs of infection  Posture Rounded shoulders, mild forward head  AROM AROM (Normal range in degrees) AROM 03/15/24   Right Left  Shoulder    Flexion 47 142  Extension    Abduction 48 168  External Rotation 15 66  Internal Rotation    Hands Behind Head    Hands Behind Back        Elbow    Flexion    Extension    Pronation    Supination    (* = pain; Blank rows = not tested)  UE MMT: MMT deferred on initial evaluation due to post-op status* MMT (out of 5) Right Left       Shoulder   Flexion    Extension    Abduction    External rotation    Internal rotation    Horizontal abduction    Horizontal adduction    Lower Trapezius    Rhomboids        Elbow  Flexion    Extension    Pronation    Supination        Wrist  Flexion    Extension    Radial deviation    Ulnar deviation        MCP  Flexion     Extension    Abduction    Adduction    (* = pain; Blank rows = not tested)  Sensation Deferred  Reflexes Deferred  Palpation TTP R ACJ (1), R long head of biceps tendon (1), R lateral deltoid (1), R anterior deltoid (1) (Blank rows = not tested) Graded on 0-4 scale (0 = no pain, 1 = pain, 2 = pain with wincing/grimacing/flinching, 3 = pain with withdrawal, 4 = unwilling to allow palpation), (Blank rows = not tested)     TODAY'S TREATMENT   03/21/24 Subjective: Pt reports he is sleeping in the sling.  Not wearing it at home during the day.  He is being careful though and not using his arm for any lifting.  His wife is opening a 2 liter bottle.  Worked on his HEP.  Has a few questions about technique.  Objective: Pain:  0-1/10 Therapeutic Exercise - for HEP establishment, discussion on appropriate exercise/activity modification, PT education Pulleys for forward elevation AAROM: with 5-10 second holds x 10, 2 sets PROM R shoulder- flexion, abduction, ER (pt in supine position) Wand AAROM flexion x10 Wand AAROM ER x10 Flexion 120 deg PROM Abd 75 deg PROM ER 40 deg PROM today  Neuro Re-education: Scapular mm MRE with PT: in sidelying- emphasized retraction/depression today with  PT tactile,manual,verbal cues.  Then seated scapular retraction x10 after.  10 min.   Reviewed baseline home exercises and provided handout for [employer] program (see Access Code); tactile cueing and therapist demonstration utilized as needed for carryover of proper technique to HEP.  Discussed role of elbow and wrist AROM for maintaining mobility distal to shoulder during period of immobilization.  Self Care: Patient education on current condition, anatomy involved, prognosis, plan of care. Discussion on current activity restrictions, including lifting > weight of coffee cup and reaching behind back. Reviewed at length current ROM restrictions with forward flexion and ER.   Reviewed HEP. Adjusted  immobilizer to position the abd pillow more comfortably for pt. Reviewed post op precautions for ROM including rationale for not reaching behind back with R UE yet  PATIENT EDUCATION:  Education details: see above for patient education details Person educated: Patient Education method: Explanation, Demonstration, and Handouts Education comprehension: verbalized understanding and returned demonstration   HOME EXERCISE PROGRAM:  Access Code: [email or web address] URL: [email or web address] Date: 03/15/2024 Prepared by: Venetia Endo  Exercises - Supine Shoulder Flexion AAROM with Dowel  - 2 x daily - 7 x weekly - 2 sets - 10 reps - Supine Shoulder External Rotation with Dowel  - 2 x daily - 7 x weekly - 2 sets - 10 reps - Seated Scapular Retraction  - 2 x daily - 7 x weekly - 2 sets - 10 reps - 3sec hold   ASSESSMENT:  CLINICAL IMPRESSION: Patient is a 73 y.o. male who was seen today for physical therapy treatment s/p R rTSA (DOS: 03/08/24) with routine healing. Patient has expected post-operative impairments in post-op intermittent pain, edema, dec AROM, dec strength. PROM measurements for R shoulder improved today compared to earlier this week.  Able to activate mid trap/low trap with improved motor control during MRE's today.  Answered questions about HEP and pt verbalizes understanding and expresses motivation to continue with PT POC.  Pt has associated deficits in R upper limb functional use and ability to complete projects in his shop. Pt will continue to benefit from skilled PT services to address deficits and improve function.   OBJECTIVE IMPAIRMENTS: decreased ROM, decreased strength, hypomobility, impaired flexibility, impaired UE functional use, postural dysfunction, and pain.   ACTIVITY LIMITATIONS: carrying, lifting, transfers, bathing, dressing, self feeding, reach over head, and hygiene/grooming  PARTICIPATION LIMITATIONS: meal prep, cleaning, laundry, driving, yard  work, and working in his shop  PERSONAL FACTORS: Age, Past/current experiences, and 3+ comorbidities: (chronic tobacco use, PVD, atherosclerosis of aorta, DM, depression/anxietty, HLD) are also affecting patient's functional outcome.   REHAB POTENTIAL: Good  CLINICAL DECISION MAKING: Stable/uncomplicated  EVALUATION COMPLEXITY: Low   GOALS: Goals reviewed with patient? Yes  SHORT TERM GOALS: Target date: 04/10/2024  Pt will be independent with HEP to improve strength and decrease shoulder pain to improve pain-free function at home and work. Baseline: 03/15/24: Pt continuing exercises from [facility] HEP printout (elbow AROM, wrist AROM, gripping); we updated HEP for shoulder AAROM in limited ROM and scap retractions.  Goal status: INITIAL   LONG TERM GOALS: Target date: 06/01/2024  Pt will have R shoulder active forward elevation to 130 deg as needed for functional ROM for reaching, self-care ADLs, and household tasks Baseline: 03/15/24: R shoulder flexion 47 deg Goal status: INITIAL  2.  Pt will decrease worst shoulder pain by at least 3 points on the NPRS in order to demonstrate clinically significant reduction in shoulder pain. Baseline: 03/15/24: 10/10 at worst.  Goal  status: INITIAL  3.  Pt will decrease quick DASH score by at least 8% in order to demonstrate clinically significant reduction in disability related to shoulder pain        Baseline: 03/15/24: 81.8% Goal status: INITIAL  4. Pt will have MMT 4+/5 or greater for all tested shoulder muscles (deltoid, RTC, periscapular) in order to demonstrate improvement in strength and function         Baseline: 03/15/24: MMT deferred due to early post-op status.  Goal status: INITIAL   PLAN: PT FREQUENCY: 1-2x/week  PT DURATION: 12 weeks  PLANNED INTERVENTIONS: Therapeutic exercises, Therapeutic activity, Neuromuscular re-education, Balance training, Gait training, Patient/Family education, Self Care, Joint mobilization, Joint  manipulation, Vestibular training, Canalith repositioning, Orthotic/Fit training, DME instructions, Dry Needling, Electrical stimulation, Spinal manipulation, Spinal mobilization, Cryotherapy, Moist heat, Taping, Traction, Ultrasound, Ionotophoresis 4mg /ml Dexamethasone , Manual therapy, and Re-evaluation.  PLAN FOR NEXT SESSION: R shoulder PROM within limits of protocol, AA/AROM as tolerated for forward elevation and limited ER ROM, periscapular isometrics   Vernell Reges, PT, DPT, OCS    Vernell FORBES Reges, PT 03/22/2024, 11:35 AM

## 2024-03-29 ENCOUNTER — Encounter: Payer: Self-pay | Admitting: Physical Therapy

## 2024-03-29 ENCOUNTER — Ambulatory Visit: Admitting: Physical Therapy

## 2024-03-29 DIAGNOSIS — Z96611 Presence of right artificial shoulder joint: Secondary | ICD-10-CM | POA: Diagnosis present

## 2024-03-29 DIAGNOSIS — M6281 Muscle weakness (generalized): Secondary | ICD-10-CM | POA: Diagnosis present

## 2024-03-29 DIAGNOSIS — M25611 Stiffness of right shoulder, not elsewhere classified: Secondary | ICD-10-CM | POA: Diagnosis present

## 2024-03-29 NOTE — Therapy (Signed)
 OUTPATIENT PHYSICAL THERAPY SHOULDER POST-OP TREATMENT  Patient Name: Joel Flores MRN: [id number] DOB:13-Dec-1950, 73 y.o., male Today's Date: 03/29/2024  END OF SESSION:  PT End of Session - 03/29/24 1058     Visit Number 4    Number of Visits 21    Date for Recertification  05/24/24    Authorization Type [employer]    PT Start Time 1052    PT Stop Time 1135    PT Time Calculation (min) 43 min    Equipment Utilized During Treatment --   post-op R shoulder sling   Activity Tolerance Patient tolerated treatment well    Behavior During Therapy WFL for tasks assessed/performed            Past Medical History:  Diagnosis Date   Allergy 1990   Anginal pain    Anxiety    PANIC ATTACKS   Arthritis    right shoulder   Atherosclerosis of aorta 02/12/2015   Atherosclerotic coronary vascular disease 2008   s/p right iliac stent   Cataract    COPD (chronic obstructive pulmonary disease) (HCC)    Cough    Depression    DM (diabetes mellitus) (HCC) 03/10/2023   Dyspnea    Environmental and seasonal allergies    Essential hypertension 02/12/2015   Family history of adverse reaction to anesthesia    Father - 44 - MI, then stroke after Prostate surgery   GERD (gastroesophageal reflux disease)    History of hiatal hernia    Hyperlipidemia    Hypertension    Hypothyroidism    Incisional hernia, without obstruction or gangrene    PVD (peripheral vascular disease) 11/17/2016   Venous insufficiency    Vertigo    can happen daily   Wears hearing aid in both ears    has, does not wear   Past Surgical History:  Procedure Laterality Date   CATARACT EXTRACTION W/PHACO Right 03/16/2018   Procedure: CATARACT EXTRACTION PHACO AND INTRAOCULAR LENS PLACEMENT (IOC);  Surgeon: Ferol Rogue, MD;  Location: [facility];  Service: Ophthalmology;  Laterality: Right;  US   01:05 CDE 11.85 Fluid pack lot # [id number]   CATARACT EXTRACTION W/PHACO Left 05/08/2020   Procedure: CATARACT  EXTRACTION PHACO AND INTRAOCULAR LENS PLACEMENT (IOC) LEFT 7.32 00:54.7 13.4%;  Surgeon: Mittie Gaskin, MD;  Location: [facility];  Service: Ophthalmology;  Laterality: Left;   CHOLECYSTECTOMY     COLONOSCOPY  2015   Dr Jinny- cleared for 5 years    COLONOSCOPY WITH PROPOFOL  N/A 01/17/2018   Procedure: COLONOSCOPY WITH PROPOFOL ;  Surgeon: Jinny Carmine, MD;  Location: [facility];  Service: Endoscopy;  Laterality: N/A;  requests early   HERNIA REPAIR  July 2019   INSERTION OF MESH N/A 08/31/2017   Procedure: INSERTION OF MESH;  Surgeon: Nicholaus Selinda Birmingham, MD;  Location: [facility];  Service: General;  Laterality: N/A;   IR CATHETER TUBE CHANGE  08/05/2023   IR RADIOLOGIST EVAL & MGMT  06/01/2023   IR RADIOLOGIST EVAL & MGMT  06/08/2023   IR RADIOLOGIST EVAL & MGMT  07/13/2023   IR RADIOLOGIST EVAL & MGMT  08/05/2023   IR REMOVAL OF CALCULI/DEBRIS BILIARY DUCT/GB  07/09/2023   IRRIGATION AND DEBRIDEMENT ABSCESS N/A 03/10/2023   Procedure: IRRIGATION AND DEBRIDEMENT ABDOMINAL WALL ABSCESS;  Surgeon: Jordis Laneta FALCON, MD;  Location: [facility];  Service: General;  Laterality: N/A;   POLYPECTOMY  01/17/2018   Procedure: POLYPECTOMY;  Surgeon: Jinny Carmine, MD;  Location: [facility];  Service: Endoscopy;;   UMBILICAL HERNIA REPAIR N/A 08/31/2017   Procedure: HERNIA REPAIR UMBILICAL ADULT;  Surgeon: Nicholaus Selinda Birmingham, MD;  Location: [facility];  Service: General;  Laterality: N/A;   VEIN SURGERY     1 stents in R) leg   Patient Active Problem List   Diagnosis Date Noted   Hepatic abscess 03/10/2023   Abdominal pain 03/10/2023   DM (diabetes mellitus) (HCC) 03/10/2023   Sepsis (HCC) 03/10/2023   Abdominal wall abscess 03/10/2023   Bilateral carotid artery stenosis 09/20/2019   Colon cancer screening    Polyp of sigmoid colon    SOBOE (shortness of breath on exertion) 07/30/2017   PVD (peripheral vascular disease) 11/17/2016   Tobacco use disorder 11/17/2016   Reactive  airway disease, mild intermittent, uncomplicated 08/12/2016   Allergic rhinitis due to pollen 08/13/2015   Nocturia 08/13/2015   Atherosclerosis of aorta 02/12/2015   Essential hypertension 02/12/2015   Esophageal reflux 02/12/2015   Depression 02/12/2015   Adult hypothyroidism 02/12/2015   Chronic anxiety 02/12/2015   Hyperlipemia 02/12/2015    PCP: Doretta Button, MD  REFERRING PROVIDER: Alease Oyster, MD  REFERRING DIAG: s/p R reverse total shoulder arthroplasty  RATIONALE FOR EVALUATION AND TREATMENT: Rehabilitation  THERAPY DIAG: S/P reverse total shoulder arthroplasty, right  Stiffness of right shoulder, not elsewhere classified  Muscle weakness (generalized)  ONSET DATE: 03/08/24, R rTSA  FOLLOW-UP APPT SCHEDULED WITH REFERRING PROVIDER: Yes ; 03/28/24  PERTINENT HISTORY: Pt is a 73 year old male s/p R rTSA (DOS: 03/08/24).  Pt currently reports some confusion regarding his HEP. Pt has completed given exercises from [facility], but did not understand relevance to his shoulder. Pt denies post-op complications. Patient reports some challenge with comfort at night. Pt is smoker and spends a lot of time in his shop to smoke and work on [employer] working.   PAIN:  Pain Intensity: Present: 2/10, Best: 2/10, Worst: 10/10 Pain location: Anterior arm pain Pain Quality: burning  Radiating: No  Numbness/Tingling: Yes; fleeting N/T in 4th-5th digits and thumb; this has subsided Focal Weakness: No Aggravating factors: inadvertent abduction/ER of R arm Relieving factors: None History of prior shoulder or neck/shoulder injury, pain, surgery, or therapy: Yes; chronic R shoulder pain/RTC arthropathy  Falls: Has patient fallen in last 6 months? No, Number of falls: N/A Dominant hand: right Imaging: Yes ;  - X-ray: good post-op follow-up  Red flags (personal history of cancer, chills/fever, night sweats, nausea, vomiting, unrelenting pain, unexplained weight gain/loss):  Negative  PRECAUTIONS: Other: s/p R rTSA  WEIGHT BEARING RESTRICTIONS: No  FALLS: Has patient fallen in last 6 months? 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)  Patient Goals: Able to move arm without pain    OBJECTIVE (data from initial evaluation unless otherwise dated):   Patient Surveys  QuickDASH: 81.8%  Posture Rounded shoulders, mild forward head  AROM AROM (Normal range in degrees) AROM 03/15/24   Right Left  Shoulder    Flexion 47 142  Extension    Abduction 48 168  External Rotation 15 66  Internal Rotation    Hands Behind Head    Hands Behind Back        Elbow    Flexion    Extension    Pronation    Supination    (* = pain; Blank rows = not tested)  UE MMT: MMT deferred on initial evaluation due to post-op status* MMT (out of 5) Right Left       Shoulder   Flexion    Extension    Abduction    External rotation    Internal rotation    Horizontal abduction    Horizontal adduction    Lower Trapezius    Rhomboids        Elbow  Flexion    Extension    Pronation    Supination        Wrist  Flexion    Extension    Radial deviation    Ulnar deviation        MCP  Flexion    Extension    Abduction    Adduction    (* = pain; Blank rows = not tested)  Sensation Deferred  Reflexes Deferred  Palpation TTP R ACJ (1), R long head of biceps tendon (1), R lateral deltoid (1), R anterior deltoid (1) (Blank rows = not tested) Graded on 0-4 scale (0 = no pain, 1 = pain, 2 = pain with wincing/grimacing/flinching, 3 = pain with withdrawal, 4 = unwilling to allow palpation), (Blank rows = not tested)     TODAY'S TREATMENT      03/29/2024   SUBJECTIVE STATEMENT:    Patient reports discomfort in his R shoulder with inadvertently moving into  horizontal abduction when lying on his L side during sleeping. He feels that he needs to tighten his sling more (without abduction pillow now as instructed by orthopedist's office). He reports no notable pain at rest today. He does not use sling during the day, but wears for resting/sleeping at night. Pt is compliant with HEP. He had follow-up with surgeon's office; he was informed about expectation for ROM after rTSA and precaution with external rotation.    Objective: Pain:  1/10   Therapeutic Exercise - for shoulder ROM as needed for reaching and self-care ADLs  ROM restrictions: Flexion 120 deg PROM Abd 75 deg PROM ER 40 deg PROM today  PROM R shoulder- flexion, abduction, ER (pt in supine position); x 8 minutes  Wand AAROM flexion; 1 x 10  -reviewed and discussed allowed ROM per protocol Wand AAROM ER; 1 x 10 -reviewed and discussed allowed ROM per protocol Shoulder flexion AROM in supine; 1 x 10  Pulleys for forward elevation AAROM: with 5-10 second holds x 10, 2 sets  PATIENT EDUCATION: Reviewed HEP; discussed at length current ROM precautions and limited ROM to be completed with HEP. Discussed strategies for limiting cross-body adduction when lying in bed/sleeping.    PATIENT EDUCATION:  Education details: see above for patient education details Person educated: Patient Education method: Explanation, Demonstration, and Handouts Education comprehension: verbalized understanding and returned demonstration   HOME EXERCISE PROGRAM:  Access Code: [email or web address] URL: [email or web address] Date: 03/15/2024 Prepared by: Venetia Endo  Exercises - Supine Shoulder Flexion AAROM with Dowel  - 2 x daily - 7 x weekly - 2 sets - 10 reps - Supine Shoulder External Rotation with Dowel  - 2 x daily - 7 x weekly - 2 sets - 10 reps - Seated Scapular Retraction  - 2 x daily - 7 x weekly - 2 sets - 10 reps - 3sec hold   ASSESSMENT:  CLINICAL IMPRESSION: Patient is able  to tolerate passive and active-assisted flexion and abduction ROM up to current limits of protocol for weeks 2-6. Patient has limited ER below current ROM restriction. He has minimal pain  largely at baseline, but he has experienced some challenges with positioning during sleeping and feels that his arm moves more into horizontal adduction inadvertently; he feels that this leads to more soreness. We discussed strategies for positioning, and pt elects to velcro waist strap/belt onto sling to hold arm closer to side. Pt will continue to benefit from skilled PT services to address deficits and improve function.   OBJECTIVE IMPAIRMENTS: decreased ROM, decreased strength, hypomobility, impaired flexibility, impaired UE functional use, postural dysfunction, and pain.   ACTIVITY LIMITATIONS: carrying, lifting, transfers, bathing, dressing, self feeding, reach over head, and hygiene/grooming  PARTICIPATION LIMITATIONS: meal prep, cleaning, laundry, driving, yard work, and working in his shop  PERSONAL FACTORS: Age, Past/current experiences, and 3+ comorbidities: (chronic tobacco use, PVD, atherosclerosis of aorta, DM, depression/anxietty, HLD) are also affecting patient's functional outcome.   REHAB POTENTIAL: Good  CLINICAL DECISION MAKING: Stable/uncomplicated  EVALUATION COMPLEXITY: Low   GOALS: Goals reviewed with patient? Yes  SHORT TERM GOALS: Target date: 04/10/2024  Pt will be independent with HEP to improve strength and decrease shoulder pain to improve pain-free function at home and work. Baseline: 03/15/24: Pt continuing exercises from [facility] HEP printout (elbow AROM, wrist AROM, gripping); we updated HEP for shoulder AAROM in limited ROM and scap retractions.  Goal status: INITIAL   LONG TERM GOALS: Target date: 06/01/2024  Pt will have R shoulder active forward elevation to 130 deg as needed for functional ROM for reaching, self-care ADLs, and household tasks Baseline: 03/15/24: R  shoulder flexion 47 deg Goal status: INITIAL  2.  Pt will decrease worst shoulder pain by at least 3 points on the NPRS in order to demonstrate clinically significant reduction in shoulder pain. Baseline: 03/15/24: 10/10 at worst.  Goal status: INITIAL  3.  Pt will decrease quick DASH score by at least 8% in order to demonstrate clinically significant reduction in disability related to shoulder pain        Baseline: 03/15/24: 81.8% Goal status: INITIAL  4. Pt will have MMT 4+/5 or greater for all tested shoulder muscles (deltoid, RTC, periscapular) in order to demonstrate improvement in strength and function      Baseline: 03/15/24: MMT deferred due to early post-op status.  Goal status: INITIAL   PLAN: PT FREQUENCY: 1-2x/week  PT DURATION: 12 weeks  PLANNED INTERVENTIONS: Therapeutic exercises, Therapeutic activity, Neuromuscular re-education, Balance training, Gait training, Patient/Family education, Self Care, Joint mobilization, Joint manipulation, Vestibular training, Canalith repositioning, Orthotic/Fit training, DME instructions, Dry Needling, Electrical stimulation, Spinal manipulation, Spinal mobilization, Cryotherapy, Moist heat, Taping, Traction, Ultrasound, Ionotophoresis 4mg /ml Dexamethasone , Manual therapy, and Re-evaluation.  PLAN FOR NEXT SESSION: R shoulder PROM within limits of protocol, AA/AROM as tolerated for forward elevation and limited ER ROM, periscapular isometrics  Venetia Endo, PT, DPT #[id number]  Venetia ONEIDA Endo, PT 03/29/2024, 12:52 PM

## 2024-03-30 ENCOUNTER — Ambulatory Visit: Admitting: Physical Therapy

## 2024-03-30 DIAGNOSIS — M25611 Stiffness of right shoulder, not elsewhere classified: Secondary | ICD-10-CM

## 2024-03-30 DIAGNOSIS — M6281 Muscle weakness (generalized): Secondary | ICD-10-CM

## 2024-03-30 DIAGNOSIS — Z96611 Presence of right artificial shoulder joint: Secondary | ICD-10-CM | POA: Diagnosis not present

## 2024-03-30 NOTE — Therapy (Signed)
 OUTPATIENT PHYSICAL THERAPY SHOULDER POST-OP TREATMENT  Patient Name: Joel Flores MRN: [id number] DOB:June 20, 1950, 73 y.o., male Today's Date: 03/30/2024  END OF SESSION:  PT End of Session - 03/30/24 0735     Visit Number 5    Number of Visits 21    Date for Recertification  05/24/24    Authorization Type [employer]    PT Start Time 0735    PT Stop Time 0814    PT Time Calculation (min) 39 min    Equipment Utilized During Treatment --   post-op R shoulder sling   Activity Tolerance Patient tolerated treatment well    Behavior During Therapy [facility] for tasks assessed/performed            Past Medical History:  Diagnosis Date   Allergy 1990   Anginal pain    Anxiety    PANIC ATTACKS   Arthritis    right shoulder   Atherosclerosis of aorta 02/12/2015   Atherosclerotic coronary vascular disease 2008   s/p right iliac stent   Cataract    COPD (chronic obstructive pulmonary disease) (HCC)    Cough    Depression    DM (diabetes mellitus) (HCC) 03/10/2023   Dyspnea    Environmental and seasonal allergies    Essential hypertension 02/12/2015   Family history of adverse reaction to anesthesia    Father - 20 - MI, then stroke after Prostate surgery   GERD (gastroesophageal reflux disease)    History of hiatal hernia    Hyperlipidemia    Hypertension    Hypothyroidism    Incisional hernia, without obstruction or gangrene    PVD (peripheral vascular disease) 11/17/2016   Venous insufficiency    Vertigo    can happen daily   Wears hearing aid in both ears    has, does not wear   Past Surgical History:  Procedure Laterality Date   CATARACT EXTRACTION W/PHACO Right 03/16/2018   Procedure: CATARACT EXTRACTION PHACO AND INTRAOCULAR LENS PLACEMENT (IOC);  Surgeon: Ferol Rogue, MD;  Location: [facility];  Service: Ophthalmology;  Laterality: Right;  US   01:05 CDE 11.85 Fluid pack lot # [id number]   CATARACT EXTRACTION W/PHACO Left 05/08/2020   Procedure: CATARACT  EXTRACTION PHACO AND INTRAOCULAR LENS PLACEMENT (IOC) LEFT 7.32 00:54.7 13.4%;  Surgeon: Mittie Gaskin, MD;  Location: [facility];  Service: Ophthalmology;  Laterality: Left;   CHOLECYSTECTOMY     COLONOSCOPY  2015   Dr Jinny- cleared for 5 years    COLONOSCOPY WITH PROPOFOL  N/A 01/17/2018   Procedure: COLONOSCOPY WITH PROPOFOL ;  Surgeon: Jinny Carmine, MD;  Location: [facility];  Service: Endoscopy;  Laterality: N/A;  requests early   HERNIA REPAIR  July 2019   INSERTION OF MESH N/A 08/31/2017   Procedure: INSERTION OF MESH;  Surgeon: Nicholaus Selinda Birmingham, MD;  Location: [facility];  Service: General;  Laterality: N/A;   IR CATHETER TUBE CHANGE  08/05/2023   IR RADIOLOGIST EVAL & MGMT  06/01/2023   IR RADIOLOGIST EVAL & MGMT  06/08/2023   IR RADIOLOGIST EVAL & MGMT  07/13/2023   IR RADIOLOGIST EVAL & MGMT  08/05/2023   IR REMOVAL OF CALCULI/DEBRIS BILIARY DUCT/GB  07/09/2023   IRRIGATION AND DEBRIDEMENT ABSCESS N/A 03/10/2023   Procedure: IRRIGATION AND DEBRIDEMENT ABDOMINAL WALL ABSCESS;  Surgeon: Jordis Laneta FALCON, MD;  Location: [facility];  Service: General;  Laterality: N/A;   POLYPECTOMY  01/17/2018   Procedure: POLYPECTOMY;  Surgeon: Jinny Carmine, MD;  Location: [facility];  Service: Endoscopy;;   UMBILICAL HERNIA REPAIR N/A 08/31/2017   Procedure: HERNIA REPAIR UMBILICAL ADULT;  Surgeon: Nicholaus Selinda Birmingham, MD;  Location: [facility];  Service: General;  Laterality: N/A;   VEIN SURGERY     1 stents in R) leg   Patient Active Problem List   Diagnosis Date Noted   Hepatic abscess 03/10/2023   Abdominal pain 03/10/2023   DM (diabetes mellitus) (HCC) 03/10/2023   Sepsis (HCC) 03/10/2023   Abdominal wall abscess 03/10/2023   Bilateral carotid artery stenosis 09/20/2019   Colon cancer screening    Polyp of sigmoid colon    SOBOE (shortness of breath on exertion) 07/30/2017   PVD (peripheral vascular disease) 11/17/2016   Tobacco use disorder 11/17/2016   Reactive  airway disease, mild intermittent, uncomplicated 08/12/2016   Allergic rhinitis due to pollen 08/13/2015   Nocturia 08/13/2015   Atherosclerosis of aorta 02/12/2015   Essential hypertension 02/12/2015   Esophageal reflux 02/12/2015   Depression 02/12/2015   Adult hypothyroidism 02/12/2015   Chronic anxiety 02/12/2015   Hyperlipemia 02/12/2015    PCP: Doretta Button, MD  REFERRING PROVIDER: Alease Oyster, MD  REFERRING DIAG: s/p R reverse total shoulder arthroplasty  RATIONALE FOR EVALUATION AND TREATMENT: Rehabilitation  THERAPY DIAG: No diagnosis found.  ONSET DATE: 03/08/24, R rTSA  FOLLOW-UP APPT SCHEDULED WITH REFERRING PROVIDER: Yes ; 03/28/24  PERTINENT HISTORY: Pt is a 73 year old male s/p R rTSA (DOS: 03/08/24).  Pt currently reports some confusion regarding his HEP. Pt has completed given exercises from [facility], but did not understand relevance to his shoulder. Pt denies post-op complications. Patient reports some challenge with comfort at night. Pt is smoker and spends a lot of time in his shop to smoke and work on [employer] working.   PAIN:  Pain Intensity: Present: 2/10, Best: 2/10, Worst: 10/10 Pain location: Anterior arm pain Pain Quality: burning  Radiating: No  Numbness/Tingling: Yes; fleeting N/T in 4th-5th digits and thumb; this has subsided Focal Weakness: No Aggravating factors: inadvertent abduction/ER of R arm Relieving factors: None History of prior shoulder or neck/shoulder injury, pain, surgery, or therapy: Yes; chronic R shoulder pain/RTC arthropathy  Falls: Has patient fallen in last 6 months? No, Number of falls: N/A Dominant hand: right Imaging: Yes ;  - X-ray: good post-op follow-up  Red flags (personal history of cancer, chills/fever, night sweats, nausea, vomiting, unrelenting pain, unexplained weight gain/loss): Negative  PRECAUTIONS: Other: s/p R rTSA  WEIGHT BEARING RESTRICTIONS: No  FALLS: Has patient fallen in last 6 months?  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)  Patient Goals: Able to move arm without pain    OBJECTIVE (data from initial evaluation unless otherwise dated):   Patient Surveys  QuickDASH: 81.8%  Posture Rounded shoulders, mild forward head  AROM AROM (Normal range in degrees) AROM 03/15/24   Right Left  Shoulder    Flexion 47 142  Extension    Abduction 48 168  External Rotation 15 66  Internal Rotation    Hands Behind Head    Hands Behind Back        Elbow    Flexion    Extension    Pronation    Supination    (* = pain; Blank rows = not tested)  UE MMT: MMT deferred on initial evaluation due to post-op status* MMT (out of  5) Right Left       Shoulder   Flexion    Extension    Abduction    External rotation    Internal rotation    Horizontal abduction    Horizontal adduction    Lower Trapezius    Rhomboids        Elbow  Flexion    Extension    Pronation    Supination        Wrist  Flexion    Extension    Radial deviation    Ulnar deviation        MCP  Flexion    Extension    Abduction    Adduction    (* = pain; Blank rows = not tested)  Sensation Deferred  Reflexes Deferred  Palpation TTP R ACJ (1), R long head of biceps tendon (1), R lateral deltoid (1), R anterior deltoid (1) (Blank rows = not tested) Graded on 0-4 scale (0 = no pain, 1 = pain, 2 = pain with wincing/grimacing/flinching, 3 = pain with withdrawal, 4 = unwilling to allow palpation), (Blank rows = not tested)     TODAY'S TREATMENT    03/30/2024   SUBJECTIVE STATEMENT:    Patient reports minimal pain at this time, intermittent twinge with active/active-assisted forward elevation of shoulder. He was here just yesterday, and has had limited time to fully complete HEP.    Objective: Pain:  1/10   Therapeutic Exercise - for shoulder ROM as needed for reaching and self-care ADLs  ROM restrictions: Flexion 120 deg PROM Abd 75 deg PROM ER 40 deg PROM today  PROM R shoulder- flexion, abduction, ER (pt in supine position); x 8 minutes  Reclined on wedge; Wand AAROM flexion; 2 x 10  -reviewed and discussed allowed ROM per protocol  Shoulder flexion AROM in supine; 2 x 10  Pulleys for forward elevation AAROM: with 5-10 second holds x 10, 2 sets  Table slide, forward flexion; 2 x 10, to 120 deg only   PATIENT EDUCATION: Reviewed HEP;  discussed use of supine flexion AROM and table slide at home.    *not today* Wand AAROM ER; 1 x 10 -reviewed and discussed allowed ROM per protocol   PATIENT EDUCATION:  Education details: see above for patient education details Person educated: Patient Education method: Explanation, Demonstration, and Handouts Education comprehension: verbalized understanding and returned demonstration   HOME EXERCISE PROGRAM:  Access Code: [email or web address] URL: [email or web address] Date: 03/15/2024 Prepared by: Venetia Endo  Exercises - Supine Shoulder Flexion AAROM with Dowel  - 2 x daily - 7 x weekly - 2 sets - 10 reps - Supine Shoulder External Rotation with Dowel  - 2 x daily - 7 x weekly - 2 sets - 10 reps - Seated Scapular Retraction  - 2 x daily - 7 x weekly - 2 sets - 10 reps - 3sec hold   ASSESSMENT:  CLINICAL IMPRESSION: Patient exhibits good tolerance of passive and AAROM up to current limits of post-operative protocol. We worked into new strategies for LEHMAN [employer]OTHERS for forward elevation with careful monitoring and instruction to ensure pt is able to maintain ROM within current protocol limits. Patient has intermittent mild discomfort with forward elevation, but overall low level of pain at this time post-operatively. Pt will continue to benefit from skilled PT services to address deficits and improve  function.   OBJECTIVE IMPAIRMENTS: decreased ROM, decreased strength, hypomobility, impaired flexibility, impaired UE functional use, postural dysfunction, and pain.   ACTIVITY LIMITATIONS: carrying,  lifting, transfers, bathing, dressing, self feeding, reach over head, and hygiene/grooming  PARTICIPATION LIMITATIONS: meal prep, cleaning, laundry, driving, yard work, and working in his shop  PERSONAL FACTORS: Age, Past/current experiences, and 3+ comorbidities: (chronic tobacco use, PVD, atherosclerosis of aorta, DM, depression/anxietty, HLD) are also affecting patient's functional outcome.   REHAB POTENTIAL: Good  CLINICAL DECISION MAKING: Stable/uncomplicated  EVALUATION COMPLEXITY: Low   GOALS: Goals reviewed with patient? Yes  SHORT TERM GOALS: Target date: 04/10/2024  Pt will be independent with HEP to improve strength and decrease shoulder pain to improve pain-free function at home and work. Baseline: 03/15/24: Pt continuing exercises from [facility] HEP printout (elbow AROM, wrist AROM, gripping); we updated HEP for shoulder AAROM in limited ROM and scap retractions.  Goal status: INITIAL   LONG TERM GOALS: Target date: 06/01/2024  Pt will have R shoulder active forward elevation to 130 deg as needed for functional ROM for reaching, self-care ADLs, and household tasks Baseline: 03/15/24: R shoulder flexion 47 deg Goal status: INITIAL  2.  Pt will decrease worst shoulder pain by at least 3 points on the NPRS in order to demonstrate clinically significant reduction in shoulder pain. Baseline: 03/15/24: 10/10 at worst.  Goal status: INITIAL  3.  Pt will decrease quick DASH score by at least 8% in order to demonstrate clinically significant reduction in disability related to shoulder pain        Baseline: 03/15/24: 81.8% Goal status: INITIAL  4. Pt will have MMT 4+/5 or greater for all tested shoulder muscles (deltoid, RTC, periscapular) in order to demonstrate improvement in  strength and function      Baseline: 03/15/24: MMT deferred due to early post-op status.  Goal status: INITIAL   PLAN: PT FREQUENCY: 1-2x/week  PT DURATION: 12 weeks  PLANNED INTERVENTIONS: Therapeutic exercises, Therapeutic activity, Neuromuscular re-education, Balance training, Gait training, Patient/Family education, Self Care, Joint mobilization, Joint manipulation, Vestibular training, Canalith repositioning, Orthotic/Fit training, DME instructions, Dry Needling, Electrical stimulation, Spinal manipulation, Spinal mobilization, Cryotherapy, Moist heat, Taping, Traction, Ultrasound, Ionotophoresis 4mg /ml Dexamethasone , Manual therapy, and Re-evaluation.  PLAN FOR NEXT SESSION: R shoulder PROM within limits of protocol, AA/AROM as tolerated for forward elevation and limited ER ROM, periscapular isometrics  Venetia Endo, PT, DPT #[id number]  Venetia ONEIDA Endo, PT 03/30/2024, 7:58 AM

## 2024-04-04 ENCOUNTER — Ambulatory Visit: Admitting: Physical Therapy

## 2024-04-04 ENCOUNTER — Encounter: Payer: Self-pay | Admitting: Physical Therapy

## 2024-04-04 DIAGNOSIS — Z96611 Presence of right artificial shoulder joint: Secondary | ICD-10-CM | POA: Diagnosis not present

## 2024-04-04 DIAGNOSIS — M6281 Muscle weakness (generalized): Secondary | ICD-10-CM

## 2024-04-04 DIAGNOSIS — M25611 Stiffness of right shoulder, not elsewhere classified: Secondary | ICD-10-CM

## 2024-04-04 NOTE — Therapy (Signed)
 OUTPATIENT PHYSICAL THERAPY SHOULDER POST-OP TREATMENT  Patient Name: Joel Flores MRN: [id number] DOB:November 08, 1950, 73 y.o., male Today's Date: 04/04/2024  END OF SESSION:  PT End of Session - 04/04/24 1515     Visit Number 6    Number of Visits 21    Date for Recertification  05/24/24    Authorization Type [employer]    PT Start Time 1515    PT Stop Time 1554    PT Time Calculation (min) 39 min    Equipment Utilized During Treatment --   post-op R shoulder sling   Activity Tolerance Patient tolerated treatment well    Behavior During Therapy WFL for tasks assessed/performed             Past Medical History:  Diagnosis Date   Allergy 1990   Anginal pain    Anxiety    PANIC ATTACKS   Arthritis    right shoulder   Atherosclerosis of aorta 02/12/2015   Atherosclerotic coronary vascular disease 2008   s/p right iliac stent   Cataract    COPD (chronic obstructive pulmonary disease) (HCC)    Cough    Depression    DM (diabetes mellitus) (HCC) 03/10/2023   Dyspnea    Environmental and seasonal allergies    Essential hypertension 02/12/2015   Family history of adverse reaction to anesthesia    Father - 51 - MI, then stroke after Prostate surgery   GERD (gastroesophageal reflux disease)    History of hiatal hernia    Hyperlipidemia    Hypertension    Hypothyroidism    Incisional hernia, without obstruction or gangrene    PVD (peripheral vascular disease) 11/17/2016   Venous insufficiency    Vertigo    can happen daily   Wears hearing aid in both ears    has, does not wear   Past Surgical History:  Procedure Laterality Date   CATARACT EXTRACTION W/PHACO Right 03/16/2018   Procedure: CATARACT EXTRACTION PHACO AND INTRAOCULAR LENS PLACEMENT (IOC);  Surgeon: Ferol Rogue, MD;  Location: [facility];  Service: Ophthalmology;  Laterality: Right;  US   01:05 CDE 11.85 Fluid pack lot # [id number]   CATARACT EXTRACTION W/PHACO Left 05/08/2020   Procedure: CATARACT  EXTRACTION PHACO AND INTRAOCULAR LENS PLACEMENT (IOC) LEFT 7.32 00:54.7 13.4%;  Surgeon: Mittie Gaskin, MD;  Location: [facility];  Service: Ophthalmology;  Laterality: Left;   CHOLECYSTECTOMY     COLONOSCOPY  2015   Dr Jinny- cleared for 5 years    COLONOSCOPY WITH PROPOFOL  N/A 01/17/2018   Procedure: COLONOSCOPY WITH PROPOFOL ;  Surgeon: Jinny Carmine, MD;  Location: [facility];  Service: Endoscopy;  Laterality: N/A;  requests early   HERNIA REPAIR  July 2019   INSERTION OF MESH N/A 08/31/2017   Procedure: INSERTION OF MESH;  Surgeon: Nicholaus Selinda Birmingham, MD;  Location: [facility];  Service: General;  Laterality: N/A;   IR CATHETER TUBE CHANGE  08/05/2023   IR RADIOLOGIST EVAL & MGMT  06/01/2023   IR RADIOLOGIST EVAL & MGMT  06/08/2023   IR RADIOLOGIST EVAL & MGMT  07/13/2023   IR RADIOLOGIST EVAL & MGMT  08/05/2023   IR REMOVAL OF CALCULI/DEBRIS BILIARY DUCT/GB  07/09/2023   IRRIGATION AND DEBRIDEMENT ABSCESS N/A 03/10/2023   Procedure: IRRIGATION AND DEBRIDEMENT ABDOMINAL WALL ABSCESS;  Surgeon: Jordis Laneta FALCON, MD;  Location: [facility];  Service: General;  Laterality: N/A;   POLYPECTOMY  01/17/2018   Procedure: POLYPECTOMY;  Surgeon: Jinny Carmine, MD;  Location: [facility];  Service: Endoscopy;;   UMBILICAL HERNIA REPAIR N/A 08/31/2017   Procedure: HERNIA REPAIR UMBILICAL ADULT;  Surgeon: Nicholaus Selinda Birmingham, MD;  Location: [facility];  Service: General;  Laterality: N/A;   VEIN SURGERY     1 stents in R) leg   Patient Active Problem List   Diagnosis Date Noted   Hepatic abscess 03/10/2023   Abdominal pain 03/10/2023   DM (diabetes mellitus) (HCC) 03/10/2023   Sepsis (HCC) 03/10/2023   Abdominal wall abscess 03/10/2023   Bilateral carotid artery stenosis 09/20/2019   Colon cancer screening    Polyp of sigmoid colon    SOBOE (shortness of breath on exertion) 07/30/2017   PVD (peripheral vascular disease) 11/17/2016   Tobacco use disorder 11/17/2016   Reactive  airway disease, mild intermittent, uncomplicated 08/12/2016   Allergic rhinitis due to pollen 08/13/2015   Nocturia 08/13/2015   Atherosclerosis of aorta 02/12/2015   Essential hypertension 02/12/2015   Esophageal reflux 02/12/2015   Depression 02/12/2015   Adult hypothyroidism 02/12/2015   Chronic anxiety 02/12/2015   Hyperlipemia 02/12/2015    PCP: Doretta Button, MD  REFERRING PROVIDER: Alease Oyster, MD  REFERRING DIAG: s/p R reverse total shoulder arthroplasty  RATIONALE FOR EVALUATION AND TREATMENT: Rehabilitation  THERAPY DIAG: S/P reverse total shoulder arthroplasty, right  Stiffness of right shoulder, not elsewhere classified  Muscle weakness (generalized)  ONSET DATE: 03/08/24, R rTSA  FOLLOW-UP APPT SCHEDULED WITH REFERRING PROVIDER: Yes ; 03/28/24  PERTINENT HISTORY: Pt is a 73 year old male s/p R rTSA (DOS: 03/08/24).  Pt currently reports some confusion regarding his HEP. Pt has completed given exercises from [facility], but did not understand relevance to his shoulder. Pt denies post-op complications. Patient reports some challenge with comfort at night. Pt is smoker and spends a lot of time in his shop to smoke and work on [employer] working.   PAIN:  Pain Intensity: Present: 2/10, Best: 2/10, Worst: 10/10 Pain location: Anterior arm pain Pain Quality: burning  Radiating: No  Numbness/Tingling: Yes; fleeting N/T in 4th-5th digits and thumb; this has subsided Focal Weakness: No Aggravating factors: inadvertent abduction/ER of R arm Relieving factors: None History of prior shoulder or neck/shoulder injury, pain, surgery, or therapy: Yes; chronic R shoulder pain/RTC arthropathy  Falls: Has patient fallen in last 6 months? No, Number of falls: N/A Dominant hand: right Imaging: Yes ;  - X-ray: good post-op follow-up  Red flags (personal history of cancer, chills/fever, night sweats, nausea, vomiting, unrelenting pain, unexplained weight gain/loss):  Negative  PRECAUTIONS: Other: s/p R rTSA  WEIGHT BEARING RESTRICTIONS: No  FALLS: Has patient fallen in last 6 months? 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)  Patient Goals: Able to move arm without pain    OBJECTIVE (data from initial evaluation unless otherwise dated):   Patient Surveys  QuickDASH: 81.8%  Posture Rounded shoulders, mild forward head  AROM AROM (Normal range in degrees) AROM 03/15/24   Right Left  Shoulder    Flexion 47 142  Extension    Abduction 48 168  External Rotation 15 66  Internal Rotation    Hands Behind Head    Hands Behind Back        Elbow    Flexion    Extension    Pronation    Supination    (* = pain; Blank rows =  not tested)  UE MMT: MMT deferred on initial evaluation due to post-op status* MMT (out of 5) Right Left       Shoulder   Flexion    Extension    Abduction    External rotation    Internal rotation    Horizontal abduction    Horizontal adduction    Lower Trapezius    Rhomboids        Elbow  Flexion    Extension    Pronation    Supination        Wrist  Flexion    Extension    Radial deviation    Ulnar deviation        MCP  Flexion    Extension    Abduction    Adduction    (* = pain; Blank rows = not tested)  Sensation Deferred  Reflexes Deferred  Palpation TTP R ACJ (1), R long head of biceps tendon (1), R lateral deltoid (1), R anterior deltoid (1) (Blank rows = not tested) Graded on 0-4 scale (0 = no pain, 1 = pain, 2 = pain with wincing/grimacing/flinching, 3 = pain with withdrawal, 4 = unwilling to allow palpation), (Blank rows = not tested)     TODAY'S TREATMENT    04/04/2024   SUBJECTIVE STATEMENT:    Patient reports notable soreness after last visit. He felt that table slides were  most challenging. He reports soreness up until today. He has minimal pain this afternoon. He reports working on table slides at home earlier today also.   Objective: Pain:  1/10   Therapeutic Exercise - for shoulder ROM as needed for reaching and self-care ADLs  ROM restrictions: Flexion 120 deg PROM Abd 75 deg PROM ER 40 deg PROM today  PROM R shoulder- flexion, abduction, ER (pt in supine position); x 8 minutes  Shoulder flexion AROM in supine; 2 x 10  Reclined on wedge 60 deg elevated at head; wand AAROM flexion; 1 x 10 Reclined on wedge 45 deg elevated at head; wand AAROM flexion; 1 x 10  Gear shift, SPC on floor for forward reach/forward elevation AAROM; x 20  Pulleys for forward elevation AAROM: with 5-10 second holds x 2 min   PATIENT EDUCATION: Reviewed HEP;  discussed current ROM restrictions and timeline for progression to phase III rehab per protocol.    *not today* Wand AAROM ER; 1 x 10 -reviewed and discussed allowed ROM per protocol   PATIENT EDUCATION:  Education details: see above for patient education details Person educated: Patient Education method: Explanation, Demonstration, and Handouts Education comprehension: verbalized understanding and returned demonstration   HOME EXERCISE PROGRAM:  Access Code: [email or web address] URL: [email or web address] Date: 03/15/2024 Prepared by: Venetia Endo  Exercises - Supine Shoulder Flexion AAROM with Dowel  - 2 x daily - 7 x weekly - 2 sets - 10 reps - Supine Shoulder External Rotation with Dowel  - 2 x daily - 7 x weekly - 2 sets - 10 reps - Seated Scapular Retraction  - 2 x daily - 7 x weekly - 2 sets - 10 reps - 3sec hold   ASSESSMENT:  CLINICAL IMPRESSION: Patient is able to achieve PROM up to current limits of protocol. He is still limited with active forward elevation against gravity. We worked on designer, multimedia progression for forward elevation AAROM, and pt has notable fatigue with this. Pt has  anterior shoulder soreness post-treatment, but he denies substantial pain and declines cold pack. Patient has intermittent mild discomfort  with forward elevation, but overall low level of pain at this time post-operatively. Pt will continue to benefit from skilled PT services to address deficits and improve function.   OBJECTIVE IMPAIRMENTS: decreased ROM, decreased strength, hypomobility, impaired flexibility, impaired UE functional use, postural dysfunction, and pain.   ACTIVITY LIMITATIONS: carrying, lifting, transfers, bathing, dressing, self feeding, reach over head, and hygiene/grooming  PARTICIPATION LIMITATIONS: meal prep, cleaning, laundry, driving, yard work, and working in his shop  PERSONAL FACTORS: Age, Past/current experiences, and 3+ comorbidities: (chronic tobacco use, PVD, atherosclerosis of aorta, DM, depression/anxietty, HLD) are also affecting patient's functional outcome.   REHAB POTENTIAL: Good  CLINICAL DECISION MAKING: Stable/uncomplicated  EVALUATION COMPLEXITY: Low   GOALS: Goals reviewed with patient? Yes  SHORT TERM GOALS: Target date: 04/10/2024  Pt will be independent with HEP to improve strength and decrease shoulder pain to improve pain-free function at home and work. Baseline: 03/15/24: Pt continuing exercises from [facility] HEP printout (elbow AROM, wrist AROM, gripping); we updated HEP for shoulder AAROM in limited ROM and scap retractions.  Goal status: INITIAL   LONG TERM GOALS: Target date: 06/01/2024  Pt will have R shoulder active forward elevation to 130 deg as needed for functional ROM for reaching, self-care ADLs, and household tasks Baseline: 03/15/24: R shoulder flexion 47 deg Goal status: INITIAL  2.  Pt will decrease worst shoulder pain by at least 3 points on the NPRS in order to demonstrate clinically significant reduction in shoulder pain. Baseline: 03/15/24: 10/10 at worst.  Goal status: INITIAL  3.  Pt will decrease quick DASH score by  at least 8% in order to demonstrate clinically significant reduction in disability related to shoulder pain        Baseline: 03/15/24: 81.8% Goal status: INITIAL  4. Pt will have MMT 4+/5 or greater for all tested shoulder muscles (deltoid, RTC, periscapular) in order to demonstrate improvement in strength and function      Baseline: 03/15/24: MMT deferred due to early post-op status.  Goal status: INITIAL   PLAN: PT FREQUENCY: 1-2x/week  PT DURATION: 12 weeks  PLANNED INTERVENTIONS: Therapeutic exercises, Therapeutic activity, Neuromuscular re-education, Balance training, Gait training, Patient/Family education, Self Care, Joint mobilization, Joint manipulation, Vestibular training, Canalith repositioning, Orthotic/Fit training, DME instructions, Dry Needling, Electrical stimulation, Spinal manipulation, Spinal mobilization, Cryotherapy, Moist heat, Taping, Traction, Ultrasound, Ionotophoresis 4mg /ml Dexamethasone , Manual therapy, and Re-evaluation.  PLAN FOR NEXT SESSION: R shoulder PROM within limits of protocol, AA/AROM as tolerated for forward elevation and limited ER ROM, periscapular isometrics  Venetia Endo, PT, DPT #[id number]  Venetia ONEIDA Endo, PT 04/04/2024, 3:15 PM

## 2024-04-06 ENCOUNTER — Ambulatory Visit: Admitting: Physical Therapy

## 2024-04-06 DIAGNOSIS — M25611 Stiffness of right shoulder, not elsewhere classified: Secondary | ICD-10-CM

## 2024-04-06 DIAGNOSIS — M6281 Muscle weakness (generalized): Secondary | ICD-10-CM

## 2024-04-06 DIAGNOSIS — Z96611 Presence of right artificial shoulder joint: Secondary | ICD-10-CM | POA: Diagnosis not present

## 2024-04-06 NOTE — Therapy (Signed)
 OUTPATIENT PHYSICAL THERAPY SHOULDER POST-OP TREATMENT  Patient Name: Joel Flores MRN: [id number] DOB:1950/06/22, 73 y.o., male Today's Date: 04/06/2024  END OF SESSION:  PT End of Session - 04/06/24 1344     Visit Number 7    Number of Visits 21    Date for Recertification  05/24/24    Authorization Type [employer]    PT Start Time 1345    PT Stop Time 1426    PT Time Calculation (min) 41 min    Equipment Utilized During Treatment --   post-op R shoulder sling   Activity Tolerance Patient tolerated treatment well    Behavior During Therapy WFL for tasks assessed/performed              Past Medical History:  Diagnosis Date   Allergy 1990   Anginal pain    Anxiety    PANIC ATTACKS   Arthritis    right shoulder   Atherosclerosis of aorta 02/12/2015   Atherosclerotic coronary vascular disease 2008   s/p right iliac stent   Cataract    COPD (chronic obstructive pulmonary disease) (HCC)    Cough    Depression    DM (diabetes mellitus) (HCC) 03/10/2023   Dyspnea    Environmental and seasonal allergies    Essential hypertension 02/12/2015   Family history of adverse reaction to anesthesia    Father - 69 - MI, then stroke after Prostate surgery   GERD (gastroesophageal reflux disease)    History of hiatal hernia    Hyperlipidemia    Hypertension    Hypothyroidism    Incisional hernia, without obstruction or gangrene    PVD (peripheral vascular disease) 11/17/2016   Venous insufficiency    Vertigo    can happen daily   Wears hearing aid in both ears    has, does not wear   Past Surgical History:  Procedure Laterality Date   CATARACT EXTRACTION W/PHACO Right 03/16/2018   Procedure: CATARACT EXTRACTION PHACO AND INTRAOCULAR LENS PLACEMENT (IOC);  Surgeon: Ferol Rogue, MD;  Location: [facility];  Service: Ophthalmology;  Laterality: Right;  US   01:05 CDE 11.85 Fluid pack lot # [id number]   CATARACT EXTRACTION W/PHACO Left 05/08/2020   Procedure: CATARACT  EXTRACTION PHACO AND INTRAOCULAR LENS PLACEMENT (IOC) LEFT 7.32 00:54.7 13.4%;  Surgeon: Mittie Gaskin, MD;  Location: [facility];  Service: Ophthalmology;  Laterality: Left;   CHOLECYSTECTOMY     COLONOSCOPY  2015   Dr Jinny- cleared for 5 years    COLONOSCOPY WITH PROPOFOL  N/A 01/17/2018   Procedure: COLONOSCOPY WITH PROPOFOL ;  Surgeon: Jinny Carmine, MD;  Location: [facility];  Service: Endoscopy;  Laterality: N/A;  requests early   HERNIA REPAIR  July 2019   INSERTION OF MESH N/A 08/31/2017   Procedure: INSERTION OF MESH;  Surgeon: Nicholaus Selinda Birmingham, MD;  Location: [facility];  Service: General;  Laterality: N/A;   IR CATHETER TUBE CHANGE  08/05/2023   IR RADIOLOGIST EVAL & MGMT  06/01/2023   IR RADIOLOGIST EVAL & MGMT  06/08/2023   IR RADIOLOGIST EVAL & MGMT  07/13/2023   IR RADIOLOGIST EVAL & MGMT  08/05/2023   IR REMOVAL OF CALCULI/DEBRIS BILIARY DUCT/GB  07/09/2023   IRRIGATION AND DEBRIDEMENT ABSCESS N/A 03/10/2023   Procedure: IRRIGATION AND DEBRIDEMENT ABDOMINAL WALL ABSCESS;  Surgeon: Jordis Laneta FALCON, MD;  Location: [facility];  Service: General;  Laterality: N/A;   POLYPECTOMY  01/17/2018   Procedure: POLYPECTOMY;  Surgeon: Jinny Carmine, MD;  Location: [facility];  Service: Endoscopy;;   UMBILICAL HERNIA REPAIR N/A 08/31/2017   Procedure: HERNIA REPAIR UMBILICAL ADULT;  Surgeon: Nicholaus Selinda Birmingham, MD;  Location: [facility];  Service: General;  Laterality: N/A;   VEIN SURGERY     1 stents in R) leg   Patient Active Problem List   Diagnosis Date Noted   Hepatic abscess 03/10/2023   Abdominal pain 03/10/2023   DM (diabetes mellitus) (HCC) 03/10/2023   Sepsis (HCC) 03/10/2023   Abdominal wall abscess 03/10/2023   Bilateral carotid artery stenosis 09/20/2019   Colon cancer screening    Polyp of sigmoid colon    SOBOE (shortness of breath on exertion) 07/30/2017   PVD (peripheral vascular disease) 11/17/2016   Tobacco use disorder 11/17/2016   Reactive  airway disease, mild intermittent, uncomplicated 08/12/2016   Allergic rhinitis due to pollen 08/13/2015   Nocturia 08/13/2015   Atherosclerosis of aorta 02/12/2015   Essential hypertension 02/12/2015   Esophageal reflux 02/12/2015   Depression 02/12/2015   Adult hypothyroidism 02/12/2015   Chronic anxiety 02/12/2015   Hyperlipemia 02/12/2015    PCP: Doretta Button, MD  REFERRING PROVIDER: Alease Oyster, MD  REFERRING DIAG: s/p R reverse total shoulder arthroplasty  RATIONALE FOR EVALUATION AND TREATMENT: Rehabilitation  THERAPY DIAG: S/P reverse total shoulder arthroplasty, right  Stiffness of right shoulder, not elsewhere classified  Muscle weakness (generalized)  ONSET DATE: 03/08/24, R rTSA  FOLLOW-UP APPT SCHEDULED WITH REFERRING PROVIDER: Yes ; 03/28/24  PERTINENT HISTORY: Pt is a 73 year old male s/p R rTSA (DOS: 03/08/24).  Pt currently reports some confusion regarding his HEP. Pt has completed given exercises from [facility], but did not understand relevance to his shoulder. Pt denies post-op complications. Patient reports some challenge with comfort at night. Pt is smoker and spends a lot of time in his shop to smoke and work on [employer] working.   PAIN:  Pain Intensity: Present: 2/10, Best: 2/10, Worst: 10/10 Pain location: Anterior arm pain Pain Quality: burning  Radiating: No  Numbness/Tingling: Yes; fleeting N/T in 4th-5th digits and thumb; this has subsided Focal Weakness: No Aggravating factors: inadvertent abduction/ER of R arm Relieving factors: None History of prior shoulder or neck/shoulder injury, pain, surgery, or therapy: Yes; chronic R shoulder pain/RTC arthropathy  Falls: Has patient fallen in last 6 months? No, Number of falls: N/A Dominant hand: right Imaging: Yes ;  - X-ray: good post-op follow-up  Red flags (personal history of cancer, chills/fever, night sweats, nausea, vomiting, unrelenting pain, unexplained weight gain/loss):  Negative  PRECAUTIONS: Other: s/p R rTSA  WEIGHT BEARING RESTRICTIONS: No  FALLS: Has patient fallen in last 6 months? 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)  Patient Goals: Able to move arm without pain    OBJECTIVE (data from initial evaluation unless otherwise dated):   Patient Surveys  QuickDASH: 81.8%  Posture Rounded shoulders, mild forward head  AROM AROM (Normal range in degrees) AROM 03/15/24   Right Left  Shoulder    Flexion 47 142  Extension    Abduction 48 168  External Rotation 15 66  Internal Rotation    Hands Behind Head    Hands Behind Back        Elbow    Flexion    Extension    Pronation    Supination    (* = pain; Blank rows =  not tested)  UE MMT: MMT deferred on initial evaluation due to post-op status* MMT (out of 5) Right Left       Shoulder   Flexion    Extension    Abduction    External rotation    Internal rotation    Horizontal abduction    Horizontal adduction    Lower Trapezius    Rhomboids        Elbow  Flexion    Extension    Pronation    Supination        Wrist  Flexion    Extension    Radial deviation    Ulnar deviation        MCP  Flexion    Extension    Abduction    Adduction    (* = pain; Blank rows = not tested)  Sensation Deferred  Reflexes Deferred  Palpation TTP R ACJ (1), R long head of biceps tendon (1), R lateral deltoid (1), R anterior deltoid (1) (Blank rows = not tested) Graded on 0-4 scale (0 = no pain, 1 = pain, 2 = pain with wincing/grimacing/flinching, 3 = pain with withdrawal, 4 = unwilling to allow palpation), (Blank rows = not tested)     TODAY'S TREATMENT    04/06/2024   SUBJECTIVE STATEMENT:    Patient reports some mild aching sensation in R shoulder at arrival to PT.  Patient reports 0/10 pain at rest, mild pain with active forward elevation up to 3/10. He expresses frustration with limited active forward elevation of R arm.   Objective: Pain:  0/10   Therapeutic Exercise - for shoulder ROM as needed for reaching and self-care ADLs  ROM restrictions: Flexion 120 deg PROM Abd 75 deg PROM ER 40 deg PROM today  PROM R shoulder- flexion, abduction, ER (pt in supine position); x 5 minutes  Shoulder flexion AROM in supine; 2 x 10 Reclined on wedge 45 deg elevated at head; shoulder flexion AROM; 1 x 10  Standing wand flexion AAROM; 1 x 10  -notable scapular elevation compensation Wand flexion AAROM, 60 deg elevated; 1 x 10  Pulleys for forward elevation AAROM: with 5-10 second holds x 2 min  Standing table slide, table raised and 30 deg incline; 2 x 10   Shoulder AROM: Flexion R 79   PATIENT EDUCATION: Reviewed HEP; discussed expectations during this phase of post-op rehab.    *not today* Wand AAROM ER; 1 x 10 -reviewed and discussed allowed ROM per protocol   PATIENT EDUCATION:  Education details: see above for patient education details Person educated: Patient Education method: Explanation, Demonstration, and Handouts Education comprehension: verbalized understanding and returned demonstration   HOME EXERCISE PROGRAM:  Access Code: [email or web address] URL: [email or web address] Date: 03/15/2024 Prepared by: Venetia Endo  Exercises - Supine Shoulder Flexion AAROM with Dowel  - 2 x daily - 7 x weekly - 2 sets - 10 reps - Supine Shoulder External Rotation with Dowel  - 2 x daily - 7 x weekly - 2 sets - 10 reps - Seated Scapular Retraction  - 2 x daily - 7 x weekly - 2 sets - 10 reps - 3sec hold   ASSESSMENT:  CLINICAL IMPRESSION: Patient exhibits improved capacity for reaching out to just below 90 deg shoulder elevation. With slight elbow flexion, pt is able to functionally reach to shoulder height. He has minimal pain  at this time at rest, but he does have mild discomfort with active forward elevation and notable fatigability of deltoid  with repeated active forward elevation Pt has remaining deficits with R shoulder AROM, functional UE strength, mild to moderate post-operative pain, and postural changes. Pt will continue to benefit from skilled PT services to address deficits and improve function.   OBJECTIVE IMPAIRMENTS: decreased ROM, decreased strength, hypomobility, impaired flexibility, impaired UE functional use, postural dysfunction, and pain.   ACTIVITY LIMITATIONS: carrying, lifting, transfers, bathing, dressing, self feeding, reach over head, and hygiene/grooming  PARTICIPATION LIMITATIONS: meal prep, cleaning, laundry, driving, yard work, and working in his shop  PERSONAL FACTORS: Age, Past/current experiences, and 3+ comorbidities: (chronic tobacco use, PVD, atherosclerosis of aorta, DM, depression/anxietty, HLD) are also affecting patient's functional outcome.   REHAB POTENTIAL: Good  CLINICAL DECISION MAKING: Stable/uncomplicated  EVALUATION COMPLEXITY: Low   GOALS: Goals reviewed with patient? Yes  SHORT TERM GOALS: Target date: 04/10/2024  Pt will be independent with HEP to improve strength and decrease shoulder pain to improve pain-free function at home and work. Baseline: 03/15/24: Pt continuing exercises from [facility] HEP printout (elbow AROM, wrist AROM, gripping); we updated HEP for shoulder AAROM in limited ROM and scap retractions.  Goal status: INITIAL   LONG TERM GOALS: Target date: 06/01/2024  Pt will have R shoulder active forward elevation to 130 deg as needed for functional ROM for reaching, self-care ADLs, and household tasks Baseline: 03/15/24: R shoulder flexion 47 deg Goal status: INITIAL  2.  Pt will decrease worst shoulder pain by at least 3 points on the NPRS in order to demonstrate clinically significant reduction in shoulder pain. Baseline: 03/15/24: 10/10 at  worst.  Goal status: INITIAL  3.  Pt will decrease quick DASH score by at least 8% in order to demonstrate clinically significant reduction in disability related to shoulder pain        Baseline: 03/15/24: 81.8% Goal status: INITIAL  4. Pt will have MMT 4+/5 or greater for all tested shoulder muscles (deltoid, RTC, periscapular) in order to demonstrate improvement in strength and function      Baseline: 03/15/24: MMT deferred due to early post-op status.  Goal status: INITIAL   PLAN: PT FREQUENCY: 1-2x/week  PT DURATION: 12 weeks  PLANNED INTERVENTIONS: Therapeutic exercises, Therapeutic activity, Neuromuscular re-education, Balance training, Gait training, Patient/Family education, Self Care, Joint mobilization, Joint manipulation, Vestibular training, Canalith repositioning, Orthotic/Fit training, DME instructions, Dry Needling, Electrical stimulation, Spinal manipulation, Spinal mobilization, Cryotherapy, Moist heat, Taping, Traction, Ultrasound, Ionotophoresis 4mg /ml Dexamethasone , Manual therapy, and Re-evaluation.  PLAN FOR NEXT SESSION: R shoulder PROM within limits of protocol, AA/AROM as tolerated for forward elevation and limited ER ROM, periscapular isometrics  Venetia Endo, PT, DPT #[id number]  Venetia ONEIDA Endo, PT 04/06/2024, 2:34 PM

## 2024-04-11 ENCOUNTER — Ambulatory Visit: Admitting: Physical Therapy

## 2024-04-11 ENCOUNTER — Encounter: Payer: Self-pay | Admitting: Physical Therapy

## 2024-04-11 DIAGNOSIS — M25611 Stiffness of right shoulder, not elsewhere classified: Secondary | ICD-10-CM

## 2024-04-11 DIAGNOSIS — Z96611 Presence of right artificial shoulder joint: Secondary | ICD-10-CM

## 2024-04-11 DIAGNOSIS — M6281 Muscle weakness (generalized): Secondary | ICD-10-CM

## 2024-04-11 NOTE — Therapy (Unsigned)
 OUTPATIENT PHYSICAL THERAPY SHOULDER POST-OP TREATMENT  Patient Name: Joel Flores MRN: [id number] DOB:02/10/1951, 73 y.o., male Today's Date: 04/11/2024  END OF SESSION:  PT End of Session - 04/11/24 1520     Visit Number 8    Number of Visits 21    Date for Recertification  05/24/24    Authorization Type [employer]    PT Start Time 1516    PT Stop Time 1556    PT Time Calculation (min) 40 min    Equipment Utilized During Treatment --   post-op R shoulder sling   Activity Tolerance Patient tolerated treatment well    Behavior During Therapy WFL for tasks assessed/performed          Past Medical History:  Diagnosis Date   Allergy 1990   Anginal pain    Anxiety    PANIC ATTACKS   Arthritis    right shoulder   Atherosclerosis of aorta 02/12/2015   Atherosclerotic coronary vascular disease 2008   s/p right iliac stent   Cataract    COPD (chronic obstructive pulmonary disease) (HCC)    Cough    Depression    DM (diabetes mellitus) (HCC) 03/10/2023   Dyspnea    Environmental and seasonal allergies    Essential hypertension 02/12/2015   Family history of adverse reaction to anesthesia    Father - 28 - MI, then stroke after Prostate surgery   GERD (gastroesophageal reflux disease)    History of hiatal hernia    Hyperlipidemia    Hypertension    Hypothyroidism    Incisional hernia, without obstruction or gangrene    PVD (peripheral vascular disease) 11/17/2016   Venous insufficiency    Vertigo    can happen daily   Wears hearing aid in both ears    has, does not wear   Past Surgical History:  Procedure Laterality Date   CATARACT EXTRACTION W/PHACO Right 03/16/2018   Procedure: CATARACT EXTRACTION PHACO AND INTRAOCULAR LENS PLACEMENT (IOC);  Surgeon: Ferol Rogue, MD;  Location: [facility];  Service: Ophthalmology;  Laterality: Right;  US   01:05 CDE 11.85 Fluid pack lot # [id number]   CATARACT EXTRACTION W/PHACO Left 05/08/2020   Procedure: CATARACT  EXTRACTION PHACO AND INTRAOCULAR LENS PLACEMENT (IOC) LEFT 7.32 00:54.7 13.4%;  Surgeon: Mittie Gaskin, MD;  Location: [facility];  Service: Ophthalmology;  Laterality: Left;   CHOLECYSTECTOMY     COLONOSCOPY  2015   Dr Jinny- cleared for 5 years    COLONOSCOPY WITH PROPOFOL  N/A 01/17/2018   Procedure: COLONOSCOPY WITH PROPOFOL ;  Surgeon: Jinny Carmine, MD;  Location: [facility];  Service: Endoscopy;  Laterality: N/A;  requests early   HERNIA REPAIR  July 2019   INSERTION OF MESH N/A 08/31/2017   Procedure: INSERTION OF MESH;  Surgeon: Nicholaus Selinda Birmingham, MD;  Location: [facility];  Service: General;  Laterality: N/A;   IR CATHETER TUBE CHANGE  08/05/2023   IR RADIOLOGIST EVAL & MGMT  06/01/2023   IR RADIOLOGIST EVAL & MGMT  06/08/2023   IR RADIOLOGIST EVAL & MGMT  07/13/2023   IR RADIOLOGIST EVAL & MGMT  08/05/2023   IR REMOVAL OF CALCULI/DEBRIS BILIARY DUCT/GB  07/09/2023   IRRIGATION AND DEBRIDEMENT ABSCESS N/A 03/10/2023   Procedure: IRRIGATION AND DEBRIDEMENT ABDOMINAL WALL ABSCESS;  Surgeon: Jordis Laneta FALCON, MD;  Location: [facility];  Service: General;  Laterality: N/A;   POLYPECTOMY  01/17/2018   Procedure: POLYPECTOMY;  Surgeon: Jinny Carmine, MD;  Location: [facility];  Service:  Endoscopy;;   UMBILICAL HERNIA REPAIR N/A 08/31/2017   Procedure: HERNIA REPAIR UMBILICAL ADULT;  Surgeon: Nicholaus Selinda Birmingham, MD;  Location: [facility];  Service: General;  Laterality: N/A;   VEIN SURGERY     1 stents in R) leg   Patient Active Problem List   Diagnosis Date Noted   Hepatic abscess 03/10/2023   Abdominal pain 03/10/2023   DM (diabetes mellitus) (HCC) 03/10/2023   Sepsis (HCC) 03/10/2023   Abdominal wall abscess 03/10/2023   Bilateral carotid artery stenosis 09/20/2019   Colon cancer screening    Polyp of sigmoid colon    SOBOE (shortness of breath on exertion) 07/30/2017   PVD (peripheral vascular disease) 11/17/2016   Tobacco use disorder 11/17/2016   Reactive  airway disease, mild intermittent, uncomplicated 08/12/2016   Allergic rhinitis due to pollen 08/13/2015   Nocturia 08/13/2015   Atherosclerosis of aorta 02/12/2015   Essential hypertension 02/12/2015   Esophageal reflux 02/12/2015   Depression 02/12/2015   Adult hypothyroidism 02/12/2015   Chronic anxiety 02/12/2015   Hyperlipemia 02/12/2015    PCP: Doretta Button, MD  REFERRING PROVIDER: Alease Oyster, MD  REFERRING DIAG: s/p R reverse total shoulder arthroplasty  RATIONALE FOR EVALUATION AND TREATMENT: Rehabilitation  THERAPY DIAG: S/P reverse total shoulder arthroplasty, right  Stiffness of right shoulder, not elsewhere classified  Muscle weakness (generalized)  ONSET DATE: 03/08/24, R rTSA  FOLLOW-UP APPT SCHEDULED WITH REFERRING PROVIDER: Yes ; 03/28/24  PERTINENT HISTORY: Pt is a 73 year old male s/p R rTSA (DOS: 03/08/24).  Pt currently reports some confusion regarding his HEP. Pt has completed given exercises from [facility], but did not understand relevance to his shoulder. Pt denies post-op complications. Patient reports some challenge with comfort at night. Pt is smoker and spends a lot of time in his shop to smoke and work on [employer] working.   PAIN:  Pain Intensity: Present: 2/10, Best: 2/10, Worst: 10/10 Pain location: Anterior arm pain Pain Quality: burning  Radiating: No  Numbness/Tingling: Yes; fleeting N/T in 4th-5th digits and thumb; this has subsided Focal Weakness: No Aggravating factors: inadvertent abduction/ER of R arm Relieving factors: None History of prior shoulder or neck/shoulder injury, pain, surgery, or therapy: Yes; chronic R shoulder pain/RTC arthropathy  Falls: Has patient fallen in last 6 months? No, Number of falls: N/A Dominant hand: right Imaging: Yes ;  - X-ray: good post-op follow-up  Red flags (personal history of cancer, chills/fever, night sweats, nausea, vomiting, unrelenting pain, unexplained weight gain/loss):  Negative  PRECAUTIONS: Other: s/p R rTSA  WEIGHT BEARING RESTRICTIONS: No  FALLS: Has patient fallen in last 6 months? 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)  Patient Goals: Able to move arm without pain    OBJECTIVE (data from initial evaluation unless otherwise dated):   Patient Surveys  QuickDASH: 81.8%  Posture Rounded shoulders, mild forward head  AROM AROM (Normal range in degrees) AROM 03/15/24   Right Left  Shoulder    Flexion 47 142  Extension    Abduction 48 168  External Rotation 15 66  Internal Rotation    Hands Behind Head    Hands Behind Back        Elbow    Flexion    Extension    Pronation    Supination    (* = pain; Blank rows = not tested)  UE MMT: MMT deferred on initial evaluation due to post-op status* MMT (out of 5) Right Left       Shoulder   Flexion    Extension    Abduction    External rotation    Internal rotation    Horizontal abduction    Horizontal adduction    Lower Trapezius    Rhomboids        Elbow  Flexion    Extension    Pronation    Supination        Wrist  Flexion    Extension    Radial deviation    Ulnar deviation        MCP  Flexion    Extension    Abduction    Adduction    (* = pain; Blank rows = not tested)  Sensation Deferred  Reflexes Deferred  Palpation TTP R ACJ (1), R long head of biceps tendon (1), R lateral deltoid (1), R anterior deltoid (1) (Blank rows = not tested) Graded on 0-4 scale (0 = no pain, 1 = pain, 2 = pain with wincing/grimacing/flinching, 3 = pain with withdrawal, 4 = unwilling to allow palpation), (Blank rows = not tested)     TODAY'S TREATMENT    04/11/2024   SUBJECTIVE STATEMENT:    Patient reports some mild aching sensation in R shoulder at arrival to PT.  Patient reports 0/10 pain at rest, mild pain with active forward elevation up to 3/10. He expresses frustration with limited active forward elevation of R arm.   Objective: Pain:  0/10   Therapeutic Exercise - for shoulder ROM as needed for reaching and self-care ADLs  ROM restrictions: Flexion 120 deg PROM Abd 75 deg PROM ER 40 deg PROM today  PROM R shoulder- flexion, abduction, ER (pt in supine position); x 5 minutes  -pt able to passively move up to current limits of protocol (Phase I)  Shoulder flexion AROM in supine; 1 x 10 Reclined on wedge 45 deg elevated at head; shoulder flexion AROM; 2 x 10  Wand flexion AAROM, 60 deg elevated; 2 x 10  Standing table slide with Silver physioball, incline 30 deg; x 20  Pulleys for forward elevation AAROM: with 5-10 second holds x 2 min   Shoulder AROM: Flexion R 80   PATIENT EDUCATION: Reviewed HEP; discussed expectations during this phase of post-op rehab.    *not today* Standing wand flexion AAROM; 1 x 10  -notable scapular elevation compensation Wand AAROM ER; 1 x 10 -reviewed and discussed allowed ROM per protocol   PATIENT EDUCATION:  Education details: see above for patient education details Person educated: Patient Education method: Explanation, Demonstration, and Handouts Education comprehension: verbalized understanding and returned demonstration   HOME EXERCISE PROGRAM:  Access Code: [email or web address] URL: [email or web address] Date: 03/15/2024 Prepared by: Venetia Endo  Exercises - Supine Shoulder Flexion AAROM with Dowel  - 2 x daily - 7 x weekly - 2 sets - 10 reps - Supine Shoulder External Rotation with Dowel  - 2 x daily - 7 x weekly - 2 sets - 10 reps - Seated Scapular Retraction  - 2 x daily - 7 x weekly - 2 sets - 10 reps - 3sec hold   ASSESSMENT:  CLINICAL IMPRESSION: Patient exhibits improved shoulder flexion AROM since eval, but he has remaining deltoid weakness and dec access to  available forward elevation necessitating further PT intervention. Pt has PROM up to current limits of protocol (Phase I). He will initiate phase II  on 04/19/24, and may then increase ROM as tolerated. Pt has remaining deficits with R shoulder AROM, functional UE strength, mild to moderate post-operative pain, and postural changes. Pt will continue to benefit from skilled PT services to address deficits and improve function.   OBJECTIVE IMPAIRMENTS: decreased ROM, decreased strength, hypomobility, impaired flexibility, impaired UE functional use, postural dysfunction, and pain.   ACTIVITY LIMITATIONS: carrying, lifting, transfers, bathing, dressing, self feeding, reach over head, and hygiene/grooming  PARTICIPATION LIMITATIONS: meal prep, cleaning, laundry, driving, yard work, and working in his shop  PERSONAL FACTORS: Age, Past/current experiences, and 3+ comorbidities: (chronic tobacco use, PVD, atherosclerosis of aorta, DM, depression/anxietty, HLD) are also affecting patient's functional outcome.   REHAB POTENTIAL: Good  CLINICAL DECISION MAKING: Stable/uncomplicated  EVALUATION COMPLEXITY: Low   GOALS: Goals reviewed with patient? Yes  SHORT TERM GOALS: Target date: 04/10/2024  Pt will be independent with HEP to improve strength and decrease shoulder pain to improve pain-free function at home and work. Baseline: 03/15/24: Pt continuing exercises from [facility] HEP printout (elbow AROM, wrist AROM, gripping); we updated HEP for shoulder AAROM in limited ROM and scap retractions.  Goal status: INITIAL   LONG TERM GOALS: Target date: 06/01/2024  Pt will have R shoulder active forward elevation to 130 deg as needed for functional ROM for reaching, self-care ADLs, and household tasks Baseline: 03/15/24: R shoulder flexion 47 deg Goal status: INITIAL  2.  Pt will decrease worst shoulder pain by at least 3 points on the NPRS in order to demonstrate clinically significant reduction in shoulder  pain. Baseline: 03/15/24: 10/10 at worst.  Goal status: INITIAL  3.  Pt will decrease quick DASH score by at least 8% in order to demonstrate clinically significant reduction in disability related to shoulder pain        Baseline: 03/15/24: 81.8% Goal status: INITIAL  4. Pt will have MMT 4+/5 or greater for all tested shoulder muscles (deltoid, RTC, periscapular) in order to demonstrate improvement in strength and function      Baseline: 03/15/24: MMT deferred due to early post-op status.  Goal status: INITIAL   PLAN: PT FREQUENCY: 1-2x/week  PT DURATION: 12 weeks  PLANNED INTERVENTIONS: Therapeutic exercises, Therapeutic activity, Neuromuscular re-education, Balance training, Gait training, Patient/Family education, Self Care, Joint mobilization, Joint manipulation, Vestibular training, Canalith repositioning, Orthotic/Fit training, DME instructions, Dry Needling, Electrical stimulation, Spinal manipulation, Spinal mobilization, Cryotherapy, Moist heat, Taping, Traction, Ultrasound, Ionotophoresis 4mg /ml Dexamethasone , Manual therapy, and Re-evaluation.  PLAN FOR NEXT SESSION: R shoulder PROM within limits of protocol, AA/AROM as tolerated for forward elevation and limited ER ROM, periscapular isometrics  Venetia Endo, PT, DPT #[id number]  Venetia ONEIDA Endo, PT 04/11/2024, 3:55 PM

## 2024-04-13 ENCOUNTER — Encounter: Payer: Self-pay | Admitting: Physical Therapy

## 2024-04-13 ENCOUNTER — Ambulatory Visit: Admitting: Physical Therapy

## 2024-04-13 DIAGNOSIS — Z96611 Presence of right artificial shoulder joint: Secondary | ICD-10-CM

## 2024-04-13 DIAGNOSIS — M6281 Muscle weakness (generalized): Secondary | ICD-10-CM

## 2024-04-13 DIAGNOSIS — M25611 Stiffness of right shoulder, not elsewhere classified: Secondary | ICD-10-CM

## 2024-04-13 NOTE — Therapy (Signed)
 OUTPATIENT PHYSICAL THERAPY SHOULDER POST-OP TREATMENT  Patient Name: Joel Flores MRN: [id number] DOB:1950/07/02, 73 y.o., male Today's Date: 04/13/2024  END OF SESSION:  PT End of Session - 04/13/24 1250     Visit Number 9    Number of Visits 21    Date for Recertification  05/24/24    Authorization Type [employer]    PT Start Time 1255    PT Stop Time 1335    PT Time Calculation (min) 40 min    Equipment Utilized During Treatment --   post-op R shoulder sling   Activity Tolerance Patient tolerated treatment well    Behavior During Therapy WFL for tasks assessed/performed          Past Medical History:  Diagnosis Date   Allergy 1990   Anginal pain    Anxiety    PANIC ATTACKS   Arthritis    right shoulder   Atherosclerosis of aorta 02/12/2015   Atherosclerotic coronary vascular disease 2008   s/p right iliac stent   Cataract    COPD (chronic obstructive pulmonary disease) (HCC)    Cough    Depression    DM (diabetes mellitus) (HCC) 03/10/2023   Dyspnea    Environmental and seasonal allergies    Essential hypertension 02/12/2015   Family history of adverse reaction to anesthesia    Father - 2 - MI, then stroke after Prostate surgery   GERD (gastroesophageal reflux disease)    History of hiatal hernia    Hyperlipidemia    Hypertension    Hypothyroidism    Incisional hernia, without obstruction or gangrene    PVD (peripheral vascular disease) 11/17/2016   Venous insufficiency    Vertigo    can happen daily   Wears hearing aid in both ears    has, does not wear   Past Surgical History:  Procedure Laterality Date   CATARACT EXTRACTION W/PHACO Right 03/16/2018   Procedure: CATARACT EXTRACTION PHACO AND INTRAOCULAR LENS PLACEMENT (IOC);  Surgeon: Ferol Rogue, MD;  Location: [facility];  Service: Ophthalmology;  Laterality: Right;  US   01:05 CDE 11.85 Fluid pack lot # [id number]   CATARACT EXTRACTION W/PHACO Left 05/08/2020   Procedure: CATARACT  EXTRACTION PHACO AND INTRAOCULAR LENS PLACEMENT (IOC) LEFT 7.32 00:54.7 13.4%;  Surgeon: Mittie Gaskin, MD;  Location: [facility];  Service: Ophthalmology;  Laterality: Left;   CHOLECYSTECTOMY     COLONOSCOPY  2015   Dr Jinny- cleared for 5 years    COLONOSCOPY WITH PROPOFOL  N/A 01/17/2018   Procedure: COLONOSCOPY WITH PROPOFOL ;  Surgeon: Jinny Carmine, MD;  Location: [facility];  Service: Endoscopy;  Laterality: N/A;  requests early   HERNIA REPAIR  July 2019   INSERTION OF MESH N/A 08/31/2017   Procedure: INSERTION OF MESH;  Surgeon: Nicholaus Selinda Birmingham, MD;  Location: [facility];  Service: General;  Laterality: N/A;   IR CATHETER TUBE CHANGE  08/05/2023   IR RADIOLOGIST EVAL & MGMT  06/01/2023   IR RADIOLOGIST EVAL & MGMT  06/08/2023   IR RADIOLOGIST EVAL & MGMT  07/13/2023   IR RADIOLOGIST EVAL & MGMT  08/05/2023   IR REMOVAL OF CALCULI/DEBRIS BILIARY DUCT/GB  07/09/2023   IRRIGATION AND DEBRIDEMENT ABSCESS N/A 03/10/2023   Procedure: IRRIGATION AND DEBRIDEMENT ABDOMINAL WALL ABSCESS;  Surgeon: Jordis Laneta FALCON, MD;  Location: [facility];  Service: General;  Laterality: N/A;   POLYPECTOMY  01/17/2018   Procedure: POLYPECTOMY;  Surgeon: Jinny Carmine, MD;  Location: [facility];  Service:  Endoscopy;;   UMBILICAL HERNIA REPAIR N/A 08/31/2017   Procedure: HERNIA REPAIR UMBILICAL ADULT;  Surgeon: Nicholaus Selinda Birmingham, MD;  Location: [facility];  Service: General;  Laterality: N/A;   VEIN SURGERY     1 stents in R) leg   Patient Active Problem List   Diagnosis Date Noted   Hepatic abscess 03/10/2023   Abdominal pain 03/10/2023   DM (diabetes mellitus) (HCC) 03/10/2023   Sepsis (HCC) 03/10/2023   Abdominal wall abscess 03/10/2023   Bilateral carotid artery stenosis 09/20/2019   Colon cancer screening    Polyp of sigmoid colon    SOBOE (shortness of breath on exertion) 07/30/2017   PVD (peripheral vascular disease) 11/17/2016   Tobacco use disorder 11/17/2016   Reactive  airway disease, mild intermittent, uncomplicated 08/12/2016   Allergic rhinitis due to pollen 08/13/2015   Nocturia 08/13/2015   Atherosclerosis of aorta 02/12/2015   Essential hypertension 02/12/2015   Esophageal reflux 02/12/2015   Depression 02/12/2015   Adult hypothyroidism 02/12/2015   Chronic anxiety 02/12/2015   Hyperlipemia 02/12/2015    PCP: Doretta Button, MD  REFERRING PROVIDER: Alease Oyster, MD  REFERRING DIAG: s/p R reverse total shoulder arthroplasty  RATIONALE FOR EVALUATION AND TREATMENT: Rehabilitation  THERAPY DIAG: No diagnosis found.  ONSET DATE: 03/08/24, R rTSA  FOLLOW-UP APPT SCHEDULED WITH REFERRING PROVIDER: Yes ; 03/28/24  PERTINENT HISTORY: Pt is a 73 year old male s/p R rTSA (DOS: 03/08/24).  Pt currently reports some confusion regarding his HEP. Pt has completed given exercises from [facility], but did not understand relevance to his shoulder. Pt denies post-op complications. Patient reports some challenge with comfort at night. Pt is smoker and spends a lot of time in his shop to smoke and work on [employer] working.   PAIN:  Pain Intensity: Present: 2/10, Best: 2/10, Worst: 10/10 Pain location: Anterior arm pain Pain Quality: burning  Radiating: No  Numbness/Tingling: Yes; fleeting N/T in 4th-5th digits and thumb; this has subsided Focal Weakness: No Aggravating factors: inadvertent abduction/ER of R arm Relieving factors: None History of prior shoulder or neck/shoulder injury, pain, surgery, or therapy: Yes; chronic R shoulder pain/RTC arthropathy  Falls: Has patient fallen in last 6 months? No, Number of falls: N/A Dominant hand: right Imaging: Yes ;  - X-ray: good post-op follow-up  Red flags (personal history of cancer, chills/fever, night sweats, nausea, vomiting, unrelenting pain, unexplained weight gain/loss): Negative  PRECAUTIONS: Other: s/p R rTSA  WEIGHT BEARING RESTRICTIONS: No  FALLS: Has patient fallen in last 6 months?  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)  Patient Goals: Able to move arm without pain    OBJECTIVE (data from initial evaluation unless otherwise dated):   Patient Surveys  QuickDASH: 81.8%  Posture Rounded shoulders, mild forward head  AROM AROM (Normal range in degrees) AROM 03/15/24   Right Left  Shoulder    Flexion 47 142  Extension    Abduction 48 168  External Rotation 15 66  Internal Rotation    Hands Behind Head    Hands Behind Back        Elbow    Flexion    Extension    Pronation    Supination    (* = pain; Blank rows = not tested)  UE MMT: MMT deferred on initial evaluation due to post-op status* MMT (out of 5)  Right Left       Shoulder   Flexion    Extension    Abduction    External rotation    Internal rotation    Horizontal abduction    Horizontal adduction    Lower Trapezius    Rhomboids        Elbow  Flexion    Extension    Pronation    Supination        Wrist  Flexion    Extension    Radial deviation    Ulnar deviation        MCP  Flexion    Extension    Abduction    Adduction    (* = pain; Blank rows = not tested)  Sensation Deferred  Reflexes Deferred  Palpation TTP R ACJ (1), R long head of biceps tendon (1), R lateral deltoid (1), R anterior deltoid (1) (Blank rows = not tested) Graded on 0-4 scale (0 = no pain, 1 = pain, 2 = pain with wincing/grimacing/flinching, 3 = pain with withdrawal, 4 = unwilling to allow palpation), (Blank rows = not tested)     TODAY'S TREATMENT    04/13/2024   SUBJECTIVE STATEMENT:    Patient reports notable soreness when waking up yesterday and still noticing it some today along anterior deltoid near proximal biceps muscle belly and deltoid insertion. Patient reports working on reclined  A/AAROM at home.   Objective: Pain:  3/10, discomfort and tightness at arrival to PT   Therapeutic Exercise - for shoulder ROM as needed for reaching and self-care ADLs  ROM restrictions: Flexion 120 deg PROM Abd 75 deg PROM ER 40 deg PROM today  PROM R shoulder- flexion, abduction, ER (pt in supine position); x 5 minutes  -pt able to passively move up to current limits of protocol (Phase I)  Shoulder flexion AROM in supine; 1 x 10 Reclined on wedge 30 deg elevated at head; shoulder flexion AROM; 1 x 10  Wand flexion AAROM, 30 deg elevated; 2 x 10   Cold pack (unbilled) - for anti-inflammatory and analgesic effect as needed for reduced pain and improved ability to participate in active PT intervention, along R shoulder in reclined position, x 5 minutes   Standing table slide with Silver physioball, incline 30 deg; x 20  Pulleys for forward elevation AAROM: with 5-10 second holds x 2 min    PATIENT EDUCATION: Discussed at length anatomy involved with rTSA and expectations moving forward with post-operative rehab. Discussed strategies for backing down intensity of A/AAROM work prn and use of cryotherapy prn.    *not today* Standing wand flexion AAROM; 1 x 10  -notable scapular elevation compensation Wand AAROM ER; 1 x 10 -reviewed and discussed allowed ROM per protocol   PATIENT EDUCATION:  Education details: see above for patient education details Person educated: Patient Education method: Explanation, Demonstration, and Handouts Education comprehension: verbalized understanding and returned demonstration   HOME EXERCISE PROGRAM:  Access Code: [email or web address] URL: [email or web address] Date: 03/15/2024 Prepared by: Venetia Endo  Exercises - Supine Shoulder Flexion AAROM with Dowel  - 2 x daily - 7 x weekly - 2 sets - 10 reps - Supine Shoulder External Rotation with Dowel  - 2 x daily - 7 x weekly - 2 sets - 10 reps - Seated Scapular Retraction  - 2  x daily - 7 x weekly - 2 sets - 10 reps - 3sec hold   ASSESSMENT:  CLINICAL IMPRESSION: Patient is just over 5 weeks post-op  and is able to attain passive motion up to limits of Phase I on protocol. He had notable soreness after progression of reclined A/AAROM last visit - we decreased angle of recline and decreased volume today with good tolerance in-session. Ice pack was utilied for hypoalgesic effect. Pt tolerates session relatively well with mild soreness noted following physioball table slide. Pt has remaining deficits with R shoulder AROM, functional UE strength, mild to moderate post-operative pain, and postural changes. Pt will continue to benefit from skilled PT services to address deficits and improve function.   OBJECTIVE IMPAIRMENTS: decreased ROM, decreased strength, hypomobility, impaired flexibility, impaired UE functional use, postural dysfunction, and pain.   ACTIVITY LIMITATIONS: carrying, lifting, transfers, bathing, dressing, self feeding, reach over head, and hygiene/grooming  PARTICIPATION LIMITATIONS: meal prep, cleaning, laundry, driving, yard work, and working in his shop  PERSONAL FACTORS: Age, Past/current experiences, and 3+ comorbidities: (chronic tobacco use, PVD, atherosclerosis of aorta, DM, depression/anxietty, HLD) are also affecting patient's functional outcome.   REHAB POTENTIAL: Good  CLINICAL DECISION MAKING: Stable/uncomplicated  EVALUATION COMPLEXITY: Low   GOALS: Goals reviewed with patient? Yes  SHORT TERM GOALS: Target date: 04/10/2024  Pt will be independent with HEP to improve strength and decrease shoulder pain to improve pain-free function at home and work. Baseline: 03/15/24: Pt continuing exercises from [facility] HEP printout (elbow AROM, wrist AROM, gripping); we updated HEP for shoulder AAROM in limited ROM and scap retractions.  Goal status: INITIAL   LONG TERM GOALS: Target date: 06/01/2024  Pt will have R shoulder active forward  elevation to 130 deg as needed for functional ROM for reaching, self-care ADLs, and household tasks Baseline: 03/15/24: R shoulder flexion 47 deg Goal status: INITIAL  2.  Pt will decrease worst shoulder pain by at least 3 points on the NPRS in order to demonstrate clinically significant reduction in shoulder pain. Baseline: 03/15/24: 10/10 at worst.  Goal status: INITIAL  3.  Pt will decrease quick DASH score by at least 8% in order to demonstrate clinically significant reduction in disability related to shoulder pain        Baseline: 03/15/24: 81.8% Goal status: INITIAL  4. Pt will have MMT 4+/5 or greater for all tested shoulder muscles (deltoid, RTC, periscapular) in order to demonstrate improvement in strength and function      Baseline: 03/15/24: MMT deferred due to early post-op status.  Goal status: INITIAL   PLAN: PT FREQUENCY: 1-2x/week  PT DURATION: 12 weeks  PLANNED INTERVENTIONS: Therapeutic exercises, Therapeutic activity, Neuromuscular re-education, Balance training, Gait training, Patient/Family education, Self Care, Joint mobilization, Joint manipulation, Vestibular training, Canalith repositioning, Orthotic/Fit training, DME instructions, Dry Needling, Electrical stimulation, Spinal manipulation, Spinal mobilization, Cryotherapy, Moist heat, Taping, Traction, Ultrasound, Ionotophoresis 4mg /ml Dexamethasone , Manual therapy, and Re-evaluation.  PLAN FOR NEXT SESSION: R shoulder PROM within limits of protocol, AA/AROM as tolerated for forward elevation and limited ER ROM, periscapular isometrics  Venetia Endo, PT, DPT #[id number]  Venetia ONEIDA Endo, PT 04/13/2024, 1:44 PM

## 2024-04-17 NOTE — Therapy (Unsigned)
 " OUTPATIENT PHYSICAL THERAPY TREATMENT AND PROGRESS NOTE   Dates of reporting period  03/15/24   to   04/18/24   Patient Name: Joel Flores MRN: [id number] DOB:11-26-1950, 73 y.o., male Today's Date: 04/18/2024  END OF SESSION:  PT End of Session - 04/18/24 1141     Visit Number 10    Number of Visits 21    Date for Recertification  05/24/24    Authorization Type [employer]    PT Start Time 1126    PT Stop Time 1208    PT Time Calculation (min) 42 min    Equipment Utilized During Treatment --   post-op R shoulder sling   Activity Tolerance Patient tolerated treatment well    Behavior During Therapy WFL for tasks assessed/performed           Past Medical History:  Diagnosis Date   Allergy 1990   Anginal pain    Anxiety    PANIC ATTACKS   Arthritis    right shoulder   Atherosclerosis of aorta 02/12/2015   Atherosclerotic coronary vascular disease 2008   s/p right iliac stent   Cataract    COPD (chronic obstructive pulmonary disease) (HCC)    Cough    Depression    DM (diabetes mellitus) (HCC) 03/10/2023   Dyspnea    Environmental and seasonal allergies    Essential hypertension 02/12/2015   Family history of adverse reaction to anesthesia    Father - 12 - MI, then stroke after Prostate surgery   GERD (gastroesophageal reflux disease)    History of hiatal hernia    Hyperlipidemia    Hypertension    Hypothyroidism    Incisional hernia, without obstruction or gangrene    PVD (peripheral vascular disease) 11/17/2016   Venous insufficiency    Vertigo    can happen daily   Wears hearing aid in both ears    has, does not wear   Past Surgical History:  Procedure Laterality Date   CATARACT EXTRACTION W/PHACO Right 03/16/2018   Procedure: CATARACT EXTRACTION PHACO AND INTRAOCULAR LENS PLACEMENT (IOC);  Surgeon: Ferol Rogue, MD;  Location: [facility];  Service: Ophthalmology;  Laterality: Right;  US   01:05 CDE 11.85 Fluid pack lot # [id number]   CATARACT  EXTRACTION W/PHACO Left 05/08/2020   Procedure: CATARACT EXTRACTION PHACO AND INTRAOCULAR LENS PLACEMENT (IOC) LEFT 7.32 00:54.7 13.4%;  Surgeon: Mittie Gaskin, MD;  Location: [facility];  Service: Ophthalmology;  Laterality: Left;   CHOLECYSTECTOMY     COLONOSCOPY  2015   Dr Jinny- cleared for 5 years    COLONOSCOPY WITH PROPOFOL  N/A 01/17/2018   Procedure: COLONOSCOPY WITH PROPOFOL ;  Surgeon: Jinny Carmine, MD;  Location: [facility];  Service: Endoscopy;  Laterality: N/A;  requests early   HERNIA REPAIR  July 2019   INSERTION OF MESH N/A 08/31/2017   Procedure: INSERTION OF MESH;  Surgeon: Nicholaus Selinda Birmingham, MD;  Location: [facility];  Service: General;  Laterality: N/A;   IR CATHETER TUBE CHANGE  08/05/2023   IR RADIOLOGIST EVAL & MGMT  06/01/2023   IR RADIOLOGIST EVAL & MGMT  06/08/2023   IR RADIOLOGIST EVAL & MGMT  07/13/2023   IR RADIOLOGIST EVAL & MGMT  08/05/2023   IR REMOVAL OF CALCULI/DEBRIS BILIARY DUCT/GB  07/09/2023   IRRIGATION AND DEBRIDEMENT ABSCESS N/A 03/10/2023   Procedure: IRRIGATION AND DEBRIDEMENT ABDOMINAL WALL ABSCESS;  Surgeon: Jordis Laneta FALCON, MD;  Location: [facility];  Service: General;  Laterality: N/A;   POLYPECTOMY  01/17/2018   Procedure: POLYPECTOMY;  Surgeon: Jinny Carmine, MD;  Location: [facility];  Service: Endoscopy;;   UMBILICAL HERNIA REPAIR N/A 08/31/2017   Procedure: HERNIA REPAIR UMBILICAL ADULT;  Surgeon: Nicholaus Selinda Birmingham, MD;  Location: [facility];  Service: General;  Laterality: N/A;   VEIN SURGERY     1 stents in R) leg   Patient Active Problem List   Diagnosis Date Noted   Hepatic abscess 03/10/2023   Abdominal pain 03/10/2023   DM (diabetes mellitus) (HCC) 03/10/2023   Sepsis (HCC) 03/10/2023   Abdominal wall abscess 03/10/2023   Bilateral carotid artery stenosis 09/20/2019   Colon cancer screening    Polyp of sigmoid colon    SOBOE (shortness of breath on exertion) 07/30/2017   PVD (peripheral vascular disease)  11/17/2016   Tobacco use disorder 11/17/2016   Reactive airway disease, mild intermittent, uncomplicated 08/12/2016   Allergic rhinitis due to pollen 08/13/2015   Nocturia 08/13/2015   Atherosclerosis of aorta 02/12/2015   Essential hypertension 02/12/2015   Esophageal reflux 02/12/2015   Depression 02/12/2015   Adult hypothyroidism 02/12/2015   Chronic anxiety 02/12/2015   Hyperlipemia 02/12/2015    PCP: Doretta Button, MD  REFERRING PROVIDER: Alease Oyster, MD  REFERRING DIAG: s/p R reverse total shoulder arthroplasty  RATIONALE FOR EVALUATION AND TREATMENT: Rehabilitation  THERAPY DIAG: S/P reverse total shoulder arthroplasty, right  Stiffness of right shoulder, not elsewhere classified  Muscle weakness (generalized)  ONSET DATE: 03/08/24, R rTSA  FOLLOW-UP APPT SCHEDULED WITH REFERRING PROVIDER: Yes ; 03/28/24  PERTINENT HISTORY: Pt is a 73 year old male s/p R rTSA (DOS: 03/08/24).  Pt currently reports some confusion regarding his HEP. Pt has completed given exercises from [facility], but did not understand relevance to his shoulder. Pt denies post-op complications. Patient reports some challenge with comfort at night. Pt is smoker and spends a lot of time in his shop to smoke and work on [employer] working.   PAIN:  Pain Intensity: Present: 2/10, Best: 2/10, Worst: 10/10 Pain location: Anterior arm pain Pain Quality: burning  Radiating: No  Numbness/Tingling: Yes; fleeting N/T in 4th-5th digits and thumb; this has subsided Focal Weakness: No Aggravating factors: inadvertent abduction/ER of R arm Relieving factors: None History of prior shoulder or neck/shoulder injury, pain, surgery, or therapy: Yes; chronic R shoulder pain/RTC arthropathy  Falls: Has patient fallen in last 6 months? No, Number of falls: N/A Dominant hand: right Imaging: Yes ;  - X-ray: good post-op follow-up  Red flags (personal history of cancer, chills/fever, night sweats, nausea, vomiting,  unrelenting pain, unexplained weight gain/loss): Negative  PRECAUTIONS: Other: s/p R rTSA  WEIGHT BEARING RESTRICTIONS: No  FALLS: Has patient fallen in last 6 months? 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)  Patient Goals: Able to move arm without pain    OBJECTIVE (data from initial evaluation unless otherwise dated):   Patient Surveys  QuickDASH: 81.8%  Posture Rounded shoulders, mild forward head  AROM AROM (Normal range in degrees) AROM 03/15/24 AROM 04/18/24   Right Left Right Left  Shoulder      Flexion 47 142 87   Extension      Abduction 48 168 75   External Rotation 15 66 30   Internal Rotation      Hands Behind Head      Hands Behind Back  Elbow      Flexion      Extension      Pronation      Supination      (* = pain; Blank rows = not tested)  UE MMT: MMT deferred on initial evaluation due to post-op status* MMT (out of 5) Right Left       Shoulder   Flexion    Extension    Abduction    External rotation    Internal rotation    Horizontal abduction    Horizontal adduction    Lower Trapezius    Rhomboids        Elbow  Flexion    Extension    Pronation    Supination        Wrist  Flexion    Extension    Radial deviation    Ulnar deviation        MCP  Flexion    Extension    Abduction    Adduction    (* = pain; Blank rows = not tested)  Sensation Deferred  Reflexes Deferred  Palpation TTP R ACJ (1), R long head of biceps tendon (1), R lateral deltoid (1), R anterior deltoid (1) (Blank rows = not tested) Graded on 0-4 scale (0 = no pain, 1 = pain, 2 = pain with wincing/grimacing/flinching, 3 = pain with withdrawal, 4 = unwilling to allow palpation), (Blank rows = not tested)     TODAY'S TREATMENT     04/18/2024   SUBJECTIVE STATEMENT:    Patient reports some frustration with ongoing shoulder elevation weakness and difficulty with functional reaching work at home. He feels that he will need to start using arm more normally without limiting activity as much for return to prior level. Pt works on shoulder ROM at home, but he does not necessarily complete exact home program as listed. He has worked on supine and recline AROM/AAROM with wand at home. He reports generally mild pain, but he does have significant upper arm soreness after PT and AROM work.    *GOAL UPDATE PERFORMED   Therapeutic Exercise - for shoulder ROM as needed for reaching and self-care ADLs  ROM restrictions: Flexion 120 deg PROM Abd 75 deg PROM ER 40 deg PROM today  PROM R shoulder- flexion, abduction, ER (pt in supine position); x 5 minutes  -pt able to passively move up to current limits of protocol (Phase I)  Shoulder flexion AROM in 30-deg reclined position; 1 x 15 Reclined on wedge 45 deg elevated at head; shoulder flexion AROM; 1 x 10  Wand flexion AAROM, 60 deg elevated; 2 x 10  Pulleys for forward elevation AAROM: with 5-10 second holds x 2 min  Standing table slide at wall; x 5  -significant challenge and rapid fatigue Wall slide on table incline, ~60 deg incline; x 10 with towel   PATIENT EDUCATION: Discussed current PT progress, prognosis, POC.    *not today* Cold pack (unbilled) - for anti-inflammatory and analgesic effect as needed for reduced pain and improved ability to participate in active PT intervention, along R shoulder in reclined position, x 5 minutes Standing wand flexion AAROM; 1 x 10  -notable scapular elevation compensation Wand AAROM ER; 1 x 10 -reviewed and discussed allowed ROM per protocol   PATIENT EDUCATION:  Education details: see above for patient education details Person educated: Patient Education method: Explanation, Demonstration, and Handouts Education  comprehension: verbalized understanding and returned demonstration   HOME EXERCISE PROGRAM:  Access Code:  [email or web address] URL: [email or web address] Date: 03/15/2024 Prepared by: Venetia Endo  Exercises - Supine Shoulder Flexion AAROM with Dowel  - 2 x daily - 7 x weekly - 2 sets - 10 reps - Supine Shoulder External Rotation with Dowel  - 2 x daily - 7 x weekly - 2 sets - 10 reps - Seated Scapular Retraction  - 2 x daily - 7 x weekly - 2 sets - 10 reps - 3sec hold   ASSESSMENT:  CLINICAL IMPRESSION: Patient still has challenge with active forward elevation of R shoulder; his AROM has significantly improved compared to initial evaluation, and he fortunately has generally low to moderate pain versus severe pain intermittently experienced in acute to sub-acute post-op phases. Patient has expressed some frustration related to ongoing weakness with forward reaching/forward flexion associated with remaining deltoid weakness and limited AROM. Pt met QuickDASH goal to demonstrate clinically meaningful change; goal was updated to score <25% disability. Pt is not yet ready for strength testing at this time post-operatively. Pt has remaining deficits with R shoulder AROM, functional UE strength, mild to moderate post-operative pain, and postural changes. Pt will continue to benefit from skilled PT services to address deficits and improve function.   OBJECTIVE IMPAIRMENTS: decreased ROM, decreased strength, hypomobility, impaired flexibility, impaired UE functional use, postural dysfunction, and pain.   ACTIVITY LIMITATIONS: carrying, lifting, transfers, bathing, dressing, self feeding, reach over head, and hygiene/grooming  PARTICIPATION LIMITATIONS: meal prep, cleaning, laundry, driving, yard work, and working in his shop  PERSONAL FACTORS: Age, Past/current experiences, and 3+ comorbidities: (chronic tobacco use, PVD, atherosclerosis of aorta, DM, depression/anxietty, HLD) are also  affecting patient's functional outcome.   REHAB POTENTIAL: Good  CLINICAL DECISION MAKING: Stable/uncomplicated  EVALUATION COMPLEXITY: Low   GOALS: Goals reviewed with patient? Yes  SHORT TERM GOALS: Target date: 04/10/2024  Pt will be independent with HEP to improve strength and decrease shoulder pain to improve pain-free function at home and work. Baseline: 03/15/24: Pt continuing exercises from [facility] HEP printout (elbow AROM, wrist AROM, gripping); we updated HEP for shoulder AAROM in limited ROM and scap retractions.     04/18/24: Pt reports regular work on shoulder AROM/AAROM at home, sometimes not with specifics discussed in home program.  Goal status: ON-GOING   LONG TERM GOALS: Target date: 06/01/2024  Pt will have R shoulder active forward elevation to 130 deg as needed for functional ROM for reaching, self-care ADLs, and household tasks Baseline: 03/15/24: R shoulder flexion 47 deg    04/18/24: R shoulder flexion AROM 87 deg Goal status: IN PROGRESS   2.  Pt will decrease worst shoulder pain by at least 3 points on the NPRS in order to demonstrate clinically significant reduction in shoulder pain. Baseline: 03/15/24: 10/10 at worst.     04/18/24: 3/10 at worst.  Goal status: ACHIEVED  3.  Pt will decrease quick DASH score by at least 8% in order to demonstrate clinically significant reduction in disability related to shoulder pain        Baseline: 03/15/24: 81.8%.    04/18/24: 52.3% Goal status: ACHIEVED  4. Pt will have MMT 4+/5 or greater for all tested shoulder muscles (deltoid, RTC, periscapular) in order to demonstrate improvement in strength and function      Baseline: 03/15/24: MMT deferred due to early post-op status.     04/18/24: Deferred until Phase II rehab.  Goal status: DEFERRED  5.  Pt will decrease quick DASH score to 25% disability or less in order to demonstrate clinically  significant reduction in disability related to shoulder pain        Baseline:  04/18/24: 52.3% Goal status: INITIAL   PLAN: PT FREQUENCY: 1-2x/week  PT DURATION: 6 weeks  PLANNED INTERVENTIONS: Therapeutic exercises, Therapeutic activity, Neuromuscular re-education, Balance training, Gait training, Patient/Family education, Self Care, Joint mobilization, Joint manipulation, Vestibular training, Canalith repositioning, Orthotic/Fit training, DME instructions, Dry Needling, Electrical stimulation, Spinal manipulation, Spinal mobilization, Cryotherapy, Moist heat, Taping, Traction, Ultrasound, Ionotophoresis 4mg /ml Dexamethasone , Manual therapy, and Re-evaluation.  PLAN FOR NEXT SESSION: Continue with Phase II rehab; progress ROM as tolerated (A/AAROM), initiate A/AAROM for IR and extension as tolerated. Progress with light resisted forward flexion, ER, and abduction as tolerated; NO resisted internal rotation/extension/scap retraction.    Venetia Endo, PT, DPT #[id number]  Venetia ONEIDA Endo, PT 04/18/2024, 12:19 PM  "

## 2024-04-18 ENCOUNTER — Encounter: Payer: Self-pay | Admitting: Physical Therapy

## 2024-04-18 ENCOUNTER — Ambulatory Visit: Admitting: Physical Therapy

## 2024-04-18 DIAGNOSIS — M6281 Muscle weakness (generalized): Secondary | ICD-10-CM

## 2024-04-18 DIAGNOSIS — M25611 Stiffness of right shoulder, not elsewhere classified: Secondary | ICD-10-CM

## 2024-04-18 DIAGNOSIS — Z96611 Presence of right artificial shoulder joint: Secondary | ICD-10-CM

## 2024-04-24 ENCOUNTER — Ambulatory Visit: Admitting: Physical Therapy

## 2024-04-24 ENCOUNTER — Telehealth: Payer: Self-pay | Admitting: Physical Therapy

## 2024-04-24 NOTE — Telephone Encounter (Signed)
 Called pt regarding missed appointment. Pt thought his appointment was Tuesday versus Monday. Pt states he will return for his scheduled appointment on Wednesday 04/26/24.

## 2024-04-24 NOTE — Therapy (Deleted)
 " OUTPATIENT PHYSICAL THERAPY TREATMENT   Patient Name: Joel Flores MRN: [id number] DOB:1950-07-12, 73 y.o., male Today's Date: 04/24/2024  END OF SESSION:     Past Medical History:  Diagnosis Date   Allergy 1990   Anginal pain    Anxiety    PANIC ATTACKS   Arthritis    right shoulder   Atherosclerosis of aorta 02/12/2015   Atherosclerotic coronary vascular disease 2008   s/p right iliac stent   Cataract    COPD (chronic obstructive pulmonary disease) (HCC)    Cough    Depression    DM (diabetes mellitus) (HCC) 03/10/2023   Dyspnea    Environmental and seasonal allergies    Essential hypertension 02/12/2015   Family history of adverse reaction to anesthesia    Father - 1991 - MI, then stroke after Prostate surgery   GERD (gastroesophageal reflux disease)    History of hiatal hernia    Hyperlipidemia    Hypertension    Hypothyroidism    Incisional hernia, without obstruction or gangrene    PVD (peripheral vascular disease) 11/17/2016   Venous insufficiency    Vertigo    can happen daily   Wears hearing aid in both ears    has, does not wear   Past Surgical History:  Procedure Laterality Date   CATARACT EXTRACTION W/PHACO Right 03/16/2018   Procedure: CATARACT EXTRACTION PHACO AND INTRAOCULAR LENS PLACEMENT (IOC);  Surgeon: Ferol Rogue, MD;  Location: [facility];  Service: Ophthalmology;  Laterality: Right;  US   01:05 CDE 11.85 Fluid pack lot # [id number]   CATARACT EXTRACTION W/PHACO Left 05/08/2020   Procedure: CATARACT EXTRACTION PHACO AND INTRAOCULAR LENS PLACEMENT (IOC) LEFT 7.32 00:54.7 13.4%;  Surgeon: Mittie Gaskin, MD;  Location: [facility];  Service: Ophthalmology;  Laterality: Left;   CHOLECYSTECTOMY     COLONOSCOPY  2015   Dr Jinny- cleared for 5 years    COLONOSCOPY WITH PROPOFOL  N/A 01/17/2018   Procedure: COLONOSCOPY WITH PROPOFOL ;  Surgeon: Jinny Carmine, MD;  Location: [facility];  Service: Endoscopy;  Laterality: N/A;   requests early   HERNIA REPAIR  July 2019   INSERTION OF MESH N/A 08/31/2017   Procedure: INSERTION OF MESH;  Surgeon: Nicholaus Selinda Birmingham, MD;  Location: [facility];  Service: General;  Laterality: N/A;   IR CATHETER TUBE CHANGE  08/05/2023   IR RADIOLOGIST EVAL & MGMT  06/01/2023   IR RADIOLOGIST EVAL & MGMT  06/08/2023   IR RADIOLOGIST EVAL & MGMT  07/13/2023   IR RADIOLOGIST EVAL & MGMT  08/05/2023   IR REMOVAL OF CALCULI/DEBRIS BILIARY DUCT/GB  07/09/2023   IRRIGATION AND DEBRIDEMENT ABSCESS N/A 03/10/2023   Procedure: IRRIGATION AND DEBRIDEMENT ABDOMINAL WALL ABSCESS;  Surgeon: Jordis Laneta FALCON, MD;  Location: [facility];  Service: General;  Laterality: N/A;   POLYPECTOMY  01/17/2018   Procedure: POLYPECTOMY;  Surgeon: Jinny Carmine, MD;  Location: [facility];  Service: Endoscopy;;   UMBILICAL HERNIA REPAIR N/A 08/31/2017   Procedure: HERNIA REPAIR UMBILICAL ADULT;  Surgeon: Nicholaus Selinda Birmingham, MD;  Location: [facility];  Service: General;  Laterality: N/A;   VEIN SURGERY     1 stents in R) leg   Patient Active Problem List   Diagnosis Date Noted   Hepatic abscess 03/10/2023   Abdominal pain 03/10/2023   DM (diabetes mellitus) (HCC) 03/10/2023   Sepsis (HCC) 03/10/2023   Abdominal wall abscess 03/10/2023   Bilateral carotid artery stenosis 09/20/2019   Colon cancer screening  Polyp of sigmoid colon    SOBOE (shortness of breath on exertion) 07/30/2017   PVD (peripheral vascular disease) 11/17/2016   Tobacco use disorder 11/17/2016   Reactive airway disease, mild intermittent, uncomplicated 08/12/2016   Allergic rhinitis due to pollen 08/13/2015   Nocturia 08/13/2015   Atherosclerosis of aorta 02/12/2015   Essential hypertension 02/12/2015   Esophageal reflux 02/12/2015   Depression 02/12/2015   Adult hypothyroidism 02/12/2015   Chronic anxiety 02/12/2015   Hyperlipemia 02/12/2015    PCP: Doretta Button, MD  REFERRING PROVIDER: Alease Oyster, MD  REFERRING DIAG: s/p R  reverse total shoulder arthroplasty  RATIONALE FOR EVALUATION AND TREATMENT: Rehabilitation  THERAPY DIAG: S/P reverse total shoulder arthroplasty, right  Stiffness of right shoulder, not elsewhere classified  Muscle weakness (generalized)  ONSET DATE: 03/08/24, R rTSA  FOLLOW-UP APPT SCHEDULED WITH REFERRING PROVIDER: Yes ; 03/28/24  PERTINENT HISTORY: Pt is a 73 year old male s/p R rTSA (DOS: 03/08/24).  Pt currently reports some confusion regarding his HEP. Pt has completed given exercises from [facility], but did not understand relevance to his shoulder. Pt denies post-op complications. Patient reports some challenge with comfort at night. Pt is smoker and spends a lot of time in his shop to smoke and work on [employer] working.   PAIN:  Pain Intensity: Present: 2/10, Best: 2/10, Worst: 10/10 Pain location: Anterior arm pain Pain Quality: burning  Radiating: No  Numbness/Tingling: Yes; fleeting N/T in 4th-5th digits and thumb; this has subsided Focal Weakness: No Aggravating factors: inadvertent abduction/ER of R arm Relieving factors: None History of prior shoulder or neck/shoulder injury, pain, surgery, or therapy: Yes; chronic R shoulder pain/RTC arthropathy  Falls: Has patient fallen in last 6 months? No, Number of falls: N/A Dominant hand: right Imaging: Yes ;  - X-ray: good post-op follow-up  Red flags (personal history of cancer, chills/fever, night sweats, nausea, vomiting, unrelenting pain, unexplained weight gain/loss): Negative  PRECAUTIONS: Other: s/p R rTSA  WEIGHT BEARING RESTRICTIONS: No  FALLS: Has patient fallen in last 6 months? 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)  Patient Goals: Able to move arm without pain    OBJECTIVE (data from initial evaluation  unless otherwise dated):   Patient Surveys  QuickDASH: 81.8%  Posture Rounded shoulders, mild forward head  AROM AROM (Normal range in degrees) AROM 03/15/24 AROM 04/18/24   Right Left Right Left  Shoulder      Flexion 47 142 87   Extension      Abduction 48 168 75   External Rotation 15 66 30   Internal Rotation      Hands Behind Head      Hands Behind Back            Elbow      Flexion      Extension      Pronation      Supination      (* = pain; Blank rows = not tested)  UE MMT: MMT deferred on initial evaluation due to post-op status* MMT (out of 5) Right Left       Shoulder   Flexion    Extension    Abduction    External rotation    Internal rotation    Horizontal abduction    Horizontal adduction    Lower Trapezius    Rhomboids  Elbow  Flexion    Extension    Pronation    Supination        Wrist  Flexion    Extension    Radial deviation    Ulnar deviation        MCP  Flexion    Extension    Abduction    Adduction    (* = pain; Blank rows = not tested)  Sensation Deferred  Reflexes Deferred  Palpation TTP R ACJ (1), R long head of biceps tendon (1), R lateral deltoid (1), R anterior deltoid (1) (Blank rows = not tested) Graded on 0-4 scale (0 = no pain, 1 = pain, 2 = pain with wincing/grimacing/flinching, 3 = pain with withdrawal, 4 = unwilling to allow palpation), (Blank rows = not tested)     TODAY'S TREATMENT    04/24/2024   SUBJECTIVE STATEMENT:    Patient reports some frustration with ongoing shoulder elevation weakness and difficulty with functional reaching work at home. He feels that he will need to start using arm more normally without limiting activity as much for return to prior level. Pt works on shoulder ROM at home, but he does not necessarily complete exact home program as listed. He has worked on supine and recline AROM/AAROM with wand at home. He reports generally mild pain, but he does have significant  upper arm soreness after PT and AROM work.     Therapeutic Exercise - for shoulder ROM as needed for reaching and self-care ADLs   PROM R shoulder- flexion, abduction, ER (pt in supine position); x 5 minutes  -pt able to passively move up to current limits of protocol (Phase I)  Shoulder flexion AROM in 30-deg reclined position; 1 x 15 Reclined on wedge 45 deg elevated at head; shoulder flexion AROM; 1 x 10  Wand flexion AAROM, 60 deg elevated; 2 x 10  Pulleys for forward elevation AAROM: with 5-10 second holds x 2 min  Standing table slide at wall; x 5  -significant challenge and rapid fatigue Wall slide on table incline, ~60 deg incline; x 10 with towel   PATIENT EDUCATION: Discussed current PT progress, prognosis, POC.    *not today* Cold pack (unbilled) - for anti-inflammatory and analgesic effect as needed for reduced pain and improved ability to participate in active PT intervention, along R shoulder in reclined position, x 5 minutes Standing wand flexion AAROM; 1 x 10  -notable scapular elevation compensation Wand AAROM ER; 1 x 10 -reviewed and discussed allowed ROM per protocol   PATIENT EDUCATION:  Education details: see above for patient education details Person educated: Patient Education method: Explanation, Demonstration, and Handouts Education comprehension: verbalized understanding and returned demonstration   HOME EXERCISE PROGRAM:  Access Code: [email or web address] URL: [email or web address] Date: 03/15/2024 Prepared by: Venetia Endo  Exercises - Supine Shoulder Flexion AAROM with Dowel  - 2 x daily - 7 x weekly - 2 sets - 10 reps - Supine Shoulder External Rotation with Dowel  - 2 x daily - 7 x weekly - 2 sets - 10 reps - Seated Scapular Retraction  - 2 x daily - 7 x weekly - 2 sets - 10 reps - 3sec hold   ASSESSMENT:  CLINICAL IMPRESSION: Patient still has challenge with active forward elevation of R shoulder; his AROM has significantly  improved compared to initial evaluation, and he fortunately has generally low to moderate pain versus severe pain intermittently experienced in acute to sub-acute post-op phases. Patient has expressed some frustration related  to ongoing weakness with forward reaching/forward flexion associated with remaining deltoid weakness and limited AROM. Pt met QuickDASH goal to demonstrate clinically meaningful change; goal was updated to score <25% disability. Pt is not yet ready for strength testing at this time post-operatively. Pt has remaining deficits with R shoulder AROM, functional UE strength, mild to moderate post-operative pain, and postural changes. Pt will continue to benefit from skilled PT services to address deficits and improve function.   OBJECTIVE IMPAIRMENTS: decreased ROM, decreased strength, hypomobility, impaired flexibility, impaired UE functional use, postural dysfunction, and pain.   ACTIVITY LIMITATIONS: carrying, lifting, transfers, bathing, dressing, self feeding, reach over head, and hygiene/grooming  PARTICIPATION LIMITATIONS: meal prep, cleaning, laundry, driving, yard work, and working in his shop  PERSONAL FACTORS: Age, Past/current experiences, and 3+ comorbidities: (chronic tobacco use, PVD, atherosclerosis of aorta, DM, depression/anxietty, HLD) are also affecting patient's functional outcome.   REHAB POTENTIAL: Good  CLINICAL DECISION MAKING: Stable/uncomplicated  EVALUATION COMPLEXITY: Low   GOALS: Goals reviewed with patient? Yes  SHORT TERM GOALS: Target date: 04/10/2024  Pt will be independent with HEP to improve strength and decrease shoulder pain to improve pain-free function at home and work. Baseline: 03/15/24: Pt continuing exercises from [facility] HEP printout (elbow AROM, wrist AROM, gripping); we updated HEP for shoulder AAROM in limited ROM and scap retractions.     04/18/24: Pt reports regular work on shoulder AROM/AAROM at home, sometimes not with specifics  discussed in home program.  Goal status: ON-GOING   LONG TERM GOALS: Target date: 06/01/2024  Pt will have R shoulder active forward elevation to 130 deg as needed for functional ROM for reaching, self-care ADLs, and household tasks Baseline: 03/15/24: R shoulder flexion 47 deg    04/18/24: R shoulder flexion AROM 87 deg Goal status: IN PROGRESS   2.  Pt will decrease worst shoulder pain by at least 3 points on the NPRS in order to demonstrate clinically significant reduction in shoulder pain. Baseline: 03/15/24: 10/10 at worst.     04/18/24: 3/10 at worst.  Goal status: ACHIEVED  3.  Pt will decrease quick DASH score by at least 8% in order to demonstrate clinically significant reduction in disability related to shoulder pain        Baseline: 03/15/24: 81.8%.    04/18/24: 52.3% Goal status: ACHIEVED  4. Pt will have MMT 4+/5 or greater for all tested shoulder muscles (deltoid, RTC, periscapular) in order to demonstrate improvement in strength and function      Baseline: 03/15/24: MMT deferred due to early post-op status.     04/18/24: Deferred until Phase II rehab.  Goal status: DEFERRED  5.  Pt will decrease quick DASH score to 25% disability or less in order to demonstrate clinically significant reduction in disability related to shoulder pain        Baseline: 04/18/24: 52.3% Goal status: INITIAL   PLAN: PT FREQUENCY: 1-2x/week  PT DURATION: 6 weeks  PLANNED INTERVENTIONS: Therapeutic exercises, Therapeutic activity, Neuromuscular re-education, Balance training, Gait training, Patient/Family education, Self Care, Joint mobilization, Joint manipulation, Vestibular training, Canalith repositioning, Orthotic/Fit training, DME instructions, Dry Needling, Electrical stimulation, Spinal manipulation, Spinal mobilization, Cryotherapy, Moist heat, Taping, Traction, Ultrasound, Ionotophoresis 4mg /ml Dexamethasone , Manual therapy, and Re-evaluation.  PLAN FOR NEXT SESSION: Continue with  Phase II rehab; progress ROM as tolerated (A/AAROM), initiate A/AAROM for IR and extension as tolerated. Progress with light resisted forward flexion, ER, and abduction as tolerated; NO resisted internal rotation/extension/scap retraction.    Venetia Endo, PT, DPT #  [id number]  Venetia ONEIDA Endo, PT 04/24/2024, 11:02 AM  "

## 2024-04-26 ENCOUNTER — Ambulatory Visit: Admitting: Physical Therapy

## 2024-04-26 DIAGNOSIS — Z96611 Presence of right artificial shoulder joint: Secondary | ICD-10-CM

## 2024-04-26 DIAGNOSIS — M25611 Stiffness of right shoulder, not elsewhere classified: Secondary | ICD-10-CM

## 2024-04-26 DIAGNOSIS — M6281 Muscle weakness (generalized): Secondary | ICD-10-CM

## 2024-04-26 NOTE — Therapy (Signed)
 " OUTPATIENT PHYSICAL THERAPY TREATMENT   Patient Name: Joel Flores MRN: [id number] DOB:09-Sep-1950, 73 y.o., male Today's Date: 04/26/2024  END OF SESSION:  PT End of Session - 04/26/24 1541     Visit Number 11    Number of Visits 21    Date for Recertification  05/24/24    Authorization Type [employer]    PT Start Time 1543    PT Stop Time 1624    PT Time Calculation (min) 41 min    Equipment Utilized During Treatment --   post-op R shoulder sling   Activity Tolerance Patient tolerated treatment well    Behavior During Therapy WFL for tasks assessed/performed            Past Medical History:  Diagnosis Date   Allergy 1990   Anginal pain    Anxiety    PANIC ATTACKS   Arthritis    right shoulder   Atherosclerosis of aorta 02/12/2015   Atherosclerotic coronary vascular disease 2008   s/p right iliac stent   Cataract    COPD (chronic obstructive pulmonary disease) (HCC)    Cough    Depression    DM (diabetes mellitus) (HCC) 03/10/2023   Dyspnea    Environmental and seasonal allergies    Essential hypertension 02/12/2015   Family history of adverse reaction to anesthesia    Father - 2 - MI, then stroke after Prostate surgery   GERD (gastroesophageal reflux disease)    History of hiatal hernia    Hyperlipidemia    Hypertension    Hypothyroidism    Incisional hernia, without obstruction or gangrene    PVD (peripheral vascular disease) 11/17/2016   Venous insufficiency    Vertigo    can happen daily   Wears hearing aid in both ears    has, does not wear   Past Surgical History:  Procedure Laterality Date   CATARACT EXTRACTION W/PHACO Right 03/16/2018   Procedure: CATARACT EXTRACTION PHACO AND INTRAOCULAR LENS PLACEMENT (IOC);  Surgeon: Ferol Rogue, MD;  Location: [facility];  Service: Ophthalmology;  Laterality: Right;  US   01:05 CDE 11.85 Fluid pack lot # [id number]   CATARACT EXTRACTION W/PHACO Left 05/08/2020   Procedure: CATARACT EXTRACTION PHACO  AND INTRAOCULAR LENS PLACEMENT (IOC) LEFT 7.32 00:54.7 13.4%;  Surgeon: Mittie Gaskin, MD;  Location: [facility];  Service: Ophthalmology;  Laterality: Left;   CHOLECYSTECTOMY     COLONOSCOPY  2015   Dr Jinny- cleared for 5 years    COLONOSCOPY WITH PROPOFOL  N/A 01/17/2018   Procedure: COLONOSCOPY WITH PROPOFOL ;  Surgeon: Jinny Carmine, MD;  Location: [facility];  Service: Endoscopy;  Laterality: N/A;  requests early   HERNIA REPAIR  July 2019   INSERTION OF MESH N/A 08/31/2017   Procedure: INSERTION OF MESH;  Surgeon: Nicholaus Selinda Birmingham, MD;  Location: [facility];  Service: General;  Laterality: N/A;   IR CATHETER TUBE CHANGE  08/05/2023   IR RADIOLOGIST EVAL & MGMT  06/01/2023   IR RADIOLOGIST EVAL & MGMT  06/08/2023   IR RADIOLOGIST EVAL & MGMT  07/13/2023   IR RADIOLOGIST EVAL & MGMT  08/05/2023   IR REMOVAL OF CALCULI/DEBRIS BILIARY DUCT/GB  07/09/2023   IRRIGATION AND DEBRIDEMENT ABSCESS N/A 03/10/2023   Procedure: IRRIGATION AND DEBRIDEMENT ABDOMINAL WALL ABSCESS;  Surgeon: Jordis Laneta FALCON, MD;  Location: [facility];  Service: General;  Laterality: N/A;   POLYPECTOMY  01/17/2018   Procedure: POLYPECTOMY;  Surgeon: Jinny Carmine, MD;  Location: [facility];  Service: Endoscopy;;   UMBILICAL HERNIA REPAIR N/A 08/31/2017   Procedure: HERNIA REPAIR UMBILICAL ADULT;  Surgeon: Nicholaus Selinda Birmingham, MD;  Location: [facility];  Service: General;  Laterality: N/A;   VEIN SURGERY     1 stents in R) leg   Patient Active Problem List   Diagnosis Date Noted   Hepatic abscess 03/10/2023   Abdominal pain 03/10/2023   DM (diabetes mellitus) (HCC) 03/10/2023   Sepsis (HCC) 03/10/2023   Abdominal wall abscess 03/10/2023   Bilateral carotid artery stenosis 09/20/2019   Colon cancer screening    Polyp of sigmoid colon    SOBOE (shortness of breath on exertion) 07/30/2017   PVD (peripheral vascular disease) 11/17/2016   Tobacco use disorder 11/17/2016   Reactive airway disease,  mild intermittent, uncomplicated 08/12/2016   Allergic rhinitis due to pollen 08/13/2015   Nocturia 08/13/2015   Atherosclerosis of aorta 02/12/2015   Essential hypertension 02/12/2015   Esophageal reflux 02/12/2015   Depression 02/12/2015   Adult hypothyroidism 02/12/2015   Chronic anxiety 02/12/2015   Hyperlipemia 02/12/2015    PCP: Doretta Button, MD  REFERRING PROVIDER: Alease Oyster, MD  REFERRING DIAG: s/p R reverse total shoulder arthroplasty  RATIONALE FOR EVALUATION AND TREATMENT: Rehabilitation  THERAPY DIAG: S/P reverse total shoulder arthroplasty, right  Stiffness of right shoulder, not elsewhere classified  Muscle weakness (generalized)  ONSET DATE: 03/08/24, R rTSA  FOLLOW-UP APPT SCHEDULED WITH REFERRING PROVIDER: Yes ; 03/28/24  PERTINENT HISTORY: Pt is a 73 year old male s/p R rTSA (DOS: 03/08/24).  Pt currently reports some confusion regarding his HEP. Pt has completed given exercises from [facility], but did not understand relevance to his shoulder. Pt denies post-op complications. Patient reports some challenge with comfort at night. Pt is smoker and spends a lot of time in his shop to smoke and work on [employer] working.   PAIN:  Pain Intensity: Present: 2/10, Best: 2/10, Worst: 10/10 Pain location: Anterior arm pain Pain Quality: burning  Radiating: No  Numbness/Tingling: Yes; fleeting N/T in 4th-5th digits and thumb; this has subsided Focal Weakness: No Aggravating factors: inadvertent abduction/ER of R arm Relieving factors: None History of prior shoulder or neck/shoulder injury, pain, surgery, or therapy: Yes; chronic R shoulder pain/RTC arthropathy  Falls: Has patient fallen in last 6 months? No, Number of falls: N/A Dominant hand: right Imaging: Yes ;  - X-ray: good post-op follow-up  Red flags (personal history of cancer, chills/fever, night sweats, nausea, vomiting, unrelenting pain, unexplained weight gain/loss): Negative  PRECAUTIONS:  Other: s/p R rTSA  WEIGHT BEARING RESTRICTIONS: No  FALLS: Has patient fallen in last 6 months? 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)  Patient Goals: Able to move arm without pain    OBJECTIVE (data from initial evaluation unless otherwise dated):   Patient Surveys  QuickDASH: 81.8%  Posture Rounded shoulders, mild forward head  AROM AROM (Normal range in degrees) AROM 03/15/24 AROM 04/18/24   Right Left Right Left  Shoulder      Flexion 47 142 87   Extension      Abduction 48 168 75   External Rotation 15 66 30   Internal Rotation      Hands Behind Head      Hands Behind Back            Elbow  Flexion      Extension      Pronation      Supination      (* = pain; Blank rows = not tested)  UE MMT: MMT deferred on initial evaluation due to post-op status* MMT (out of 5) Right Left       Shoulder   Flexion    Extension    Abduction    External rotation    Internal rotation    Horizontal abduction    Horizontal adduction    Lower Trapezius    Rhomboids        Elbow  Flexion    Extension    Pronation    Supination        Wrist  Flexion    Extension    Radial deviation    Ulnar deviation        MCP  Flexion    Extension    Abduction    Adduction    (* = pain; Blank rows = not tested)  Sensation Deferred  Reflexes Deferred  Palpation TTP R ACJ (1), R long head of biceps tendon (1), R lateral deltoid (1), R anterior deltoid (1) (Blank rows = not tested) Graded on 0-4 scale (0 = no pain, 1 = pain, 2 = pain with wincing/grimacing/flinching, 3 = pain with withdrawal, 4 = unwilling to allow palpation), (Blank rows = not tested)     TODAY'S TREATMENT    04/26/2024   SUBJECTIVE STATEMENT:    Patient has continued with HEP and  discontinuing sling at 6 weeks post-op per protocol. Pt reports inadvertently rolling to R side with arm elevated and he woke up more sore and stiff from this.     Therapeutic Exercise - for shoulder ROM as needed for reaching and self-care ADLs   PROM R shoulder- flexion, abduction, ER, IR as tolerated (pt in supine position); x 6 min  Shoulder flexion AAROM in supine, no ROM restrictions; 20x Reclined on wedge 45 deg elevated at head; shoulder flexion AROM; 1 x 10  Wand flexion AAROM, 60 deg elevated; 2 x 10  Pulleys for forward elevation AAROM: with 5-10 second holds x 2 min  Standing incline table slide, 50 deg; 2 x 10  Standing ER with yellow Tbnad; 1 x 10, 1 x 8  PATIENT EDUCATION: Discussed current PT progress and encouraged continued A/AAROM at home without specific motion restrictions at this time.    *not today* Cold pack (unbilled) - for anti-inflammatory and analgesic effect as needed for reduced pain and improved ability to participate in active PT intervention, along R shoulder in reclined position, x 5 minutes Standing wand flexion AAROM; 1 x 10  -notable scapular elevation compensation Wand AAROM ER; 1 x 10 -reviewed and discussed allowed ROM per protocol   PATIENT EDUCATION:  Education details: see above for patient education details Person educated: Patient Education method: Explanation, Demonstration, and Handouts Education comprehension: verbalized understanding and returned demonstration   HOME EXERCISE PROGRAM:  Access Code: [email or web address] URL: [email or web address] Date: 03/15/2024 Prepared by: Venetia Endo  Exercises - Supine Shoulder Flexion AAROM with Dowel  - 2 x daily - 7 x weekly - 2 sets - 10 reps - Supine Shoulder External Rotation with Dowel  - 2 x daily - 7 x weekly - 2 sets - 10 reps - Seated Scapular Retraction  - 2 x daily - 7 x weekly - 2 sets - 10 reps - 3sec hold   ASSESSMENT:  CLINICAL IMPRESSION: Patient is  able  to passively (and with AAROM) elevate his R arm up to 131 deg. Passive ER up to 60 deg. We continued work on Electrical Engineer progression without specific motion restrictions at this time. Pt is able to initiate low-intensity ER isotonic with apparent fatigue and ER weakness. Pt is not yet ready for flexion/abduction strengthening given limited AROM at this time. Pt has remaining deficits with R shoulder AROM, functional UE strength, mild to moderate post-operative pain, and postural changes. Pt will continue to benefit from skilled PT services to address deficits and improve function.   OBJECTIVE IMPAIRMENTS: decreased ROM, decreased strength, hypomobility, impaired flexibility, impaired UE functional use, postural dysfunction, and pain.   ACTIVITY LIMITATIONS: carrying, lifting, transfers, bathing, dressing, self feeding, reach over head, and hygiene/grooming  PARTICIPATION LIMITATIONS: meal prep, cleaning, laundry, driving, yard work, and working in his shop  PERSONAL FACTORS: Age, Past/current experiences, and 3+ comorbidities: (chronic tobacco use, PVD, atherosclerosis of aorta, DM, depression/anxietty, HLD) are also affecting patient's functional outcome.   REHAB POTENTIAL: Good  CLINICAL DECISION MAKING: Stable/uncomplicated  EVALUATION COMPLEXITY: Low   GOALS: Goals reviewed with patient? Yes  SHORT TERM GOALS: Target date: 04/10/2024  Pt will be independent with HEP to improve strength and decrease shoulder pain to improve pain-free function at home and work. Baseline: 03/15/24: Pt continuing exercises from [facility] HEP printout (elbow AROM, wrist AROM, gripping); we updated HEP for shoulder AAROM in limited ROM and scap retractions.     04/18/24: Pt reports regular work on shoulder AROM/AAROM at home, sometimes not with specifics discussed in home program.  Goal status: ON-GOING   LONG TERM GOALS: Target date: 06/01/2024  Pt will have R shoulder active forward elevation to 130 deg  as needed for functional ROM for reaching, self-care ADLs, and household tasks Baseline: 03/15/24: R shoulder flexion 47 deg    04/18/24: R shoulder flexion AROM 87 deg Goal status: IN PROGRESS   2.  Pt will decrease worst shoulder pain by at least 3 points on the NPRS in order to demonstrate clinically significant reduction in shoulder pain. Baseline: 03/15/24: 10/10 at worst.     04/18/24: 3/10 at worst.  Goal status: ACHIEVED  3.  Pt will decrease quick DASH score by at least 8% in order to demonstrate clinically significant reduction in disability related to shoulder pain        Baseline: 03/15/24: 81.8%.    04/18/24: 52.3% Goal status: ACHIEVED  4. Pt will have MMT 4+/5 or greater for all tested shoulder muscles (deltoid, RTC, periscapular) in order to demonstrate improvement in strength and function      Baseline: 03/15/24: MMT deferred due to early post-op status.     04/18/24: Deferred until Phase II rehab.  Goal status: DEFERRED  5.  Pt will decrease quick DASH score to 25% disability or less in order to demonstrate clinically significant reduction in disability related to shoulder pain        Baseline: 04/18/24: 52.3% Goal status: INITIAL   PLAN: PT FREQUENCY: 1-2x/week  PT DURATION: 6 weeks  PLANNED INTERVENTIONS: Therapeutic exercises, Therapeutic activity, Neuromuscular re-education, Balance training, Gait training, Patient/Family education, Self Care, Joint mobilization, Joint manipulation, Vestibular training, Canalith repositioning, Orthotic/Fit training, DME instructions, Dry Needling, Electrical stimulation, Spinal manipulation, Spinal mobilization, Cryotherapy, Moist heat, Taping, Traction, Ultrasound, Ionotophoresis 4mg /ml Dexamethasone , Manual therapy, and Re-evaluation.  PLAN FOR NEXT SESSION: Continue with Phase II rehab; progress ROM as tolerated (A/AAROM), initiate A/AAROM for IR and extension as tolerated. Progress with light  resisted forward flexion, ER, and  abduction as tolerated; NO resisted internal rotation/extension/scap retraction.    Venetia Endo, PT, DPT #[id number]  Venetia ONEIDA Endo, PT 04/26/2024, 4:26 PM  "

## 2024-05-02 ENCOUNTER — Ambulatory Visit: Attending: Orthopedic Surgery | Admitting: Physical Therapy

## 2024-05-02 ENCOUNTER — Encounter: Payer: Self-pay | Admitting: Physical Therapy

## 2024-05-02 DIAGNOSIS — Z96611 Presence of right artificial shoulder joint: Secondary | ICD-10-CM | POA: Insufficient documentation

## 2024-05-02 DIAGNOSIS — M6281 Muscle weakness (generalized): Secondary | ICD-10-CM | POA: Insufficient documentation

## 2024-05-02 DIAGNOSIS — M25611 Stiffness of right shoulder, not elsewhere classified: Secondary | ICD-10-CM | POA: Diagnosis present

## 2024-05-02 NOTE — Therapy (Signed)
 " OUTPATIENT PHYSICAL THERAPY TREATMENT   Patient Name: Joel Flores MRN: [id number] DOB:1950/10/01, 74 y.o., male Today's Date: 05/02/2024  END OF SESSION:  PT End of Session - 05/02/24 0908     Visit Number 12    Number of Visits 21    Date for Recertification  05/24/24    Authorization Type [employer]    PT Start Time 0909    PT Stop Time 0950    PT Time Calculation (min) 41 min    Equipment Utilized During Treatment --   post-op R shoulder sling   Activity Tolerance Patient tolerated treatment well    Behavior During Therapy [facility] for tasks assessed/performed             Past Medical History:  Diagnosis Date   Allergy 1990   Anginal pain    Anxiety    PANIC ATTACKS   Arthritis    right shoulder   Atherosclerosis of aorta 02/12/2015   Atherosclerotic coronary vascular disease 2008   s/p right iliac stent   Cataract    COPD (chronic obstructive pulmonary disease) (HCC)    Cough    Depression    DM (diabetes mellitus) (HCC) 03/10/2023   Dyspnea    Environmental and seasonal allergies    Essential hypertension 02/12/2015   Family history of adverse reaction to anesthesia    Father - 33 - MI, then stroke after Prostate surgery   GERD (gastroesophageal reflux disease)    History of hiatal hernia    Hyperlipidemia    Hypertension    Hypothyroidism    Incisional hernia, without obstruction or gangrene    PVD (peripheral vascular disease) 11/17/2016   Venous insufficiency    Vertigo    can happen daily   Wears hearing aid in both ears    has, does not wear   Past Surgical History:  Procedure Laterality Date   CATARACT EXTRACTION W/PHACO Right 03/16/2018   Procedure: CATARACT EXTRACTION PHACO AND INTRAOCULAR LENS PLACEMENT (IOC);  Surgeon: Ferol Rogue, MD;  Location: [facility];  Service: Ophthalmology;  Laterality: Right;  US   01:05 CDE 11.85 Fluid pack lot # [id number]   CATARACT EXTRACTION W/PHACO Left 05/08/2020   Procedure: CATARACT EXTRACTION PHACO  AND INTRAOCULAR LENS PLACEMENT (IOC) LEFT 7.32 00:54.7 13.4%;  Surgeon: Mittie Gaskin, MD;  Location: [facility];  Service: Ophthalmology;  Laterality: Left;   CHOLECYSTECTOMY     COLONOSCOPY  2015   Dr Jinny- cleared for 5 years    COLONOSCOPY WITH PROPOFOL  N/A 01/17/2018   Procedure: COLONOSCOPY WITH PROPOFOL ;  Surgeon: Jinny Carmine, MD;  Location: [facility];  Service: Endoscopy;  Laterality: N/A;  requests early   HERNIA REPAIR  July 2019   INSERTION OF MESH N/A 08/31/2017   Procedure: INSERTION OF MESH;  Surgeon: Nicholaus Selinda Birmingham, MD;  Location: [facility];  Service: General;  Laterality: N/A;   IR CATHETER TUBE CHANGE  08/05/2023   IR RADIOLOGIST EVAL & MGMT  06/01/2023   IR RADIOLOGIST EVAL & MGMT  06/08/2023   IR RADIOLOGIST EVAL & MGMT  07/13/2023   IR RADIOLOGIST EVAL & MGMT  08/05/2023   IR REMOVAL OF CALCULI/DEBRIS BILIARY DUCT/GB  07/09/2023   IRRIGATION AND DEBRIDEMENT ABSCESS N/A 03/10/2023   Procedure: IRRIGATION AND DEBRIDEMENT ABDOMINAL WALL ABSCESS;  Surgeon: Jordis Laneta FALCON, MD;  Location: [facility];  Service: General;  Laterality: N/A;   POLYPECTOMY  01/17/2018   Procedure: POLYPECTOMY;  Surgeon: Jinny Carmine, MD;  Location: [facility];  Service: Endoscopy;;   UMBILICAL HERNIA REPAIR N/A 08/31/2017   Procedure: HERNIA REPAIR UMBILICAL ADULT;  Surgeon: Nicholaus Selinda Birmingham, MD;  Location: [facility];  Service: General;  Laterality: N/A;   VEIN SURGERY     1 stents in R) leg   Patient Active Problem List   Diagnosis Date Noted   Hepatic abscess 03/10/2023   Abdominal pain 03/10/2023   DM (diabetes mellitus) (HCC) 03/10/2023   Sepsis (HCC) 03/10/2023   Abdominal wall abscess 03/10/2023   Bilateral carotid artery stenosis 09/20/2019   Colon cancer screening    Polyp of sigmoid colon    SOBOE (shortness of breath on exertion) 07/30/2017   PVD (peripheral vascular disease) 11/17/2016   Tobacco use disorder 11/17/2016   Reactive airway disease,  mild intermittent, uncomplicated 08/12/2016   Allergic rhinitis due to pollen 08/13/2015   Nocturia 08/13/2015   Atherosclerosis of aorta 02/12/2015   Essential hypertension 02/12/2015   Esophageal reflux 02/12/2015   Depression 02/12/2015   Adult hypothyroidism 02/12/2015   Chronic anxiety 02/12/2015   Hyperlipemia 02/12/2015    PCP: Doretta Button, MD  REFERRING PROVIDER: Alease Oyster, MD  REFERRING DIAG: s/p R reverse total shoulder arthroplasty  RATIONALE FOR EVALUATION AND TREATMENT: Rehabilitation  THERAPY DIAG: S/P reverse total shoulder arthroplasty, right  Stiffness of right shoulder, not elsewhere classified  Muscle weakness (generalized)  ONSET DATE: 03/08/24, R rTSA  FOLLOW-UP APPT SCHEDULED WITH REFERRING PROVIDER: Yes ; 03/28/24  PERTINENT HISTORY: Pt is a 74 year old male s/p R rTSA (DOS: 03/08/24).  Pt currently reports some confusion regarding his HEP. Pt has completed given exercises from [facility], but did not understand relevance to his shoulder. Pt denies post-op complications. Patient reports some challenge with comfort at night. Pt is smoker and spends a lot of time in his shop to smoke and work on [employer] working.   PAIN:  Pain Intensity: Present: 2/10, Best: 2/10, Worst: 10/10 Pain location: Anterior arm pain Pain Quality: burning  Radiating: No  Numbness/Tingling: Yes; fleeting N/T in 4th-5th digits and thumb; this has subsided Focal Weakness: No Aggravating factors: inadvertent abduction/ER of R arm Relieving factors: None History of prior shoulder or neck/shoulder injury, pain, surgery, or therapy: Yes; chronic R shoulder pain/RTC arthropathy  Falls: Has patient fallen in last 6 months? No, Number of falls: N/A Dominant hand: right Imaging: Yes ;  - X-ray: good post-op follow-up  Red flags (personal history of cancer, chills/fever, night sweats, nausea, vomiting, unrelenting pain, unexplained weight gain/loss): Negative  PRECAUTIONS:  Other: s/p R rTSA  WEIGHT BEARING RESTRICTIONS: No  FALLS: Has patient fallen in last 6 months? 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)  Patient Goals: Able to move arm without pain    OBJECTIVE (data from initial evaluation unless otherwise dated):   Patient Surveys  QuickDASH: 81.8%  Posture Rounded shoulders, mild forward head  AROM AROM (Normal range in degrees) AROM 03/15/24 AROM 04/18/24   Right Left Right Left  Shoulder      Flexion 47 142 87   Extension      Abduction 48 168 75   External Rotation 15 66 30   Internal Rotation      Hands Behind Head      Hands Behind Back            Elbow  Flexion      Extension      Pronation      Supination      (* = pain; Blank rows = not tested)  UE MMT: MMT deferred on initial evaluation due to post-op status* MMT (out of 5) Right Left       Shoulder   Flexion    Extension    Abduction    External rotation    Internal rotation    Horizontal abduction    Horizontal adduction    Lower Trapezius    Rhomboids        Elbow  Flexion    Extension    Pronation    Supination        Wrist  Flexion    Extension    Radial deviation    Ulnar deviation        MCP  Flexion    Extension    Abduction    Adduction    (* = pain; Blank rows = not tested)  Sensation Deferred  Reflexes Deferred  Palpation TTP R ACJ (1), R long head of biceps tendon (1), R lateral deltoid (1), R anterior deltoid (1) (Blank rows = not tested) Graded on 0-4 scale (0 = no pain, 1 = pain, 2 = pain with wincing/grimacing/flinching, 3 = pain with withdrawal, 4 = unwilling to allow palpation), (Blank rows = not tested)     TODAY'S TREATMENT    05/02/2024   SUBJECTIVE STATEMENT:    Patient reports no pain at arrival. He reports  limited compliance with HEP due to some challenge with lifting his arm.    Therapeutic Exercise - for shoulder ROM as needed for reaching and self-care ADLs  Upper body ergometer, 2 minutes forward, 2 minutes backward - for tissue warm-up to improve muscle performance, improved soft tissue mobility/extensibility - interval subjective gathered  PROM R shoulder- flexion, abduction, ER, IR as tolerated (pt in supine position); x 5 min  Reclined on wedge 45 deg elevated at head; shoulder flexion AROM; 2 x 10  Wand flexion AAROM, 50 deg elevated; 2 x 10  Pulleys for forward elevation and scaption AAROM; x 2 min  Finger ladder, forward elevation; x 10, 5 sec hold   Standing ER with yellow Tband; 2 x 10  PATIENT EDUCATION: Discussed current progress and re-assured pt on current AROM progress.   *not today* Standing incline table slide, 50 deg; 2 x 10 Shoulder flexion AAROM in supine, no ROM restrictions; 20x Cold pack (unbilled) - for anti-inflammatory and analgesic effect as needed for reduced pain and improved ability to participate in active PT intervention, along R shoulder in reclined position, x 5 minutes Standing wand flexion AAROM; 1 x 10  -notable scapular elevation compensation Wand AAROM ER; 1 x 10 -reviewed and discussed allowed ROM per protocol   PATIENT EDUCATION:  Education details: see above for patient education details Person educated: Patient Education method: Explanation, Demonstration, and Handouts Education comprehension: verbalized understanding and returned demonstration   HOME EXERCISE PROGRAM:  Access Code: [email or web address] URL: [email or web address] Date: 03/15/2024 Prepared by: Venetia Endo  Exercises - Supine Shoulder Flexion AAROM with Dowel  - 2 x daily - 7 x weekly - 2 sets - 10 reps - Supine Shoulder External Rotation with Dowel  - 2 x daily - 7 x weekly - 2 sets - 10 reps - Seated Scapular Retraction  - 2 x daily - 7 x weekly - 2  sets - 10 reps - 3sec hold  ASSESSMENT:  CLINICAL IMPRESSION: Patient is able to better access forward elevation AROM. He has minimal pain, but ongoing deltoid weakness is limiting his ability to complete reaching above shoulder height. Pt has remaining deficits with R shoulder AROM, functional UE strength, mild to moderate post-operative pain, and postural changes. Pt will continue to benefit from skilled PT services to address deficits and improve function.   OBJECTIVE IMPAIRMENTS: decreased ROM, decreased strength, hypomobility, impaired flexibility, impaired UE functional use, postural dysfunction, and pain.   ACTIVITY LIMITATIONS: carrying, lifting, transfers, bathing, dressing, self feeding, reach over head, and hygiene/grooming  PARTICIPATION LIMITATIONS: meal prep, cleaning, laundry, driving, yard work, and working in his shop  PERSONAL FACTORS: Age, Past/current experiences, and 3+ comorbidities: (chronic tobacco use, PVD, atherosclerosis of aorta, DM, depression/anxietty, HLD) are also affecting patient's functional outcome.   REHAB POTENTIAL: Good  CLINICAL DECISION MAKING: Stable/uncomplicated  EVALUATION COMPLEXITY: Low   GOALS: Goals reviewed with patient? Yes  SHORT TERM GOALS: Target date: 04/10/2024  Pt will be independent with HEP to improve strength and decrease shoulder pain to improve pain-free function at home and work. Baseline: 03/15/24: Pt continuing exercises from [facility] HEP printout (elbow AROM, wrist AROM, gripping); we updated HEP for shoulder AAROM in limited ROM and scap retractions.     04/18/24: Pt reports regular work on shoulder AROM/AAROM at home, sometimes not with specifics discussed in home program.  Goal status: ON-GOING   LONG TERM GOALS: Target date: 06/01/2024  Pt will have R shoulder active forward elevation to 130 deg as needed for functional ROM for reaching, self-care ADLs, and household tasks Baseline: 03/15/24: R shoulder flexion 47  deg    04/18/24: R shoulder flexion AROM 87 deg Goal status: IN PROGRESS   2.  Pt will decrease worst shoulder pain by at least 3 points on the NPRS in order to demonstrate clinically significant reduction in shoulder pain. Baseline: 03/15/24: 10/10 at worst.     04/18/24: 3/10 at worst.  Goal status: ACHIEVED  3.  Pt will decrease quick DASH score by at least 8% in order to demonstrate clinically significant reduction in disability related to shoulder pain        Baseline: 03/15/24: 81.8%.    04/18/24: 52.3% Goal status: ACHIEVED  4. Pt will have MMT 4+/5 or greater for all tested shoulder muscles (deltoid, RTC, periscapular) in order to demonstrate improvement in strength and function      Baseline: 03/15/24: MMT deferred due to early post-op status.     04/18/24: Deferred until Phase II rehab.  Goal status: DEFERRED  5.  Pt will decrease quick DASH score to 25% disability or less in order to demonstrate clinically significant reduction in disability related to shoulder pain        Baseline: 04/18/24: 52.3% Goal status: INITIAL   PLAN: PT FREQUENCY: 1-2x/week  PT DURATION: 6 weeks  PLANNED INTERVENTIONS: Therapeutic exercises, Therapeutic activity, Neuromuscular re-education, Balance training, Gait training, Patient/Family education, Self Care, Joint mobilization, Joint manipulation, Vestibular training, Canalith repositioning, Orthotic/Fit training, DME instructions, Dry Needling, Electrical stimulation, Spinal manipulation, Spinal mobilization, Cryotherapy, Moist heat, Taping, Traction, Ultrasound, Ionotophoresis 4mg /ml Dexamethasone , Manual therapy, and Re-evaluation.  PLAN FOR NEXT SESSION: Continue with Phase II rehab; progress ROM as tolerated (A/AAROM), initiate A/AAROM for IR and extension as tolerated. Progress with light resisted forward flexion, ER, and abduction as tolerated; NO resisted internal rotation/extension/scap retraction.    Venetia Endo, PT, DPT  #[id number]  Venetia ONEIDA Endo, PT 05/02/2024, 10:08 AM  "

## 2024-05-04 ENCOUNTER — Ambulatory Visit: Admitting: Physical Therapy

## 2024-05-04 DIAGNOSIS — Z96611 Presence of right artificial shoulder joint: Secondary | ICD-10-CM

## 2024-05-04 DIAGNOSIS — M6281 Muscle weakness (generalized): Secondary | ICD-10-CM

## 2024-05-04 DIAGNOSIS — M25611 Stiffness of right shoulder, not elsewhere classified: Secondary | ICD-10-CM

## 2024-05-04 NOTE — Therapy (Signed)
 " OUTPATIENT PHYSICAL THERAPY TREATMENT   Patient Name: Joel Flores MRN: [id number] DOB:10/01/1950, 74 y.o., male Today's Date: 05/04/2024  END OF SESSION:  PT End of Session - 05/04/24 0933     Visit Number 13    Number of Visits 21    Date for Recertification  05/24/24    Authorization Type [employer]    PT Start Time 0912    PT Stop Time 0955    PT Time Calculation (min) 43 min    Equipment Utilized During Treatment --   post-op R shoulder sling   Activity Tolerance Patient tolerated treatment well    Behavior During Therapy [facility] for tasks assessed/performed              Past Medical History:  Diagnosis Date   Allergy 1990   Anginal pain    Anxiety    PANIC ATTACKS   Arthritis    right shoulder   Atherosclerosis of aorta 02/12/2015   Atherosclerotic coronary vascular disease 2008   s/p right iliac stent   Cataract    COPD (chronic obstructive pulmonary disease) (HCC)    Cough    Depression    DM (diabetes mellitus) (HCC) 03/10/2023   Dyspnea    Environmental and seasonal allergies    Essential hypertension 02/12/2015   Family history of adverse reaction to anesthesia    Father - 67 - MI, then stroke after Prostate surgery   GERD (gastroesophageal reflux disease)    History of hiatal hernia    Hyperlipidemia    Hypertension    Hypothyroidism    Incisional hernia, without obstruction or gangrene    PVD (peripheral vascular disease) 11/17/2016   Venous insufficiency    Vertigo    can happen daily   Wears hearing aid in both ears    has, does not wear   Past Surgical History:  Procedure Laterality Date   CATARACT EXTRACTION W/PHACO Right 03/16/2018   Procedure: CATARACT EXTRACTION PHACO AND INTRAOCULAR LENS PLACEMENT (IOC);  Surgeon: Ferol Rogue, MD;  Location: [facility];  Service: Ophthalmology;  Laterality: Right;  US   01:05 CDE 11.85 Fluid pack lot # [id number]   CATARACT EXTRACTION W/PHACO Left 05/08/2020   Procedure: CATARACT EXTRACTION  PHACO AND INTRAOCULAR LENS PLACEMENT (IOC) LEFT 7.32 00:54.7 13.4%;  Surgeon: Mittie Gaskin, MD;  Location: [facility];  Service: Ophthalmology;  Laterality: Left;   CHOLECYSTECTOMY     COLONOSCOPY  2015   Dr Jinny- cleared for 5 years    COLONOSCOPY WITH PROPOFOL  N/A 01/17/2018   Procedure: COLONOSCOPY WITH PROPOFOL ;  Surgeon: Jinny Carmine, MD;  Location: [facility];  Service: Endoscopy;  Laterality: N/A;  requests early   HERNIA REPAIR  July 2019   INSERTION OF MESH N/A 08/31/2017   Procedure: INSERTION OF MESH;  Surgeon: Nicholaus Selinda Birmingham, MD;  Location: [facility];  Service: General;  Laterality: N/A;   IR CATHETER TUBE CHANGE  08/05/2023   IR RADIOLOGIST EVAL & MGMT  06/01/2023   IR RADIOLOGIST EVAL & MGMT  06/08/2023   IR RADIOLOGIST EVAL & MGMT  07/13/2023   IR RADIOLOGIST EVAL & MGMT  08/05/2023   IR REMOVAL OF CALCULI/DEBRIS BILIARY DUCT/GB  07/09/2023   IRRIGATION AND DEBRIDEMENT ABSCESS N/A 03/10/2023   Procedure: IRRIGATION AND DEBRIDEMENT ABDOMINAL WALL ABSCESS;  Surgeon: Jordis Laneta FALCON, MD;  Location: [facility];  Service: General;  Laterality: N/A;   POLYPECTOMY  01/17/2018   Procedure: POLYPECTOMY;  Surgeon: Jinny Carmine, MD;  Location: [facility];  Service: Endoscopy;;   UMBILICAL HERNIA REPAIR N/A 08/31/2017   Procedure: HERNIA REPAIR UMBILICAL ADULT;  Surgeon: Nicholaus Selinda Birmingham, MD;  Location: [facility];  Service: General;  Laterality: N/A;   VEIN SURGERY     1 stents in R) leg   Patient Active Problem List   Diagnosis Date Noted   Hepatic abscess 03/10/2023   Abdominal pain 03/10/2023   DM (diabetes mellitus) (HCC) 03/10/2023   Sepsis (HCC) 03/10/2023   Abdominal wall abscess 03/10/2023   Bilateral carotid artery stenosis 09/20/2019   Colon cancer screening    Polyp of sigmoid colon    SOBOE (shortness of breath on exertion) 07/30/2017   PVD (peripheral vascular disease) 11/17/2016   Tobacco use disorder 11/17/2016   Reactive airway  disease, mild intermittent, uncomplicated 08/12/2016   Allergic rhinitis due to pollen 08/13/2015   Nocturia 08/13/2015   Atherosclerosis of aorta 02/12/2015   Essential hypertension 02/12/2015   Esophageal reflux 02/12/2015   Depression 02/12/2015   Adult hypothyroidism 02/12/2015   Chronic anxiety 02/12/2015   Hyperlipemia 02/12/2015    PCP: Doretta Button, MD  REFERRING PROVIDER: Alease Oyster, MD  REFERRING DIAG: s/p R reverse total shoulder arthroplasty  RATIONALE FOR EVALUATION AND TREATMENT: Rehabilitation  THERAPY DIAG: S/P reverse total shoulder arthroplasty, right  Stiffness of right shoulder, not elsewhere classified  Muscle weakness (generalized)  ONSET DATE: 03/08/24, R rTSA  FOLLOW-UP APPT SCHEDULED WITH REFERRING PROVIDER: Yes ; 03/28/24  PERTINENT HISTORY: Pt is a 74 year old male s/p R rTSA (DOS: 03/08/24).  Pt currently reports some confusion regarding his HEP. Pt has completed given exercises from [facility], but did not understand relevance to his shoulder. Pt denies post-op complications. Patient reports some challenge with comfort at night. Pt is smoker and spends a lot of time in his shop to smoke and work on [employer] working.   PAIN:  Pain Intensity: Present: 2/10, Best: 2/10, Worst: 10/10 Pain location: Anterior arm pain Pain Quality: burning  Radiating: No  Numbness/Tingling: Yes; fleeting N/T in 4th-5th digits and thumb; this has subsided Focal Weakness: No Aggravating factors: inadvertent abduction/ER of R arm Relieving factors: None History of prior shoulder or neck/shoulder injury, pain, surgery, or therapy: Yes; chronic R shoulder pain/RTC arthropathy  Falls: Has patient fallen in last 6 months? No, Number of falls: N/A Dominant hand: right Imaging: Yes ;  - X-ray: good post-op follow-up  Red flags (personal history of cancer, chills/fever, night sweats, nausea, vomiting, unrelenting pain, unexplained weight gain/loss):  Negative  PRECAUTIONS: Other: s/p R rTSA  WEIGHT BEARING RESTRICTIONS: No  FALLS: Has patient fallen in last 6 months? 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)  Patient Goals: Able to move arm without pain    OBJECTIVE (data from initial evaluation unless otherwise dated):   Patient Surveys  QuickDASH: 81.8%  Posture Rounded shoulders, mild forward head  AROM AROM (Normal range in degrees) AROM 03/15/24 AROM 04/18/24   Right Left Right Left  Shoulder      Flexion 47 142 87   Extension      Abduction 48 168 75   External Rotation 15 66 30   Internal Rotation      Hands Behind Head      Hands Behind Back            Elbow  Flexion      Extension      Pronation      Supination      (* = pain; Blank rows = not tested)  UE MMT: MMT deferred on initial evaluation due to post-op status* MMT (out of 5) Right Left       Shoulder   Flexion    Extension    Abduction    External rotation    Internal rotation    Horizontal abduction    Horizontal adduction    Lower Trapezius    Rhomboids        Elbow  Flexion    Extension    Pronation    Supination        Wrist  Flexion    Extension    Radial deviation    Ulnar deviation        MCP  Flexion    Extension    Abduction    Adduction    (* = pain; Blank rows = not tested)  Sensation Deferred  Reflexes Deferred  Palpation TTP R ACJ (1), R long head of biceps tendon (1), R lateral deltoid (1), R anterior deltoid (1) (Blank rows = not tested) Graded on 0-4 scale (0 = no pain, 1 = pain, 2 = pain with wincing/grimacing/flinching, 3 = pain with withdrawal, 4 = unwilling to allow palpation), (Blank rows = not tested)     TODAY'S TREATMENT    05/04/2024   SUBJECTIVE STATEMENT:    Patient reports  intermittent R anterior shoulder/anterior deltoid soreness and tightness with forward elevation intermittently. He reports primary discomfort with trying to sleep - he tends to lie on R side with R shoulder elevated/elbow extended.    Therapeutic Exercise - for shoulder ROM as needed for reaching and self-care ADLs  Upper body ergometer, 2 minutes forward, 2 minutes backward - for tissue warm-up to improve muscle performance, improved soft tissue mobility/extensibility - interval subjective gathered  PROM R shoulder- flexion, abduction, ER, IR as tolerated (pt in supine position); x 5 min  Supine shoulder flexion with Dbell; 1# 1 x 15, 2# 1 x 10  Reclined on wedge 50 deg elevated at head; shoulder flexion AROM; 2 x 8  Wand flexion AAROM, 60 deg elevated; 2 x 10  Finger ladder, forward elevation and scaption; x 10, 5 sec hold    PATIENT EDUCATION: Answered pt questions regarding current activities allowed; discussed contraindication for resisted extension/pulling and internal rotation. Advised against aggressive functional IR (hand behind back) ROM.    *next visit* Standing ER with yellow Tband; 2 x 10   *not today* Pulleys for forward elevation and scaption AAROM; x 2 min Standing incline table slide, 50 deg; 2 x 10 Shoulder flexion AAROM in supine, no ROM restrictions; 20x Cold pack (unbilled) - for anti-inflammatory and analgesic effect as needed for reduced pain and improved ability to participate in active PT intervention, along R shoulder in reclined position, x 5 minutes Standing wand flexion AAROM; 1 x 10  -notable scapular elevation compensation Wand AAROM ER; 1 x 10 -reviewed and discussed allowed ROM per protocol   PATIENT EDUCATION:  Education details: see above for patient education details Person educated: Patient Education method: Explanation, Demonstration, and Handouts Education comprehension: verbalized understanding and returned demonstration   HOME  EXERCISE PROGRAM:  Access Code: [email or web address] URL: [email or web address] Date: 05/04/2024 Prepared by: Venetia Endo  Exercises - Supine Shoulder Flexion AAROM with Dowel  - 2 x daily - 7 x weekly -  2 sets - 10 reps - Supine Shoulder External Rotation with Dowel  - 2 x daily - 7 x weekly - 2 sets - 10 reps - Reclined Shoulder Elevation  - 2 x daily - 7 x weekly - 2 sets - 10 reps   ASSESSMENT:  CLINICAL IMPRESSION: Patient has progressed well with passive and AAROM relative to post-operative status. He is able to access forward elevation AROM > 90 deg, but not up to his full available range at this time. We are able to modestly progress forward flexion resistive exercise with use of light Dbell in supine position for flexion. Pt exhibits ER A/PROM [facility] with elbow at his side (versus ER in abducted position). Pt has remaining deficits with R shoulder AROM, functional UE strength, mild to moderate post-operative pain, and postural changes. Pt will continue to benefit from skilled PT services to address deficits and improve function.   OBJECTIVE IMPAIRMENTS: decreased ROM, decreased strength, hypomobility, impaired flexibility, impaired UE functional use, postural dysfunction, and pain.   ACTIVITY LIMITATIONS: carrying, lifting, transfers, bathing, dressing, self feeding, reach over head, and hygiene/grooming  PARTICIPATION LIMITATIONS: meal prep, cleaning, laundry, driving, yard work, and working in his shop  PERSONAL FACTORS: Age, Past/current experiences, and 3+ comorbidities: (chronic tobacco use, PVD, atherosclerosis of aorta, DM, depression/anxietty, HLD) are also affecting patient's functional outcome.   REHAB POTENTIAL: Good  CLINICAL DECISION MAKING: Stable/uncomplicated  EVALUATION COMPLEXITY: Low   GOALS: Goals reviewed with patient? Yes  SHORT TERM GOALS: Target date: 04/10/2024  Pt will be independent with HEP to improve strength and decrease shoulder pain to  improve pain-free function at home and work. Baseline: 03/15/24: Pt continuing exercises from [facility] HEP printout (elbow AROM, wrist AROM, gripping); we updated HEP for shoulder AAROM in limited ROM and scap retractions.     04/18/24: Pt reports regular work on shoulder AROM/AAROM at home, sometimes not with specifics discussed in home program.  Goal status: ON-GOING   LONG TERM GOALS: Target date: 06/01/2024  Pt will have R shoulder active forward elevation to 130 deg as needed for functional ROM for reaching, self-care ADLs, and household tasks Baseline: 03/15/24: R shoulder flexion 47 deg    04/18/24: R shoulder flexion AROM 87 deg Goal status: IN PROGRESS   2.  Pt will decrease worst shoulder pain by at least 3 points on the NPRS in order to demonstrate clinically significant reduction in shoulder pain. Baseline: 03/15/24: 10/10 at worst.     04/18/24: 3/10 at worst.  Goal status: ACHIEVED  3.  Pt will decrease quick DASH score by at least 8% in order to demonstrate clinically significant reduction in disability related to shoulder pain        Baseline: 03/15/24: 81.8%.    04/18/24: 52.3% Goal status: ACHIEVED  4. Pt will have MMT 4+/5 or greater for all tested shoulder muscles (deltoid, RTC, periscapular) in order to demonstrate improvement in strength and function      Baseline: 03/15/24: MMT deferred due to early post-op status.     04/18/24: Deferred until Phase II rehab.  Goal status: DEFERRED  5.  Pt will decrease quick DASH score to 25% disability or less in order to demonstrate clinically significant reduction in disability related to shoulder pain        Baseline: 04/18/24: 52.3% Goal status: INITIAL   PLAN: PT FREQUENCY: 1-2x/week  PT DURATION: 6 weeks  PLANNED INTERVENTIONS: Therapeutic exercises, Therapeutic activity, Neuromuscular re-education, Balance training, Gait training, Patient/Family education, Self Care, Joint mobilization,  Joint manipulation, Vestibular  training, Canalith repositioning, Orthotic/Fit training, DME instructions, Dry Needling, Electrical stimulation, Spinal manipulation, Spinal mobilization, Cryotherapy, Moist heat, Taping, Traction, Ultrasound, Ionotophoresis 4mg /ml Dexamethasone , Manual therapy, and Re-evaluation.  PLAN FOR NEXT SESSION: Continue with Phase II rehab; progress ROM as tolerated (A/AAROM), initiate A/AAROM for IR and extension as tolerated. Progress with light resisted forward flexion, ER, and abduction as tolerated; NO resisted internal rotation/extension/scap retraction.    Venetia Endo, PT, DPT #[id number]  Venetia ONEIDA Endo, PT 05/04/2024, 10:08 AM  "

## 2024-05-09 ENCOUNTER — Encounter: Payer: Self-pay | Admitting: Physical Therapy

## 2024-05-09 ENCOUNTER — Ambulatory Visit: Admitting: Physical Therapy

## 2024-05-09 DIAGNOSIS — Z96611 Presence of right artificial shoulder joint: Secondary | ICD-10-CM

## 2024-05-09 DIAGNOSIS — M25611 Stiffness of right shoulder, not elsewhere classified: Secondary | ICD-10-CM

## 2024-05-09 DIAGNOSIS — M6281 Muscle weakness (generalized): Secondary | ICD-10-CM

## 2024-05-09 NOTE — Therapy (Addendum)
 " OUTPATIENT PHYSICAL THERAPY TREATMENT   Patient Name: Joel Flores MRN: [id number] DOB:04/17/1951, 74 y.o., male Today's Date: 05/09/2024  END OF SESSION:  PT End of Session - 05/09/24 0900     Visit Number 14    Number of Visits 21    Date for Recertification  05/24/24    Authorization Type [employer]    PT Start Time 0902    PT Stop Time 0944    PT Time Calculation (min) 42 min    Activity Tolerance Patient tolerated treatment well    Behavior During Therapy [facility] for tasks assessed/performed               Past Medical History:  Diagnosis Date   Allergy 1990   Anginal pain    Anxiety    PANIC ATTACKS   Arthritis    right shoulder   Atherosclerosis of aorta 02/12/2015   Atherosclerotic coronary vascular disease 2008   s/p right iliac stent   Cataract    COPD (chronic obstructive pulmonary disease) (HCC)    Cough    Depression    DM (diabetes mellitus) (HCC) 03/10/2023   Dyspnea    Environmental and seasonal allergies    Essential hypertension 02/12/2015   Family history of adverse reaction to anesthesia    Father - 30 - MI, then stroke after Prostate surgery   GERD (gastroesophageal reflux disease)    History of hiatal hernia    Hyperlipidemia    Hypertension    Hypothyroidism    Incisional hernia, without obstruction or gangrene    PVD (peripheral vascular disease) 11/17/2016   Venous insufficiency    Vertigo    can happen daily   Wears hearing aid in both ears    has, does not wear   Past Surgical History:  Procedure Laterality Date   CATARACT EXTRACTION W/PHACO Right 03/16/2018   Procedure: CATARACT EXTRACTION PHACO AND INTRAOCULAR LENS PLACEMENT (IOC);  Surgeon: Ferol Rogue, MD;  Location: [facility];  Service: Ophthalmology;  Laterality: Right;  US   01:05 CDE 11.85 Fluid pack lot # [id number]   CATARACT EXTRACTION W/PHACO Left 05/08/2020   Procedure: CATARACT EXTRACTION PHACO AND INTRAOCULAR LENS PLACEMENT (IOC) LEFT 7.32 00:54.7 13.4%;   Surgeon: Mittie Gaskin, MD;  Location: [facility];  Service: Ophthalmology;  Laterality: Left;   CHOLECYSTECTOMY     COLONOSCOPY  2015   Dr Jinny- cleared for 5 years    COLONOSCOPY WITH PROPOFOL  N/A 01/17/2018   Procedure: COLONOSCOPY WITH PROPOFOL ;  Surgeon: Jinny Carmine, MD;  Location: [facility];  Service: Endoscopy;  Laterality: N/A;  requests early   HERNIA REPAIR  July 2019   INSERTION OF MESH N/A 08/31/2017   Procedure: INSERTION OF MESH;  Surgeon: Nicholaus Selinda Birmingham, MD;  Location: [facility];  Service: General;  Laterality: N/A;   IR CATHETER TUBE CHANGE  08/05/2023   IR RADIOLOGIST EVAL & MGMT  06/01/2023   IR RADIOLOGIST EVAL & MGMT  06/08/2023   IR RADIOLOGIST EVAL & MGMT  07/13/2023   IR RADIOLOGIST EVAL & MGMT  08/05/2023   IR REMOVAL OF CALCULI/DEBRIS BILIARY DUCT/GB  07/09/2023   IRRIGATION AND DEBRIDEMENT ABSCESS N/A 03/10/2023   Procedure: IRRIGATION AND DEBRIDEMENT ABDOMINAL WALL ABSCESS;  Surgeon: Jordis Laneta FALCON, MD;  Location: [facility];  Service: General;  Laterality: N/A;   POLYPECTOMY  01/17/2018   Procedure: POLYPECTOMY;  Surgeon: Jinny Carmine, MD;  Location: [facility];  Service: Endoscopy;;   UMBILICAL HERNIA REPAIR N/A 08/31/2017  Procedure: HERNIA REPAIR UMBILICAL ADULT;  Surgeon: Nicholaus Selinda Birmingham, MD;  Location: [facility];  Service: General;  Laterality: N/A;   VEIN SURGERY     1 stents in R) leg   Patient Active Problem List   Diagnosis Date Noted   Hepatic abscess 03/10/2023   Abdominal pain 03/10/2023   DM (diabetes mellitus) (HCC) 03/10/2023   Sepsis (HCC) 03/10/2023   Abdominal wall abscess 03/10/2023   Bilateral carotid artery stenosis 09/20/2019   Colon cancer screening    Polyp of sigmoid colon    SOBOE (shortness of breath on exertion) 07/30/2017   PVD (peripheral vascular disease) 11/17/2016   Tobacco use disorder 11/17/2016   Reactive airway disease, mild intermittent, uncomplicated 08/12/2016   Allergic  rhinitis due to pollen 08/13/2015   Nocturia 08/13/2015   Atherosclerosis of aorta 02/12/2015   Essential hypertension 02/12/2015   Esophageal reflux 02/12/2015   Depression 02/12/2015   Adult hypothyroidism 02/12/2015   Chronic anxiety 02/12/2015   Hyperlipemia 02/12/2015    PCP: Doretta Button, MD  REFERRING PROVIDER: Alease Oyster, MD  REFERRING DIAG: s/p R reverse total shoulder arthroplasty  RATIONALE FOR EVALUATION AND TREATMENT: Rehabilitation  THERAPY DIAG: S/P reverse total shoulder arthroplasty, right  Stiffness of right shoulder, not elsewhere classified  Muscle weakness (generalized)  ONSET DATE: 03/08/24, R rTSA  FOLLOW-UP APPT SCHEDULED WITH REFERRING PROVIDER: Yes ; 03/28/24  PERTINENT HISTORY: Pt is a 74 year old male s/p R rTSA (DOS: 03/08/24).  Pt currently reports some confusion regarding his HEP. Pt has completed given exercises from [facility], but did not understand relevance to his shoulder. Pt denies post-op complications. Patient reports some challenge with comfort at night. Pt is smoker and spends a lot of time in his shop to smoke and work on [employer] working.   PAIN:  Pain Intensity: Present: 2/10, Best: 2/10, Worst: 10/10 Pain location: Anterior arm pain Pain Quality: burning  Radiating: No  Numbness/Tingling: Yes; fleeting N/T in 4th-5th digits and thumb; this has subsided Focal Weakness: No Aggravating factors: inadvertent abduction/ER of R arm Relieving factors: None History of prior shoulder or neck/shoulder injury, pain, surgery, or therapy: Yes; chronic R shoulder pain/RTC arthropathy  Falls: Has patient fallen in last 6 months? No, Number of falls: N/A Dominant hand: right Imaging: Yes ;  - X-ray: good post-op follow-up  Red flags (personal history of cancer, chills/fever, night sweats, nausea, vomiting, unrelenting pain, unexplained weight gain/loss): Negative  PRECAUTIONS: Other: s/p R rTSA  WEIGHT BEARING RESTRICTIONS:  No  FALLS: Has patient fallen in last 6 months? 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)  Patient Goals: Able to move arm without pain    OBJECTIVE (data from initial evaluation unless otherwise dated):   Patient Surveys  QuickDASH: 81.8%  Posture Rounded shoulders, mild forward head  AROM AROM (Normal range in degrees) AROM 03/15/24 AROM 04/18/24   Right Left Right Left  Shoulder      Flexion 47 142 87   Extension      Abduction 48 168 75   External Rotation 15 66 30   Internal Rotation      Hands Behind Head      Hands Behind Back            Elbow      Flexion      Extension  Pronation      Supination      (* = pain; Blank rows = not tested)  UE MMT: MMT deferred on initial evaluation due to post-op status* MMT (out of 5) Right Left       Shoulder   Flexion    Extension    Abduction    External rotation    Internal rotation    Horizontal abduction    Horizontal adduction    Lower Trapezius    Rhomboids        Elbow  Flexion    Extension    Pronation    Supination        Wrist  Flexion    Extension    Radial deviation    Ulnar deviation        MCP  Flexion    Extension    Abduction    Adduction    (* = pain; Blank rows = not tested)  Sensation Deferred  Reflexes Deferred  Palpation TTP R ACJ (1), R long head of biceps tendon (1), R lateral deltoid (1), R anterior deltoid (1) (Blank rows = not tested) Graded on 0-4 scale (0 = no pain, 1 = pain, 2 = pain with wincing/grimacing/flinching, 3 = pain with withdrawal, 4 = unwilling to allow palpation), (Blank rows = not tested)     TODAY'S TREATMENT    05/09/2024   SUBJECTIVE STATEMENT:    Patient reports R anterior shoulder/anterior deltoid soreness, burning and tightness with forward  elevation and abduction intermittently. He reports no pain, and demonstrates deltoid weakness and soreness. Pt is seeing ortho surgeon today to discuss lack of AROM and strength. Pt also complains of popping in the shoulder with certain motions, although reports no pain with mechanical sx.   Therapeutic Exercise - for shoulder ROM as needed for reaching and self-care ADLs  Upper body ergometer, 4 minutes forward - for tissue warm-up to improve muscle performance, improved soft tissue mobility/extensibility  - interval subjective gathered  PROM R shoulder- flexion, abduction, ER, IR as tolerated (pt in supine position); x 5 min  Supine shoulder flexion with Dbell; 1# 1 x 15, 2# 1 x 10  Reclined on wedge 50 deg elevated at head; shoulder flexion AROM; 2 x 8  Wand flexion AAROM, 60 deg elevated; 2 x 10  Finger ladder, forward elevation and scaption; x 10, 5 sec hold    PATIENT EDUCATION: Answered pt questions regarding current activities allowed; discussed contraindication for resisted extension/pulling and internal rotation. Advised against aggressive functional IR (hand behind back) ROM.    *next visit* Standing ER with yellow Tband; 2 x 10   *not today* Pulleys for forward elevation and scaption AAROM; x 2 min Standing incline table slide, 50 deg; 2 x 10 Shoulder flexion AAROM in supine, no ROM restrictions; 20x Cold pack (unbilled) - for anti-inflammatory and analgesic effect as needed for reduced pain and improved ability to participate in active PT intervention, along R shoulder in reclined position, x 5 minutes Standing wand flexion AAROM; 1 x 10  -notable scapular elevation compensation Wand AAROM ER; 1 x 10 -reviewed and discussed allowed ROM per protocol   PATIENT EDUCATION:  Education details: see above for patient education details Person educated: Patient Education method: Explanation, Demonstration, and Handouts Education comprehension: verbalized understanding and  returned demonstration   HOME EXERCISE PROGRAM:  Access Code: [email or web address] URL: [email or web address] Date: 05/04/2024 Prepared by: Venetia Endo  Exercises - Supine Shoulder Flexion AAROM with Dowel  - 2  x daily - 7 x weekly - 2 sets - 10 reps - Supine Shoulder External Rotation with Dowel  - 2 x daily - 7 x weekly - 2 sets - 10 reps - Reclined Shoulder Elevation  - 2 x daily - 7 x weekly - 2 sets - 10 reps   ASSESSMENT:  CLINICAL IMPRESSION: Patient progressing well with passive and AAROM relative to post-operative status. He is able to access forward elevation AROM > 90 deg, but not up to his full available range at this time. We are able to modestly progress forward flexion resistive exercise with use of light Dbell in supine position for flexion. Pt has remaining deficits with R shoulder AROM and functional UE strength. Pt will continue to benefit from skilled PT services to address deficits and improve function.    OBJECTIVE IMPAIRMENTS: decreased ROM, decreased strength, hypomobility, impaired flexibility, impaired UE functional use, postural dysfunction, and pain.   ACTIVITY LIMITATIONS: carrying, lifting, transfers, bathing, dressing, self feeding, reach over head, and hygiene/grooming  PARTICIPATION LIMITATIONS: meal prep, cleaning, laundry, driving, yard work, and working in his shop  PERSONAL FACTORS: Age, Past/current experiences, and 3+ comorbidities: (chronic tobacco use, PVD, atherosclerosis of aorta, DM, depression/anxietty, HLD) are also affecting patient's functional outcome.   REHAB POTENTIAL: Good  CLINICAL DECISION MAKING: Stable/uncomplicated  EVALUATION COMPLEXITY: Low   GOALS: Goals reviewed with patient? Yes  SHORT TERM GOALS: Target date: 04/10/2024  Pt will be independent with HEP to improve strength and decrease shoulder pain to improve pain-free function at home and work. Baseline: 03/15/24: Pt continuing exercises from [facility] HEP  printout (elbow AROM, wrist AROM, gripping); we updated HEP for shoulder AAROM in limited ROM and scap retractions.     04/18/24: Pt reports regular work on shoulder AROM/AAROM at home, sometimes not with specifics discussed in home program.  Goal status: ON-GOING   LONG TERM GOALS: Target date: 06/01/2024  Pt will have R shoulder active forward elevation to 130 deg as needed for functional ROM for reaching, self-care ADLs, and household tasks Baseline: 03/15/24: R shoulder flexion 47 deg    04/18/24: R shoulder flexion AROM 87 deg Goal status: IN PROGRESS   2.  Pt will decrease worst shoulder pain by at least 3 points on the NPRS in order to demonstrate clinically significant reduction in shoulder pain. Baseline: 03/15/24: 10/10 at worst.     04/18/24: 3/10 at worst.  Goal status: ACHIEVED  3.  Pt will decrease quick DASH score by at least 8% in order to demonstrate clinically significant reduction in disability related to shoulder pain        Baseline: 03/15/24: 81.8%.    04/18/24: 52.3% Goal status: ACHIEVED  4. Pt will have MMT 4+/5 or greater for all tested shoulder muscles (deltoid, RTC, periscapular) in order to demonstrate improvement in strength and function      Baseline: 03/15/24: MMT deferred due to early post-op status.     04/18/24: Deferred until Phase II rehab.  Goal status: DEFERRED  5.  Pt will decrease quick DASH score to 25% disability or less in order to demonstrate clinically significant reduction in disability related to shoulder pain        Baseline: 04/18/24: 52.3% Goal status: INITIAL   PLAN: PT FREQUENCY: 1-2x/week  PT DURATION: 6 weeks  PLANNED INTERVENTIONS: Therapeutic exercises, Therapeutic activity, Neuromuscular re-education, Balance training, Gait training, Patient/Family education, Self Care, Joint mobilization, Joint manipulation, Vestibular training, Canalith repositioning, Orthotic/Fit training, DME instructions, Dry Needling, Electrical  stimulation,  Spinal manipulation, Spinal mobilization, Cryotherapy, Moist heat, Taping, Traction, Ultrasound, Ionotophoresis 4mg /ml Dexamethasone , Manual therapy, and Re-evaluation.  PLAN FOR NEXT SESSION: Continue with Phase II rehab; progress ROM as tolerated (A/AAROM), initiate A/AAROM for IR and extension as tolerated. Progress with light resisted forward flexion, ER, and abduction as tolerated; NO resisted internal rotation/extension/scap retraction.     Venetia ONEIDA Endo, PT 05/09/2024, 11:01 AM  "

## 2024-05-11 ENCOUNTER — Encounter: Payer: Self-pay | Admitting: Physical Therapy

## 2024-05-11 ENCOUNTER — Ambulatory Visit: Admitting: Physical Therapy

## 2024-05-11 DIAGNOSIS — M25611 Stiffness of right shoulder, not elsewhere classified: Secondary | ICD-10-CM

## 2024-05-11 DIAGNOSIS — Z96611 Presence of right artificial shoulder joint: Secondary | ICD-10-CM

## 2024-05-11 DIAGNOSIS — M6281 Muscle weakness (generalized): Secondary | ICD-10-CM

## 2024-05-11 NOTE — Therapy (Addendum)
 " OUTPATIENT PHYSICAL THERAPY TREATMENT   Patient Name: Joel Flores MRN: [id number] DOB:28-Sep-1950, 74 y.o., male Today's Date: 05/11/2024  END OF SESSION:  PT End of Session - 05/11/24 0939     Visit Number 15    Number of Visits 21    Date for Recertification  05/24/24    Authorization Type [employer]    PT Start Time [phone]    PT Stop Time 0935    PT Time Calculation (min) 45 min    Activity Tolerance Patient tolerated treatment well    Behavior During Therapy [facility] for tasks assessed/performed                Past Medical History:  Diagnosis Date   Allergy 1990   Anginal pain    Anxiety    PANIC ATTACKS   Arthritis    right shoulder   Atherosclerosis of aorta 02/12/2015   Atherosclerotic coronary vascular disease 2008   s/p right iliac stent   Cataract    COPD (chronic obstructive pulmonary disease) (HCC)    Cough    Depression    DM (diabetes mellitus) (HCC) 03/10/2023   Dyspnea    Environmental and seasonal allergies    Essential hypertension 02/12/2015   Family history of adverse reaction to anesthesia    Father - 42 - MI, then stroke after Prostate surgery   GERD (gastroesophageal reflux disease)    History of hiatal hernia    Hyperlipidemia    Hypertension    Hypothyroidism    Incisional hernia, without obstruction or gangrene    PVD (peripheral vascular disease) 11/17/2016   Venous insufficiency    Vertigo    can happen daily   Wears hearing aid in both ears    has, does not wear   Past Surgical History:  Procedure Laterality Date   CATARACT EXTRACTION W/PHACO Right 03/16/2018   Procedure: CATARACT EXTRACTION PHACO AND INTRAOCULAR LENS PLACEMENT (IOC);  Surgeon: Ferol Rogue, MD;  Location: [facility];  Service: Ophthalmology;  Laterality: Right;  US   01:05 CDE 11.85 Fluid pack lot # [id number]   CATARACT EXTRACTION W/PHACO Left 05/08/2020   Procedure: CATARACT EXTRACTION PHACO AND INTRAOCULAR LENS PLACEMENT (IOC) LEFT 7.32 00:54.7 13.4%;   Surgeon: Mittie Gaskin, MD;  Location: [facility];  Service: Ophthalmology;  Laterality: Left;   CHOLECYSTECTOMY     COLONOSCOPY  2015   Dr Jinny- cleared for 5 years    COLONOSCOPY WITH PROPOFOL  N/A 01/17/2018   Procedure: COLONOSCOPY WITH PROPOFOL ;  Surgeon: Jinny Carmine, MD;  Location: [facility];  Service: Endoscopy;  Laterality: N/A;  requests early   HERNIA REPAIR  July 2019   INSERTION OF MESH N/A 08/31/2017   Procedure: INSERTION OF MESH;  Surgeon: Nicholaus Selinda Birmingham, MD;  Location: [facility];  Service: General;  Laterality: N/A;   IR CATHETER TUBE CHANGE  08/05/2023   IR RADIOLOGIST EVAL & MGMT  06/01/2023   IR RADIOLOGIST EVAL & MGMT  06/08/2023   IR RADIOLOGIST EVAL & MGMT  07/13/2023   IR RADIOLOGIST EVAL & MGMT  08/05/2023   IR REMOVAL OF CALCULI/DEBRIS BILIARY DUCT/GB  07/09/2023   IRRIGATION AND DEBRIDEMENT ABSCESS N/A 03/10/2023   Procedure: IRRIGATION AND DEBRIDEMENT ABDOMINAL WALL ABSCESS;  Surgeon: Jordis Laneta FALCON, MD;  Location: [facility];  Service: General;  Laterality: N/A;   POLYPECTOMY  01/17/2018   Procedure: POLYPECTOMY;  Surgeon: Jinny Carmine, MD;  Location: [facility];  Service: Endoscopy;;   UMBILICAL HERNIA REPAIR N/A  08/31/2017   Procedure: HERNIA REPAIR UMBILICAL ADULT;  Surgeon: Nicholaus Selinda Birmingham, MD;  Location: [facility];  Service: General;  Laterality: N/A;   VEIN SURGERY     1 stents in R) leg   Patient Active Problem List   Diagnosis Date Noted   Hepatic abscess 03/10/2023   Abdominal pain 03/10/2023   DM (diabetes mellitus) (HCC) 03/10/2023   Sepsis (HCC) 03/10/2023   Abdominal wall abscess 03/10/2023   Bilateral carotid artery stenosis 09/20/2019   Colon cancer screening    Polyp of sigmoid colon    SOBOE (shortness of breath on exertion) 07/30/2017   PVD (peripheral vascular disease) 11/17/2016   Tobacco use disorder 11/17/2016   Reactive airway disease, mild intermittent, uncomplicated 08/12/2016   Allergic  rhinitis due to pollen 08/13/2015   Nocturia 08/13/2015   Atherosclerosis of aorta 02/12/2015   Essential hypertension 02/12/2015   Esophageal reflux 02/12/2015   Depression 02/12/2015   Adult hypothyroidism 02/12/2015   Chronic anxiety 02/12/2015   Hyperlipemia 02/12/2015    PCP: Doretta Button, MD  REFERRING PROVIDER: Alease Oyster, MD  REFERRING DIAG: s/p R reverse total shoulder arthroplasty  RATIONALE FOR EVALUATION AND TREATMENT: Rehabilitation  THERAPY DIAG: S/P reverse total shoulder arthroplasty, right  Stiffness of right shoulder, not elsewhere classified  Muscle weakness (generalized)  ONSET DATE: 03/08/24, R rTSA  FOLLOW-UP APPT SCHEDULED WITH REFERRING PROVIDER: Yes ; 03/28/24  PERTINENT HISTORY: Pt is a 74 year old male s/p R rTSA (DOS: 03/08/24).  Pt currently reports some confusion regarding his HEP. Pt has completed given exercises from [facility], but did not understand relevance to his shoulder. Pt denies post-op complications. Patient reports some challenge with comfort at night. Pt is smoker and spends a lot of time in his shop to smoke and work on [employer] working.   PAIN:  Pain Intensity: Present: 2/10, Best: 2/10, Worst: 10/10 Pain location: Anterior arm pain Pain Quality: burning  Radiating: No  Numbness/Tingling: Yes; fleeting N/T in 4th-5th digits and thumb; this has subsided Focal Weakness: No Aggravating factors: inadvertent abduction/ER of R arm Relieving factors: None History of prior shoulder or neck/shoulder injury, pain, surgery, or therapy: Yes; chronic R shoulder pain/RTC arthropathy  Falls: Has patient fallen in last 6 months? No, Number of falls: N/A Dominant hand: right Imaging: Yes ;  - X-ray: good post-op follow-up  Red flags (personal history of cancer, chills/fever, night sweats, nausea, vomiting, unrelenting pain, unexplained weight gain/loss): Negative  PRECAUTIONS: Other: s/p R rTSA  WEIGHT BEARING RESTRICTIONS:  No  FALLS: Has patient fallen in last 6 months? 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)  Patient Goals: Able to move arm without pain    OBJECTIVE (data from initial evaluation unless otherwise dated):   Patient Surveys  QuickDASH: 81.8%  Posture Rounded shoulders, mild forward head  AROM AROM (Normal range in degrees) AROM 03/15/24 AROM 04/18/24   Right Left Right Left  Shoulder      Flexion 47 142 87   Extension      Abduction 48 168 75   External Rotation 15 66 30   Internal Rotation      Hands Behind Head      Hands Behind Back            Elbow      Flexion      Extension  Pronation      Supination      (* = pain; Blank rows = not tested)  UE MMT: MMT deferred on initial evaluation due to post-op status* MMT (out of 5) Right Left       Shoulder   Flexion    Extension    Abduction    External rotation    Internal rotation    Horizontal abduction    Horizontal adduction    Lower Trapezius    Rhomboids        Elbow  Flexion    Extension    Pronation    Supination        Wrist  Flexion    Extension    Radial deviation    Ulnar deviation        MCP  Flexion    Extension    Abduction    Adduction    (* = pain; Blank rows = not tested)  Sensation Deferred  Reflexes Deferred  Palpation TTP R ACJ (1), R long head of biceps tendon (1), R lateral deltoid (1), R anterior deltoid (1) (Blank rows = not tested) Graded on 0-4 scale (0 = no pain, 1 = pain, 2 = pain with wincing/grimacing/flinching, 3 = pain with withdrawal, 4 = unwilling to allow palpation), (Blank rows = not tested)     TODAY'S TREATMENT    05/11/2024   SUBJECTIVE STATEMENT: Pt presents with no significant pain or soreness to today's session. He still reports difficulty with  R shoulder AROM. He saw the orthopedic surgeon this week and felt reassured that his progress is doing well. X rays were taken at the appointment and demonstrated no mechanical loosening. Pt reports feeling better about his recovery after seeing the surgeon.   Therapeutic Exercise - for shoulder ROM as needed for reaching and self-care ADLs  Upper body ergometer, 4 minutes forward - for tissue warm-up to improve muscle performance, improved soft tissue mobility/extensibility  - interval subjective gathered  PROM R shoulder- flexion, abduction, ER, IR as tolerated (pt in supine position); x 5 min  Supine shoulder flexion with Dbell; 1# 1 x 15, 2# 1 x 10  Reclined on wedge 50 deg elevated at head; shoulder flexion AROM; 2 x 8  Wand flexion AAROM, 60 deg elevated; 2 x 10  Finger ladder, forward elevation and scaption; x 10, 5 sec hold   Standing ER with yellow Tband; 2 x 10   PATIENT EDUCATION: Discussed current PT progress and expectations moving forward with PT.   **next session** Resisted ER with red band 2 x 10 Abduction finger ladder Towel IR stretch   *not today* Pulleys for forward elevation and scaption AAROM; x 2 min Standing incline table slide, 50 deg; 2 x 10 Shoulder flexion AAROM in supine, no ROM restrictions; 20x Cold pack (unbilled) - for anti-inflammatory and analgesic effect as needed for reduced pain and improved ability to participate in active PT intervention, along R shoulder in reclined position, x 5 minutes Standing wand flexion AAROM; 1 x 10  -notable scapular elevation compensation Wand AAROM ER; 1 x 10 -reviewed and discussed allowed ROM per protocol   PATIENT EDUCATION:  Education details: see above for patient education details Person educated: Patient Education method: Explanation, Demonstration, and Handouts Education comprehension: verbalized understanding and returned demonstration   HOME EXERCISE PROGRAM:  Access Code: [email or web address] URL:  [email or web address] Date: 05/04/2024 Prepared by: Venetia Endo  Exercises - Supine Shoulder Flexion AAROM with Dowel  - 2 x daily -  7 x weekly - 2 sets - 10 reps - Supine Shoulder External Rotation with Dowel  - 2 x daily - 7 x weekly - 2 sets - 10 reps - Reclined Shoulder Elevation  - 2 x daily - 7 x weekly - 2 sets - 10 reps   ASSESSMENT:  CLINICAL IMPRESSION: Patient progressing well with PT, he reports no pain or soreness. Pt reports some burning when doing exercises, but that sensation goes away after stopping the exercise. Pt is ready to progress to more resisted ER exercises, and he is ready for some IR ROM exercises.  Pt has remaining deficits with R shoulder AROM and functional UE strength. Pt will continue to benefit from skilled PT services to address deficits and improve function.    OBJECTIVE IMPAIRMENTS: decreased ROM, decreased strength, hypomobility, impaired flexibility, impaired UE functional use, postural dysfunction, and pain.   ACTIVITY LIMITATIONS: carrying, lifting, transfers, bathing, dressing, self feeding, reach over head, and hygiene/grooming  PARTICIPATION LIMITATIONS: meal prep, cleaning, laundry, driving, yard work, and working in his shop  PERSONAL FACTORS: Age, Past/current experiences, and 3+ comorbidities: (chronic tobacco use, PVD, atherosclerosis of aorta, DM, depression/anxietty, HLD) are also affecting patient's functional outcome.   REHAB POTENTIAL: Good  CLINICAL DECISION MAKING: Stable/uncomplicated  EVALUATION COMPLEXITY: Low   GOALS: Goals reviewed with patient? Yes  SHORT TERM GOALS: Target date: 04/10/2024  Pt will be independent with HEP to improve strength and decrease shoulder pain to improve pain-free function at home and work. Baseline: 03/15/24: Pt continuing exercises from [facility] HEP printout (elbow AROM, wrist AROM, gripping); we updated HEP for shoulder AAROM in limited ROM and scap retractions.     04/18/24:  Pt reports regular work on shoulder AROM/AAROM at home, sometimes not with specifics discussed in home program.  Goal status: ON-GOING   LONG TERM GOALS: Target date: 06/01/2024  Pt will have R shoulder active forward elevation to 130 deg as needed for functional ROM for reaching, self-care ADLs, and household tasks Baseline: 03/15/24: R shoulder flexion 47 deg    04/18/24: R shoulder flexion AROM 87 deg Goal status: IN PROGRESS   2.  Pt will decrease worst shoulder pain by at least 3 points on the NPRS in order to demonstrate clinically significant reduction in shoulder pain. Baseline: 03/15/24: 10/10 at worst.     04/18/24: 3/10 at worst.  Goal status: ACHIEVED  3.  Pt will decrease quick DASH score by at least 8% in order to demonstrate clinically significant reduction in disability related to shoulder pain        Baseline: 03/15/24: 81.8%.    04/18/24: 52.3% Goal status: ACHIEVED  4. Pt will have MMT 4+/5 or greater for all tested shoulder muscles (deltoid, RTC, periscapular) in order to demonstrate improvement in strength and function      Baseline: 03/15/24: MMT deferred due to early post-op status.     04/18/24: Deferred until Phase II rehab.  Goal status: DEFERRED  5.  Pt will decrease quick DASH score to 25% disability or less in order to demonstrate clinically significant reduction in disability related to shoulder pain        Baseline: 04/18/24: 52.3% Goal status: INITIAL   PLAN: PT FREQUENCY: 1-2x/week  PT DURATION: 6 weeks  PLANNED INTERVENTIONS: Therapeutic exercises, Therapeutic activity, Neuromuscular re-education, Balance training, Gait training, Patient/Family education, Self Care, Joint mobilization, Joint manipulation, Vestibular training, Canalith repositioning, Orthotic/Fit training, DME instructions, Dry Needling, Electrical stimulation, Spinal manipulation, Spinal mobilization, Cryotherapy, Moist heat, Taping, Traction, Ultrasound,  Ionotophoresis 4mg /ml  Dexamethasone , Manual therapy, and Re-evaluation.  PLAN FOR NEXT SESSION: Continue with Phase II rehab; progress ROM as tolerated (A/AAROM), initiate A/AAROM for IR and extension as tolerated. Progress with light resisted forward flexion, ER, and abduction as tolerated; NO resisted internal rotation/extension/scap retraction.     Huston Band, Student-PT 05/11/2024, 10:01 AM  "

## 2024-05-16 ENCOUNTER — Ambulatory Visit: Admitting: Physical Therapy

## 2024-05-16 ENCOUNTER — Encounter: Payer: Self-pay | Admitting: Physical Therapy

## 2024-05-16 DIAGNOSIS — Z96611 Presence of right artificial shoulder joint: Secondary | ICD-10-CM

## 2024-05-16 DIAGNOSIS — M25611 Stiffness of right shoulder, not elsewhere classified: Secondary | ICD-10-CM

## 2024-05-16 DIAGNOSIS — M6281 Muscle weakness (generalized): Secondary | ICD-10-CM

## 2024-05-16 NOTE — Therapy (Addendum)
 " OUTPATIENT PHYSICAL THERAPY TREATMENT   Patient Name: Joel Flores MRN: [id number] DOB:Jun 21, 1950, 74 y.o., male Today's Date: 05/16/2024  END OF SESSION:  PT End of Session - 05/16/24 0855     Visit Number 16    Number of Visits 21    Date for Recertification  05/24/24    Authorization Type [employer]    PT Start Time [phone]    PT Stop Time 0940    PT Time Calculation (min) 47 min    Activity Tolerance Patient tolerated treatment well    Behavior During Therapy [facility] for tasks assessed/performed            Past Medical History:  Diagnosis Date   Allergy 1990   Anginal pain    Anxiety    PANIC ATTACKS   Arthritis    right shoulder   Atherosclerosis of aorta 02/12/2015   Atherosclerotic coronary vascular disease 2008   s/p right iliac stent   Cataract    COPD (chronic obstructive pulmonary disease) (HCC)    Cough    Depression    DM (diabetes mellitus) (HCC) 03/10/2023   Dyspnea    Environmental and seasonal allergies    Essential hypertension 02/12/2015   Family history of adverse reaction to anesthesia    Father - 75 - MI, then stroke after Prostate surgery   GERD (gastroesophageal reflux disease)    History of hiatal hernia    Hyperlipidemia    Hypertension    Hypothyroidism    Incisional hernia, without obstruction or gangrene    PVD (peripheral vascular disease) 11/17/2016   Venous insufficiency    Vertigo    can happen daily   Wears hearing aid in both ears    has, does not wear   Past Surgical History:  Procedure Laterality Date   CATARACT EXTRACTION W/PHACO Right 03/16/2018   Procedure: CATARACT EXTRACTION PHACO AND INTRAOCULAR LENS PLACEMENT (IOC);  Surgeon: Ferol Rogue, MD;  Location: [facility];  Service: Ophthalmology;  Laterality: Right;  US   01:05 CDE 11.85 Fluid pack lot # [id number]   CATARACT EXTRACTION W/PHACO Left 05/08/2020   Procedure: CATARACT EXTRACTION PHACO AND INTRAOCULAR LENS PLACEMENT (IOC) LEFT 7.32 00:54.7 13.4%;   Surgeon: Mittie Gaskin, MD;  Location: [facility];  Service: Ophthalmology;  Laterality: Left;   CHOLECYSTECTOMY     COLONOSCOPY  2015   Dr Jinny- cleared for 5 years    COLONOSCOPY WITH PROPOFOL  N/A 01/17/2018   Procedure: COLONOSCOPY WITH PROPOFOL ;  Surgeon: Jinny Carmine, MD;  Location: [facility];  Service: Endoscopy;  Laterality: N/A;  requests early   HERNIA REPAIR  July 2019   INSERTION OF MESH N/A 08/31/2017   Procedure: INSERTION OF MESH;  Surgeon: Nicholaus Selinda Birmingham, MD;  Location: [facility];  Service: General;  Laterality: N/A;   IR CATHETER TUBE CHANGE  08/05/2023   IR RADIOLOGIST EVAL & MGMT  06/01/2023   IR RADIOLOGIST EVAL & MGMT  06/08/2023   IR RADIOLOGIST EVAL & MGMT  07/13/2023   IR RADIOLOGIST EVAL & MGMT  08/05/2023   IR REMOVAL OF CALCULI/DEBRIS BILIARY DUCT/GB  07/09/2023   IRRIGATION AND DEBRIDEMENT ABSCESS N/A 03/10/2023   Procedure: IRRIGATION AND DEBRIDEMENT ABDOMINAL WALL ABSCESS;  Surgeon: Jordis Laneta FALCON, MD;  Location: [facility];  Service: General;  Laterality: N/A;   POLYPECTOMY  01/17/2018   Procedure: POLYPECTOMY;  Surgeon: Jinny Carmine, MD;  Location: [facility];  Service: Endoscopy;;   UMBILICAL HERNIA REPAIR N/A 08/31/2017   Procedure:  HERNIA REPAIR UMBILICAL ADULT;  Surgeon: Nicholaus Selinda Birmingham, MD;  Location: [facility];  Service: General;  Laterality: N/A;   VEIN SURGERY     1 stents in R) leg   Patient Active Problem List   Diagnosis Date Noted   Hepatic abscess 03/10/2023   Abdominal pain 03/10/2023   DM (diabetes mellitus) (HCC) 03/10/2023   Sepsis (HCC) 03/10/2023   Abdominal wall abscess 03/10/2023   Bilateral carotid artery stenosis 09/20/2019   Colon cancer screening    Polyp of sigmoid colon    SOBOE (shortness of breath on exertion) 07/30/2017   PVD (peripheral vascular disease) 11/17/2016   Tobacco use disorder 11/17/2016   Reactive airway disease, mild intermittent, uncomplicated 08/12/2016   Allergic  rhinitis due to pollen 08/13/2015   Nocturia 08/13/2015   Atherosclerosis of aorta 02/12/2015   Essential hypertension 02/12/2015   Esophageal reflux 02/12/2015   Depression 02/12/2015   Adult hypothyroidism 02/12/2015   Chronic anxiety 02/12/2015   Hyperlipemia 02/12/2015    PCP: Doretta Button, MD  REFERRING PROVIDER: Alease Oyster, MD  REFERRING DIAG: s/p R reverse total shoulder arthroplasty  RATIONALE FOR EVALUATION AND TREATMENT: Rehabilitation  THERAPY DIAG: S/P reverse total shoulder arthroplasty, right  Stiffness of right shoulder, not elsewhere classified  Muscle weakness (generalized)  ONSET DATE: 03/08/24, R rTSA  FOLLOW-UP APPT SCHEDULED WITH REFERRING PROVIDER: Yes ; 03/28/24  PERTINENT HISTORY: Pt is a 74 year old male s/p R rTSA (DOS: 03/08/24).  Pt currently reports some confusion regarding his HEP. Pt has completed given exercises from [facility], but did not understand relevance to his shoulder. Pt denies post-op complications. Patient reports some challenge with comfort at night. Pt is smoker and spends a lot of time in his shop to smoke and work on [employer] working.   PAIN:  Pain Intensity: Present: 2/10, Best: 2/10, Worst: 10/10 Pain location: Anterior arm pain Pain Quality: burning  Radiating: No  Numbness/Tingling: Yes; fleeting N/T in 4th-5th digits and thumb; this has subsided Focal Weakness: No Aggravating factors: inadvertent abduction/ER of R arm Relieving factors: None History of prior shoulder or neck/shoulder injury, pain, surgery, or therapy: Yes; chronic R shoulder pain/RTC arthropathy  Falls: Has patient fallen in last 6 months? No, Number of falls: N/A Dominant hand: right Imaging: Yes ;  - X-ray: good post-op follow-up  Red flags (personal history of cancer, chills/fever, night sweats, nausea, vomiting, unrelenting pain, unexplained weight gain/loss): Negative  PRECAUTIONS: Other: s/p R rTSA  WEIGHT BEARING RESTRICTIONS:  No  FALLS: Has patient fallen in last 6 months? 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)  Patient Goals: Able to move arm without pain    OBJECTIVE (data from initial evaluation unless otherwise dated):   Patient Surveys  QuickDASH: 81.8%  Posture Rounded shoulders, mild forward head  AROM AROM (Normal range in degrees) AROM 03/15/24 AROM 04/18/24   Right Left Right Left  Shoulder      Flexion 47 142 87   Extension      Abduction 48 168 75   External Rotation 15 66 30   Internal Rotation      Hands Behind Head      Hands Behind Back            Elbow      Flexion      Extension  Pronation      Supination      (* = pain; Blank rows = not tested)  UE MMT: MMT deferred on initial evaluation due to post-op status* MMT (out of 5) Right Left       Shoulder   Flexion    Extension    Abduction    External rotation    Internal rotation    Horizontal abduction    Horizontal adduction    Lower Trapezius    Rhomboids        Elbow  Flexion    Extension    Pronation    Supination        Wrist  Flexion    Extension    Radial deviation    Ulnar deviation        MCP  Flexion    Extension    Abduction    Adduction    (* = pain; Blank rows = not tested)  Sensation Deferred  Reflexes Deferred  Palpation TTP R ACJ (1), R long head of biceps tendon (1), R lateral deltoid (1), R anterior deltoid (1) (Blank rows = not tested) Graded on 0-4 scale (0 = no pain, 1 = pain, 2 = pain with wincing/grimacing/flinching, 3 = pain with withdrawal, 4 = unwilling to allow palpation), (Blank rows = not tested)     TODAY'S TREATMENT    05/16/2024   SUBJECTIVE STATEMENT: Patient presents with no significant pain. He reports discomfort with no particular motions. He was  watching a movie over the weekend and the shoulder started hurting intermittently. He reports it feeling like a sting.   Therapeutic Exercise - for shoulder ROM as needed for reaching and self-care ADLs  Upper body ergometer, 5 minutes forward - for tissue warm-up to improve muscle performance, improved soft tissue mobility/extensibility  - interval subjective gathered  PROM R shoulder- flexion, abduction, ER, IR as tolerated (pt in supine position); x 5 min  Supine shoulder flexion with Dbell; 1# 1 x 15, 2# 1 x 10  Reclined on wedge 50 deg elevated at head; shoulder flexion AROM; 2 x 10  Wand flexion AAROM, 75 deg elevated; 2 x 15  Finger ladder, forward elevation and abduction; 3 x 10, 5 sec hold   Standing ER with yellow Tband; 2 x 10  Pulleys for forward elevation and scaption AAROM; x 2 min  Resisted ER with red band 2 x 10  Towel IR stretch 3 x 30 sec  **next session** Resisted abduction with yellow band   PATIENT EDUCATION: Discussed current PT progress and expectations moving forward with PT.   *not today* Standing incline table slide, 50 deg; 2 x 10 Shoulder flexion AAROM in supine, no ROM restrictions; 20x Cold pack (unbilled) - for anti-inflammatory and analgesic effect as needed for reduced pain and improved ability to participate in active PT intervention, along R shoulder in reclined position, x 5 minutes Standing wand flexion AAROM; 1 x 10  -notable scapular elevation compensation Wand AAROM ER; 1 x 10 -reviewed and discussed allowed ROM per protocol   PATIENT EDUCATION:  Education details: see above for patient education details Person educated: Patient Education method: Explanation, Demonstration, and Handouts Education comprehension: verbalized understanding and returned demonstration   HOME EXERCISE PROGRAM:  Access Code: [email or web address] URL: [email or web address] Date: 05/04/2024 Prepared by: Venetia Endo  Exercises - Supine  Shoulder Flexion AAROM with Dowel  - 2 x daily - 7 x weekly - 2 sets - 10 reps - Supine Shoulder External Rotation  with Dowel  - 2 x daily - 7 x weekly - 2 sets - 10 reps - Reclined Shoulder Elevation  - 2 x daily - 7 x weekly - 2 sets - 10 reps   ASSESSMENT:  CLINICAL IMPRESSION: Patient is progressing well with PT, he reports no pain or discomfort. We were able to progress resisted ER with the red band and we began some passive stretching into IR. Patient will benefit from skilled PT intervention to improve strength and mobility for adequate daily function. Patient reported no pain during session and had no remaining questions.    OBJECTIVE IMPAIRMENTS: decreased ROM, decreased strength, hypomobility, impaired flexibility, impaired UE functional use, postural dysfunction, and pain.   ACTIVITY LIMITATIONS: carrying, lifting, transfers, bathing, dressing, self feeding, reach over head, and hygiene/grooming  PARTICIPATION LIMITATIONS: meal prep, cleaning, laundry, driving, yard work, and working in his shop  PERSONAL FACTORS: Age, Past/current experiences, and 3+ comorbidities: (chronic tobacco use, PVD, atherosclerosis of aorta, DM, depression/anxietty, HLD) are also affecting patient's functional outcome.   REHAB POTENTIAL: Good  CLINICAL DECISION MAKING: Stable/uncomplicated  EVALUATION COMPLEXITY: Low   GOALS: Goals reviewed with patient? Yes  SHORT TERM GOALS: Target date: 04/10/2024  Pt will be independent with HEP to improve strength and decrease shoulder pain to improve pain-free function at home and work. Baseline: 03/15/24: Pt continuing exercises from [facility] HEP printout (elbow AROM, wrist AROM, gripping); we updated HEP for shoulder AAROM in limited ROM and scap retractions.     04/18/24: Pt reports regular work on shoulder AROM/AAROM at home, sometimes not with specifics discussed in home program.  Goal status: ON-GOING   LONG TERM GOALS: Target date: 06/01/2024  Pt will  have R shoulder active forward elevation to 130 deg as needed for functional ROM for reaching, self-care ADLs, and household tasks Baseline: 03/15/24: R shoulder flexion 47 deg    04/18/24: R shoulder flexion AROM 87 deg Goal status: IN PROGRESS   2.  Pt will decrease worst shoulder pain by at least 3 points on the NPRS in order to demonstrate clinically significant reduction in shoulder pain. Baseline: 03/15/24: 10/10 at worst.     04/18/24: 3/10 at worst.  Goal status: ACHIEVED  3.  Pt will decrease quick DASH score by at least 8% in order to demonstrate clinically significant reduction in disability related to shoulder pain        Baseline: 03/15/24: 81.8%.    04/18/24: 52.3% Goal status: ACHIEVED  4. Pt will have MMT 4+/5 or greater for all tested shoulder muscles (deltoid, RTC, periscapular) in order to demonstrate improvement in strength and function      Baseline: 03/15/24: MMT deferred due to early post-op status.     04/18/24: Deferred until Phase II rehab.  Goal status: DEFERRED  5.  Pt will decrease quick DASH score to 25% disability or less in order to demonstrate clinically significant reduction in disability related to shoulder pain        Baseline: 04/18/24: 52.3% Goal status: INITIAL   PLAN: PT FREQUENCY: 1-2x/week  PT DURATION: 6 weeks  PLANNED INTERVENTIONS: Therapeutic exercises, Therapeutic activity, Neuromuscular re-education, Balance training, Gait training, Patient/Family education, Self Care, Joint mobilization, Joint manipulation, Vestibular training, Canalith repositioning, Orthotic/Fit training, DME instructions, Dry Needling, Electrical stimulation, Spinal manipulation, Spinal mobilization, Cryotherapy, Moist heat, Taping, Traction, Ultrasound, Ionotophoresis 4mg /ml Dexamethasone , Manual therapy, and Re-evaluation.  PLAN FOR NEXT SESSION: Continue with Phase II rehab; progress ROM as tolerated (A/AAROM), initiate A/AAROM for IR and extension as tolerated.  Progress with light resisted forward flexion, ER, and abduction as tolerated; NO resisted internal rotation/extension/scap retraction.     Huston Band, Student-PT 05/16/2024, 9:50 AM  "

## 2024-05-18 ENCOUNTER — Ambulatory Visit: Admitting: Physical Therapy

## 2024-05-18 ENCOUNTER — Encounter: Payer: Self-pay | Admitting: Physical Therapy

## 2024-05-18 DIAGNOSIS — M6281 Muscle weakness (generalized): Secondary | ICD-10-CM

## 2024-05-18 DIAGNOSIS — Z96611 Presence of right artificial shoulder joint: Secondary | ICD-10-CM

## 2024-05-18 DIAGNOSIS — M25611 Stiffness of right shoulder, not elsewhere classified: Secondary | ICD-10-CM

## 2024-05-18 NOTE — Therapy (Addendum)
 " OUTPATIENT PHYSICAL THERAPY TREATMENT   Patient Name: Joel Flores MRN: [id number] DOB:[birth year], 74 y.o., male Today's Date: 05/18/2024  END OF SESSION:  PT End of Session - 05/18/24 0947     Visit Number 17    Number of Visits 21    Date for Recertification  05/24/24    Authorization Type [employer]    PT Start Time 0854    PT Stop Time 0942    PT Time Calculation (min) 48 min    Activity Tolerance Patient tolerated treatment well    Behavior During Therapy [facility] for tasks assessed/performed             Past Medical History:  Diagnosis Date   Allergy 1990   Anginal pain    Anxiety    PANIC ATTACKS   Arthritis    right shoulder   Atherosclerosis of aorta 02/12/2015   Atherosclerotic coronary vascular disease 2008   s/p right iliac stent   Cataract    COPD (chronic obstructive pulmonary disease) (HCC)    Cough    Depression    DM (diabetes mellitus) (HCC) 03/10/2023   Dyspnea    Environmental and seasonal allergies    Essential hypertension 02/12/2015   Family history of adverse reaction to anesthesia    Father - 43 - MI, then stroke after Prostate surgery   GERD (gastroesophageal reflux disease)    History of hiatal hernia    Hyperlipidemia    Hypertension    Hypothyroidism    Incisional hernia, without obstruction or gangrene    PVD (peripheral vascular disease) 11/17/2016   Venous insufficiency    Vertigo    can happen daily   Wears hearing aid in both ears    has, does not wear   Past Surgical History:  Procedure Laterality Date   CATARACT EXTRACTION W/PHACO Right 03/16/2018   Procedure: CATARACT EXTRACTION PHACO AND INTRAOCULAR LENS PLACEMENT (IOC);  Surgeon: Ferol Rogue, MD;  Location: [facility];  Service: Ophthalmology;  Laterality: Right;  US   01:05 CDE 11.85 Fluid pack lot # [id number]   CATARACT EXTRACTION W/PHACO Left 05/08/2020   Procedure: CATARACT EXTRACTION PHACO AND INTRAOCULAR LENS PLACEMENT (IOC) LEFT 7.32 00:54.7 13.4%;   Surgeon: Mittie Gaskin, MD;  Location: [facility];  Service: Ophthalmology;  Laterality: Left;   CHOLECYSTECTOMY     COLONOSCOPY  2015   Dr Jinny- cleared for 5 years    COLONOSCOPY WITH PROPOFOL  N/A 01/17/2018   Procedure: COLONOSCOPY WITH PROPOFOL ;  Surgeon: Jinny Carmine, MD;  Location: [facility];  Service: Endoscopy;  Laterality: N/A;  requests early   HERNIA REPAIR  July 2019   INSERTION OF MESH N/A 08/31/2017   Procedure: INSERTION OF MESH;  Surgeon: Nicholaus Selinda Birmingham, MD;  Location: [facility];  Service: General;  Laterality: N/A;   IR CATHETER TUBE CHANGE  08/05/2023   IR RADIOLOGIST EVAL & MGMT  06/01/2023   IR RADIOLOGIST EVAL & MGMT  06/08/2023   IR RADIOLOGIST EVAL & MGMT  07/13/2023   IR RADIOLOGIST EVAL & MGMT  08/05/2023   IR REMOVAL OF CALCULI/DEBRIS BILIARY DUCT/GB  07/09/2023   IRRIGATION AND DEBRIDEMENT ABSCESS N/A 03/10/2023   Procedure: IRRIGATION AND DEBRIDEMENT ABDOMINAL WALL ABSCESS;  Surgeon: Jordis Laneta FALCON, MD;  Location: [facility];  Service: General;  Laterality: N/A;   POLYPECTOMY  01/17/2018   Procedure: POLYPECTOMY;  Surgeon: Jinny Carmine, MD;  Location: [facility];  Service: Endoscopy;;   UMBILICAL HERNIA REPAIR N/A 08/31/2017  Procedure: HERNIA REPAIR UMBILICAL ADULT;  Surgeon: Nicholaus Selinda Birmingham, MD;  Location: [facility];  Service: General;  Laterality: N/A;   VEIN SURGERY     1 stents in R) leg   Patient Active Problem List   Diagnosis Date Noted   Hepatic abscess 03/10/2023   Abdominal pain 03/10/2023   DM (diabetes mellitus) (HCC) 03/10/2023   Sepsis (HCC) 03/10/2023   Abdominal wall abscess 03/10/2023   Bilateral carotid artery stenosis 09/20/2019   Colon cancer screening    Polyp of sigmoid colon    SOBOE (shortness of breath on exertion) 07/30/2017   PVD (peripheral vascular disease) 11/17/2016   Tobacco use disorder 11/17/2016   Reactive airway disease, mild intermittent, uncomplicated 08/12/2016   Allergic  rhinitis due to pollen 08/13/2015   Nocturia 08/13/2015   Atherosclerosis of aorta 02/12/2015   Essential hypertension 02/12/2015   Esophageal reflux 02/12/2015   Depression 02/12/2015   Adult hypothyroidism 02/12/2015   Chronic anxiety 02/12/2015   Hyperlipemia 02/12/2015    PCP: Doretta Button, MD  REFERRING PROVIDER: Alease Oyster, MD  REFERRING DIAG: s/p R reverse total shoulder arthroplasty  RATIONALE FOR EVALUATION AND TREATMENT: Rehabilitation  THERAPY DIAG: S/P reverse total shoulder arthroplasty, right  Stiffness of right shoulder, not elsewhere classified  Muscle weakness (generalized)  ONSET DATE: 03/08/24, R rTSA  FOLLOW-UP APPT SCHEDULED WITH REFERRING PROVIDER: Yes ; 03/28/24  PERTINENT HISTORY: Pt is a 74 year old male s/p R rTSA (DOS: 03/08/24).  Pt currently reports some confusion regarding his HEP. Pt has completed given exercises from [facility], but did not understand relevance to his shoulder. Pt denies post-op complications. Patient reports some challenge with comfort at night. Pt is smoker and spends a lot of time in his shop to smoke and work on [employer] working.   PAIN:  Pain Intensity: Present: 2/10, Best: 2/10, Worst: 10/10 Pain location: Anterior arm pain Pain Quality: burning  Radiating: No  Numbness/Tingling: Yes; fleeting N/T in 4th-5th digits and thumb; this has subsided Focal Weakness: No Aggravating factors: inadvertent abduction/ER of R arm Relieving factors: None History of prior shoulder or neck/shoulder injury, pain, surgery, or therapy: Yes; chronic R shoulder pain/RTC arthropathy  Falls: Has patient fallen in last 6 months? No, Number of falls: N/A Dominant hand: right Imaging: Yes ;  - X-ray: good post-op follow-up  Red flags (personal history of cancer, chills/fever, night sweats, nausea, vomiting, unrelenting pain, unexplained weight gain/loss): Negative  PRECAUTIONS: Other: s/p R rTSA  WEIGHT BEARING RESTRICTIONS:  No  FALLS: Has patient fallen in last 6 months? 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)  Patient Goals: Able to move arm without pain    OBJECTIVE (data from initial evaluation unless otherwise dated):   Patient Surveys  QuickDASH: 81.8%  Posture Rounded shoulders, mild forward head  AROM AROM (Normal range in degrees) AROM 03/15/24 AROM 04/18/24   Right Left Right Left  Shoulder      Flexion 47 142 87   Extension      Abduction 48 168 75   External Rotation 15 66 30   Internal Rotation      Hands Behind Head      Hands Behind Back            Elbow      Flexion      Extension  Pronation      Supination      (* = pain; Blank rows = not tested)  UE MMT: MMT deferred on initial evaluation due to post-op status* MMT (out of 5) Right Left       Shoulder   Flexion    Extension    Abduction    External rotation    Internal rotation    Horizontal abduction    Horizontal adduction    Lower Trapezius    Rhomboids        Elbow  Flexion    Extension    Pronation    Supination        Wrist  Flexion    Extension    Radial deviation    Ulnar deviation        MCP  Flexion    Extension    Abduction    Adduction    (* = pain; Blank rows = not tested)  Sensation Deferred  Reflexes Deferred  Palpation TTP R ACJ (1), R long head of biceps tendon (1), R lateral deltoid (1), R anterior deltoid (1) (Blank rows = not tested) Graded on 0-4 scale (0 = no pain, 1 = pain, 2 = pain with wincing/grimacing/flinching, 3 = pain with withdrawal, 4 = unwilling to allow palpation), (Blank rows = not tested)     TODAY'S TREATMENT    05/18/2024   SUBJECTIVE STATEMENT: Patient presents to treatment with no pain or discomfort. He is mostly concerned about his active IR  ROM in surgical shoulder, he states that this motion prevents him from certain self-care tasks. Patient has been keeping up with HEP and has been doing IR stretches with towel/   Therapeutic Exercise - for shoulder ROM as needed for reaching and self-care ADLs  Upper body ergometer, 5 minutes forward - for tissue warm-up to improve muscle performance, improved soft tissue mobility/extensibility  - interval subjective gathered  PROM R shoulder- flexion, abduction, ER, IR as tolerated (pt in supine position); x 5 min  Supine shoulder flexion with Dbell; 2# 2 x 10  Reclined on wedge 50 deg elevated at head; shoulder flexion AROM; 2 x 10  Wand flexion AAROM, 55 deg elevated; 2 x 10  Wand abduction AAROM, standing; 2 x 10  Finger ladder, forward elevation and abduction; 3 x 10 each way, 5 sec hold   Pulleys for forward elevation and scaption AAROM; x 2 min  Resisted standing ER with red band 2 x 10  Towel IR stretch 3 x 30 sec  **next session** Resisted abduction with yellow band   PATIENT EDUCATION: Discussed current PT progress and expectations moving forward with PT.   *not today* Standing incline table slide, 50 deg; 2 x 10 Shoulder flexion AAROM in supine, no ROM restrictions; 20x Cold pack (unbilled) - for anti-inflammatory and analgesic effect as needed for reduced pain and improved ability to participate in active PT intervention, along R shoulder in reclined position, x 5 minutes Standing wand flexion AAROM; 1 x 10  -notable scapular elevation compensation Wand AAROM ER; 1 x 10 -reviewed and discussed allowed ROM per protocol   PATIENT EDUCATION:  Education details: see above for patient education details Person educated: Patient Education method: Explanation, Demonstration, and Handouts Education comprehension: verbalized understanding and returned demonstration   HOME EXERCISE PROGRAM:  Access Code: [email or web address] URL: [email or web address] Date:  05/04/2024 Prepared by: Venetia Endo  Exercises - Supine Shoulder Flexion AAROM with Dowel  - 2 x daily - 7 x weekly -  2 sets - 10 reps - Supine Shoulder External Rotation with Dowel  - 2 x daily - 7 x weekly - 2 sets - 10 reps - Reclined Shoulder Elevation  - 2 x daily - 7 x weekly - 2 sets - 10 reps   ASSESSMENT:  CLINICAL IMPRESSION: Patient is progressing well with PT treatment. He is showing improvement in strength; we were able to progress forward flexion isotonics with increase in weight. Today, we began AAROM in abduction with patient showing significant improvement in ROM. After session, patient reported no pain or discomfort. However, he reported tightness in the anterior capsule of shoulder. He was advised to use ice as needed, and he may feel sore after treatment. Patient has remaining deficits in shoulder AROM and strength. Patient would benefit from continued PT intervention to improve remaining deficits.    OBJECTIVE IMPAIRMENTS: decreased ROM, decreased strength, hypomobility, impaired flexibility, impaired UE functional use, postural dysfunction, and pain.   ACTIVITY LIMITATIONS: carrying, lifting, transfers, bathing, dressing, self feeding, reach over head, and hygiene/grooming  PARTICIPATION LIMITATIONS: meal prep, cleaning, laundry, driving, yard work, and working in his shop  PERSONAL FACTORS: Age, Past/current experiences, and 3+ comorbidities: (chronic tobacco use, PVD, atherosclerosis of aorta, DM, depression/anxietty, HLD) are also affecting patient's functional outcome.   REHAB POTENTIAL: Good  CLINICAL DECISION MAKING: Stable/uncomplicated  EVALUATION COMPLEXITY: Low   GOALS: Goals reviewed with patient? Yes  SHORT TERM GOALS: Target date: 04/10/2024  Pt will be independent with HEP to improve strength and decrease shoulder pain to improve pain-free function at home and work. Baseline: 03/15/24: Pt continuing exercises from [facility] HEP printout (elbow  AROM, wrist AROM, gripping); we updated HEP for shoulder AAROM in limited ROM and scap retractions.     04/18/24: Pt reports regular work on shoulder AROM/AAROM at home, sometimes not with specifics discussed in home program.  Goal status: ON-GOING   LONG TERM GOALS: Target date: 06/01/2024  Pt will have R shoulder active forward elevation to 130 deg as needed for functional ROM for reaching, self-care ADLs, and household tasks Baseline: 03/15/24: R shoulder flexion 47 deg    04/18/24: R shoulder flexion AROM 87 deg Goal status: IN PROGRESS   2.  Pt will decrease worst shoulder pain by at least 3 points on the NPRS in order to demonstrate clinically significant reduction in shoulder pain. Baseline: 03/15/24: 10/10 at worst.     04/18/24: 3/10 at worst.  Goal status: ACHIEVED  3.  Pt will decrease quick DASH score by at least 8% in order to demonstrate clinically significant reduction in disability related to shoulder pain        Baseline: 03/15/24: 81.8%.    04/18/24: 52.3% Goal status: ACHIEVED  4. Pt will have MMT 4+/5 or greater for all tested shoulder muscles (deltoid, RTC, periscapular) in order to demonstrate improvement in strength and function      Baseline: 03/15/24: MMT deferred due to early post-op status.     04/18/24: Deferred until Phase II rehab.  Goal status: DEFERRED  5.  Pt will decrease quick DASH score to 25% disability or less in order to demonstrate clinically significant reduction in disability related to shoulder pain        Baseline: 04/18/24: 52.3% Goal status: INITIAL   PLAN: PT FREQUENCY: 1-2x/week  PT DURATION: 6 weeks  PLANNED INTERVENTIONS: Therapeutic exercises, Therapeutic activity, Neuromuscular re-education, Balance training, Gait training, Patient/Family education, Self Care, Joint mobilization, Joint manipulation, Vestibular training, Canalith repositioning, Orthotic/Fit training, DME instructions, Dry  Needling, Electrical stimulation, Spinal  manipulation, Spinal mobilization, Cryotherapy, Moist heat, Taping, Traction, Ultrasound, Ionotophoresis 4mg /ml Dexamethasone , Manual therapy, and Re-evaluation.  PLAN FOR NEXT SESSION: Continue with Phase II rehab; progress ROM as tolerated (A/AAROM), initiate A/AAROM for IR and extension as tolerated. Progress with light resisted forward flexion, ER, and abduction as tolerated; NO resisted internal rotation/extension/scap retraction.     Huston Band, Student-PT 05/18/2024, 9:48 AM  "

## 2024-05-23 ENCOUNTER — Encounter: Payer: Self-pay | Admitting: Physical Therapy

## 2024-05-23 ENCOUNTER — Ambulatory Visit: Admitting: Physical Therapy

## 2024-05-23 DIAGNOSIS — M25611 Stiffness of right shoulder, not elsewhere classified: Secondary | ICD-10-CM

## 2024-05-23 DIAGNOSIS — M6281 Muscle weakness (generalized): Secondary | ICD-10-CM

## 2024-05-23 DIAGNOSIS — Z96611 Presence of right artificial shoulder joint: Secondary | ICD-10-CM

## 2024-05-23 NOTE — Therapy (Signed)
 " OUTPATIENT PHYSICAL THERAPY TREATMENT   Patient Name: Joel Flores MRN: [id number] DOB:04/03/1951, 74 y.o., male Today's Date: 05/23/2024  END OF SESSION:  PT End of Session - 05/23/24 0938     Visit Number 18    Number of Visits 21    Date for Recertification  05/24/24    Authorization Type [employer]    PT Start Time 0845    PT Stop Time 0931    PT Time Calculation (min) 46 min    Activity Tolerance Patient tolerated treatment well    Behavior During Therapy [facility] for tasks assessed/performed              Past Medical History:  Diagnosis Date   Allergy 1990   Anginal pain    Anxiety    PANIC ATTACKS   Arthritis    right shoulder   Atherosclerosis of aorta 02/12/2015   Atherosclerotic coronary vascular disease 2008   s/p right iliac stent   Cataract    COPD (chronic obstructive pulmonary disease) (HCC)    Cough    Depression    DM (diabetes mellitus) (HCC) 03/10/2023   Dyspnea    Environmental and seasonal allergies    Essential hypertension 02/12/2015   Family history of adverse reaction to anesthesia    Father - 76 - MI, then stroke after Prostate surgery   GERD (gastroesophageal reflux disease)    History of hiatal hernia    Hyperlipidemia    Hypertension    Hypothyroidism    Incisional hernia, without obstruction or gangrene    PVD (peripheral vascular disease) 11/17/2016   Venous insufficiency    Vertigo    can happen daily   Wears hearing aid in both ears    has, does not wear   Past Surgical History:  Procedure Laterality Date   CATARACT EXTRACTION W/PHACO Right 03/16/2018   Procedure: CATARACT EXTRACTION PHACO AND INTRAOCULAR LENS PLACEMENT (IOC);  Surgeon: Ferol Rogue, MD;  Location: [facility];  Service: Ophthalmology;  Laterality: Right;  US   01:05 CDE 11.85 Fluid pack lot # [id number]   CATARACT EXTRACTION W/PHACO Left 05/08/2020   Procedure: CATARACT EXTRACTION PHACO AND INTRAOCULAR LENS PLACEMENT (IOC) LEFT 7.32 00:54.7 13.4%;   Surgeon: Mittie Gaskin, MD;  Location: [facility];  Service: Ophthalmology;  Laterality: Left;   CHOLECYSTECTOMY     COLONOSCOPY  2015   Dr Jinny- cleared for 5 years    COLONOSCOPY WITH PROPOFOL  N/A 01/17/2018   Procedure: COLONOSCOPY WITH PROPOFOL ;  Surgeon: Jinny Carmine, MD;  Location: [facility];  Service: Endoscopy;  Laterality: N/A;  requests early   HERNIA REPAIR  July 2019   INSERTION OF MESH N/A 08/31/2017   Procedure: INSERTION OF MESH;  Surgeon: Nicholaus Selinda Birmingham, MD;  Location: [facility];  Service: General;  Laterality: N/A;   IR CATHETER TUBE CHANGE  08/05/2023   IR RADIOLOGIST EVAL & MGMT  06/01/2023   IR RADIOLOGIST EVAL & MGMT  06/08/2023   IR RADIOLOGIST EVAL & MGMT  07/13/2023   IR RADIOLOGIST EVAL & MGMT  08/05/2023   IR REMOVAL OF CALCULI/DEBRIS BILIARY DUCT/GB  07/09/2023   IRRIGATION AND DEBRIDEMENT ABSCESS N/A 03/10/2023   Procedure: IRRIGATION AND DEBRIDEMENT ABDOMINAL WALL ABSCESS;  Surgeon: Jordis Laneta FALCON, MD;  Location: [facility];  Service: General;  Laterality: N/A;   POLYPECTOMY  01/17/2018   Procedure: POLYPECTOMY;  Surgeon: Jinny Carmine, MD;  Location: [facility];  Service: Endoscopy;;   UMBILICAL HERNIA REPAIR N/A 08/31/2017  Procedure: HERNIA REPAIR UMBILICAL ADULT;  Surgeon: Nicholaus Selinda Birmingham, MD;  Location: [facility];  Service: General;  Laterality: N/A;   VEIN SURGERY     1 stents in R) leg   Patient Active Problem List   Diagnosis Date Noted   Hepatic abscess 03/10/2023   Abdominal pain 03/10/2023   DM (diabetes mellitus) (HCC) 03/10/2023   Sepsis (HCC) 03/10/2023   Abdominal wall abscess 03/10/2023   Bilateral carotid artery stenosis 09/20/2019   Colon cancer screening    Polyp of sigmoid colon    SOBOE (shortness of breath on exertion) 07/30/2017   PVD (peripheral vascular disease) 11/17/2016   Tobacco use disorder 11/17/2016   Reactive airway disease, mild intermittent, uncomplicated 08/12/2016   Allergic  rhinitis due to pollen 08/13/2015   Nocturia 08/13/2015   Atherosclerosis of aorta 02/12/2015   Essential hypertension 02/12/2015   Esophageal reflux 02/12/2015   Depression 02/12/2015   Adult hypothyroidism 02/12/2015   Chronic anxiety 02/12/2015   Hyperlipemia 02/12/2015    PCP: Doretta Button, MD  REFERRING PROVIDER: Alease Oyster, MD  REFERRING DIAG: s/p R reverse total shoulder arthroplasty  RATIONALE FOR EVALUATION AND TREATMENT: Rehabilitation  THERAPY DIAG: S/P reverse total shoulder arthroplasty, right  Stiffness of right shoulder, not elsewhere classified  Muscle weakness (generalized)  ONSET DATE: 03/08/24, R rTSA  FOLLOW-UP APPT SCHEDULED WITH REFERRING PROVIDER: Yes ; 03/28/24  PERTINENT HISTORY: Pt is a 74 year old male s/p R rTSA (DOS: 03/08/24).  Pt currently reports some confusion regarding his HEP. Pt has completed given exercises from [facility], but did not understand relevance to his shoulder. Pt denies post-op complications. Patient reports some challenge with comfort at night. Pt is smoker and spends a lot of time in his shop to smoke and work on [employer] working.   PAIN:  Pain Intensity: Present: 2/10, Best: 2/10, Worst: 10/10 Pain location: Anterior arm pain Pain Quality: burning  Radiating: No  Numbness/Tingling: Yes; fleeting N/T in 4th-5th digits and thumb; this has subsided Focal Weakness: No Aggravating factors: inadvertent abduction/ER of R arm Relieving factors: None History of prior shoulder or neck/shoulder injury, pain, surgery, or therapy: Yes; chronic R shoulder pain/RTC arthropathy  Falls: Has patient fallen in last 6 months? No, Number of falls: N/A Dominant hand: right Imaging: Yes ;  - X-ray: good post-op follow-up  Red flags (personal history of cancer, chills/fever, night sweats, nausea, vomiting, unrelenting pain, unexplained weight gain/loss): Negative  PRECAUTIONS: Other: s/p R rTSA  WEIGHT BEARING RESTRICTIONS:  No  FALLS: Has patient fallen in last 6 months? 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)  Patient Goals: Able to move arm without pain    OBJECTIVE (data from initial evaluation unless otherwise dated):   Patient Surveys  QuickDASH: 81.8%  Posture Rounded shoulders, mild forward head  AROM AROM (Normal range in degrees) AROM 03/15/24 AROM 04/18/24   Right Left Right Left  Shoulder      Flexion 47 142 87   Extension      Abduction 48 168 75   External Rotation 15 66 30   Internal Rotation      Hands Behind Head      Hands Behind Back            Elbow      Flexion      Extension  Pronation      Supination      (* = pain; Blank rows = not tested)  UE MMT: MMT deferred on initial evaluation due to post-op status* MMT (out of 5) Right Left       Shoulder   Flexion    Extension    Abduction    External rotation    Internal rotation    Horizontal abduction    Horizontal adduction    Lower Trapezius    Rhomboids        Elbow  Flexion    Extension    Pronation    Supination        Wrist  Flexion    Extension    Radial deviation    Ulnar deviation        MCP  Flexion    Extension    Abduction    Adduction    (* = pain; Blank rows = not tested)  Sensation Deferred  Reflexes Deferred  Palpation TTP R ACJ (1), R long head of biceps tendon (1), R lateral deltoid (1), R anterior deltoid (1) (Blank rows = not tested) Graded on 0-4 scale (0 = no pain, 1 = pain, 2 = pain with wincing/grimacing/flinching, 3 = pain with withdrawal, 4 = unwilling to allow palpation), (Blank rows = not tested)     TODAY'S TREATMENT    05/23/2024   SUBJECTIVE STATEMENT: Patient presents to physical therapy with no pain or discomfort. He reports feeling discomfort with  the IR stretch he does at home, but not pain. He reports seeing improvement in is motion, but is still frustrated with IR ROM. Reassured patient that we will keep working on remaining deficits.   Therapeutic Exercise - for shoulder ROM as needed for reaching and self-care ADLs  Upper body ergometer, 5 minutes forward - for tissue warm-up to improve muscle performance, improved soft tissue mobility/extensibility  - interval subjective gathered  PROM R shoulder- flexion, abduction, ER, IR as tolerated (pt in supine position); x 5 min  Supine shoulder flexion with Dbell; 2# 2 x 10  Reclined on wedge 55 deg elevated at head; shoulder flexion AROM; 2 x 5  Wand flexion AAROM, 55 deg elevated; 2 x 10  Wand abduction AAROM, standing; 2 x 10  Finger ladder, forward elevation and abduction; 3 x 10 each way, 5 sec hold   Pulleys for forward elevation and scaption AAROM; x 2 min  Resisted standing ER with red band 2 x 10  Standing shoulder abduction with 1# DB 2 x 10  IR stretch 3 x 30 sec  **starting 05/31/24** Resisted IR and extension   PATIENT EDUCATION: Discussed current PT progress and expectations moving forward with PT.   *not today* Standing incline table slide, 50 deg; 2 x 10 Shoulder flexion AAROM in supine, no ROM restrictions; 20x Cold pack (unbilled) - for anti-inflammatory and analgesic effect as needed for reduced pain and improved ability to participate in active PT intervention, along R shoulder in reclined position, x 5 minutes Standing wand flexion AAROM; 1 x 10  -notable scapular elevation compensation Wand AAROM ER; 1 x 10 -reviewed and discussed allowed ROM per protocol   PATIENT EDUCATION:  Education details: see above for patient education details Person educated: Patient Education method: Explanation, Demonstration, and Handouts Education comprehension: verbalized understanding and returned demonstration   HOME EXERCISE PROGRAM:  Access Code:  [email or web address] URL: [email or web address] Date: 05/04/2024 Prepared by: Venetia Endo  Exercises - Supine Shoulder Flexion AAROM with  Dowel  - 2 x daily - 7 x weekly - 2 sets - 10 reps - Supine Shoulder External Rotation with Dowel  - 2 x daily - 7 x weekly - 2 sets - 10 reps - Reclined Shoulder Elevation  - 2 x daily - 7 x weekly - 2 sets - 10 reps   ASSESSMENT:  CLINICAL IMPRESSION: Patient is progressing well with PT treatment. He is showing improvements in strength and mobility. Patient is frustrated with his inability to actively IR his shoulder. Physical therapy treatment will begin on improving strength and mobility. We will begin resisted extension and IR on February 4th, which will mark 12 weeks post op. Patient is unable to actively IR his shoulder to weakness, and this deficit will be addressed per protocol. Patient will benefit on continuing skilled physical therapy interventions to address remaining deficits.    OBJECTIVE IMPAIRMENTS: decreased ROM, decreased strength, hypomobility, impaired flexibility, impaired UE functional use, postural dysfunction, and pain.   ACTIVITY LIMITATIONS: carrying, lifting, transfers, bathing, dressing, self feeding, reach over head, and hygiene/grooming  PARTICIPATION LIMITATIONS: meal prep, cleaning, laundry, driving, yard work, and working in his shop  PERSONAL FACTORS: Age, Past/current experiences, and 3+ comorbidities: (chronic tobacco use, PVD, atherosclerosis of aorta, DM, depression/anxietty, HLD) are also affecting patient's functional outcome.   REHAB POTENTIAL: Good  CLINICAL DECISION MAKING: Stable/uncomplicated  EVALUATION COMPLEXITY: Low   GOALS: Goals reviewed with patient? Yes  SHORT TERM GOALS: Target date: 04/10/2024  Pt will be independent with HEP to improve strength and decrease shoulder pain to improve pain-free function at home and work. Baseline: 03/15/24: Pt continuing exercises from [facility] HEP  printout (elbow AROM, wrist AROM, gripping); we updated HEP for shoulder AAROM in limited ROM and scap retractions.     04/18/24: Pt reports regular work on shoulder AROM/AAROM at home, sometimes not with specifics discussed in home program.  Goal status: ON-GOING   LONG TERM GOALS: Target date: 06/01/2024  Pt will have R shoulder active forward elevation to 130 deg as needed for functional ROM for reaching, self-care ADLs, and household tasks Baseline: 03/15/24: R shoulder flexion 47 deg    04/18/24: R shoulder flexion AROM 87 deg Goal status: IN PROGRESS   2.  Pt will decrease worst shoulder pain by at least 3 points on the NPRS in order to demonstrate clinically significant reduction in shoulder pain. Baseline: 03/15/24: 10/10 at worst.     04/18/24: 3/10 at worst.  Goal status: ACHIEVED  3.  Pt will decrease quick DASH score by at least 8% in order to demonstrate clinically significant reduction in disability related to shoulder pain        Baseline: 03/15/24: 81.8%.    04/18/24: 52.3% Goal status: ACHIEVED  4. Pt will have MMT 4+/5 or greater for all tested shoulder muscles (deltoid, RTC, periscapular) in order to demonstrate improvement in strength and function      Baseline: 03/15/24: MMT deferred due to early post-op status.     04/18/24: Deferred until Phase II rehab.  Goal status: DEFERRED  5.  Pt will decrease quick DASH score to 25% disability or less in order to demonstrate clinically significant reduction in disability related to shoulder pain        Baseline: 04/18/24: 52.3% Goal status: INITIAL   PLAN: PT FREQUENCY: 1-2x/week  PT DURATION: 6 weeks  PLANNED INTERVENTIONS: Therapeutic exercises, Therapeutic activity, Neuromuscular re-education, Balance training, Gait training, Patient/Family education, Self Care, Joint mobilization, Joint manipulation, Vestibular training, Canalith repositioning, Orthotic/Fit  training, DME instructions, Dry Needling, Electrical  stimulation, Spinal manipulation, Spinal mobilization, Cryotherapy, Moist heat, Taping, Traction, Ultrasound, Ionotophoresis 4mg /ml Dexamethasone , Manual therapy, and Re-evaluation.  PLAN FOR NEXT SESSION: Continue with Phase II rehab; progress ROM as tolerated (A/AAROM), initiate A/AAROM for IR and extension as tolerated. Progress with light resisted forward flexion, ER, and abduction as tolerated; NO resisted internal rotation/extension/scap retraction.     Huston Band, Student-PT 05/23/2024, 9:39 AM  "

## 2024-05-25 ENCOUNTER — Encounter: Payer: Self-pay | Admitting: Physical Therapy

## 2024-05-25 ENCOUNTER — Ambulatory Visit: Admitting: Physical Therapy

## 2024-05-25 DIAGNOSIS — M6281 Muscle weakness (generalized): Secondary | ICD-10-CM

## 2024-05-25 DIAGNOSIS — Z96611 Presence of right artificial shoulder joint: Secondary | ICD-10-CM

## 2024-05-25 DIAGNOSIS — M25611 Stiffness of right shoulder, not elsewhere classified: Secondary | ICD-10-CM

## 2024-05-25 NOTE — Therapy (Addendum)
 " OUTPATIENT PHYSICAL THERAPY TREATMENT   Patient Name: Joel Flores MRN: [id number] DOB:[birth year], 74 y.o., male Today's Date: 05/25/2024  END OF SESSION:  PT End of Session - 05/25/24 0902     Visit Number 19    Number of Visits 21    Date for Recertification  06/07/24    Authorization Type [employer]    PT Start Time [phone]    PT Stop Time 0929    PT Time Calculation (min) 46 min    Activity Tolerance Patient tolerated treatment well    Behavior During Therapy [facility] for tasks assessed/performed               Past Medical History:  Diagnosis Date   Allergy 1990   Anginal pain    Anxiety    PANIC ATTACKS   Arthritis    right shoulder   Atherosclerosis of aorta 02/12/2015   Atherosclerotic coronary vascular disease 2008   s/p right iliac stent   Cataract    COPD (chronic obstructive pulmonary disease) (HCC)    Cough    Depression    DM (diabetes mellitus) (HCC) 03/10/2023   Dyspnea    Environmental and seasonal allergies    Essential hypertension 02/12/2015   Family history of adverse reaction to anesthesia    Father - 74 - MI, then stroke after Prostate surgery   GERD (gastroesophageal reflux disease)    History of hiatal hernia    Hyperlipidemia    Hypertension    Hypothyroidism    Incisional hernia, without obstruction or gangrene    PVD (peripheral vascular disease) 11/17/2016   Venous insufficiency    Vertigo    can happen daily   Wears hearing aid in both ears    has, does not wear   Past Surgical History:  Procedure Laterality Date   CATARACT EXTRACTION W/PHACO Right 03/16/2018   Procedure: CATARACT EXTRACTION PHACO AND INTRAOCULAR LENS PLACEMENT (IOC);  Surgeon: Ferol Rogue, MD;  Location: [facility];  Service: Ophthalmology;  Laterality: Right;  US   01:05 CDE 11.85 Fluid pack lot # [id number]   CATARACT EXTRACTION W/PHACO Left 05/08/2020   Procedure: CATARACT EXTRACTION PHACO AND INTRAOCULAR LENS PLACEMENT (IOC) LEFT 7.32 00:54.7 13.4%;   Surgeon: Mittie Gaskin, MD;  Location: [facility];  Service: Ophthalmology;  Laterality: Left;   CHOLECYSTECTOMY     COLONOSCOPY  2015   Dr Jinny- cleared for 5 years    COLONOSCOPY WITH PROPOFOL  N/A 01/17/2018   Procedure: COLONOSCOPY WITH PROPOFOL ;  Surgeon: Jinny Carmine, MD;  Location: [facility];  Service: Endoscopy;  Laterality: N/A;  requests early   HERNIA REPAIR  July 2019   INSERTION OF MESH N/A 08/31/2017   Procedure: INSERTION OF MESH;  Surgeon: Nicholaus Selinda Birmingham, MD;  Location: [facility];  Service: General;  Laterality: N/A;   IR CATHETER TUBE CHANGE  08/05/2023   IR RADIOLOGIST EVAL & MGMT  06/01/2023   IR RADIOLOGIST EVAL & MGMT  06/08/2023   IR RADIOLOGIST EVAL & MGMT  07/13/2023   IR RADIOLOGIST EVAL & MGMT  08/05/2023   IR REMOVAL OF CALCULI/DEBRIS BILIARY DUCT/GB  07/09/2023   IRRIGATION AND DEBRIDEMENT ABSCESS N/A 03/10/2023   Procedure: IRRIGATION AND DEBRIDEMENT ABDOMINAL WALL ABSCESS;  Surgeon: Jordis Laneta FALCON, MD;  Location: [facility];  Service: General;  Laterality: N/A;   POLYPECTOMY  01/17/2018   Procedure: POLYPECTOMY;  Surgeon: Jinny Carmine, MD;  Location: [facility];  Service: Endoscopy;;   UMBILICAL HERNIA REPAIR N/A 08/31/2017  Procedure: HERNIA REPAIR UMBILICAL ADULT;  Surgeon: Nicholaus Selinda Birmingham, MD;  Location: [facility];  Service: General;  Laterality: N/A;   VEIN SURGERY     1 stents in R) leg   Patient Active Problem List   Diagnosis Date Noted   Hepatic abscess 03/10/2023   Abdominal pain 03/10/2023   DM (diabetes mellitus) (HCC) 03/10/2023   Sepsis (HCC) 03/10/2023   Abdominal wall abscess 03/10/2023   Bilateral carotid artery stenosis 09/20/2019   Colon cancer screening    Polyp of sigmoid colon    SOBOE (shortness of breath on exertion) 07/30/2017   PVD (peripheral vascular disease) 11/17/2016   Tobacco use disorder 11/17/2016   Reactive airway disease, mild intermittent, uncomplicated 08/12/2016   Allergic  rhinitis due to pollen 08/13/2015   Nocturia 08/13/2015   Atherosclerosis of aorta 02/12/2015   Essential hypertension 02/12/2015   Esophageal reflux 02/12/2015   Depression 02/12/2015   Adult hypothyroidism 02/12/2015   Chronic anxiety 02/12/2015   Hyperlipemia 02/12/2015    PCP: Doretta Button, MD  REFERRING PROVIDER: Alease Oyster, MD  REFERRING DIAG: s/p R reverse total shoulder arthroplasty  RATIONALE FOR EVALUATION AND TREATMENT: Rehabilitation  THERAPY DIAG: S/P reverse total shoulder arthroplasty, right  Stiffness of right shoulder, not elsewhere classified  Muscle weakness (generalized)  ONSET DATE: 03/08/24, R rTSA  FOLLOW-UP APPT SCHEDULED WITH REFERRING PROVIDER: Yes ; 03/28/24  PERTINENT HISTORY: Pt is a 74 year old male s/p R rTSA (DOS: 03/08/24).  Pt currently reports some confusion regarding his HEP. Pt has completed given exercises from [facility], but did not understand relevance to his shoulder. Pt denies post-op complications. Patient reports some challenge with comfort at night. Pt is smoker and spends a lot of time in his shop to smoke and work on [employer] working.   PAIN:  Pain Intensity: Present: 2/10, Best: 2/10, Worst: 10/10 Pain location: Anterior arm pain Pain Quality: burning  Radiating: No  Numbness/Tingling: Yes; fleeting N/T in 4th-5th digits and thumb; this has subsided Focal Weakness: No Aggravating factors: inadvertent abduction/ER of R arm Relieving factors: None History of prior shoulder or neck/shoulder injury, pain, surgery, or therapy: Yes; chronic R shoulder pain/RTC arthropathy  Falls: Has patient fallen in last 6 months? No, Number of falls: N/A Dominant hand: right Imaging: Yes ;  - X-ray: good post-op follow-up  Red flags (personal history of cancer, chills/fever, night sweats, nausea, vomiting, unrelenting pain, unexplained weight gain/loss): Negative  PRECAUTIONS: Other: s/p R rTSA  WEIGHT BEARING RESTRICTIONS:  No  FALLS: Has patient fallen in last 6 months? 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)  Patient Goals: Able to move arm without pain    OBJECTIVE (data from initial evaluation unless otherwise dated):   Patient Surveys  QuickDASH: 81.8%  Posture Rounded shoulders, mild forward head  AROM AROM (Normal range in degrees) AROM 03/15/24 AROM 04/18/24   Right Left Right Left  Shoulder      Flexion 47 142 87   Extension      Abduction 48 168 75   External Rotation 15 66 30   Internal Rotation      Hands Behind Head      Hands Behind Back            Elbow      Flexion      Extension  Pronation      Supination      (* = pain; Blank rows = not tested)  UE MMT: MMT deferred on initial evaluation due to post-op status* MMT (out of 5) Right Left       Shoulder   Flexion    Extension    Abduction    External rotation    Internal rotation    Horizontal abduction    Horizontal adduction    Lower Trapezius    Rhomboids        Elbow  Flexion    Extension    Pronation    Supination        Wrist  Flexion    Extension    Radial deviation    Ulnar deviation        MCP  Flexion    Extension    Abduction    Adduction    (* = pain; Blank rows = not tested)  Sensation Deferred  Reflexes Deferred  Palpation TTP R ACJ (1), R long head of biceps tendon (1), R lateral deltoid (1), R anterior deltoid (1) (Blank rows = not tested) Graded on 0-4 scale (0 = no pain, 1 = pain, 2 = pain with wincing/grimacing/flinching, 3 = pain with withdrawal, 4 = unwilling to allow palpation), (Blank rows = not tested)     TODAY'S TREATMENT    05/25/2024   SUBJECTIVE STATEMENT: Patient presents to physical therapy with some complaints of soreness in the shoulder due to chipping  ice in the driveway yesterday. He reports being able to complete self-care tasks that involve IR, and is happy about that.    Manual Therapy - for symptom modulation, soft tissue sensitivity and mobility, joint mobility, ROM  With pt in supine: STM anterior and lateral deltoid; x 5 minutes   Therapeutic Exercise - for shoulder ROM as needed for reaching and self-care ADLs  Upper body ergometer, 3 minutes forward - for tissue warm-up to improve muscle performance, improved soft tissue mobility/extensibility  - interval subjective gathered  PROM R shoulder- flexion, abduction, ER, IR as tolerated (pt in supine position); x 5 min  Supine shoulder flexion with Dbell; 2# 2 x 10  Reclined on wedge 55 deg elevated at head; shoulder flexion AROM; 2 x 10  Wand flexion AAROM, standing; 2 x 10  Wand abduction AAROM, standing; 2 x 10  Shoulder flexion AAROM; backwards walking with hand fixed on tabletop; 2 x 10  Resisted standing ER with red band 2 x 10  -verbal cueing and demo to limit trunk rotation/elbow extension compensatory pattern  Standing shoulder abduction with 1# DB 2 x 10  IR stretch, self-assisted with pt grabbing R hand behind back3 x 30 sec  **starting 05/31/24** Resisted IR and extension   PATIENT EDUCATION: Discussed current PT progress and expectations moving forward with PT.   *not today* Standing incline table slide, 50 deg; 2 x 10 Shoulder flexion AAROM in supine, no ROM restrictions; 20x Cold pack (unbilled) - for anti-inflammatory and analgesic effect as needed for reduced pain and improved ability to participate in active PT intervention, along R shoulder in reclined position, x 5 minutes Standing wand flexion AAROM; 1 x 10  -notable scapular elevation compensation Wand AAROM ER; 1 x 10 -reviewed and discussed allowed ROM per protocol Finger ladder, forward elevation and abduction; 3 x 10 each way, 5 sec hold  Pulleys for forward elevation and scaption  AAROM; x 2 min   PATIENT EDUCATION:  Education details:  see above for patient education details Person educated: Patient Education method: Explanation, Demonstration, and Handouts Education comprehension: verbalized understanding and returned demonstration   HOME EXERCISE PROGRAM:  Access Code: [email or web address] URL: [email or web address] Date: 05/04/2024 Prepared by: Venetia Endo  Exercises - Supine Shoulder Flexion AAROM with Dowel  - 2 x daily - 7 x weekly - 2 sets - 10 reps - Supine Shoulder External Rotation with Dowel  - 2 x daily - 7 x weekly - 2 sets - 10 reps - Reclined Shoulder Elevation  - 2 x daily - 7 x weekly - 2 sets - 10 reps   ASSESSMENT:  CLINICAL IMPRESSION: Patient tolerated treatment well and reported some soreness in the deltoid regions. Patient is progressive very well through PT treatment. Next week we will begin working on strength deficits in IR, as this is what patient is most concerned with. Patient has remaining deficits with mobility and strength and will continue to benefit from skilled physical therapy services to address those deficits.    OBJECTIVE IMPAIRMENTS: decreased ROM, decreased strength, hypomobility, impaired flexibility, impaired UE functional use, postural dysfunction, and pain.   ACTIVITY LIMITATIONS: carrying, lifting, transfers, bathing, dressing, self feeding, reach over head, and hygiene/grooming  PARTICIPATION LIMITATIONS: meal prep, cleaning, laundry, driving, yard work, and working in his shop  PERSONAL FACTORS: Age, Past/current experiences, and 3+ comorbidities: (chronic tobacco use, PVD, atherosclerosis of aorta, DM, depression/anxietty, HLD) are also affecting patient's functional outcome.   REHAB POTENTIAL: Good  CLINICAL DECISION MAKING: Stable/uncomplicated  EVALUATION COMPLEXITY: Low   GOALS: Goals reviewed with patient? Yes  SHORT TERM GOALS: Target date: 04/10/2024  Pt will be independent with HEP  to improve strength and decrease shoulder pain to improve pain-free function at home and work. Baseline: 03/15/24: Pt continuing exercises from [facility] HEP printout (elbow AROM, wrist AROM, gripping); we updated HEP for shoulder AAROM in limited ROM and scap retractions.     04/18/24: Pt reports regular work on shoulder AROM/AAROM at home, sometimes not with specifics discussed in home program.  Goal status: ON-GOING   LONG TERM GOALS: Target date: 06/01/2024  Pt will have R shoulder active forward elevation to 130 deg as needed for functional ROM for reaching, self-care ADLs, and household tasks Baseline: 03/15/24: R shoulder flexion 47 deg    04/18/24: R shoulder flexion AROM 87 deg Goal status: IN PROGRESS   2.  Pt will decrease worst shoulder pain by at least 3 points on the NPRS in order to demonstrate clinically significant reduction in shoulder pain. Baseline: 03/15/24: 10/10 at worst.     04/18/24: 3/10 at worst.  Goal status: ACHIEVED  3.  Pt will decrease quick DASH score by at least 8% in order to demonstrate clinically significant reduction in disability related to shoulder pain        Baseline: 03/15/24: 81.8%.    04/18/24: 52.3% Goal status: ACHIEVED  4. Pt will have MMT 4+/5 or greater for all tested shoulder muscles (deltoid, RTC, periscapular) in order to demonstrate improvement in strength and function      Baseline: 03/15/24: MMT deferred due to early post-op status.     04/18/24: Deferred until Phase II rehab.  Goal status: DEFERRED  5.  Pt will decrease quick DASH score to 25% disability or less in order to demonstrate clinically significant reduction in disability related to shoulder pain        Baseline: 04/18/24: 52.3% Goal status: INITIAL   PLAN: PT FREQUENCY: 1-2x/week  PT DURATION: 6 weeks  PLANNED INTERVENTIONS: Therapeutic exercises, Therapeutic activity, Neuromuscular re-education, Balance training, Gait training, Patient/Family education, Self Care, Joint  mobilization, Joint manipulation, Vestibular training, Canalith repositioning, Orthotic/Fit training, DME instructions, Dry Needling, Electrical stimulation, Spinal manipulation, Spinal mobilization, Cryotherapy, Moist heat, Taping, Traction, Ultrasound, Ionotophoresis 4mg /ml Dexamethasone , Manual therapy, and Re-evaluation.  PLAN FOR NEXT SESSION: Continue with Phase II rehab; progress ROM as tolerated (A/AAROM), initiate A/AAROM for IR and extension as tolerated. Progress with light resisted forward flexion, ER, and abduction as tolerated; NO resisted internal rotation/extension/scap retraction.     Huston Band, Student-PT 05/25/2024, 9:47 AM  "

## 2024-06-07 ENCOUNTER — Ambulatory Visit: Admitting: Physical Therapy

## 2024-06-13 ENCOUNTER — Ambulatory Visit: Admitting: Physical Therapy

## 2024-06-15 ENCOUNTER — Ambulatory Visit: Admitting: Physical Therapy

## 2024-09-19 ENCOUNTER — Encounter (INDEPENDENT_AMBULATORY_CARE_PROVIDER_SITE_OTHER)

## 2024-09-19 ENCOUNTER — Ambulatory Visit (INDEPENDENT_AMBULATORY_CARE_PROVIDER_SITE_OTHER): Admitting: Vascular Surgery
